# Patient Record
Sex: Female | Born: 1990 | Race: White | Hispanic: No | Marital: Married | Smoking: Never smoker
Health system: Southern US, Community
[De-identification: ages and names within clinical notes are randomized; demographics above are authoritative.]

## PROBLEM LIST (undated history)

## (undated) DIAGNOSIS — I10 Essential (primary) hypertension: Secondary | ICD-10-CM

## (undated) DIAGNOSIS — F429 Obsessive-compulsive disorder, unspecified: Secondary | ICD-10-CM

## (undated) DIAGNOSIS — I701 Atherosclerosis of renal artery: Secondary | ICD-10-CM

## (undated) DIAGNOSIS — J45909 Unspecified asthma, uncomplicated: Secondary | ICD-10-CM

## (undated) DIAGNOSIS — G43909 Migraine, unspecified, not intractable, without status migrainosus: Secondary | ICD-10-CM

## (undated) DIAGNOSIS — E079 Disorder of thyroid, unspecified: Secondary | ICD-10-CM

## (undated) DIAGNOSIS — R451 Restlessness and agitation: Secondary | ICD-10-CM

## (undated) DIAGNOSIS — E119 Type 2 diabetes mellitus without complications: Secondary | ICD-10-CM

## (undated) DIAGNOSIS — F4010 Social phobia, unspecified: Secondary | ICD-10-CM

## (undated) DIAGNOSIS — F4310 Post-traumatic stress disorder, unspecified: Secondary | ICD-10-CM

## (undated) DIAGNOSIS — N261 Atrophy of kidney (terminal): Secondary | ICD-10-CM

## (undated) DIAGNOSIS — E7800 Pure hypercholesterolemia, unspecified: Secondary | ICD-10-CM

## (undated) DIAGNOSIS — F329 Major depressive disorder, single episode, unspecified: Secondary | ICD-10-CM

## (undated) DIAGNOSIS — F32A Depression, unspecified: Secondary | ICD-10-CM

## (undated) DIAGNOSIS — T7840XA Allergy, unspecified, initial encounter: Secondary | ICD-10-CM

## (undated) DIAGNOSIS — N189 Chronic kidney disease, unspecified: Secondary | ICD-10-CM

## (undated) DIAGNOSIS — E039 Hypothyroidism, unspecified: Secondary | ICD-10-CM

## (undated) DIAGNOSIS — F419 Anxiety disorder, unspecified: Secondary | ICD-10-CM

## (undated) HISTORY — DX: Pure hypercholesterolemia, unspecified: E78.00

## (undated) HISTORY — DX: Type 2 diabetes mellitus without complications: E11.9

## (undated) HISTORY — PX: LAPAROSCOPIC REPAIR AND REMOVAL OF GASTRIC BAND: SHX5919

## (undated) HISTORY — DX: Restlessness and agitation: R45.1

## (undated) HISTORY — DX: Migraine, unspecified, not intractable, without status migrainosus: G43.909

## (undated) HISTORY — DX: Social phobia, unspecified: F40.10

## (undated) HISTORY — DX: Obsessive-compulsive disorder, unspecified: F42.9

## (undated) HISTORY — PX: TONSILLECTOMY: SUR1361

## (undated) HISTORY — PX: LAPAROSCOPIC GASTRIC BANDING: SHX1100

## (undated) HISTORY — DX: Post-traumatic stress disorder, unspecified: F43.10

## (undated) HISTORY — PX: CHOLECYSTECTOMY: SHX55

## (undated) HISTORY — DX: Hypothyroidism, unspecified: E03.9

## (undated) HISTORY — DX: Allergy, unspecified, initial encounter: T78.40XA

## (undated) HISTORY — PX: WISDOM TOOTH EXTRACTION: SHX21

## (undated) HISTORY — DX: Chronic kidney disease, unspecified: N18.9

---
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
## 2009-09-01 HISTORY — PX: LAPAROSCOPIC REPAIR AND REMOVAL OF GASTRIC BAND: SHX5919

## 2009-09-01 HISTORY — PX: OTHER SURGICAL HISTORY: SHX169

## 2013-01-22 DIAGNOSIS — R21 Rash and other nonspecific skin eruption: Secondary | ICD-10-CM | POA: Insufficient documentation

## 2013-01-22 DIAGNOSIS — R197 Diarrhea, unspecified: Secondary | ICD-10-CM | POA: Insufficient documentation

## 2013-01-22 DIAGNOSIS — R109 Unspecified abdominal pain: Secondary | ICD-10-CM | POA: Insufficient documentation

## 2015-09-27 DIAGNOSIS — E876 Hypokalemia: Secondary | ICD-10-CM | POA: Insufficient documentation

## 2015-09-27 DIAGNOSIS — E1159 Type 2 diabetes mellitus with other circulatory complications: Secondary | ICD-10-CM | POA: Insufficient documentation

## 2015-09-27 DIAGNOSIS — J189 Pneumonia, unspecified organism: Secondary | ICD-10-CM | POA: Insufficient documentation

## 2015-09-27 DIAGNOSIS — R0902 Hypoxemia: Secondary | ICD-10-CM | POA: Insufficient documentation

## 2015-09-27 DIAGNOSIS — A419 Sepsis, unspecified organism: Secondary | ICD-10-CM | POA: Insufficient documentation

## 2015-09-27 DIAGNOSIS — I152 Hypertension secondary to endocrine disorders: Secondary | ICD-10-CM | POA: Insufficient documentation

## 2015-09-28 DIAGNOSIS — J45901 Unspecified asthma with (acute) exacerbation: Secondary | ICD-10-CM | POA: Insufficient documentation

## 2016-04-11 ENCOUNTER — Emergency Department
Admission: EM | Admit: 2016-04-11 | Discharge: 2016-04-11 | Disposition: A | Discharge status: Home / Self Care | Payer: Self-pay | Attending: Student | Admitting: Student

## 2016-04-11 ENCOUNTER — Encounter: Payer: Self-pay | Admitting: Emergency Medicine

## 2016-04-11 ENCOUNTER — Emergency Department: Payer: Self-pay

## 2016-04-11 DIAGNOSIS — R0789 Other chest pain: Secondary | ICD-10-CM

## 2016-04-11 DIAGNOSIS — R059 Cough, unspecified: Secondary | ICD-10-CM

## 2016-04-11 DIAGNOSIS — M940 Chondrocostal junction syndrome [Tietze]: Secondary | ICD-10-CM | POA: Insufficient documentation

## 2016-04-11 DIAGNOSIS — J45909 Unspecified asthma, uncomplicated: Secondary | ICD-10-CM | POA: Insufficient documentation

## 2016-04-11 DIAGNOSIS — I10 Essential (primary) hypertension: Secondary | ICD-10-CM | POA: Insufficient documentation

## 2016-04-11 DIAGNOSIS — R05 Cough: Secondary | ICD-10-CM

## 2016-04-11 HISTORY — DX: Unspecified asthma, uncomplicated: J45.909

## 2016-04-11 HISTORY — DX: Essential (primary) hypertension: I10

## 2016-04-11 HISTORY — DX: Disorder of thyroid, unspecified: E07.9

## 2016-04-11 LAB — BASIC METABOLIC PANEL
Anion gap: 9 (ref 5–15)
BUN: 16 mg/dL (ref 6–20)
CO2: 26 mmol/L (ref 22–32)
CREATININE: 0.79 mg/dL (ref 0.44–1.00)
Calcium: 9.8 mg/dL (ref 8.9–10.3)
Chloride: 103 mmol/L (ref 101–111)
Glucose, Bld: 120 mg/dL — ABNORMAL HIGH (ref 65–99)
POTASSIUM: 3.7 mmol/L (ref 3.5–5.1)
SODIUM: 138 mmol/L (ref 135–145)

## 2016-04-11 LAB — CBC
HCT: 40.2 % (ref 35.0–47.0)
Hemoglobin: 13.8 g/dL (ref 12.0–16.0)
MCH: 27.7 pg (ref 26.0–34.0)
MCHC: 34.4 g/dL (ref 32.0–36.0)
MCV: 80.6 fL (ref 80.0–100.0)
PLATELETS: 268 10*3/uL (ref 150–440)
RBC: 4.99 MIL/uL (ref 3.80–5.20)
RDW: 13.7 % (ref 11.5–14.5)
WBC: 8.4 10*3/uL (ref 3.6–11.0)

## 2016-04-11 LAB — POCT PREGNANCY, URINE: PREG TEST UR: NEGATIVE

## 2016-04-11 LAB — TROPONIN I: Troponin I: <0.03 ng/mL (ref <0.03)

## 2016-04-11 MED ORDER — IBUPROFEN 400 MG PO TABS
600.0000 mg | ORAL_TABLET | Freq: Once | ORAL | Status: AC
  Administered 2016-04-11: 600 mg via ORAL
  Filled 2016-04-11: qty 2

## 2016-04-11 MED ORDER — IBUPROFEN 600 MG PO TABS: 600.0000 mg | ORAL_TABLET | Freq: Three times a day (TID) | ORAL | 0 refills | Status: DC | PRN

## 2016-04-11 MED ORDER — ASPIRIN 81 MG PO CHEW
CHEWABLE_TABLET | ORAL | Status: AC
  Filled 2016-04-11: qty 4

## 2016-04-11 MED ORDER — ASPIRIN 81 MG PO CHEW
324.0000 mg | CHEWABLE_TABLET | Freq: Once | ORAL | Status: AC
  Administered 2016-04-11: 324 mg via ORAL

## 2016-04-11 NOTE — ED Triage Notes (Signed)
Pt reports centralized chest pain that started around 1300. Pt had hx of asthma, reports she used her inhaler but feels like her chest is tight.

## 2016-04-11 NOTE — ED Notes (Signed)
Patient in radiology.  Radiology informed to bring patient to room 34 when completed x-ray

## 2016-04-11 NOTE — ED Provider Notes (Signed)
[facility] Emergency Department Provider Note   ____________________________________________   First MD Initiated Contact with Patient 04/11/16 1722     (approximate)  I have reviewed the triage vital signs and the nursing notes.   HISTORY  Chief Complaint Chest Pain and Shortness of Breath    HPI Leah Bright is a 25 y.o. female with history of asthma, hypertension, thyroid disease presents for evaluation of constant aching sternal chest pain since 1 PM today, gradual, worse with deep inspiration, moderate, not worse with exertion, nonradiating. Patient reports that the pain began while she was "stressing over finances". She has had cough but it has not been productive, she denies shortness of breath. No fevers or chills. She denies any history of PE or DVT, no exogenous estrogen use, no recent surgeries, no recent prolonged period of immobilization, no hemoptysis. She denies any history of heart disease, she denies family history of early coronary artery disease.   Past Medical History:  Diagnosis Date  . Asthma   . Hypertension   . Thyroid disease     There are no active problems to display for this patient.   No past surgical history on file.  Prior to Admission medications   Not on File    Allergies Codeine; Pepto-bismol [bismuth]; and Toradol [ketorolac tromethamine]  No family history on file.  Social History Social History  Substance Use Topics  . Smoking status: Not on file  . Smokeless tobacco: Not on file  . Alcohol use Not on file    Review of Systems Constitutional: No fever/chills Eyes: No visual changes. ENT: No sore throat. Cardiovascular: + chest pain. Respiratory: Denies shortness of breath. Gastrointestinal: No abdominal pain.  No nausea, no vomiting.  No diarrhea.  No constipation. Genitourinary: Negative for dysuria. Musculoskeletal: Negative for back pain. Skin: Negative for rash. Neurological: Negative for  headaches, focal weakness or numbness.  10-point ROS otherwise negative.  ____________________________________________   PHYSICAL EXAM:  Vitals:   04/11/16 1652 04/11/16 1654 04/11/16 1733  BP:  (!) 159/87 (!) 155/90  Pulse:  84 84  Resp:  20 20  Temp:  98.6 F (37 C)   TempSrc:  Oral   SpO2:  99% 99%  Weight: 250 lb (113.4 kg)    Height:  (1.626 m)      VITAL SIGNS: ED Triage Vitals  Enc Vitals Group     BP 04/11/16 1654 (!) 159/87     Pulse Rate 04/11/16 1654 84     Resp 04/11/16 1654 20     Temp 04/11/16 1654 98.6 F (37 C)     Temp Source 04/11/16 1654 Oral     SpO2 04/11/16 1654 99 %     Weight 04/11/16 1652 250 lb (113.4 kg)     Height 04/11/16 1652  (1.626 m)     Head Circumference --      Peak Flow --      Pain Score 04/11/16 1652 7     Pain Loc --      Pain Edu? --      Excl. in GC? --     Constitutional: Alert and oriented. Well appearing and in no acute distress. Eyes: Conjunctivae are normal. PERRL. EOMI. Head: Atraumatic. Nose: No congestion/rhinnorhea. Mouth/Throat: Mucous membranes are moist.  Oropharynx non-erythematous. Neck: No stridor.  Cardiovascular: Normal rate, regular rhythm. Grossly normal heart sounds.  Good peripheral circulation. Respiratory: Normal respiratory effort.  No retractions. Lungs CTAB. Gastrointestinal: Soft and nontender. No  distention.  No CVA tenderness. Genitourinary: deferred Musculoskeletal: No lower extremity tenderness nor edema.  No joint effusions. Tenderness to palpation throughout the sternum as well as adjacent to the sternum bilaterally, palpation reproduces her pain as does her engagement of the right pectoralis muscles. Neurologic:  Normal speech and language. No gross focal neurologic deficits are appreciated. No gait instability. Skin:  Skin is warm, dry and intact. No rash noted. Psychiatric: Mood and affect are normal. Speech and behavior are  normal.  ____________________________________________   LABS (all labs ordered are listed, but only abnormal results are displayed)  Labs Reviewed  BASIC METABOLIC PANEL - Abnormal; Notable for the following:       Result Value   Glucose, Bld 120 (*)    All other components within normal limits  CBC  TROPONIN I  POCT PREGNANCY, URINE  POC URINE PREG, ED   ____________________________________________  EKG  ED ECG REPORT I, Gayla DossGayle, Lexie Koehl A, the attending physician, personally viewed and interpreted this ECG.   Date: 04/11/2016  EKG Time: 16:53  Rate: 80  Rhythm: normal EKG, normal sinus rhythm  Axis: normal  Intervals:none  ST&T Change: No acute ST elevation or acute ST depression.  ____________________________________________  RADIOLOGY  CXR IMPRESSION: No active cardiopulmonary disease. ____________________________________________   PROCEDURES  Procedure(s) performed: None  Procedures  Critical Care performed: No  ____________________________________________   INITIAL IMPRESSION / ASSESSMENT AND PLAN / ED COURSE  Pertinent labs & imaging results that were available during my care of the patient were reviewed by me and considered in my medical decision making (see chart for details).  Leah CopasKelli Vess is a 25 y.o. female with history of asthma, hypertension, thyroid disease presents for evaluation of constant aching sternal chest pain since 1 PM today, gradual, worse with deep inspiration. On exam, she is very well-appearing and in no acute distress. Vital signs stable, she is afebrile. She has reproducible sternal chest pain worse with palpation as well as engagement of the right pectoralis muscles and I suspect musculoskeletal chest pain, possibly costochondritis suspect there is also an anxiety component. Her EKG is normal which is reassuring, not consistent with ischemia. Troponin negative and pain has been constant for 5 hours. Heart score is 1 and I doubt that  this represent ACS. She is PERC negative and I doubt PE. Pain Not severe, not maximal at onset, not ripping or tearing in nature, not consistent with acute aortic dissection. CBC, BMP unremarkable, negative pregnancy test. Chest x-ray clear. We discussed return precautions, treatment with NSAIDs, need for close PCP follow-up and she is comfortable with the discharge plan. DC home.  Clinical Course     ____________________________________________   FINAL CLINICAL IMPRESSION(S) / ED DIAGNOSES  Final diagnoses:  Chest wall pain  Cough  Costochondritis      NEW MEDICATIONS STARTED DURING THIS VISIT:  New Prescriptions   No medications on file     Note:  This document was prepared using Dragon voice recognition software and may include unintentional dictation errors.    Gayla DossEryka A Vittoria Noreen, MD 04/11/16 [phone]

## 2016-04-20 ENCOUNTER — Encounter: Payer: Self-pay | Admitting: Emergency Medicine

## 2016-04-20 ENCOUNTER — Emergency Department
Admission: EM | Admit: 2016-04-20 | Discharge: 2016-04-20 | Disposition: A | Discharge status: Home / Self Care | Payer: Self-pay | Attending: Emergency Medicine | Admitting: Emergency Medicine

## 2016-04-20 DIAGNOSIS — G43909 Migraine, unspecified, not intractable, without status migrainosus: Secondary | ICD-10-CM | POA: Insufficient documentation

## 2016-04-20 DIAGNOSIS — I10 Essential (primary) hypertension: Secondary | ICD-10-CM | POA: Insufficient documentation

## 2016-04-20 DIAGNOSIS — J45909 Unspecified asthma, uncomplicated: Secondary | ICD-10-CM | POA: Insufficient documentation

## 2016-04-20 MED ORDER — BUTALBITAL-APAP-CAFFEINE 50-325-40 MG PO TABS: 1.0000 | ORAL_TABLET | Freq: Four times a day (QID) | ORAL | 0 refills | Status: AC | PRN

## 2016-04-20 MED ORDER — SODIUM CHLORIDE 0.9 % IV SOLN
1000.0000 mL | Freq: Once | INTRAVENOUS | Status: AC
  Administered 2016-04-20: 1000 mL via INTRAVENOUS

## 2016-04-20 MED ORDER — DIPHENHYDRAMINE HCL 50 MG/ML IJ SOLN
INTRAMUSCULAR | Status: AC
  Administered 2016-04-20: 25 mg via INTRAVENOUS
  Filled 2016-04-20: qty 1

## 2016-04-20 MED ORDER — METOCLOPRAMIDE HCL 5 MG/ML IJ SOLN
20.0000 mg | Freq: Once | INTRAVENOUS | Status: AC
  Administered 2016-04-20: 20 mg via INTRAVENOUS
  Filled 2016-04-20: qty 4

## 2016-04-20 MED ORDER — DIPHENHYDRAMINE HCL 50 MG/ML IJ SOLN
25.0000 mg | Freq: Once | INTRAMUSCULAR | Status: AC
  Administered 2016-04-20: 25 mg via INTRAVENOUS

## 2016-04-20 NOTE — ED Triage Notes (Signed)
Patient reports that she developed a migraine on Thursday. Patient states that she has been taking Excedrin migraine with no relief.

## 2016-04-20 NOTE — ED Provider Notes (Signed)
[facility] Emergency Department Provider Note   ____________________________________________    I have reviewed the triage vital signs and the nursing notes.   HISTORY  Chief Complaint Migraine     HPI Leah Bright is a 25 y.o. female who presents with complaints of headache. Patient reports a history of migraine headaches and today's episode is similar. She has been taking Excedrin without relief. This started 3 days ago. This is typical of her headaches. She denies neuro deficits. She has been having migraines over the last year, neurology follow-up has been arranged. She has had normal CT scan in the past. No fevers or chills or neck pain.   Past Medical History:  Diagnosis Date  . Asthma   . Hypertension   . Thyroid disease     There are no active problems to display for this patient.   Past Surgical History:  Procedure Laterality Date  . CHOLECYSTECTOMY    . LAPAROSCOPIC GASTRIC BANDING    . LAPAROSCOPIC REPAIR AND REMOVAL OF GASTRIC BAND    . TONSILLECTOMY      Prior to Admission medications   Medication Sig Start Date End Date Taking? Authorizing Provider  ibuprofen (ADVIL,MOTRIN) 600 MG tablet Take 1 tablet (600 mg total) by mouth every 8 (eight) hours as needed for moderate pain. 04/11/16   Gayla DossEryka A Gayle, MD     Allergies Codeine; Pepto-bismol [bismuth]; and Toradol [ketorolac tromethamine]  No family history on file.  Social History Social History  Substance Use Topics  . Smoking status: Never Smoker  . Smokeless tobacco: Never Used  . Alcohol use Yes     Comment: occasionally    Review of Systems  Constitutional: No fever/chills Eyes: No visual changes.  ENT: No Neck pain Cardiovascular: Denies chest pain. Respiratory: Denies shortness of breath. Gastrointestinal: Mild nausea Genitourinary: Negative for dysuria. Musculoskeletal: Negative for back pain. Skin: Negative for rash. Neurological: Negative for focal  weakness weakness  10-point ROS otherwise negative.  ____________________________________________   PHYSICAL EXAM:  VITAL SIGNS: ED Triage Vitals  Enc Vitals Group     BP 04/20/16 2113 (!) 150/85     Pulse Rate 04/20/16 2113 77     Resp 04/20/16 2113 18     Temp 04/20/16 2113 98.3 F (36.8 C)     Temp Source 04/20/16 2113 Oral     SpO2 04/20/16 2113 98 %     Weight 04/20/16 2114 270 lb (122.5 kg)     Height 04/20/16 2114 5\' 5"  (1.651 m)     Head Circumference --      Peak Flow --      Pain Score 04/20/16 2117 9     Pain Loc --      Pain Edu? --      Excl. in GC? --     Constitutional: Alert and oriented. No acute distress. Uncomfortable appearing Eyes: Conjunctivae are normal. EOMI, PERRLA Head: Atraumatic. Nose: No congestion/rhinnorhea. Mouth/Throat: Mucous membranes are moist.   Neck:  Painless ROM, no vertebral tenderness palpation Cardiovascular: Normal rate, regular rhythm. Grossly normal heart sounds.  Good peripheral circulation. Respiratory: Normal respiratory effort.  No retractions. Lungs CTAB. Gastrointestinal: Soft and nontender. No distention.  No CVA tenderness. Genitourinary: deferred Musculoskeletal: No lower extremity tenderness nor edema.  Warm and well perfused Neurologic:  Normal speech and language. No gross focal neurologic deficits are appreciated.  Skin:  Skin is warm, dry and intact. No rash noted. Psychiatric: Mood and affect are normal. Speech  and behavior are normal.  ____________________________________________   LABS (all labs ordered are listed, but only abnormal results are displayed)  Labs Reviewed - No data to display ____________________________________________  EKG  None ____________________________________________  RADIOLOGY  None ____________________________________________   PROCEDURES  Procedure(s) performed: No    Critical Care performed: No ____________________________________________   INITIAL  IMPRESSION / ASSESSMENT AND PLAN / ED COURSE  Pertinent labs & imaging results that were available during my care of the patient were reviewed by me and considered in my medical decision making (see chart for details).  Patient presents with her typical migraine headache. We will treat with IV Reglan and IV Benadryl. She is apparently allergic to Toradol. IV fluids started as well. Do not feel imaging is necessary at this time as this is consistent with her usual symptoms.  Clinical Course  ----------------------------------------- 10:24 PM on 04/20/2016 -----------------------------------------  Patient reports headache is "much better" she is anxious to go home and get in bed. We discussed return precautions. ____________________________________________   FINAL CLINICAL IMPRESSION(S) / ED DIAGNOSES  Final diagnoses:  Migraine without status migrainosus, not intractable, unspecified migraine type      NEW MEDICATIONS STARTED DURING THIS VISIT:  New Prescriptions   No medications on file     Note:  This document was prepared using Dragon voice recognition software and may include unintentional dictation errors.    Jene Everyobert Fortune Brannigan, MD 04/20/16 2225

## 2016-05-15 ENCOUNTER — Emergency Department: Payer: Self-pay

## 2016-05-15 ENCOUNTER — Emergency Department
Admission: EM | Admit: 2016-05-15 | Discharge: 2016-05-15 | Disposition: A | Discharge status: Home / Self Care | Payer: Self-pay | Attending: Emergency Medicine | Admitting: Emergency Medicine

## 2016-05-15 ENCOUNTER — Encounter: Payer: Self-pay | Admitting: Emergency Medicine

## 2016-05-15 DIAGNOSIS — J45909 Unspecified asthma, uncomplicated: Secondary | ICD-10-CM | POA: Insufficient documentation

## 2016-05-15 DIAGNOSIS — I10 Essential (primary) hypertension: Secondary | ICD-10-CM | POA: Insufficient documentation

## 2016-05-15 DIAGNOSIS — J069 Acute upper respiratory infection, unspecified: Secondary | ICD-10-CM | POA: Insufficient documentation

## 2016-05-15 DIAGNOSIS — Z79899 Other long term (current) drug therapy: Secondary | ICD-10-CM | POA: Insufficient documentation

## 2016-05-15 HISTORY — DX: Atherosclerosis of renal artery: I70.1

## 2016-05-15 LAB — BASIC METABOLIC PANEL
ANION GAP: 6 (ref 5–15)
BUN: 9 mg/dL (ref 6–20)
CALCIUM: 9.2 mg/dL (ref 8.9–10.3)
CO2: 27 mmol/L (ref 22–32)
Chloride: 109 mmol/L (ref 101–111)
Creatinine, Ser: 0.7 mg/dL (ref 0.44–1.00)
GLUCOSE: 149 mg/dL — AB (ref 65–99)
POTASSIUM: 3.8 mmol/L (ref 3.5–5.1)
SODIUM: 142 mmol/L (ref 135–145)

## 2016-05-15 LAB — CBC
HCT: 37.2 % (ref 35.0–47.0)
HEMOGLOBIN: 13 g/dL (ref 12.0–16.0)
MCH: 28.2 pg (ref 26.0–34.0)
MCHC: 34.9 g/dL (ref 32.0–36.0)
MCV: 80.7 fL (ref 80.0–100.0)
Platelets: 208 10*3/uL (ref 150–440)
RBC: 4.61 MIL/uL (ref 3.80–5.20)
RDW: 14.3 % (ref 11.5–14.5)
WBC: 4.1 10*3/uL (ref 3.6–11.0)

## 2016-05-15 LAB — TROPONIN I

## 2016-05-15 LAB — POCT PREGNANCY, URINE: PREG TEST UR: NEGATIVE

## 2016-05-15 LAB — POCT RAPID STREP A: Streptococcus, Group A Screen (Direct): NEGATIVE

## 2016-05-15 MED ORDER — DEXAMETHASONE SODIUM PHOSPHATE 10 MG/ML IJ SOLN
10.0000 mg | Freq: Once | INTRAMUSCULAR | Status: AC
  Administered 2016-05-15: 10 mg via INTRAMUSCULAR
  Filled 2016-05-15: qty 1

## 2016-05-15 MED ORDER — IPRATROPIUM-ALBUTEROL 0.5-2.5 (3) MG/3ML IN SOLN
3.0000 mL | Freq: Once | RESPIRATORY_TRACT | Status: AC
  Administered 2016-05-15: 3 mL via RESPIRATORY_TRACT
  Filled 2016-05-15: qty 3

## 2016-05-15 MED ORDER — AZITHROMYCIN 250 MG PO TABS: ORAL_TABLET | ORAL | 0 refills | Status: AC

## 2016-05-15 MED ORDER — ALBUTEROL SULFATE (2.5 MG/3ML) 0.083% IN NEBU: 2.5000 mg | INHALATION_SOLUTION | Freq: Four times a day (QID) | RESPIRATORY_TRACT | 12 refills | Status: DC | PRN

## 2016-05-15 NOTE — ED Triage Notes (Signed)
Pt presents to ED with c/o cough, chest pain, sore throat. Pt states has been going on for 2-3 days at this time. Pt is alert and oriented, noted to have a dry, nonproductive cough at this time. Breath sounds clear at this time.

## 2016-05-15 NOTE — Discharge Instructions (Signed)
Please seek medical attention for any high fevers, chest pain, shortness of breath, change in behavior, persistent vomiting, bloody stool or any other new or concerning symptoms.  

## 2016-05-15 NOTE — ED Notes (Signed)
Discharge instructions reviewed with patient. Questions fielded by this RN. Patient verbalizes understanding of instructions. Patient discharged home in stable condition per Goodman MD . No acute distress noted at time of discharge.   

## 2016-05-15 NOTE — ED Notes (Signed)
Pt states fever cough sob since Tuesday and cp started today. Pt is breathing even and unlabored. Pt lungs clear. Will continue to monitor.

## 2016-05-15 NOTE — ED Provider Notes (Signed)
[facility] Emergency Department Provider Note   ____________________________________________   I have reviewed the triage vital signs and the nursing notes.   HISTORY  Chief Complaint Cough; Chest Pain; and Sore Throat   History limited by: Not Limited   HPI Leah CopasKelli Vess is a 25 y.o. female who presents to the emergency department today with complaints of sore throat, shortness breath and cough.The symptoms have been present for the past few days. She states her throat feels like she swallowed a bunch of knives. Pain is sharp. She additionally has had a dry nonproductive cough and shortness of breath. The patient feels like she might have had some fevers. She has tried over-the-counter cough and cold medication without great relief.   Past Medical History:  Diagnosis Date  . Asthma   . Hypertension   . Renal artery stenosis (HCC)   . Thyroid disease     There are no active problems to display for this patient.   Past Surgical History:  Procedure Laterality Date  . CHOLECYSTECTOMY    . LAPAROSCOPIC GASTRIC BANDING    . LAPAROSCOPIC REPAIR AND REMOVAL OF GASTRIC BAND    . TONSILLECTOMY      Prior to Admission medications   Medication Sig Start Date End Date Taking? Authorizing Provider  butalbital-acetaminophen-caffeine (FIORICET) [phone] MG tablet Take 1-2 tablets by mouth every 6 (six) hours as needed for headache. 04/20/16 04/20/17  Jene Everyobert Kinner, MD  ibuprofen (ADVIL,MOTRIN) 600 MG tablet Take 1 tablet (600 mg total) by mouth every 8 (eight) hours as needed for moderate pain. 04/11/16   Gayla DossEryka A Gayle, MD    Allergies Codeine; Pepto-bismol [bismuth]; and Toradol [ketorolac tromethamine]  No family history on file.  Social History Social History  Substance Use Topics  . Smoking status: Never Smoker  . Smokeless tobacco: Never Used  . Alcohol use Yes     Comment: occasionally    Review of Systems  Constitutional: Negative for  fever. Cardiovascular: Negative for chest pain. Respiratory: Positive for shortness of breath. Gastrointestinal: Negative for abdominal pain, vomiting and diarrhea. Genitourinary: Negative for dysuria. Musculoskeletal: Negative for back pain. Skin: Negative for rash. Neurological: Negative for headaches, focal weakness or numbness.  10-point ROS otherwise negative.  ____________________________________________   PHYSICAL EXAM:  VITAL SIGNS: ED Triage Vitals  Enc Vitals Group     BP 05/15/16 1732 (!) 153/93     Pulse Rate 05/15/16 1732 78     Resp 05/15/16 1732 19     Temp 05/15/16 1732 99 F (37.2 C)     Temp Source 05/15/16 1732 Oral     SpO2 05/15/16 1732 98 %     Weight 05/15/16 1738 268 lb (121.6 kg)     Height 05/15/16 1738 5\' 3"  (1.6 m)     Head Circumference --      Peak Flow --      Pain Score 05/15/16 1746 7   Constitutional: Alert and oriented. Well appearing and in no distress. Eyes: Conjunctivae are normal. Normal extraocular movements. ENT   Head: Normocephalic and atraumatic.   Nose: No congestion/rhinnorhea.   Mouth/Throat: Mucous membranes are moist. Mild pharyngeal erythema without any exudates or swelling appreciated.    Neck: No stridor. Hematological/Lymphatic/Immunilogical: No cervical lymphadenopathy. Cardiovascular: Normal rate, regular rhythm.  No murmurs, rubs, or gallops. Respiratory: Normal respiratory effort without tachypnea nor retractions. Breath sounds are clear and equal bilaterally. No wheezes/rales/rhonchi. Gastrointestinal: Soft and nontender. No distention.  Genitourinary: Deferred Musculoskeletal: Normal range of motion  in all extremities. No lower extremity edema. Neurologic:  Normal speech and language. No gross focal neurologic deficits are appreciated.  Skin:  Skin is warm, dry and intact. No rash noted. Psychiatric: Mood and affect are normal. Speech and behavior are normal. Patient exhibits appropriate insight and  judgment.  ____________________________________________    LABS (pertinent positives/negatives)  Labs Reviewed  BASIC METABOLIC PANEL - Abnormal; Notable for the following:       Result Value   Glucose, Bld 149 (*)    All other components within normal limits  CBC  TROPONIN I  POCT RAPID STREP A  POCT PREGNANCY, URINE    ____________________________________________   EKG  I, Phineas Semen, attending physician, personally viewed and interpreted this EKG  EKG Time: 1737 Rate: 79 Rhythm: normal sinus rhythm Axis: normal Intervals: qtc 428 QRS: narrow ST changes: no st elevation Impression: normal ekg   ____________________________________________    RADIOLOGY  CXR IMPRESSION:  No active cardiopulmonary disease.     I, Abigayle Wilinski, personally viewed and evaluated these images (plain radiographs) as part of my medical decision making. ____________________________________________   PROCEDURES  Procedures  ____________________________________________   INITIAL IMPRESSION / ASSESSMENT AND PLAN / ED COURSE  Pertinent labs & imaging results that were available during my care of the patient were reviewed by me and considered in my medical decision making (see chart for details).  Patient presented to the emergency department today with concerns for symptoms are likely related to upper respiratory illness. Workup here without any concerning positives. Patient did feel better after DuoNeb treatment. Will give patient her sugars and for albuterol nebulizer solution. Additionally will give patient. Course of azithromycin. ____________________________________________   FINAL CLINICAL IMPRESSION(S) / ED DIAGNOSES  Final diagnoses:  URI (upper respiratory infection)     Note: This dictation was prepared with Dragon dictation. Any transcriptional errors that result from this process are unintentional    Phineas Semen, MD 05/15/16 2221

## 2016-06-12 ENCOUNTER — Encounter: Payer: Self-pay | Admitting: Emergency Medicine

## 2016-06-12 ENCOUNTER — Emergency Department
Admission: EM | Admit: 2016-06-12 | Discharge: 2016-06-12 | Disposition: A | Discharge status: Home / Self Care | Payer: Self-pay | Attending: Emergency Medicine | Admitting: Emergency Medicine

## 2016-06-12 DIAGNOSIS — J45909 Unspecified asthma, uncomplicated: Secondary | ICD-10-CM | POA: Insufficient documentation

## 2016-06-12 DIAGNOSIS — R519 Headache, unspecified: Secondary | ICD-10-CM

## 2016-06-12 DIAGNOSIS — R51 Headache: Secondary | ICD-10-CM

## 2016-06-12 DIAGNOSIS — I10 Essential (primary) hypertension: Secondary | ICD-10-CM | POA: Insufficient documentation

## 2016-06-12 DIAGNOSIS — M5481 Occipital neuralgia: Secondary | ICD-10-CM | POA: Insufficient documentation

## 2016-06-12 MED ORDER — MAGNESIUM SULFATE 2 GM/50ML IV SOLN
2.0000 g | Freq: Once | INTRAVENOUS | Status: AC
  Administered 2016-06-12: 2 g via INTRAVENOUS
  Filled 2016-06-12 (×2): qty 50

## 2016-06-12 MED ORDER — SODIUM CHLORIDE 0.9 % IV BOLUS (SEPSIS)
1000.0000 mL | Freq: Once | INTRAVENOUS | Status: AC
  Administered 2016-06-12: 1000 mL via INTRAVENOUS

## 2016-06-12 MED ORDER — PROCHLORPERAZINE EDISYLATE 5 MG/ML IJ SOLN
10.0000 mg | Freq: Once | INTRAMUSCULAR | Status: AC
  Administered 2016-06-12: 10 mg via INTRAVENOUS
  Filled 2016-06-12 (×2): qty 2

## 2016-06-12 NOTE — ED Triage Notes (Signed)
Patient presents to the ED with head pain to the top right side of her head since 2am.  Patient states yesterday evening she reports slight chest pain and 1 episode of vomiting.  Patient denies vomiting or chest pain today.  Patient reports slight blurry vision with head pain and reports tenderness to the area when she touches it.  Patient denies nausea at this time.  Patient reports history of migraines but states that this feels different.

## 2016-06-12 NOTE — ED Provider Notes (Signed)
[facility] Emergency Department Provider Note   ____________________________________________   First MD Initiated Contact with Patient 06/12/16 1404     (approximate)  I have reviewed the triage vital signs and the nursing notes.   HISTORY  Chief Complaint Headache   HPI Leah Bright is a 25 y.o. female with a history of thyroid disease as well as migraine headaches was presenting with a right posterior headache today. She says that this started last night at 2 AM and is an 8 out of 10 pain. She says that it is tender to touch and is associated with flashing lights in her eyes bilaterally. She says it is an aching pain but interrupted with occasional shooting pains that seemed to radiate out from behind her right ear. She denies any sudden onset/thunderclap quality to this headache. Has tried Fioricet at home without relief. Also said that she had an episode of vomiting yesterday 1 that was followed by a short duration of burning chest pain which was relieved with Tums.   Past Medical History:  Diagnosis Date  . Asthma   . Hypertension   . Renal artery stenosis (HCC)   . Thyroid disease     There are no active problems to display for this patient.   Past Surgical History:  Procedure Laterality Date  . CHOLECYSTECTOMY    . LAPAROSCOPIC GASTRIC BANDING    . LAPAROSCOPIC REPAIR AND REMOVAL OF GASTRIC BAND    . TONSILLECTOMY      Prior to Admission medications   Medication Sig Start Date End Date Taking? Authorizing Provider  albuterol (PROVENTIL) (2.5 MG/3ML) 0.083% nebulizer solution Take 3 mLs (2.5 mg total) by nebulization every 6 (six) hours as needed for wheezing or shortness of breath. 05/15/16   Phineas SemenGraydon Goodman, MD  butalbital-acetaminophen-caffeine (FIORICET) [phone] MG tablet Take 1-2 tablets by mouth every 6 (six) hours as needed for headache. 04/20/16 04/20/17  Jene Everyobert Kinner, MD  ibuprofen (ADVIL,MOTRIN) 600 MG tablet Take 1 tablet (600 mg  total) by mouth every 8 (eight) hours as needed for moderate pain. 04/11/16   Gayla DossEryka A Gayle, MD    Allergies Codeine; Pepto-bismol [bismuth]; and Toradol [ketorolac tromethamine]  No family history on file.  Social History Social History  Substance Use Topics  . Smoking status: Never Smoker  . Smokeless tobacco: Never Used  . Alcohol use Yes     Comment: occasionally    Review of Systems Constitutional: No fever/chills Eyes: as above ENT: No sore throat. Cardiovascular: as above Respiratory: Denies shortness of breath. Gastrointestinal: No abdominal pain.   No diarrhea.  No constipation. Genitourinary: Negative for dysuria. Musculoskeletal: Negative for back pain. Skin: Negative for rash. Neurological: Negative for focal weakness or numbness.  10-point ROS otherwise negative.  ____________________________________________   PHYSICAL EXAM:  VITAL SIGNS: ED Triage Vitals  Enc Vitals Group     BP 06/12/16 1333 (!) 145/85     Pulse Rate 06/12/16 1332 78     Resp 06/12/16 1332 20     Temp 06/12/16 1332 98.7 F (37.1 C)     Temp Source 06/12/16 1332 Oral     SpO2 06/12/16 1332 98 %     Weight 06/12/16 1333 268 lb (121.6 kg)     Height 06/12/16 1333 5\' 5"  (1.651 m)     Head Circumference --      Peak Flow --      Pain Score 06/12/16 1332 7     Pain Loc --  Pain Edu? --      Excl. in GC? --     Constitutional: Alert and oriented. Well appearing and in no acute distress. Eyes: Conjunctivae are normal. PERRL. EOMI. Head: Atraumatic.Mild tenderness to palpation along the distribution of the right occipital nerve. Nose: No congestion/rhinnorhea. Mouth/Throat: Mucous membranes are moist.   Neck: No stridor.   Cardiovascular: Normal rate, regular rhythm. Grossly normal heart sounds.   Respiratory: Normal respiratory effort.  No retractions. Lungs CTAB. Gastrointestinal: Soft and nontender. No distention.  Musculoskeletal: No lower extremity tenderness nor edema.   No joint effusions. Neurologic:  Normal speech and language. No gross focal neurologic deficits are appreciated. No gait instability. Skin:  Skin is warm, dry and intact. No rash noted. Psychiatric: Mood and affect are normal. Speech and behavior are normal.  ____________________________________________   LABS (all labs ordered are listed, but only abnormal results are displayed)  Labs Reviewed - No data to display ____________________________________________  EKG   ____________________________________________  RADIOLOGY   ____________________________________________   PROCEDURES  Procedure(s) performed:   Procedures  Critical Care performed:   ____________________________________________   INITIAL IMPRESSION / ASSESSMENT AND PLAN / ED COURSE  Pertinent labs & imaging results that were available during my care of the patient were reviewed by me and considered in my medical decision making (see chart for details).  Headache with migrainous qualities but also occipital nerve pain. Chest pain likely related to reflux status post vomiting. Denies any chest pain at this time.  Clinical Course   ----------------------------------------- 4:41 PM on 06/12/2016 -----------------------------------------  Patient says the pain is now 2 or 3 and feels much improved this time but still says it is sensitive to touch. Suspecting occipital neuralgia. The patient says that she is trying to get in to see neurology at this time with [facility] charity care but has not been able to obtain a neurologist yet. I will refer her to neurology here at [facility] She'll be discharged home. Headache is mild and with history of headaches. Unlikely to be primary cephalgia.  ____________________________________________   FINAL CLINICAL IMPRESSION(S) / ED DIAGNOSES  Headache. Occipital neuralgia.    NEW MEDICATIONS STARTED DURING THIS VISIT:  New Prescriptions   No medications on file      Note:  This document was prepared using Dragon voice recognition software and may include unintentional dictation errors.    Myrna Blazer, MD 06/12/16 [phone]

## 2016-07-20 ENCOUNTER — Encounter: Payer: Self-pay | Admitting: Emergency Medicine

## 2016-07-20 ENCOUNTER — Emergency Department
Admission: EM | Admit: 2016-07-20 | Discharge: 2016-07-20 | Disposition: A | Discharge status: Home / Self Care | Payer: Self-pay | Attending: Emergency Medicine | Admitting: Emergency Medicine

## 2016-07-20 DIAGNOSIS — I10 Essential (primary) hypertension: Secondary | ICD-10-CM | POA: Insufficient documentation

## 2016-07-20 DIAGNOSIS — Z79899 Other long term (current) drug therapy: Secondary | ICD-10-CM | POA: Insufficient documentation

## 2016-07-20 DIAGNOSIS — B9789 Other viral agents as the cause of diseases classified elsewhere: Secondary | ICD-10-CM

## 2016-07-20 DIAGNOSIS — R030 Elevated blood-pressure reading, without diagnosis of hypertension: Secondary | ICD-10-CM

## 2016-07-20 DIAGNOSIS — J069 Acute upper respiratory infection, unspecified: Secondary | ICD-10-CM | POA: Insufficient documentation

## 2016-07-20 DIAGNOSIS — J45909 Unspecified asthma, uncomplicated: Secondary | ICD-10-CM | POA: Insufficient documentation

## 2016-07-20 LAB — POCT RAPID STREP A: Streptococcus, Group A Screen (Direct): NEGATIVE

## 2016-07-20 MED ORDER — BENZONATATE 100 MG PO CAPS: ORAL_CAPSULE | ORAL | 0 refills | Status: DC

## 2016-07-20 MED ORDER — IPRATROPIUM-ALBUTEROL 0.5-2.5 (3) MG/3ML IN SOLN
3.0000 mL | Freq: Once | RESPIRATORY_TRACT | Status: AC
  Administered 2016-07-20: 3 mL via RESPIRATORY_TRACT
  Filled 2016-07-20: qty 3

## 2016-07-20 MED ORDER — ALBUTEROL SULFATE HFA 108 (90 BASE) MCG/ACT IN AERS: 2.0000 | INHALATION_SPRAY | Freq: Four times a day (QID) | RESPIRATORY_TRACT | 2 refills | Status: DC | PRN

## 2016-07-20 NOTE — ED Provider Notes (Signed)
[facility] Emergency Department Provider Note  ____________________________________________   First MD Initiated Contact with Patient 07/20/16 1047     (approximate)  I have reviewed the triage vital signs and the nursing notes.   HISTORY  Chief Complaint Sore Throat    HPI Leah Bright is a 25 y.o. female is here with complaint of sore throat and congestion for approximately one week. Patient states she has "a low-grade fever". Patient has not actually taken his her temperature at home but is felt that way. She also has a history of asthma and has been unable to use her inhaler because her dog chewed it up one week ago. Patient continues to have normal activity and eating. Cough has been nonproductive. She denies any nausea, vomiting or diarrhea. Patient is taken over-the-counter medication without relief of her cough. Patient denies being a smoker.   Past Medical History:  Diagnosis Date  . Asthma   . Hypertension   . Renal artery stenosis (HCC)   . Thyroid disease     There are no active problems to display for this patient.   Past Surgical History:  Procedure Laterality Date  . CHOLECYSTECTOMY    . LAPAROSCOPIC GASTRIC BANDING    . LAPAROSCOPIC REPAIR AND REMOVAL OF GASTRIC BAND    . TONSILLECTOMY      Prior to Admission medications   Medication Sig Start Date End Date Taking? Authorizing Provider  busPIRone (BUSPAR) 10 MG tablet Take 10 mg by mouth 3 (three) times daily.   Yes Historical Provider, MD  levothyroxine (SYNTHROID, LEVOTHROID) 100 MCG tablet Take 100 mcg by mouth daily before breakfast.   Yes Historical Provider, MD  lisinopril (PRINIVIL,ZESTRIL) 40 MG tablet Take 40 mg by mouth daily.   Yes Historical Provider, MD  LORazepam (ATIVAN) 0.5 MG tablet Take 0.5 mg by mouth every 8 (eight) hours as needed for anxiety.   Yes Historical Provider, MD  albuterol (PROVENTIL HFA;VENTOLIN HFA) 108 (90 Base) MCG/ACT inhaler Inhale 2 puffs  into the lungs every 6 (six) hours as needed for wheezing or shortness of breath. 07/20/16   Tommi Rumpshonda L Summers, PA-C  benzonatate (TESSALON PERLES) 100 MG capsule Take one or 2 tablets every 8 hours for coughing. 07/20/16   Tommi Rumpshonda L Summers, PA-C  butalbital-acetaminophen-caffeine (FIORICET) [phone] MG tablet Take 1-2 tablets by mouth every 6 (six) hours as needed for headache. 04/20/16 04/20/17  Jene Everyobert Kinner, MD    Allergies Codeine; Pepto-bismol [bismuth]; and Toradol [ketorolac tromethamine]  No family history on file.  Social History Social History  Substance Use Topics  . Smoking status: Never Smoker  . Smokeless tobacco: Never Used  . Alcohol use Yes     Comment: occasionally    Review of Systems Constitutional:Subjective fever/chills Eyes: No visual changes. ENT: Positive sore throat. Cardiovascular: Denies chest pain. Positive for cough. Respiratory: Denies shortness of breath. Gastrointestinal: No abdominal pain.  No nausea, no vomiting.  No diarrhea.  No constipation. Musculoskeletal: Negative for back pain. Skin: Negative for rash. Neurological: Negative for headaches, focal weakness or numbness.  10-point ROS otherwise negative.  ____________________________________________   PHYSICAL EXAM:  VITAL SIGNS: ED Triage Vitals  Enc Vitals Group     BP      Pulse      Resp      Temp      Temp src      SpO2      Weight      Height      Head  Circumference      Peak Flow      Pain Score      Pain Loc      Pain Edu?      Excl. in GC?     Constitutional: Alert and oriented. Well appearing and in no acute distress. Eyes: Conjunctivae are normal. PERRL. EOMI. Head: Atraumatic. Nose: No congestion/rhinnorhea.  EACs and TMs are clear bilaterally. Mouth/Throat: Mucous membranes are moist.  Oropharynx non-erythematous. Positive posterior drainage. Neck: No stridor.   Hematological/Lymphatic/Immunilogical: No cervical lymphadenopathy. Cardiovascular: Normal  rate, regular rhythm. Grossly normal heart sounds.  Good peripheral circulation. Respiratory: Normal respiratory effort.  No retractions. Lungs CTAB. Gastrointestinal: Soft and nontender. No distention. Musculoskeletal: Moves upper and lower extremities without any difficulty. Normal gait was noted. Neurologic:  Normal speech and language. No gross focal neurologic deficits are appreciated. No gait instability. Skin:  Skin is warm, dry and intact. No rash noted. Psychiatric: Mood and affect are normal. Speech and behavior are normal.  ____________________________________________   LABS (all labs ordered are listed, but only abnormal results are displayed)  Labs Reviewed  POCT RAPID STREP A     PROCEDURES  Procedure(s) performed: None  Procedures  Critical Care performed: No  ____________________________________________   INITIAL IMPRESSION / ASSESSMENT AND PLAN / ED COURSE  Pertinent labs & imaging results that were available during my care of the patient were reviewed by me and considered in my medical decision making (see chart for details).    Clinical Course    Patient had an albuterol nebulizer treatment and improved somewhat while in the emergency room. No wheezing was heard and cough remained nonproductive. Patient was cautioned about her blood pressure and that she needs to have it rechecked to assess whether or not she needs to be on blood pressure medication. [facility] as she does not have a PCP. Patient was given a prescription for Tessalon Perles one or 2 every 8 hours as needed for cough and a albuterol inhaler.  ____________________________________________   FINAL CLINICAL IMPRESSION(S) / ED DIAGNOSES  Final diagnoses:  Viral URI with cough  Elevated blood pressure reading      NEW MEDICATIONS STARTED DURING THIS VISIT:  New Prescriptions   ALBUTEROL (PROVENTIL HFA;VENTOLIN HFA) 108 (90 BASE) MCG/ACT INHALER    Inhale 2  puffs into the lungs every 6 (six) hours as needed for wheezing or shortness of breath.   BENZONATATE (TESSALON PERLES) 100 MG CAPSULE    Take one or 2 tablets every 8 hours for coughing.     Note:  This document was prepared using Dragon voice recognition software and may include unintentional dictation errors.    Tommi RumpsRhonda L Summers, PA-C 07/20/16 1235    Myrna Blazeravid Matthew Schaevitz, MD 07/20/16 [phone]

## 2016-07-20 NOTE — ED Triage Notes (Signed)
Sore throat and chest congestion for about 1 week   Low grade fever

## 2016-07-20 NOTE — ED Notes (Signed)
NAD noted at time of D/C. Pt denies questions or concerns. Pt ambulatory to the lobby at this time.  

## 2016-07-20 NOTE — Discharge Instructions (Addendum)
Follow-up with [facility] acute-[facility] if any continued problems. Also need to go there to have your blood pressure rechecked. Your blood pressure on today's visit in triage was elevated at 165/106. Use Tessalon as needed for cough. Albuterol as needed for wheezing.

## 2016-09-11 ENCOUNTER — Emergency Department
Admission: EM | Admit: 2016-09-11 | Discharge: 2016-09-11 | Disposition: A | Discharge status: Home / Self Care | Payer: Self-pay | Attending: Emergency Medicine | Admitting: Emergency Medicine

## 2016-09-11 ENCOUNTER — Encounter: Payer: Self-pay | Admitting: Emergency Medicine

## 2016-09-11 DIAGNOSIS — R197 Diarrhea, unspecified: Secondary | ICD-10-CM | POA: Insufficient documentation

## 2016-09-11 DIAGNOSIS — J45909 Unspecified asthma, uncomplicated: Secondary | ICD-10-CM | POA: Insufficient documentation

## 2016-09-11 DIAGNOSIS — R110 Nausea: Secondary | ICD-10-CM | POA: Insufficient documentation

## 2016-09-11 DIAGNOSIS — R1030 Lower abdominal pain, unspecified: Secondary | ICD-10-CM | POA: Insufficient documentation

## 2016-09-11 DIAGNOSIS — I10 Essential (primary) hypertension: Secondary | ICD-10-CM | POA: Insufficient documentation

## 2016-09-11 LAB — COMPREHENSIVE METABOLIC PANEL
ALBUMIN: 4 g/dL (ref 3.5–5.0)
ALT: 72 U/L — ABNORMAL HIGH (ref 14–54)
ANION GAP: 9 (ref 5–15)
AST: 68 U/L — AB (ref 15–41)
Alkaline Phosphatase: 87 U/L (ref 38–126)
BILIRUBIN TOTAL: 0.6 mg/dL (ref 0.3–1.2)
BUN: 10 mg/dL (ref 6–20)
CHLORIDE: 108 mmol/L (ref 101–111)
CO2: 23 mmol/L (ref 22–32)
Calcium: 9.6 mg/dL (ref 8.9–10.3)
Creatinine, Ser: 0.7 mg/dL (ref 0.44–1.00)
GFR calc Af Amer: >60 mL/min (ref >60)
GFR calc non Af Amer: >60 mL/min (ref >60)
GLUCOSE: 114 mg/dL — AB (ref 65–99)
POTASSIUM: 4 mmol/L (ref 3.5–5.1)
SODIUM: 140 mmol/L (ref 135–145)
Total Protein: 7 g/dL (ref 6.5–8.1)

## 2016-09-11 LAB — URINALYSIS, COMPLETE (UACMP) WITH MICROSCOPIC
Bilirubin Urine: NEGATIVE
Glucose, UA: NEGATIVE mg/dL
HGB URINE DIPSTICK: NEGATIVE
Ketones, ur: NEGATIVE mg/dL
Leukocytes, UA: NEGATIVE
Nitrite: NEGATIVE
Protein, ur: NEGATIVE mg/dL
SPECIFIC GRAVITY, URINE: 1.018 (ref 1.005–1.030)
pH: 5 (ref 5.0–8.0)

## 2016-09-11 LAB — CBC
HEMATOCRIT: 40.3 % (ref 35.0–47.0)
HEMOGLOBIN: 13.7 g/dL (ref 12.0–16.0)
MCH: 27.3 pg (ref 26.0–34.0)
MCHC: 34.1 g/dL (ref 32.0–36.0)
MCV: 80.2 fL (ref 80.0–100.0)
Platelets: 262 10*3/uL (ref 150–440)
RBC: 5.02 MIL/uL (ref 3.80–5.20)
RDW: 13.1 % (ref 11.5–14.5)
WBC: 6 10*3/uL (ref 3.6–11.0)

## 2016-09-11 LAB — LIPASE, BLOOD: LIPASE: 18 U/L (ref 11–51)

## 2016-09-11 LAB — POCT PREGNANCY, URINE: PREG TEST UR: NEGATIVE

## 2016-09-11 MED ORDER — LOPERAMIDE HCL 2 MG PO TABS: 2.0000 mg | ORAL_TABLET | Freq: Four times a day (QID) | ORAL | 0 refills | Status: DC | PRN

## 2016-09-11 MED ORDER — SODIUM CHLORIDE 0.9 % IV BOLUS (SEPSIS)
1000.0000 mL | Freq: Once | INTRAVENOUS | Status: AC
  Administered 2016-09-11: 1000 mL via INTRAVENOUS

## 2016-09-11 MED ORDER — LOPERAMIDE HCL 2 MG PO CAPS
4.0000 mg | ORAL_CAPSULE | Freq: Once | ORAL | Status: AC
  Administered 2016-09-11: 4 mg via ORAL
  Filled 2016-09-11: qty 2

## 2016-09-11 MED ORDER — TRAMADOL HCL 50 MG PO TABS: 50.0000 mg | ORAL_TABLET | Freq: Four times a day (QID) | ORAL | 0 refills | Status: DC | PRN

## 2016-09-11 MED ORDER — PROMETHAZINE HCL 25 MG/ML IJ SOLN
25.0000 mg | Freq: Once | INTRAMUSCULAR | Status: AC
  Administered 2016-09-11: 25 mg via INTRAVENOUS
  Filled 2016-09-11: qty 1

## 2016-09-11 NOTE — ED Triage Notes (Signed)
Pt with abd pain and diarrhea

## 2016-09-11 NOTE — Discharge Instructions (Signed)
As we discussed please return to the emergency department for any worsening abdominal pain, fever, or any other symptom personally concerning to yourself.

## 2016-09-11 NOTE — ED Provider Notes (Signed)
[facility] Emergency Department Provider Note  Time seen: 3:09 PM  I have reviewed the triage vital signs and the nursing notes.   HISTORY  Chief Complaint Abdominal Pain    HPI Leah Bright is a 26 y.o. female with a past medical history of hypertension, asthma who presents to the emergency department for abdominal discomfort and diarrhea. According to the patient since yesterday she has been experiencing several episodes of diarrhea per day. Patient states abdominal cramping. States some nausea but denies vomiting. Denies fever. Patient took a dose of loperamide yesterday without relief so she came to the emergency department today for evaluation.  Past Medical History:  Diagnosis Date  . Asthma   . Hypertension   . Renal artery stenosis (HCC)   . Thyroid disease     There are no active problems to display for this patient.   Past Surgical History:  Procedure Laterality Date  . CHOLECYSTECTOMY    . LAPAROSCOPIC GASTRIC BANDING    . LAPAROSCOPIC REPAIR AND REMOVAL OF GASTRIC BAND    . TONSILLECTOMY      Prior to Admission medications   Medication Sig Start Date End Date Taking? Authorizing Provider  albuterol (PROVENTIL HFA;VENTOLIN HFA) 108 (90 Base) MCG/ACT inhaler Inhale 2 puffs into the lungs every 6 (six) hours as needed for wheezing or shortness of breath. 07/20/16   Tommi Rumps, PA-C  benzonatate (TESSALON PERLES) 100 MG capsule Take one or 2 tablets every 8 hours for coughing. 07/20/16   Tommi Rumps, PA-C  busPIRone (BUSPAR) 10 MG tablet Take 10 mg by mouth 3 (three) times daily.    Historical Provider, MD  butalbital-acetaminophen-caffeine (FIORICET) [phone] MG tablet Take 1-2 tablets by mouth every 6 (six) hours as needed for headache. 04/20/16 04/20/17  Jene Every, MD  levothyroxine (SYNTHROID, LEVOTHROID) 100 MCG tablet Take 100 mcg by mouth daily before breakfast.    Historical Provider, MD  lisinopril (PRINIVIL,ZESTRIL)  40 MG tablet Take 40 mg by mouth daily.    Historical Provider, MD  LORazepam (ATIVAN) 0.5 MG tablet Take 0.5 mg by mouth every 8 (eight) hours as needed for anxiety.    Historical Provider, MD    Allergies  Allergen Reactions  . Codeine   . Pepto-Bismol [Bismuth]   . Toradol [Ketorolac Tromethamine]     No family history on file.  Social History Social History  Substance Use Topics  . Smoking status: Never Smoker  . Smokeless tobacco: Never Used  . Alcohol use Yes     Comment: occasionally    Review of Systems Constitutional: Negative for fever. Cardiovascular: Negative for chest pain. Respiratory: Negative for shortness of breath. Gastrointestinal: Positive for abdominal cramping, denies any "pain." Positive nausea. Denies vomiting. Positive for diarrhea. Denies black or bloody stool. Genitourinary: Negative for dysuria. Denies hematuria. Neurological: Negative for headache 10-point ROS otherwise negative.  ____________________________________________   PHYSICAL EXAM:  VITAL SIGNS: ED Triage Vitals  Enc Vitals Group     BP 09/11/16 1137 (!) 145/99     Pulse Rate 09/11/16 1137 87     Resp 09/11/16 1137 20     Temp 09/11/16 1137 98.7 F (37.1 C)     Temp Source 09/11/16 1137 Oral     SpO2 09/11/16 1137 96 %     Weight --      Height 09/11/16 1138 5\' 5"  (1.651 m)     Head Circumference --      Peak Flow --  Pain Score 09/11/16 1138 7     Pain Loc --      Pain Edu? --      Excl. in GC? --     Constitutional: Alert and oriented. Well appearing and in no distress. Eyes: Normal exam ENT   Head: Normocephalic and atraumatic.   Mouth/Throat: Mucous membranes are moist. Cardiovascular: Normal rate, regular rhythm. No murmur Respiratory: Normal respiratory effort without tachypnea nor retractions. Breath sounds are clear Gastrointestinal: Soft, mild lower abdominal tenderness palpation without rebound or guarding. No distention. Musculoskeletal:  Nontender with normal range of motion in all extremities Neurologic:  Normal speech and language. No gross focal neurologic deficits Skin:  Skin is warm, dry and intact.  Psychiatric: Mood and affect are normal.  ____________________________________________   INITIAL IMPRESSION / ASSESSMENT AND PLAN / ED COURSE  Pertinent labs & imaging results that were available during my care of the patient were reviewed by me and considered in my medical decision making (see chart for details).  The patient presents the emergency department with abdominal cramping and diarrhea since yesterday. Denies any "pain." States nausea but denies vomiting. On exam the patient has minimal tenderness to exam. Labs are largely within normal limits including a normal white blood cell count. Patient does have slight elevation in her liver function tests but no right upper quadrant tenderness to exam. Urinalysis is normal. Membranes are test is negative. We will treat symptomatically with fluids, Zofran and closely monitor in the emergency department.    Patient states she is feeling better after medications. I discussed proceeding with imaging such as CT scan given her abdominal discomfort which now she states is in the lower abdomen, she states she would rather hold off. We will treat with loperamide and a short course of pain medication. I discussed return precautions for worsening abdominal pain or fever. Patient agreeable.  ____________________________________________   FINAL CLINICAL IMPRESSION(S) / ED DIAGNOSES  Abdominal pain Diarrhea    Minna AntisKevin Que Meneely, MD 09/11/16 [phone]

## 2016-09-18 DIAGNOSIS — I10 Essential (primary) hypertension: Secondary | ICD-10-CM | POA: Insufficient documentation

## 2016-09-18 DIAGNOSIS — J40 Bronchitis, not specified as acute or chronic: Secondary | ICD-10-CM | POA: Insufficient documentation

## 2016-09-18 NOTE — ED Triage Notes (Signed)
Pt presents to ED with c/o shortness of breath today accompanied by nonproductive cough, reports hx of asthma. States has used inhaler and breathing treatment today without relief. Pt speaking in complete sentences, respirations even and unlabored.

## 2016-09-19 ENCOUNTER — Emergency Department: Payer: Self-pay

## 2016-09-19 ENCOUNTER — Encounter: Payer: Self-pay | Admitting: Emergency Medicine

## 2016-09-19 ENCOUNTER — Emergency Department
Admission: EM | Admit: 2016-09-19 | Discharge: 2016-09-19 | Disposition: A | Discharge status: Home / Self Care | Payer: Self-pay | Attending: Emergency Medicine | Admitting: Emergency Medicine

## 2016-09-19 DIAGNOSIS — R059 Cough, unspecified: Secondary | ICD-10-CM

## 2016-09-19 DIAGNOSIS — J40 Bronchitis, not specified as acute or chronic: Secondary | ICD-10-CM

## 2016-09-19 DIAGNOSIS — R0602 Shortness of breath: Secondary | ICD-10-CM

## 2016-09-19 DIAGNOSIS — R05 Cough: Secondary | ICD-10-CM

## 2016-09-19 MED ORDER — PREDNISONE 20 MG PO TABS
60.0000 mg | ORAL_TABLET | Freq: Once | ORAL | Status: AC
  Administered 2016-09-19: 60 mg via ORAL
  Filled 2016-09-19: qty 3

## 2016-09-19 MED ORDER — HYDROCOD POLST-CPM POLST ER 10-8 MG/5ML PO SUER: 5.0000 mL | Freq: Two times a day (BID) | ORAL | 0 refills | Status: DC

## 2016-09-19 MED ORDER — IPRATROPIUM-ALBUTEROL 0.5-2.5 (3) MG/3ML IN SOLN
3.0000 mL | Freq: Once | RESPIRATORY_TRACT | Status: AC
  Administered 2016-09-19: 3 mL via RESPIRATORY_TRACT
  Filled 2016-09-19: qty 3

## 2016-09-19 MED ORDER — PREDNISONE 20 MG PO TABS: 60.0000 mg | ORAL_TABLET | Freq: Every day | ORAL | 0 refills | Status: DC

## 2016-09-19 MED ORDER — HYDROCOD POLST-CPM POLST ER 10-8 MG/5ML PO SUER
5.0000 mL | Freq: Once | ORAL | Status: AC
  Administered 2016-09-19: 5 mL via ORAL
  Filled 2016-09-19: qty 5

## 2016-09-19 MED ORDER — ALBUTEROL SULFATE (2.5 MG/3ML) 0.083% IN NEBU
5.0000 mg | INHALATION_SOLUTION | Freq: Once | RESPIRATORY_TRACT | Status: AC
  Administered 2016-09-19: 5 mg via RESPIRATORY_TRACT
  Filled 2016-09-19: qty 6

## 2016-09-19 NOTE — ED Notes (Signed)
Dr. Zenda AlpersWebster at the bedside to re-evaluation and discuss plan of care with pt and her family.

## 2016-09-19 NOTE — Discharge Instructions (Signed)
Please follow-up with the acute care clinic but if her symptoms should worsen in your unable to follow-up please return to the emergency department for further evaluation.

## 2016-09-19 NOTE — ED Notes (Addendum)
Pt resting on stretcher with eyes closed and even respirations. Pt easily aroused. Breathing has appeared to improve. Family at the bedside. Will continue to assess.

## 2016-09-19 NOTE — ED Notes (Signed)
Dr. Webster at the bedside for pt evaluation 

## 2016-09-19 NOTE — ED Provider Notes (Signed)
[facility] Emergency Department Provider Note   ____________________________________________   First MD Initiated Contact with Patient 09/19/16 [phone]     (approximate)  I have reviewed the triage vital signs and the nursing notes.   HISTORY  Chief Complaint Asthma    HPI Leah Bright is a 26 y.o. female who comes into the hospital today with some chest discomfort. The patient has a history of asthma and reports that she has some chest tightness. She's had this for the past 2 days. She has been coughing a lot for the last few days as well. She tried using her breathing treatment at home but has not been helping. The patient reports that she's having pain in her chest when she is coughing and breathing. The patient reports that she's had no fevers. She had similar episodes with asthma exacerbations in the past. She denies any recent trips or any recent long car rides. She's had some nausea and vomiting she reports occasionally in the mornings. The patient is here today for evaluation.   Past Medical History:  Diagnosis Date  . Asthma   . Hypertension   . Renal artery stenosis (HCC)   . Thyroid disease     There are no active problems to display for this patient.   Past Surgical History:  Procedure Laterality Date  . CHOLECYSTECTOMY    . LAPAROSCOPIC GASTRIC BANDING    . LAPAROSCOPIC REPAIR AND REMOVAL OF GASTRIC BAND    . TONSILLECTOMY      Prior to Admission medications   Medication Sig Start Date End Date Taking? Authorizing Provider  albuterol (PROVENTIL HFA;VENTOLIN HFA) 108 (90 Base) MCG/ACT inhaler Inhale 2 puffs into the lungs every 6 (six) hours as needed for wheezing or shortness of breath. 07/20/16   Tommi Rumps, PA-C  benzonatate (TESSALON PERLES) 100 MG capsule Take one or 2 tablets every 8 hours for coughing. 07/20/16   Tommi Rumps, PA-C  busPIRone (BUSPAR) 10 MG tablet Take 10 mg by mouth 3 (three) times daily.     Historical Provider, MD  butalbital-acetaminophen-caffeine [facility]) [phone] MG tablet Take 1-2 tablets by mouth every 6 (six) hours as needed for headache. 04/20/16 04/20/17  Jene Every, MD  chlorpheniramine-HYDROcodone [facility] PENNKINETIC ER) 10-8 MG/5ML SUER Take 5 mLs by mouth 2 (two) times daily. 09/19/16   Rebecka Apley, MD  levothyroxine (SYNTHROID, LEVOTHROID) 100 MCG tablet Take 100 mcg by mouth daily before breakfast.    Historical Provider, MD  lisinopril (PRINIVIL,ZESTRIL) 40 MG tablet Take 40 mg by mouth daily.    Historical Provider, MD  loperamide (IMODIUM A-D) 2 MG tablet Take 1 tablet (2 mg total) by mouth 4 (four) times daily as needed for diarrhea or loose stools. 09/11/16   Minna Antis, MD  LORazepam (ATIVAN) 0.5 MG tablet Take 0.5 mg by mouth every 8 (eight) hours as needed for anxiety.    Historical Provider, MD  predniSONE (DELTASONE) 20 MG tablet Take 3 tablets (60 mg total) by mouth daily. 09/19/16   Rebecka Apley, MD  traMADol (ULTRAM) 50 MG tablet Take 1 tablet (50 mg total) by mouth every 6 (six) hours as needed. 09/11/16 09/11/17  Minna Antis, MD    Allergies Codeine; Pepto-bismol [bismuth]; and Toradol [ketorolac tromethamine]  No family history on file.  Social History Social History  Substance Use Topics  . Smoking status: Never Smoker  . Smokeless tobacco: Never Used  . Alcohol use No    Review of Systems  Constitutional: No fever/chills Eyes: No visual changes. ENT: No sore throat. Cardiovascular: chest tightness Respiratory: shortness of breath. Gastrointestinal: No abdominal pain.  No nausea, no vomiting.  No diarrhea.  No constipation. Genitourinary: Negative for dysuria. Musculoskeletal: Negative for back pain. Skin: Negative for rash. Neurological: Negative for headaches, focal weakness or numbness.  10-point ROS otherwise negative.  ____________________________________________   PHYSICAL EXAM:  VITAL SIGNS: ED  Triage Vitals  Enc Vitals Group     BP 09/18/16 2336 (!) 142/105     Pulse Rate 09/18/16 2336 95     Resp 09/18/16 2336 (!) 22     Temp 09/18/16 2336 98 F (36.7 C)     Temp Source 09/18/16 2336 Oral     SpO2 09/18/16 2336 96 %     Weight 09/18/16 2336 278 lb (126.1 kg)     Height 09/18/16 2336 5\' 5"  (1.651 m)     Head Circumference --      Peak Flow --      Pain Score 09/19/16 0000 (S) 8     Pain Loc --      Pain Edu? --      Excl. in GC? --     Constitutional: Alert and oriented. Well appearing and in mild distress. Eyes: Conjunctivae are normal. PERRL. EOMI. Head: Atraumatic. Nose: No congestion/rhinnorhea. Mouth/Throat: Mucous membranes are moist.  Oropharynx non-erythematous. Cardiovascular: Normal rate, regular rhythm. Grossly normal heart sounds.  Good peripheral circulation. Respiratory: Normal respiratory effort.  No retractions. Lungs CTAB Gastrointestinal: Soft and nontender. No distention. Positive bowel sounds Musculoskeletal: No lower extremity tenderness nor edema.   Neurologic:  Normal speech and language.  Skin:  Skin is warm, dry and intact.  Psychiatric: Mood and affect are normal.   ____________________________________________   LABS (all labs ordered are listed, but only abnormal results are displayed)  Labs Reviewed - No data to display ____________________________________________  EKG  none ____________________________________________  RADIOLOGY  CXR ____________________________________________   PROCEDURES  Procedure(s) performed: None  Procedures  Critical Care performed: No  ____________________________________________   INITIAL IMPRESSION / ASSESSMENT AND PLAN / ED COURSE  Pertinent labs & imaging results that were available during my care of the patient were reviewed by me and considered in my medical decision making (see chart for details).  This is a 26 year old female who comes into the hospital today with chest  tightness. The patient has asthma and reports that this is similar to when she has her asthma flares. The patient has a lot of coughing although she doesn't have a lot of wheezes. I will send the patient for a DuoNeb treatment. I will also give the patient a dose of prednisone. I will then reassess the patient. After the medication the patient reports that she feels improved. A chest x-ray does not show no acute cardiopulmonary disease. As the patient is feeling improved I will discharge her home to have her follow-up with her primary care physician.  Clinical Course as of Sep 19 737  Fri Sep 19, 2016  0239 No active cardiopulmonary disease. DG Chest 2 View [AW]    Clinical Course User Index [AW] Rebecka ApleyAllison P Scotlynn Noyes, MD     ____________________________________________   FINAL CLINICAL IMPRESSION(S) / ED DIAGNOSES  Final diagnoses:  Shortness of breath  Cough  Bronchitis      NEW MEDICATIONS STARTED DURING THIS VISIT:  Discharge Medication List as of 09/19/2016  4:11 AM    START taking these medications   Details  chlorpheniramine-HYDROcodone (TUSSIONEX PENNKINETIC  ER) 10-8 MG/5ML SUER Take 5 mLs by mouth 2 (two) times daily., Starting Fri 09/19/2016, Print    predniSONE (DELTASONE) 20 MG tablet Take 3 tablets (60 mg total) by mouth daily., Starting Fri 09/19/2016, Print         Note:  This document was prepared using Dragon voice recognition software and may include unintentional dictation errors.    Rebecka Apley, MD 09/19/16 [phone]

## 2016-09-20 ENCOUNTER — Emergency Department: Payer: Self-pay

## 2016-09-20 ENCOUNTER — Encounter: Payer: Self-pay | Admitting: Emergency Medicine

## 2016-09-20 ENCOUNTER — Emergency Department
Admission: EM | Admit: 2016-09-20 | Discharge: 2016-09-21 | Disposition: A | Discharge status: Home / Self Care | Payer: Self-pay | Attending: Emergency Medicine | Admitting: Emergency Medicine

## 2016-09-20 DIAGNOSIS — Z79899 Other long term (current) drug therapy: Secondary | ICD-10-CM | POA: Insufficient documentation

## 2016-09-20 DIAGNOSIS — J208 Acute bronchitis due to other specified organisms: Secondary | ICD-10-CM | POA: Insufficient documentation

## 2016-09-20 DIAGNOSIS — I10 Essential (primary) hypertension: Secondary | ICD-10-CM | POA: Insufficient documentation

## 2016-09-20 LAB — INFLUENZA PANEL BY PCR (TYPE A & B)
Influenza A By PCR: NEGATIVE
Influenza B By PCR: NEGATIVE

## 2016-09-20 MED ORDER — SODIUM CHLORIDE 0.9 % IV BOLUS (SEPSIS)
1000.0000 mL | Freq: Once | INTRAVENOUS | Status: AC
  Administered 2016-09-21: 1000 mL via INTRAVENOUS

## 2016-09-20 MED ORDER — IOPAMIDOL (ISOVUE-300) INJECTION 61%
75.0000 mL | Freq: Once | INTRAVENOUS | Status: AC | PRN
  Administered 2016-09-20: 75 mL via INTRAVENOUS
  Filled 2016-09-20: qty 75

## 2016-09-20 NOTE — ED Notes (Signed)
Patient returns to ED 51 from radiology at this time. PA made aware that patient is back in the department.

## 2016-09-20 NOTE — ED Provider Notes (Signed)
[facility] Emergency Department Provider Note   ____________________________________________   None    (approximate)  I have reviewed the triage vital signs and the nursing notes.   HISTORY  Chief Complaint Fever (pt. states fever with bronchitits.)    HPI Leah RackKelli K Brazel is a 26 y.o. female resents for sudden onset of fever chills body aches and headache onset approximately a day and half ago. Patient states past medical history significant for pneumonia. Patient states she has been coughing with nonproductive sputum. No relief with over-the-counter medications. Husband patient were both concerned that she doesn't typically present in the normal manner for pneumonia and wished to have a CT done. Patient feels weak and dehydrated. Denies any vomiting has had diarrhea in the last several days. None today.   Past Medical History:  Diagnosis Date  . Asthma   . Hypertension   . Renal artery stenosis (HCC)   . Thyroid disease     There are no active problems to display for this patient.   Past Surgical History:  Procedure Laterality Date  . CHOLECYSTECTOMY    . LAPAROSCOPIC GASTRIC BANDING    . LAPAROSCOPIC REPAIR AND REMOVAL OF GASTRIC BAND    . TONSILLECTOMY      Prior to Admission medications   Medication Sig Start Date End Date Taking? Authorizing Provider  albuterol (PROVENTIL HFA;VENTOLIN HFA) 108 (90 Base) MCG/ACT inhaler Inhale 2 puffs into the lungs every 6 (six) hours as needed for wheezing or shortness of breath. 07/20/16   Tommi Rumpshonda L Summers, PA-C  benzonatate (TESSALON PERLES) 100 MG capsule Take one or 2 tablets every 8 hours for coughing. 07/20/16   Tommi Rumpshonda L Summers, PA-C  busPIRone (BUSPAR) 10 MG tablet Take 10 mg by mouth 3 (three) times daily.    Historical Provider, MD  butalbital-acetaminophen-caffeine [facility](FIORICET) [phone] MG tablet Take 1-2 tablets by mouth every 6 (six) hours as needed for headache. 04/20/16 04/20/17  Jene Everyobert  Kinner, MD  chlorpheniramine-HYDROcodone [facility]TUSSIONEX PENNKINETIC ER) 10-8 MG/5ML SUER Take 5 mLs by mouth 2 (two) times daily. 09/19/16   Rebecka ApleyAllison P Webster, MD  levothyroxine (SYNTHROID, LEVOTHROID) 100 MCG tablet Take 100 mcg by mouth daily before breakfast.    Historical Provider, MD  lisinopril (PRINIVIL,ZESTRIL) 40 MG tablet Take 40 mg by mouth daily.    Historical Provider, MD  loperamide (IMODIUM A-D) 2 MG tablet Take 1 tablet (2 mg total) by mouth 4 (four) times daily as needed for diarrhea or loose stools. 09/11/16   Minna AntisKevin Paduchowski, MD  LORazepam (ATIVAN) 0.5 MG tablet Take 0.5 mg by mouth every 8 (eight) hours as needed for anxiety.    Historical Provider, MD  predniSONE (DELTASONE) 20 MG tablet Take 3 tablets (60 mg total) by mouth daily. 09/19/16   Rebecka ApleyAllison P Webster, MD  traMADol (ULTRAM) 50 MG tablet Take 1 tablet (50 mg total) by mouth every 6 (six) hours as needed. 09/11/16 09/11/17  Minna AntisKevin Paduchowski, MD    Allergies Codeine; Pepto-bismol [bismuth]; and Toradol [ketorolac tromethamine]  No family history on file.  Social History Social History  Substance Use Topics  . Smoking status: Never Smoker  . Smokeless tobacco: Never Used  . Alcohol use No    Review of Systems Constitutional: Positive fever/chills Eyes: No visual changes. ENT: Minimal sore throat. Positive nasal congestion Cardiovascular: Denies chest pain. Respiratory: Denies shortness of breath. As a for cough. Musculoskeletal: Negative for back pain. Skin: Negative for rash. Neurological: Negative for headaches, focal weakness or numbness.  10-point ROS otherwise negative.  ____________________________________________   PHYSICAL EXAM:  VITAL SIGNS: ED Triage Vitals  Enc Vitals Group     BP 09/20/16 1723 (!) 139/102     Pulse Rate 09/20/16 1723 83     Resp 09/20/16 1723 18     Temp 09/20/16 1723 98.9 F (37.2 C)     Temp Source 09/20/16 1723 Oral     SpO2 09/20/16 1723 97 %     Weight 09/20/16  1726 278 lb (126.1 kg)     Height 09/20/16 1726 5\' 5"  (1.651 m)     Head Circumference --      Peak Flow --      Pain Score 09/20/16 1726 6     Pain Loc --      Pain Edu? --      Excl. in GC? --     Constitutional: Alert and oriented. Well appearing and in no acute distress. Nose: No congestion/rhinnorhea.Minimal turbinate edema. Mouth/Throat: Mucous membranes are moist.  Oropharynx non-erythematous. Neck: No stridor. Full full range of motion nontender.  Cardiovascular: Normal rate, regular rhythm. Grossly normal heart sounds.  Good peripheral circulation. Respiratory: Normal respiratory effort.  No retractions. Lungs CTAB. Decreased breath sounds bilaterally no coarse rhonchi noted. Musculoskeletal: No lower extremity tenderness nor edema.  No joint effusions. Neurologic:  Normal speech and language. No gross focal neurologic deficits are appreciated. No gait instability. Skin:  Skin is warm, dry and intact. No rash noted. Psychiatric: Mood and affect are normal. Speech and behavior are normal.  ____________________________________________   LABS (all labs ordered are listed, but only abnormal results are displayed)  Labs Reviewed  INFLUENZA PANEL BY PCR (TYPE A & B)   ____________________________________________  EKG   ____________________________________________  RADIOLOGY  No acute cardiopulmonary findings on x-ray. Chest CT negative for any pneumonia. ____________________________________________   PROCEDURES  Procedure(s) performed: None  Procedures  Critical Care performed: No  ____________________________________________   INITIAL IMPRESSION / ASSESSMENT AND PLAN / ED COURSE  Pertinent labs & imaging results that were available during my care of the patient were reviewed by me and considered in my medical decision making (see chart for details).  Patient with influenza-like symptoms. Transferred over to the main ED for IV fluid administration and  discharge by ED provider.    ____________________________________________   FINAL CLINICAL IMPRESSION(S) / ED DIAGNOSES  Final diagnoses:  None      NEW MEDICATIONS STARTED DURING THIS VISIT:  New Prescriptions   No medications on file     Note:  This document was prepared using Dragon voice recognition software and may include unintentional dictation errors.   Evangeline Dakin, PA-C 09/20/16 2358    Jene Every, MD 09/22/16 [phone]

## 2016-09-20 NOTE — ED Notes (Signed)
Pt. States cough for the past couple days.  Pt. States she was treated with bronchitis, but has not been able to pick up medications.  Pt. States she was at store, but felt weak and was unable to pick up prescriptions.

## 2016-09-20 NOTE — ED Triage Notes (Signed)
Pt. States she was dx here yesterday with bronchitis, pt. States she was in the process of picking prescriptions today when she felt worse.  Pt. States hx of pneumonia.

## 2016-09-20 NOTE — ED Notes (Signed)
Patient to radiology at this time for PA ordered CT scan on the chest.

## 2016-09-20 NOTE — ED Notes (Signed)
Pt up to stat registration to have temp rechecked; pt has not left the lobby and not sure why she was moved to Off the floor

## 2016-09-21 LAB — BASIC METABOLIC PANEL
Anion gap: 10 (ref 5–15)
BUN: 11 mg/dL (ref 6–20)
CO2: 25 mmol/L (ref 22–32)
CREATININE: 0.84 mg/dL (ref 0.44–1.00)
Calcium: 9.1 mg/dL (ref 8.9–10.3)
Chloride: 108 mmol/L (ref 101–111)
GFR calc Af Amer: >60 mL/min (ref >60)
Glucose, Bld: 104 mg/dL — ABNORMAL HIGH (ref 65–99)
Potassium: 3.7 mmol/L (ref 3.5–5.1)
SODIUM: 143 mmol/L (ref 135–145)

## 2016-09-21 LAB — CBC WITH DIFFERENTIAL/PLATELET
Basophils Absolute: 0 K/uL (ref 0–0.1)
Basophils Relative: 1 %
Eosinophils Absolute: 0.1 K/uL (ref 0–0.7)
Eosinophils Relative: 2 %
HCT: 39.5 % (ref 35.0–47.0)
Hemoglobin: 13.3 g/dL (ref 12.0–16.0)
Lymphocytes Relative: 30 %
Lymphs Abs: 1.9 K/uL (ref 1.0–3.6)
MCH: 27.4 pg (ref 26.0–34.0)
MCHC: 33.5 g/dL (ref 32.0–36.0)
MCV: 81.8 fL (ref 80.0–100.0)
Monocytes Absolute: 0.4 K/uL (ref 0.2–0.9)
Monocytes Relative: 7 %
Neutro Abs: 3.9 K/uL (ref 1.4–6.5)
Neutrophils Relative %: 60 %
Platelets: 224 K/uL (ref 150–440)
RBC: 4.83 MIL/uL (ref 3.80–5.20)
RDW: 13.6 % (ref 11.5–14.5)
WBC: 6.5 K/uL (ref 3.6–11.0)

## 2016-09-21 MED ORDER — PREDNISONE 20 MG PO TABS
40.0000 mg | ORAL_TABLET | Freq: Once | ORAL | Status: AC
  Administered 2016-09-21: 40 mg via ORAL
  Filled 2016-09-21: qty 2

## 2016-09-21 MED ORDER — HYDROCOD POLST-CPM POLST ER 10-8 MG/5ML PO SUER
5.0000 mL | Freq: Once | ORAL | Status: AC
  Administered 2016-09-21: 5 mL via ORAL
  Filled 2016-09-21: qty 5

## 2016-09-21 NOTE — ED Notes (Signed)
Pt states that she can feel a knot in her arm where her last IV was at. This RN checked the arm and the arm was not red, swollen, or warm to touch. Pt told that her arm would feel better soon.

## 2016-09-21 NOTE — Discharge Instructions (Signed)
Please take the prescribed medications and any medications that you have at home.  Follow up with your doctor as recommended.  If you develop any new or worsening symptoms, including but not limited to high fever (102 F or greater), persistent vomiting, worsening shortness of breath, or other symptoms that concern you, please return to the Emergency Department immediately.

## 2016-09-21 NOTE — ED Notes (Signed)
Pt still receiving fluid through IV. Pt stating that she wants something for cough but states that Tessalon Pearls and Robitussin are not doing anything for her. MD Quale made aware.

## 2016-09-21 NOTE — ED Provider Notes (Signed)
[facility] Emergency Department Provider Note   ____________________________________________   First MD Initiated Contact with Patient 09/21/16 0112     (approximate)  I have reviewed the triage vital signs and the nursing notes.   HISTORY  Chief Complaint Fever (pt. states fever with bronchitits.)    HPI Leah Bright is a 26 y.o. female reports she's had about 3 days of cough, runny nose congestion and feeling like she has a fever. [facility] she began feeling hot and sweaty, and fatigued. This problem at her and her husband to come the ER for reevaluation.  She's not having any pain. She has nebulizers at home, she has not yet filled her prednisone prescription for cough medicine prescription. She did have a dry nonproductive cough  Denies pregnancy. Denies feeling short of breath at this time. No chest pain. No abdominal pain. No shaking chills. Does feel achy though.    Past Medical History:  Diagnosis Date  . Asthma   . Hypertension   . Renal artery stenosis (HCC)   . Thyroid disease     There are no active problems to display for this patient.   Past Surgical History:  Procedure Laterality Date  . CHOLECYSTECTOMY    . LAPAROSCOPIC GASTRIC BANDING    . LAPAROSCOPIC REPAIR AND REMOVAL OF GASTRIC BAND    . TONSILLECTOMY      Prior to Admission medications   Medication Sig Start Date End Date Taking? Authorizing Provider  albuterol (PROVENTIL HFA;VENTOLIN HFA) 108 (90 Base) MCG/ACT inhaler Inhale 2 puffs into the lungs every 6 (six) hours as needed for wheezing or shortness of breath. 07/20/16   Tommi Rumpshonda L Summers, PA-C  benzonatate (TESSALON PERLES) 100 MG capsule Take one or 2 tablets every 8 hours for coughing. 07/20/16   Tommi Rumpshonda L Summers, PA-C  busPIRone (BUSPAR) 10 MG tablet Take 10 mg by mouth 3 (three) times daily.    Historical Provider, MD  butalbital-acetaminophen-caffeine [facility](FIORICET)  [id number] MG tablet Take 1-2 tablets by mouth every 6 (six) hours as needed for headache. 04/20/16 04/20/17  Jene Everyobert Kinner, MD  chlorpheniramine-HYDROcodone [facility](TUSSIONEX PENNKINETIC ER) 10-8 MG/5ML SUER Take 5 mLs by mouth 2 (two) times daily. 09/19/16   Rebecka ApleyAllison P Webster, MD  levothyroxine (SYNTHROID, LEVOTHROID) 100 MCG tablet Take 100 mcg by mouth daily before breakfast.    Historical Provider, MD  lisinopril (PRINIVIL,ZESTRIL) 40 MG tablet Take 40 mg by mouth daily.    Historical Provider, MD  loperamide (IMODIUM A-D) 2 MG tablet Take 1 tablet (2 mg total) by mouth 4 (four) times daily as needed for diarrhea or loose stools. 09/11/16   Minna AntisKevin Paduchowski, MD  LORazepam (ATIVAN) 0.5 MG tablet Take 0.5 mg by mouth every 8 (eight) hours as needed for anxiety.    Historical Provider, MD  predniSONE (DELTASONE) 20 MG tablet Take 3 tablets (60 mg total) by mouth daily. 09/19/16   Rebecka ApleyAllison P Webster, MD  traMADol (ULTRAM) 50 MG tablet Take 1 tablet (50 mg total) by mouth every 6 (six) hours as needed. 09/11/16 09/11/17  Minna AntisKevin Paduchowski, MD   Patient denies any history of blood clots. No hemoptysis. Nonsmoker. Not taking any birth controls. No recent long trips, travels or hospitalizations. No leg swelling.  Allergies Codeine; Pepto-bismol [bismuth]; and Toradol [ketorolac tromethamine]  No family history on file.  Social History Social History  Substance Use Topics  . Smoking status: Never Smoker  . Smokeless tobacco: Never Used  . Alcohol  use No    Review of Systems Constitutional: Fevers, some chills Eyes: No visual changes. ENT: No sore throat. Cardiovascular: Denies chest pain. Respiratory: See history of present illness Gastrointestinal: No abdominal pain.  No nausea, no vomiting.  No diarrhea.  No constipation. Genitourinary: Negative for dysuria. Musculoskeletal: Negative for back pain. Skin: Negative for rash. Neurological: Negative for headaches, focal weakness or  numbness.    10-point ROS otherwise negative.  ____________________________________________   PHYSICAL EXAM:  VITAL SIGNS: ED Triage Vitals  Enc Vitals Group     BP 09/20/16 1723 (!) 139/102     Pulse Rate 09/20/16 1723 83     Resp 09/20/16 1723 18     Temp 09/20/16 1723 98.9 F (37.2 C)     Temp Source 09/20/16 1723 Oral     SpO2 09/20/16 1723 97 %     Weight 09/20/16 1726 278 lb (126.1 kg)     Height 09/20/16 1726 5\' 5"  (1.651 m)     Head Circumference --      Peak Flow --      Pain Score 09/20/16 1726 6     Pain Loc --      Pain Edu? --      Excl. in GC? --     Constitutional: Alert and oriented. Well appearing and in no acute distressAs she appears just mildly under the weather, mildly ill but in absolute no distress conversing with her husband. Eyes: Conjunctivae are normal. PERRL. EOMI. Head: Atraumatic. Nose: No congestion/rhinnorhea. Mouth/Throat: Mucous membranes are moist.  Oropharynx non-erythematous. Neck: No stridor.   Cardiovascular: Normal rate, regular rhythm. Grossly normal heart sounds.  Good peripheral circulation. Respiratory: Normal respiratory effort.  No retractions. Lungs CTAB.Occasional dry nonproductive cough. Gastrointestinal: Soft and nontender. No distention. Musculoskeletal: No lower extremity tenderness nor edema.  No joint effusions. Neurologic:  Normal speech and language. No gross focal neurologic deficits are appreciated. No gait instability. Skin:  Skin is warm, dry and intact. No rash noted. Psychiatric: Mood and affect are normal. Speech and behavior are normal.  ____________________________________________   LABS (all labs ordered are listed, but only abnormal results are displayed)  Labs Reviewed  BASIC METABOLIC PANEL - Abnormal; Notable for the following:       Result Value   Glucose, Bld 104 (*)    All other components within normal limits  INFLUENZA PANEL BY PCR (TYPE A & B)  CBC WITH DIFFERENTIAL/PLATELET    ____________________________________________  EKG   ____________________________________________  RADIOLOGY  Dg Chest 2 View  Result Date: 09/20/2016 CLINICAL DATA:  Cough, congestion, body aches EXAM: CHEST  2 VIEW COMPARISON:  09/19/2016 FINDINGS: Cardiomediastinal silhouette is stable. No infiltrate or pleural effusion. No pulmonary edema. Bony thorax is unremarkable. IMPRESSION: No active cardiopulmonary disease. Electronically Signed   By: Natasha Mead M.D.   On: 09/20/2016 20:28   Ct Chest W Contrast  Result Date: 09/20/2016 CLINICAL DATA:  Bronchitis, continuous cough EXAM: CT CHEST WITH CONTRAST TECHNIQUE: Multidetector CT imaging of the chest was performed during intravenous contrast administration. CONTRAST:  75mL ISOVUE-300 IOPAMIDOL (ISOVUE-300) INJECTION 61% COMPARISON:  Chest x-ray 09/20/2016 FINDINGS: Cardiovascular: Non aneurysmal aorta. No dissection. Normal heart size. No pericardial effusion. Mediastinum/Nodes: Soft tissue density in the anterior mediastinum fell consistent with residual thymic tissue. No masslike features. No significant mediastinal or hilar adenopathy. Trachea is midline. Esophagus within normal limits. Lungs/Pleura: Lungs are clear. No pleural effusion or pneumothorax. Upper Abdomen: Fatty infiltration of the liver. Surgical clips in the gallbladder  fossa. No acute abnormalities. Musculoskeletal: No chest wall abnormality. No acute or significant osseous findings. IMPRESSION: 1. Lung fields are clear.  No acute infiltrate or effusion. 2. Hepatic steatosis Electronically Signed   By: Jasmine Pang M.D.   On: 09/20/2016 23:45    Also reviewed his yesterday's CT scan which did not demonstrate any acute process. ____________________________________________   PROCEDURES  Procedure(s) performed: None  Procedures  Critical Care performed: No  ____________________________________________   INITIAL IMPRESSION / ASSESSMENT AND PLAN / ED  COURSE  Pertinent labs & imaging results that were available during my care of the patient were reviewed by me and considered in my medical decision making (see chart for details).  Patient transfer fevers chills and cough. Appears stable. Recent thorough evaluation yesterday. Patient reveals today, has not yet filled her prescriptions and reports an ongoing cough. I will give her second dose of prednisone, she is not received this yet, in addition cough medicine. She appears stable, no acute distress, clear lung sounds with no evidence of wheezing. No cardiac symptoms.      Pulmonary Embolism Rule-out Criteria (PERC rule)                        If YES to ANY of the following, the PERC rule is not satisfied and cannot be used to rule out PE in this patient (consider d-dimer or imaging depending on pre-test probability).                      If NO to ALL of the following, AND the clinician's pre-test probability is <15%, the [facility] rule is satisfied and there is no need for further workup (including no need to obtain a d-dimer) as the post-test probability of pulmonary embolism is <2%.                      Mnemonic is HAD CLOTS   H - hormone use (exogenous estrogen)      No. A - age > 50                                                 No. D - DVT/PE history                                      No.   C - coughing blood (hemoptysis)                 No. L - leg swelling, unilateral                             No. O - O2 Sat on Room Air < 95%                  No. T - tachycardia (HR ? 100)                         No. S - surgery or trauma, recent                      No.   Based on my evaluation of the patient, including  application of this decision instrument, further testing to evaluate for pulmonary embolism is not indicated at this time. I have discussed this recommendation with the patient who states understanding and agreement with this plan.  Patient's clinical exam and history appear  most consistent with acute bronchitis. Continue the patient on prednisone, antitussive therapy, and close return precautions follow-up advised. Patient reports feeling improved after receiving a liter of fluid, awake alert no distress. Appears appropriate for ongoing outpatient management  Return precautions and treatment recommendations and follow-up discussed with the patient who is agreeable with the plan.   Clinical Course as of Sep 22 147  Sat Sep 20, 2016  2045 DG Chest 2 View [CB]  2045 DG Chest 2 View [CB]    Clinical Course User Index [CB] Evangeline Dakin, PA-C     ____________________________________________   FINAL CLINICAL IMPRESSION(S) / ED DIAGNOSES  Final diagnoses:  Viral bronchitis      NEW MEDICATIONS STARTED DURING THIS VISIT:  New Prescriptions   No medications on file     Note:  This document was prepared using Dragon voice recognition software and may include unintentional dictation errors.     Sharyn Creamer, MD 09/21/16 [phone]

## 2016-11-26 ENCOUNTER — Encounter: Payer: Self-pay | Admitting: Emergency Medicine

## 2016-11-26 ENCOUNTER — Emergency Department: Payer: Self-pay

## 2016-11-26 DIAGNOSIS — J45909 Unspecified asthma, uncomplicated: Secondary | ICD-10-CM | POA: Insufficient documentation

## 2016-11-26 DIAGNOSIS — I10 Essential (primary) hypertension: Secondary | ICD-10-CM | POA: Insufficient documentation

## 2016-11-26 DIAGNOSIS — M10072 Idiopathic gout, left ankle and foot: Secondary | ICD-10-CM | POA: Insufficient documentation

## 2016-11-26 NOTE — ED Triage Notes (Signed)
Pt to triage via w/c with no distress noted; pt reports left foot pain since last night with no known injury

## 2016-11-27 ENCOUNTER — Emergency Department
Admission: EM | Admit: 2016-11-27 | Discharge: 2016-11-27 | Disposition: A | Discharge status: Home / Self Care | Payer: Self-pay | Attending: Emergency Medicine | Admitting: Emergency Medicine

## 2016-11-27 DIAGNOSIS — M109 Gout, unspecified: Secondary | ICD-10-CM

## 2016-11-27 MED ORDER — COLCHICINE 0.6 MG PO TABS
0.6000 mg | ORAL_TABLET | Freq: Once | ORAL | Status: AC
  Administered 2016-11-27: 0.6 mg via ORAL
  Filled 2016-11-27: qty 1

## 2016-11-27 MED ORDER — INDOMETHACIN 50 MG PO CAPS
50.0000 mg | ORAL_CAPSULE | Freq: Once | ORAL | Status: AC
  Administered 2016-11-27: 50 mg via ORAL
  Filled 2016-11-27: qty 1

## 2016-11-27 MED ORDER — INDOMETHACIN 50 MG PO CAPS: 50.0000 mg | ORAL_CAPSULE | Freq: Three times a day (TID) | ORAL | 0 refills | Status: DC | PRN

## 2016-11-27 NOTE — ED Provider Notes (Signed)
[facility] Emergency Department Provider Note   First MD Initiated Contact with Patient 11/27/16 [id number]     (approximate)  I have reviewed the triage vital signs and the nursing notes.   HISTORY  Chief Complaint Foot Pain    HPI Leah Bright is a 26 y.o. female with below of chronic medical conditions presents to the emergency department with nontraumatic left great toe pain. Patient denies any fever. Patient denies any previous history of gout. Patient states current pain score is 8 out of 10. Patient states pain is worse with movement and palpation.   Past Medical History:  Diagnosis Date  . Asthma   . Hypertension   . Renal artery stenosis (HCC)   . Thyroid disease     There are no active problems to display for this patient.   Past Surgical History:  Procedure Laterality Date  . CHOLECYSTECTOMY    . LAPAROSCOPIC GASTRIC BANDING    . LAPAROSCOPIC REPAIR AND REMOVAL OF GASTRIC BAND    . TONSILLECTOMY      Prior to Admission medications   Medication Sig Start Date End Date Taking? Authorizing Provider  albuterol (PROVENTIL HFA;VENTOLIN HFA) 108 (90 Base) MCG/ACT inhaler Inhale 2 puffs into the lungs every 6 (six) hours as needed for wheezing or shortness of breath. 07/20/16   Tommi Rumpshonda L Summers, PA-C  benzonatate (TESSALON PERLES) 100 MG capsule Take one or 2 tablets every 8 hours for coughing. 07/20/16   Tommi Rumpshonda L Summers, PA-C  busPIRone (BUSPAR) 10 MG tablet Take 10 mg by mouth 3 (three) times daily.    Historical Provider, MD  butalbital-acetaminophen-caffeine [facility](FIORICET) [phone] MG tablet Take 1-2 tablets by mouth every 6 (six) hours as needed for headache. 04/20/16 04/20/17  Jene Everyobert Kinner, MD  chlorpheniramine-HYDROcodone [facility](TUSSIONEX PENNKINETIC ER) 10-8 MG/5ML SUER Take 5 mLs by mouth 2 (two) times daily. 09/19/16   Rebecka ApleyAllison P Webster, MD  levothyroxine (SYNTHROID, LEVOTHROID) 100 MCG tablet Take 100 mcg by mouth daily before breakfast.     Historical Provider, MD  lisinopril (PRINIVIL,ZESTRIL) 40 MG tablet Take 40 mg by mouth daily.    Historical Provider, MD  loperamide (IMODIUM A-D) 2 MG tablet Take 1 tablet (2 mg total) by mouth 4 (four) times daily as needed for diarrhea or loose stools. 09/11/16   Minna AntisKevin Paduchowski, MD  LORazepam (ATIVAN) 0.5 MG tablet Take 0.5 mg by mouth every 8 (eight) hours as needed for anxiety.    Historical Provider, MD  predniSONE (DELTASONE) 20 MG tablet Take 3 tablets (60 mg total) by mouth daily. 09/19/16   Rebecka ApleyAllison P Webster, MD  traMADol (ULTRAM) 50 MG tablet Take 1 tablet (50 mg total) by mouth every 6 (six) hours as needed. 09/11/16 09/11/17  Minna AntisKevin Paduchowski, MD    Allergies Codeine; Pepto-bismol [bismuth]; and Toradol [ketorolac tromethamine]  No family history on file.  Social History Social History  Substance Use Topics  . Smoking status: Never Smoker  . Smokeless tobacco: Never Used  . Alcohol use No    Review of Systems Constitutional: No fever/chills Eyes: No visual changes. ENT: No sore throat. Cardiovascular: Denies chest pain. Respiratory: Denies shortness of breath. Gastrointestinal: No abdominal pain.  No nausea, no vomiting.  No diarrhea.  No constipation. Genitourinary: Negative for dysuria. Musculoskeletal: Negative for back pain.Positive for left great toe pain Skin: Negative for rash. Neurological: Negative for headaches, focal weakness or numbness.  10-point ROS otherwise negative.  ____________________________________________   PHYSICAL EXAM:  VITAL SIGNS: ED Triage Vitals  Enc Vitals Group     BP 11/26/16 2328 (!) 144/100     Pulse Rate 11/26/16 2328 78     Resp 11/26/16 2328 20     Temp --      Temp src --      SpO2 11/26/16 2328 100 %     Weight 11/26/16 2327 276 lb (125.2 kg)     Height 11/26/16 2327 5\' 5"  (1.651 m)     Head Circumference --      Peak Flow --      Pain Score 11/26/16 2326 7     Pain Loc --      Pain Edu? --      Excl. in GC?  --     Constitutional: Alert and oriented. Well appearing and in no acute distress. Eyes: Conjunctivae are normal. PERRL. EOMI. Head: Atraumatic. Mouth/Throat: Mucous membranes are moist.  Oropharynx non-erythematous. Neck: No stridor. Cardiovascular: Normal rate, regular rhythm. Good peripheral circulation. Grossly normal heart sounds. Respiratory: Normal respiratory effort.  No retractions. Lungs CTAB. Gastrointestinal: Soft and nontender. No distention.  Musculoskeletal: No lower extremity tenderness nor edema. No gross deformities of extremities. Left great toe pain with palpation and active and passive movement of the great toe. Neurologic:  Normal speech and language. No gross focal neurologic deficits are appreciated.  Skin:  Skin is warm, dry and intact. No rash noted. Psychiatric: Mood and affect are normal. Speech and behavior are normal.    RADIOLOGY I, [place] Rhiley Tarver, personally viewed and evaluated these images (plain radiographs) as part of my medical decision making, as well as reviewing the written report by the radiologist.  Dg Foot Complete Left  Result Date: 11/27/2016 CLINICAL DATA:  Acute onset of left foot pain.  Initial encounter. EXAM: LEFT FOOT - COMPLETE 3+ VIEW COMPARISON:  None. FINDINGS: There is no evidence of fracture or dislocation. The joint spaces are preserved. There is no evidence of talar subluxation; the subtalar joint is unremarkable in appearance. There is a bipartite medial sesamoid of the first toe. An os peroneum is noted. No significant soft tissue abnormalities are seen. IMPRESSION: 1. No evidence of fracture dislocation. 2. Bipartite medial sesamoid of the first toe. 3. Os peroneum noted. Electronically Signed   By: Roanna Raider M.D.   On: 11/27/2016 00:12    ______  Procedures   ____________________________________________   INITIAL IMPRESSION / ASSESSMENT AND PLAN / ED COURSE  Pertinent labs & imaging results that were available  during my care of the patient were reviewed by me and considered in my medical decision making (see chart for details).  Patient given Indocin and colchicine the emergency department. History of physical exam consistent with gout      ____________________________________________  FINAL CLINICAL IMPRESSION(S) / ED DIAGNOSES  Final diagnoses:  Acute gout involving toe of left foot, unspecified cause     MEDICATIONS GIVEN DURING THIS VISIT:  Medications  colchicine tablet 0.6 mg (0.6 mg Oral Given 11/27/16 0410)  indomethacin (INDOCIN) capsule 50 mg (50 mg Oral Given 11/27/16 0410)     NEW OUTPATIENT MEDICATIONS STARTED DURING THIS VISIT:  New Prescriptions   No medications on file    Modified Medications   No medications on file    Discontinued Medications   No medications on file     Note:  This document was prepared using Dragon voice recognition software and may include unintentional dictation errors.    Darci Current, MD 11/27/16 [phone]

## 2017-01-01 ENCOUNTER — Emergency Department
Admission: EM | Admit: 2017-01-01 | Discharge: 2017-01-01 | Disposition: A | Discharge status: Home / Self Care | Payer: Self-pay | Attending: Emergency Medicine | Admitting: Emergency Medicine

## 2017-01-01 ENCOUNTER — Encounter: Payer: Self-pay | Admitting: Emergency Medicine

## 2017-01-01 ENCOUNTER — Emergency Department: Payer: Self-pay

## 2017-01-01 DIAGNOSIS — Z79899 Other long term (current) drug therapy: Secondary | ICD-10-CM | POA: Insufficient documentation

## 2017-01-01 DIAGNOSIS — J45909 Unspecified asthma, uncomplicated: Secondary | ICD-10-CM | POA: Insufficient documentation

## 2017-01-01 DIAGNOSIS — J069 Acute upper respiratory infection, unspecified: Secondary | ICD-10-CM | POA: Insufficient documentation

## 2017-01-01 DIAGNOSIS — M7702 Medial epicondylitis, left elbow: Secondary | ICD-10-CM | POA: Insufficient documentation

## 2017-01-01 DIAGNOSIS — R739 Hyperglycemia, unspecified: Secondary | ICD-10-CM | POA: Insufficient documentation

## 2017-01-01 DIAGNOSIS — I10 Essential (primary) hypertension: Secondary | ICD-10-CM | POA: Insufficient documentation

## 2017-01-01 LAB — URINALYSIS, COMPLETE (UACMP) WITH MICROSCOPIC
BILIRUBIN URINE: NEGATIVE
Bacteria, UA: NONE SEEN
Glucose, UA: NEGATIVE mg/dL
HGB URINE DIPSTICK: NEGATIVE
Ketones, ur: NEGATIVE mg/dL
Leukocytes, UA: NEGATIVE
Nitrite: NEGATIVE
PROTEIN: 30 mg/dL — AB
RBC / HPF: NONE SEEN RBC/hpf (ref 0–5)
Specific Gravity, Urine: 1.029 (ref 1.005–1.030)
pH: 5 (ref 5.0–8.0)

## 2017-01-01 LAB — COMPREHENSIVE METABOLIC PANEL
ALBUMIN: 3.7 g/dL (ref 3.5–5.0)
ALT: 65 U/L — ABNORMAL HIGH (ref 14–54)
AST: 57 U/L — AB (ref 15–41)
Alkaline Phosphatase: 78 U/L (ref 38–126)
Anion gap: 6 (ref 5–15)
BUN: 11 mg/dL (ref 6–20)
CHLORIDE: 107 mmol/L (ref 101–111)
CO2: 25 mmol/L (ref 22–32)
Calcium: 9 mg/dL (ref 8.9–10.3)
Creatinine, Ser: 0.68 mg/dL (ref 0.44–1.00)
GFR calc Af Amer: >60 mL/min (ref >60)
GFR calc non Af Amer: >60 mL/min (ref >60)
GLUCOSE: 152 mg/dL — AB (ref 65–99)
POTASSIUM: 3.7 mmol/L (ref 3.5–5.1)
SODIUM: 138 mmol/L (ref 135–145)
Total Bilirubin: 0.8 mg/dL (ref 0.3–1.2)
Total Protein: 6.6 g/dL (ref 6.5–8.1)

## 2017-01-01 LAB — POCT RAPID STREP A: Streptococcus, Group A Screen (Direct): NEGATIVE

## 2017-01-01 LAB — CBC
HEMATOCRIT: 38 % (ref 35.0–47.0)
Hemoglobin: 12.7 g/dL (ref 12.0–16.0)
MCH: 26.6 pg (ref 26.0–34.0)
MCHC: 33.5 g/dL (ref 32.0–36.0)
MCV: 79.2 fL — ABNORMAL LOW (ref 80.0–100.0)
PLATELETS: 217 10*3/uL (ref 150–440)
RBC: 4.8 MIL/uL (ref 3.80–5.20)
RDW: 13.2 % (ref 11.5–14.5)
WBC: 6.9 10*3/uL (ref 3.6–11.0)

## 2017-01-01 LAB — LIPASE, BLOOD: LIPASE: 21 U/L (ref 11–51)

## 2017-01-01 LAB — POCT PREGNANCY, URINE: PREG TEST UR: NEGATIVE

## 2017-01-01 MED ORDER — ONDANSETRON 4 MG PO TBDP
4.0000 mg | ORAL_TABLET | Freq: Once | ORAL | Status: AC
  Administered 2017-01-01: 4 mg via ORAL
  Filled 2017-01-01: qty 1

## 2017-01-01 MED ORDER — LIDOCAINE VISCOUS 2 % MT SOLN
15.0000 mL | Freq: Once | OROMUCOSAL | Status: AC
  Administered 2017-01-01: 15 mL via OROMUCOSAL
  Filled 2017-01-01: qty 15

## 2017-01-01 MED ORDER — ONDANSETRON HCL 4 MG PO TABS: 4.0000 mg | ORAL_TABLET | Freq: Every day | ORAL | 0 refills | Status: DC | PRN

## 2017-01-01 MED ORDER — LIDOCAINE VISCOUS 2 % MT SOLN: 10.0000 mL | OROMUCOSAL | 0 refills | Status: DC | PRN

## 2017-01-01 NOTE — ED Triage Notes (Addendum)
Pt to ED via POV with c/o sore throat since last night. Pt has other multiple complaints but states this is the main concern. Pt denies any fevers. Pt also states she is nauseated. Pt LMP was aporx 2months ago, states she could be pregnant when asked. Pt ambulatory , NAD noted

## 2017-01-01 NOTE — ED Provider Notes (Signed)
[facility] Emergency Department Provider Note  ____________________________________________  Time seen: Approximately 8:45 PM  I have reviewed the triage vital signs and the nursing notes.   HISTORY  Chief Complaint Sore Throat    HPI Leah Bright is a 26 y.o. female that presents to the emergency department with sore throat, non productive cough, nausea, dysuria, urgency, frequency for 1 day and left elbow pain for one week. Patient states that she is having trouble eating and drinking due to sore throat. Today she felt nauseous and hot. She did not throw up. She has been having left elbow pain for one week and has been wearing a splint. No known injury. She has had tennis elbow in the past. She started having dysuria, urgency, frequency last night and took Azo for symptoms. Patient is a [employer] teacher. She denies fever, shortness of breath, chest pain, vomiting, diarrhea, constipation.   Past Medical History:  Diagnosis Date  . Asthma   . Hypertension   . Renal artery stenosis (HCC)   . Thyroid disease     There are no active problems to display for this patient.   Past Surgical History:  Procedure Laterality Date  . CHOLECYSTECTOMY    . LAPAROSCOPIC GASTRIC BANDING    . LAPAROSCOPIC REPAIR AND REMOVAL OF GASTRIC BAND    . TONSILLECTOMY      Prior to Admission medications   Medication Sig Start Date End Date Taking? Authorizing Provider  albuterol (PROVENTIL HFA;VENTOLIN HFA) 108 (90 Base) MCG/ACT inhaler Inhale 2 puffs into the lungs every 6 (six) hours as needed for wheezing or shortness of breath. 07/20/16   Tommi Rumpshonda L Summers, PA-C  benzonatate (TESSALON PERLES) 100 MG capsule Take one or 2 tablets every 8 hours for coughing. 07/20/16   Tommi Rumpshonda L Summers, PA-C  busPIRone (BUSPAR) 10 MG tablet Take 10 mg by mouth 3 (three) times daily.    Historical Provider, MD  butalbital-acetaminophen-caffeine [facility](FIORICET) [phone] MG tablet Take 1-2  tablets by mouth every 6 (six) hours as needed for headache. 04/20/16 04/20/17  Jene Everyobert Kinner, MD  chlorpheniramine-HYDROcodone [facility](TUSSIONEX PENNKINETIC ER) 10-8 MG/5ML SUER Take 5 mLs by mouth 2 (two) times daily. 09/19/16   Rebecka ApleyAllison P Webster, MD  indomethacin (INDOCIN) 50 MG capsule Take 1 capsule (50 mg total) by mouth 3 (three) times daily as needed. 11/27/16   Darci Currentandolph N Brown, MD  levothyroxine (SYNTHROID, LEVOTHROID) 100 MCG tablet Take 100 mcg by mouth daily before breakfast.    Historical Provider, MD  lidocaine (XYLOCAINE) 2 % solution Use as directed 10 mLs in the mouth or throat as needed for mouth pain. 01/01/17   Enid DerryAshley Gladys Deckard, PA-C  lisinopril (PRINIVIL,ZESTRIL) 40 MG tablet Take 40 mg by mouth daily.    Historical Provider, MD  loperamide (IMODIUM A-D) 2 MG tablet Take 1 tablet (2 mg total) by mouth 4 (four) times daily as needed for diarrhea or loose stools. 09/11/16   Minna AntisKevin Paduchowski, MD  LORazepam (ATIVAN) 0.5 MG tablet Take 0.5 mg by mouth every 8 (eight) hours as needed for anxiety.    Historical Provider, MD  ondansetron (ZOFRAN) 4 MG tablet Take 1 tablet (4 mg total) by mouth daily as needed for nausea or vomiting. 01/01/17 01/01/18  Enid DerryAshley Crystel Demarco, PA-C  predniSONE (DELTASONE) 20 MG tablet Take 3 tablets (60 mg total) by mouth daily. 09/19/16   Rebecka ApleyAllison P Webster, MD  traMADol (ULTRAM) 50 MG tablet Take 1 tablet (50 mg total) by mouth every 6 (six) hours as needed.  09/11/16 09/11/17  Minna Antis, MD    Allergies Codeine; Pepto-bismol [bismuth]; and Toradol [ketorolac tromethamine]  No family history on file.  Social History Social History  Substance Use Topics  . Smoking status: Never Smoker  . Smokeless tobacco: Never Used  . Alcohol use No     Review of Systems  Constitutional: No fever/chills ENT: No upper respiratory complaints. Cardiovascular: No chest pain. Respiratory: Positive for cough. No SOB. Gastrointestinal: No abdominal pain.  No vomiting.   Musculoskeletal: Positive for elbow pain Skin: Negative for rash, abrasions, lacerations, ecchymosis. Neurological: Negative for headaches, numbness or tingling   ____________________________________________   PHYSICAL EXAM:  VITAL SIGNS: ED Triage Vitals  Enc Vitals Group     BP 01/01/17 2009 (!) 147/90     Pulse Rate 01/01/17 2006 88     Resp 01/01/17 2006 16     Temp 01/01/17 2006 98.7 F (37.1 C)     Temp Source 01/01/17 2006 Oral     SpO2 01/01/17 2006 98 %     Weight 01/01/17 2007 278 lb (126.1 kg)     Height 01/01/17 2007 5\' 5"  (1.651 m)     Head Circumference --      Peak Flow --      Pain Score 01/01/17 2006 8     Pain Loc --      Pain Edu? --      Excl. in GC? --      Constitutional: Alert and oriented. Well appearing and in no acute distress. Eyes: Conjunctivae are normal. PERRL. EOMI. Head: Atraumatic. ENT:      Ears: Tympanic membrane pearly gray with good landmarks.      Nose: No congestion/rhinnorhea.      Mouth/Throat: Mucous membranes are moist. Oropharynx nonerythematous. Tonsils nonenlarged. Neck: No stridor.   Cardiovascular: Normal rate, regular rhythm.  Good peripheral circulation. Respiratory: Normal respiratory effort without tachypnea or retractions. Lungs CTAB. Good air entry to the bases with no decreased or absent breath sounds. Gastrointestinal: Bowel sounds 4 quadrants. Soft and nontender to palpation. No guarding or rigidity. No palpable masses. No distention. No CVA tenderness. Musculoskeletal: No gross deformities appreciated. Tenderness to palpation over medial epicondyle. Limited ROM and strength due to pain. No erythema, swelling, bruising. Patient is wearing a sling. Neurologic:  Normal speech and language. No gross focal neurologic deficits are appreciated.  Skin:  Skin is warm, dry and intact. No rash noted.   ____________________________________________   LABS (all labs ordered are listed, but only abnormal results are  displayed)  Labs Reviewed  CBC - Abnormal; Notable for the following:       Result Value   MCV 79.2 (*)    All other components within normal limits  COMPREHENSIVE METABOLIC PANEL - Abnormal; Notable for the following:    Glucose, Bld 152 (*)    AST 57 (*)    ALT 65 (*)    All other components within normal limits  URINALYSIS, COMPLETE (UACMP) WITH MICROSCOPIC - Abnormal; Notable for the following:    Color, Urine YELLOW (*)    APPearance TURBID (*)    Protein, ur 30 (*)    Squamous Epithelial / LPF 0-5 (*)    All other components within normal limits  LIPASE, BLOOD  POC URINE PREG, ED  POCT PREGNANCY, URINE  POCT RAPID STREP A   ____________________________________________  EKG   ____________________________________________  RADIOLOGY Lexine Baton, personally viewed and evaluated these images (plain radiographs) as part of my medical decision  making, as well as reviewing the written report by the radiologist.  Dg Elbow Complete Left  Result Date: 01/01/2017 CLINICAL DATA:  Persistent pain after possible hyperextension injury 1 week ago. EXAM: LEFT ELBOW - COMPLETE 3+ VIEW COMPARISON:  None. FINDINGS: There is no evidence of fracture, dislocation, or joint effusion. There is no evidence of arthropathy or other focal bone abnormality. Soft tissues are unremarkable. IMPRESSION: Negative. Electronically Signed   By: Ellery Plunk M.D.   On: 01/01/2017 21:15    ____________________________________________    PROCEDURES  Procedure(s) performed:    Procedures    Medications  lidocaine (XYLOCAINE) 2 % viscous mouth solution 15 mL (15 mLs Mouth/Throat Given 01/01/17 2114)  ondansetron (ZOFRAN-ODT) disintegrating tablet 4 mg (4 mg Oral Given 01/01/17 2114)     ____________________________________________   INITIAL IMPRESSION / ASSESSMENT AND PLAN / ED COURSE  Pertinent labs & imaging results that were available during my care of the patient were reviewed by me  and considered in my medical decision making (see chart for details).  Review of the [place] CSRS was performed in accordance of the [facility] prior to dispensing any controlled drugs.     Patient's diagnosis is consistent with viral upper respiratory infection and medial epicondylitis. Vital signs and exam are reassuring. Elbow x-ray negative for acute bony abnormalities. Strep negative. CBC unremarkable. Glucose increased on CMP. Education about hyperglycemia and diabetes testing was provided. No infection on urinalysis. Patient is going to establish care with primary care. Patient will be discharged home with prescriptions for zofran, viscous lidocaine. Patient is to follow up with PCP as directed. Patient is given ED precautions to return to the ED for any worsening or new symptoms.     ____________________________________________  FINAL CLINICAL IMPRESSION(S) / ED DIAGNOSES  Final diagnoses:  Viral upper respiratory tract infection  Medial epicondylitis of left elbow  Hyperglycemia      NEW MEDICATIONS STARTED DURING THIS VISIT:  Discharge Medication List as of 01/01/2017 11:09 PM    START taking these medications   Details  lidocaine (XYLOCAINE) 2 % solution Use as directed 10 mLs in the mouth or throat as needed for mouth pain., Starting Thu 01/01/2017, Print    ondansetron (ZOFRAN) 4 MG tablet Take 1 tablet (4 mg total) by mouth daily as needed for nausea or vomiting., Starting Thu 01/01/2017, Until Fri 01/01/2018, Print            This chart was dictated using voice recognition software/Dragon. Despite best efforts to proofread, errors can occur which can change the meaning. Any change was purely unintentional.    Enid Derry, PA-C 01/02/17 0017    Sharman Cheek, MD 01/05/17 2220

## 2017-01-01 NOTE — ED Notes (Signed)
See triage note. Pt c/o sore throat and nausea. Denies fever

## 2017-01-31 ENCOUNTER — Emergency Department
Admission: EM | Admit: 2017-01-31 | Discharge: 2017-01-31 | Disposition: A | Discharge status: Home / Self Care | Payer: Self-pay | Attending: Emergency Medicine | Admitting: Emergency Medicine

## 2017-01-31 ENCOUNTER — Emergency Department: Payer: Self-pay

## 2017-01-31 ENCOUNTER — Encounter: Payer: Self-pay | Admitting: Medical Oncology

## 2017-01-31 DIAGNOSIS — Z79899 Other long term (current) drug therapy: Secondary | ICD-10-CM | POA: Insufficient documentation

## 2017-01-31 DIAGNOSIS — M25512 Pain in left shoulder: Secondary | ICD-10-CM | POA: Insufficient documentation

## 2017-01-31 DIAGNOSIS — I10 Essential (primary) hypertension: Secondary | ICD-10-CM | POA: Insufficient documentation

## 2017-01-31 DIAGNOSIS — J45909 Unspecified asthma, uncomplicated: Secondary | ICD-10-CM | POA: Insufficient documentation

## 2017-01-31 MED ORDER — CYCLOBENZAPRINE HCL 5 MG PO TABS: 5.0000 mg | ORAL_TABLET | Freq: Three times a day (TID) | ORAL | 0 refills | Status: AC | PRN

## 2017-01-31 MED ORDER — LIDOCAINE 5 % EX PTCH: 1.0000 | MEDICATED_PATCH | CUTANEOUS | 0 refills | Status: DC

## 2017-01-31 MED ORDER — PREDNISONE 10 MG PO TABS: 40.0000 mg | ORAL_TABLET | Freq: Every day | ORAL | 0 refills | Status: AC

## 2017-01-31 MED ORDER — LIDOCAINE 5 % EX PTCH
1.0000 | MEDICATED_PATCH | CUTANEOUS | Status: DC
  Administered 2017-01-31: 1 via TRANSDERMAL
  Filled 2017-01-31: qty 1

## 2017-01-31 NOTE — ED Provider Notes (Signed)
[facility] Emergency Department Provider Note  ____________________________________________  Time seen: Approximately 6:37 PM  I have reviewed the triage vital signs and the nursing notes.   HISTORY  Chief Complaint Shoulder Pain    HPI Leah Bright is a 26 y.o. female that presents to the emergency department with left shoulder pain for 3 weeks. Patient states that she originally thought it was her elbow but elbow improved and shoulder pain worsened. She states it is painful to lift her arm over her head.It is primarily tender in the back of her shoulder. She has been taking Advil for pain. She has tried icing shoulder without relief. Her husband gives her shoulder massages which has started to become painful. No trauma. She is a Lawyer and pushes around desks. She denies neck pain, shortness of breath, chest pain, nausea, vomiting, abdominal pain, numbness, tingling.   Past Medical History:  Diagnosis Date  . Asthma   . Hypertension   . Renal artery stenosis (HCC)   . Thyroid disease     There are no active problems to display for this patient.   Past Surgical History:  Procedure Laterality Date  . CHOLECYSTECTOMY    . LAPAROSCOPIC GASTRIC BANDING    . LAPAROSCOPIC REPAIR AND REMOVAL OF GASTRIC BAND    . TONSILLECTOMY      Prior to Admission medications   Medication Sig Start Date End Date Taking? Authorizing Provider  albuterol (PROVENTIL HFA;VENTOLIN HFA) 108 (90 Base) MCG/ACT inhaler Inhale 2 puffs into the lungs every 6 (six) hours as needed for wheezing or shortness of breath. 07/20/16   Tommi Rumps, PA-C  benzonatate (TESSALON PERLES) 100 MG capsule Take one or 2 tablets every 8 hours for coughing. 07/20/16   Tommi Rumps, PA-C  busPIRone (BUSPAR) 10 MG tablet Take 10 mg by mouth 3 (three) times daily.    [provider]  butalbital-acetaminophen-caffeine (FIORICET) [phone] MG tablet Take 1-2 tablets by  mouth every 6 (six) hours as needed for headache. 04/20/16 04/20/17  Jene Every, MD  chlorpheniramine-HYDROcodone (TUSSIONEX PENNKINETIC ER) 10-8 MG/5ML SUER Take 5 mLs by mouth 2 (two) times daily. 09/19/16   Rebecka Apley, MD  cyclobenzaprine (FLEXERIL) 5 MG tablet Take 1 tablet (5 mg total) by mouth 3 (three) times daily as needed for muscle spasms. 01/31/17 02/07/17  Enid Derry, PA-C  indomethacin (INDOCIN) 50 MG capsule Take 1 capsule (50 mg total) by mouth 3 (three) times daily as needed. 11/27/16   Darci Current, MD  levothyroxine (SYNTHROID, LEVOTHROID) 100 MCG tablet Take 100 mcg by mouth daily before breakfast.    [provider]  lidocaine (LIDODERM) 5 % Place 1 patch onto the skin daily. Remove & Discard patch within 12 hours or as directed by MD 01/31/17 01/31/18  Enid Derry, PA-C  lidocaine (XYLOCAINE) 2 % solution Use as directed 10 mLs in the mouth or throat as needed for mouth pain. 01/01/17   Enid Derry, PA-C  lisinopril (PRINIVIL,ZESTRIL) 40 MG tablet Take 40 mg by mouth daily.    [provider]  loperamide (IMODIUM A-D) 2 MG tablet Take 1 tablet (2 mg total) by mouth 4 (four) times daily as needed for diarrhea or loose stools. 09/11/16   Minna Antis, MD  LORazepam (ATIVAN) 0.5 MG tablet Take 0.5 mg by mouth every 8 (eight) hours as needed for anxiety.    [provider]  ondansetron (ZOFRAN) 4 MG tablet Take 1 tablet (4 mg total) by mouth  daily as needed for nausea or vomiting. 01/01/17 01/01/18  Enid Derry, PA-C  predniSONE (DELTASONE) 10 MG tablet Take 4 tablets (40 mg total) by mouth daily. 01/31/17 02/05/17  Enid Derry, PA-C  traMADol (ULTRAM) 50 MG tablet Take 1 tablet (50 mg total) by mouth every 6 (six) hours as needed. 09/11/16 09/11/17  Minna Antis, MD    Allergies Codeine; Pepto-bismol [bismuth]; and Toradol [ketorolac tromethamine]  No family history on file.  Social History Social History  Substance Use Topics  .  Smoking status: Never Smoker  . Smokeless tobacco: Never Used  . Alcohol use No     Review of Systems  Constitutional: No fever/chills Cardiovascular: No chest pain. Respiratory: No SOB. Gastrointestinal: No abdominal pain.  No nausea, no vomiting.  Musculoskeletal: Positive for shoulder pain. Skin: Negative for rash, abrasions, lacerations, ecchymosis. Neurological: Negative for headaches, numbness or tingling   ____________________________________________   PHYSICAL EXAM:  VITAL SIGNS: ED Triage Vitals  Enc Vitals Group     BP 01/31/17 1607 (!) 151/94     Pulse Rate 01/31/17 1607 79     Resp 01/31/17 1607 18     Temp 01/31/17 1607 98.4 F (36.9 C)     Temp Source 01/31/17 1607 Oral     SpO2 01/31/17 1607 98 %     Weight 01/31/17 1602 278 lb (126.1 kg)     Height --      Head Circumference --      Peak Flow --      Pain Score 01/31/17 1758 5     Pain Loc --      Pain Edu? --      Excl. in GC? --      Constitutional: Alert and oriented. Well appearing and in no acute distress. Eyes: Conjunctivae are normal. PERRL. EOMI. Head: Atraumatic. ENT:      Ears:      Nose: No congestion/rhinnorhea.      Mouth/Throat: Mucous membranes are moist.  Neck: No stridor.  No cervical spine tenderness to palpation. Cardiovascular: Normal rate, regular rhythm.  Good peripheral circulation. 2+ radial pulses. Respiratory: Normal respiratory effort without tachypnea or retractions. Lungs CTAB. Good air entry to the bases with no decreased or absent breath sounds. Musculoskeletal: Full range of motion to all extremities. No gross deformities appreciated. Limited strength in left shoulder due to pain. 90 degrees active range of motion. 180 degrees passive range of motion. Tenderness to palpation above scapula midway between shoulder and neck. Neurologic:  Normal speech and language. No gross focal neurologic deficits are appreciated.  Skin:  Skin is warm, dry and intact. No rash  noted.    ____________________________________________   LABS (all labs ordered are listed, but only abnormal results are displayed)  Labs Reviewed - No data to display ____________________________________________  EKG   ____________________________________________  RADIOLOGY Lexine Baton, personally viewed and evaluated these images (plain radiographs) as part of my medical decision making, as well as reviewing the written report by the radiologist.  Dg Shoulder Left  Result Date: 01/31/2017 CLINICAL DATA:  Left shoulder pain.  No known injury. EXAM: LEFT SHOULDER - 2+ VIEW COMPARISON:  None. FINDINGS: There is no evidence of fracture or dislocation. There is no evidence of arthropathy or other focal bone abnormality. Soft tissues are unremarkable. IMPRESSION: Negative. Electronically Signed   By: Charlett Nose M.D.   On: 01/31/2017 16:49    ____________________________________________    PROCEDURES  Procedure(s) performed:    Procedures  Medications - No data to display   ____________________________________________   INITIAL IMPRESSION / ASSESSMENT AND PLAN / ED COURSE  Pertinent labs & imaging results that were available during my care of the patient were reviewed by me and considered in my medical decision making (see chart for details).  Review of the [place] CSRS was performed in accordance of the [facility] prior to dispensing any controlled drugs.  Patient presents to the emergency department with shoulder pain for 3 weeks. Vital signs and exam are reassuring. Xray negative for acute bony abnormalities. She has limited active range of motion but full passive range of motion. She would likely benefit from a trigger point injection but she does not have insurance and is not able to see an orthopedist. We discussed trying oral prednisone first. Patient will be discharged home with prescriptions for prednisone, Flexeril, Lidoderm. Patient is to follow up with PCP as  directed. Patient is given ED precautions to return to the ED for any worsening or new symptoms.     ____________________________________________  FINAL CLINICAL IMPRESSION(S) / ED DIAGNOSES  Final diagnoses:  Acute pain of left shoulder      NEW MEDICATIONS STARTED DURING THIS VISIT:  Discharge Medication List as of 01/31/2017  5:40 PM    START taking these medications   Details  cyclobenzaprine (FLEXERIL) 5 MG tablet Take 1 tablet (5 mg total) by mouth 3 (three) times daily as needed for muscle spasms., Starting Sat 01/31/2017, Until Sat 02/07/2017, Print    lidocaine (LIDODERM) 5 % Place 1 patch onto the skin daily. Remove & Discard patch within 12 hours or as directed by MD, Starting Sat 01/31/2017, Until Sun 01/31/2018, Print            This chart was dictated using voice recognition software/Dragon. Despite best efforts to proofread, errors can occur which can change the meaning. Any change was purely unintentional.    Enid DerryWagner, Tymon Nemetz, PA-C 02/01/17 1542    Governor RooksLord, Rebecca, MD 02/07/17 [id number]

## 2017-01-31 NOTE — ED Notes (Signed)
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 States pain moves occasionally into left arm  No deformity noted

## 2017-01-31 NOTE — ED Triage Notes (Signed)
Pt reports left shoulder pain x 2 weeks, pain worsens with touch and movement. No known injury.

## 2017-01-31 NOTE — ED Notes (Signed)
Pt verbalized understanding of discharge instructions. NAD at this time. 

## 2017-04-15 ENCOUNTER — Encounter: Payer: Self-pay | Admitting: Emergency Medicine

## 2017-04-15 ENCOUNTER — Emergency Department
Admission: EM | Admit: 2017-04-15 | Discharge: 2017-04-15 | Disposition: A | Discharge status: Home / Self Care | Payer: Self-pay | Attending: Student in an Organized Health Care Education/Training Program | Admitting: Student in an Organized Health Care Education/Training Program

## 2017-04-15 DIAGNOSIS — F32A Depression, unspecified: Secondary | ICD-10-CM

## 2017-04-15 DIAGNOSIS — F419 Anxiety disorder, unspecified: Secondary | ICD-10-CM | POA: Insufficient documentation

## 2017-04-15 DIAGNOSIS — F329 Major depressive disorder, single episode, unspecified: Secondary | ICD-10-CM

## 2017-04-15 HISTORY — DX: Anxiety disorder, unspecified: F41.9

## 2017-04-15 HISTORY — DX: Major depressive disorder, single episode, unspecified: F32.9

## 2017-04-15 HISTORY — DX: Depression, unspecified: F32.A

## 2017-04-15 LAB — COMPREHENSIVE METABOLIC PANEL
ALBUMIN: 3.9 g/dL (ref 3.5–5.0)
ALT: 65 U/L — ABNORMAL HIGH (ref 14–54)
ANION GAP: 12 (ref 5–15)
AST: 61 U/L — AB (ref 15–41)
Alkaline Phosphatase: 73 U/L (ref 38–126)
BILIRUBIN TOTAL: 0.7 mg/dL (ref 0.3–1.2)
BUN: 12 mg/dL (ref 6–20)
CHLORIDE: 107 mmol/L (ref 101–111)
CO2: 21 mmol/L — AB (ref 22–32)
Calcium: 9.3 mg/dL (ref 8.9–10.3)
Creatinine, Ser: 0.92 mg/dL (ref 0.44–1.00)
GFR calc Af Amer: >60 mL/min (ref >60)
GFR calc non Af Amer: >60 mL/min (ref >60)
GLUCOSE: 182 mg/dL — AB (ref 65–99)
POTASSIUM: 3.9 mmol/L (ref 3.5–5.1)
SODIUM: 140 mmol/L (ref 135–145)
TOTAL PROTEIN: 6.7 g/dL (ref 6.5–8.1)

## 2017-04-15 LAB — CBC
HEMATOCRIT: 40.1 % (ref 35.0–47.0)
HEMOGLOBIN: 13.8 g/dL (ref 12.0–16.0)
MCH: 27.8 pg (ref 26.0–34.0)
MCHC: 34.5 g/dL (ref 32.0–36.0)
MCV: 80.6 fL (ref 80.0–100.0)
Platelets: 271 10*3/uL (ref 150–440)
RBC: 4.97 MIL/uL (ref 3.80–5.20)
RDW: 13.8 % (ref 11.5–14.5)
WBC: 7.8 10*3/uL (ref 3.6–11.0)

## 2017-04-15 LAB — ETHANOL: Alcohol, Ethyl (B): <5 mg/dL (ref <5)

## 2017-04-15 LAB — ACETAMINOPHEN LEVEL

## 2017-04-15 LAB — SALICYLATE LEVEL: Salicylate Lvl: <7 mg/dL (ref 2.8–30.0)

## 2017-04-15 MED ORDER — TRAZODONE HCL 100 MG PO TABS: 100.0000 mg | ORAL_TABLET | Freq: Every day | ORAL | 0 refills | Status: DC

## 2017-04-15 MED ORDER — BUSPIRONE HCL 10 MG PO TABS: 10.0000 mg | ORAL_TABLET | Freq: Three times a day (TID) | ORAL | 0 refills | Status: AC

## 2017-04-15 NOTE — Discharge Instructions (Signed)
Follow up with psychiatry as soon as possible.  Return for worsening symptoms or thoughts of hurting yourself or others.

## 2017-04-15 NOTE — ED Provider Notes (Signed)
[facility] Emergency Department Provider Note    None    (approximate)  I have reviewed the triage vital signs and the nursing notes.   HISTORY  Chief Complaint Depression and Anxiety    HPI LOXLEY CIBRIAN is a 26 y.o. female history of anxiety and depression presents with worsening symptoms or the past several weeks. Patient recently moved from [place] and has not been on her BuSpar trazodone for over a year now. Patient states that she is feeling more stressed out due to financial constraints as well as some confrontations she's having with the stepmother of her children. Patient denies any SI or HI. No hallucinations. States that her sleep and appetite have decreased. States that she would like to be restarted on her medications but has not been able to establish care with psychiatry. She is awaiting for an appointment with Dr. Maryruth Bun.   Past Medical History:  Diagnosis Date  . Anxiety   . Asthma   . Depression   . Hypertension   . Renal artery stenosis (HCC)   . Thyroid disease    History reviewed. No pertinent family history. Past Surgical History:  Procedure Laterality Date  . CHOLECYSTECTOMY    . LAPAROSCOPIC GASTRIC BANDING    . LAPAROSCOPIC REPAIR AND REMOVAL OF GASTRIC BAND    . TONSILLECTOMY     There are no active problems to display for this patient.     Prior to Admission medications   Medication Sig Start Date End Date Taking? Authorizing Provider  albuterol (PROVENTIL HFA;VENTOLIN HFA) 108 (90 Base) MCG/ACT inhaler Inhale 2 puffs into the lungs every 6 (six) hours as needed for wheezing or shortness of breath. 07/20/16   Tommi Rumps, PA-C  benzonatate (TESSALON PERLES) 100 MG capsule Take one or 2 tablets every 8 hours for coughing. 07/20/16   Tommi Rumps, PA-C  busPIRone (BUSPAR) 10 MG tablet Take 1 tablet (10 mg total) by mouth 3 (three) times daily. 04/15/17 05/15/17  Willy Eddy, MD    butalbital-acetaminophen-caffeine [facility]) [phone] MG tablet Take 1-2 tablets by mouth every 6 (six) hours as needed for headache. 04/20/16 04/20/17  Jene Every, MD  chlorpheniramine-HYDROcodone (TUSSIONEX PENNKINETIC ER) 10-8 MG/5ML SUER Take 5 mLs by mouth 2 (two) times daily. 09/19/16   Rebecka Apley, MD  indomethacin (INDOCIN) 50 MG capsule Take 1 capsule (50 mg total) by mouth 3 (three) times daily as needed. 11/27/16   Darci Current, MD  levothyroxine (SYNTHROID, LEVOTHROID) 100 MCG tablet Take 100 mcg by mouth daily before breakfast.    [provider]  lidocaine (LIDODERM) 5 % Place 1 patch onto the skin daily. Remove & Discard patch within 12 hours or as directed by MD 01/31/17 01/31/18  Enid Derry, PA-C  lidocaine (XYLOCAINE) 2 % solution Use as directed 10 mLs in the mouth or throat as needed for mouth pain. 01/01/17   Enid Derry, PA-C  lisinopril (PRINIVIL,ZESTRIL) 40 MG tablet Take 40 mg by mouth daily.    [provider]  loperamide (IMODIUM A-D) 2 MG tablet Take 1 tablet (2 mg total) by mouth 4 (four) times daily as needed for diarrhea or loose stools. 09/11/16   Minna Antis, MD  LORazepam (ATIVAN) 0.5 MG tablet Take 0.5 mg by mouth every 8 (eight) hours as needed for anxiety.    [provider]  ondansetron (ZOFRAN) 4 MG tablet Take 1 tablet (4 mg total) by mouth daily as needed for nausea or vomiting. 01/01/17  01/01/18  Enid DerryWagner, Ashley, PA-C  traMADol (ULTRAM) 50 MG tablet Take 1 tablet (50 mg total) by mouth every 6 (six) hours as needed. 09/11/16 09/11/17  Minna AntisPaduchowski, Kevin, MD  traZODone (DESYREL) 100 MG tablet Take 1 tablet (100 mg total) by mouth at bedtime. 04/15/17 04/29/17  Willy Eddyobinson, Quintyn Dombek, MD    Allergies Codeine; Pepto-bismol [bismuth]; and Toradol [ketorolac tromethamine]    Social History Social History  Substance Use Topics  . Smoking status: Never Smoker  . Smokeless tobacco: Never Used  . Alcohol use No    Review  of Systems Patient denies headaches, rhinorrhea, blurry vision, numbness, shortness of breath, chest pain, edema, cough, abdominal pain, nausea, vomiting, diarrhea, dysuria, fevers, rashes or hallucinations unless otherwise stated above in HPI. ____________________________________________   PHYSICAL EXAM:  VITAL SIGNS: Vitals:   04/15/17 1048  BP: (!) 131/100  Pulse: 95  Resp: 18  Temp: 98.6 F (37 C)  SpO2: 100%    Constitutional: Alert and oriented. Well appearing and in no acute distress. Eyes: Conjunctivae are normal.  Head: Atraumatic. Nose: No congestion/rhinnorhea. Mouth/Throat: Mucous membranes are moist.   Neck: No stridor. Painless ROM.  Cardiovascular: Normal rate, regular rhythm. Grossly normal heart sounds.  Good peripheral circulation. Respiratory: Normal respiratory effort.  No retractions. Lungs CTAB. Gastrointestinal: Soft and nontender. No distention. No abdominal bruits. No CVA tenderness. Genitourinary:  Musculoskeletal: No lower extremity tenderness nor edema.  No joint effusions. Neurologic:  Normal speech and language. No gross focal neurologic deficits are appreciated. No facial droop Skin:  Skin is warm, dry and intact. No rash noted. Psychiatric: Mood and affect are normal. Speech and behavior are normal.  ____________________________________________   LABS (all labs ordered are listed, but only abnormal results are displayed)  Results for orders placed or performed during the hospital encounter of 04/15/17 (from the past 24 hour(s))  cbc     Status: None   Collection Time: 04/15/17 10:47 AM  Result Value Ref Range   WBC 7.8 3.6 - 11.0 K/uL   RBC 4.97 3.80 - 5.20 MIL/uL   Hemoglobin 13.8 12.0 - 16.0 g/dL   HCT 19.140.1 47.835.0 - 29.547.0 %   MCV 80.6 80.0 - 100.0 fL   MCH 27.8 26.0 - 34.0 pg   MCHC 34.5 32.0 - 36.0 g/dL   RDW 62.113.8 30.811.5 - 65.714.5 %   Platelets 271 150 - 440 K/uL    ____________________________________________ ____________________________________________   PROCEDURES  Procedure(s) performed:  Procedures    Critical Care performed: no ____________________________________________   INITIAL IMPRESSION / ASSESSMENT AND PLAN / ED COURSE  Pertinent labs & imaging results that were available during my care of the patient were reviewed by me and considered in my medical decision making (see chart for details).  DDX: Psychosis, delirium, medication effect, noncompliance, polysubstance abuse, Si, Hi, depression   Wynona CanesKelli K Su HiltRoberts is a 26 y.o. who presents to the ED with symptoms as described above. Patient without any SI or HI. She has no evidence of mania or hypomania. Seems to have good insight into her illness and I do think that it be appropriate to restart her on her previously scheduled medications as she is arranging follow-up with psychiatry as an outpatient.  Have discussed with the patient and available family all diagnostics and treatments performed thus far and all questions were answered to the best of my ability. The patient demonstrates understanding and agreement with plan.       ____________________________________________   FINAL CLINICAL IMPRESSION(S) / ED DIAGNOSES  Final diagnoses:  Anxiety  Depression, unspecified depression type      NEW MEDICATIONS STARTED DURING THIS VISIT:  New Prescriptions   TRAZODONE (DESYREL) 100 MG TABLET    Take 1 tablet (100 mg total) by mouth at bedtime.     Note:  This document was prepared using Dragon voice recognition software and may include unintentional dictation errors.    Willy Eddy, MD 04/15/17 1126

## 2017-04-15 NOTE — ED Triage Notes (Signed)
Pt to ed with c/o depression and anxiety.  Pt states out of meds for over a year, is making an appt with dr Maryruth [place].

## 2017-04-15 NOTE — ED Notes (Signed)
Patient reports recent move to the area. States she has recently been put in charge of her husbands kids and has had negative interactions with his ex-wife. Patient states she has been treated for depression and anxiety in the past but has been unable to refill medications for a year. Reports she wants to get set up with a psychiatrist or PCP so that she can be restarted on medications. Reports prescription of Buspar, Trazodone and Ativan in the past.

## 2017-05-16 ENCOUNTER — Encounter: Payer: Self-pay | Admitting: Emergency Medicine

## 2017-05-16 DIAGNOSIS — Z79899 Other long term (current) drug therapy: Secondary | ICD-10-CM | POA: Insufficient documentation

## 2017-05-16 DIAGNOSIS — I701 Atherosclerosis of renal artery: Secondary | ICD-10-CM | POA: Insufficient documentation

## 2017-05-16 DIAGNOSIS — Y999 Unspecified external cause status: Secondary | ICD-10-CM | POA: Insufficient documentation

## 2017-05-16 DIAGNOSIS — S0500XA Injury of conjunctiva and corneal abrasion without foreign body, unspecified eye, initial encounter: Secondary | ICD-10-CM | POA: Insufficient documentation

## 2017-05-16 DIAGNOSIS — Y939 Activity, unspecified: Secondary | ICD-10-CM | POA: Insufficient documentation

## 2017-05-16 DIAGNOSIS — Y929 Unspecified place or not applicable: Secondary | ICD-10-CM | POA: Insufficient documentation

## 2017-05-16 DIAGNOSIS — I10 Essential (primary) hypertension: Secondary | ICD-10-CM | POA: Insufficient documentation

## 2017-05-16 DIAGNOSIS — J45909 Unspecified asthma, uncomplicated: Secondary | ICD-10-CM | POA: Insufficient documentation

## 2017-05-16 DIAGNOSIS — X58XXXA Exposure to other specified factors, initial encounter: Secondary | ICD-10-CM | POA: Insufficient documentation

## 2017-05-16 NOTE — ED Triage Notes (Signed)
Patient states that she thinks that she scratched her left eye with her contact. Patient states that he eye has been bothering her for 2 days but when she removed her contact today that pain became worse.

## 2017-05-17 ENCOUNTER — Emergency Department
Admission: EM | Admit: 2017-05-17 | Discharge: 2017-05-17 | Disposition: A | Discharge status: Home / Self Care | Payer: Self-pay | Attending: Emergency Medicine | Admitting: Emergency Medicine

## 2017-05-17 DIAGNOSIS — S0502XA Injury of conjunctiva and corneal abrasion without foreign body, left eye, initial encounter: Secondary | ICD-10-CM

## 2017-05-17 MED ORDER — TETRACAINE HCL 0.5 % OP SOLN
OPHTHALMIC | Status: AC
  Administered 2017-05-17: 03:00:00
  Filled 2017-05-17: qty 4

## 2017-05-17 MED ORDER — OFLOXACIN 0.3 % OP SOLN: 1.0000 [drp] | Freq: Four times a day (QID) | OPHTHALMIC | 0 refills | Status: AC

## 2017-05-17 MED ORDER — OFLOXACIN 0.3 % OP SOLN
1.0000 [drp] | Freq: Four times a day (QID) | OPHTHALMIC | Status: DC
  Administered 2017-05-17: 1 [drp] via OPHTHALMIC
  Filled 2017-05-17: qty 5

## 2017-05-17 MED ORDER — FLUORESCEIN SODIUM 0.6 MG OP STRP
ORAL_STRIP | OPHTHALMIC | Status: AC
  Administered 2017-05-17: 1
  Filled 2017-05-17: qty 1

## 2017-05-17 NOTE — ED Triage Notes (Signed)
Patient to stat desk asking about wait time. Patient given an update. Patient in no acute distress at this time.

## 2017-05-17 NOTE — Discharge Instructions (Signed)
do not wear contact lenses for the next 10 days. Please follow-up your doctor for recheck in 2-3 days. Return to the emergency department for any changes in her vision.

## 2017-05-17 NOTE — ED Provider Notes (Signed)
[facility] Emergency Department Provider Note  Time seen: 2:55 AM  I have reviewed the triage vital signs and the nursing notes.   HISTORY  Chief Complaint Eye Pain    HPI Leah Bright is a 26 y.o. female With a past medical history of depression, hypertension, presents to the emergency department for left eye redness and discomfort over the past 2 days. According to the patient for the past 2 days she has been having redness and irritation of the left eye. Patient does wear contact lenses and feels like she might have scraped her eye. Denies any fever. Review of systems otherwise negative.  Past Medical History:  Diagnosis Date  . Anxiety   . Asthma   . Depression   . Hypertension   . Renal artery stenosis (HCC)   . Thyroid disease     There are no active problems to display for this patient.   Past Surgical History:  Procedure Laterality Date  . CHOLECYSTECTOMY    . LAPAROSCOPIC GASTRIC BANDING    . LAPAROSCOPIC REPAIR AND REMOVAL OF GASTRIC BAND    . TONSILLECTOMY      Prior to Admission medications   Medication Sig Start Date End Date Taking? Authorizing Provider  albuterol (PROVENTIL HFA;VENTOLIN HFA) 108 (90 Base) MCG/ACT inhaler Inhale 2 puffs into the lungs every 6 (six) hours as needed for wheezing or shortness of breath. 07/20/16   Tommi Rumps, PA-C  benzonatate (TESSALON PERLES) 100 MG capsule Take one or 2 tablets every 8 hours for coughing. 07/20/16   Tommi Rumps, PA-C  chlorpheniramine-HYDROcodone (TUSSIONEX PENNKINETIC ER) 10-8 MG/5ML SUER Take 5 mLs by mouth 2 (two) times daily. 09/19/16   Rebecka Apley, MD  indomethacin (INDOCIN) 50 MG capsule Take 1 capsule (50 mg total) by mouth 3 (three) times daily as needed. 11/27/16   Darci Current, MD  levothyroxine (SYNTHROID, LEVOTHROID) 100 MCG tablet Take 100 mcg by mouth daily before breakfast.    [provider]  lidocaine (LIDODERM) 5 % Place 1 patch  onto the skin daily. Remove & Discard patch within 12 hours or as directed by MD 01/31/17 01/31/18  Enid Derry, PA-C  lidocaine (XYLOCAINE) 2 % solution Use as directed 10 mLs in the mouth or throat as needed for mouth pain. 01/01/17   Enid Derry, PA-C  lisinopril (PRINIVIL,ZESTRIL) 40 MG tablet Take 40 mg by mouth daily.    [provider]  loperamide (IMODIUM A-D) 2 MG tablet Take 1 tablet (2 mg total) by mouth 4 (four) times daily as needed for diarrhea or loose stools. 09/11/16   Minna Antis, MD  LORazepam (ATIVAN) 0.5 MG tablet Take 0.5 mg by mouth every 8 (eight) hours as needed for anxiety.    [provider]  ondansetron (ZOFRAN) 4 MG tablet Take 1 tablet (4 mg total) by mouth daily as needed for nausea or vomiting. 01/01/17 01/01/18  Enid Derry, PA-C  traMADol (ULTRAM) 50 MG tablet Take 1 tablet (50 mg total) by mouth every 6 (six) hours as needed. 09/11/16 09/11/17  Minna Antis, MD  traZODone (DESYREL) 100 MG tablet Take 1 tablet (100 mg total) by mouth at bedtime. 04/15/17 04/29/17  Willy Eddy, MD    Allergies  Allergen Reactions  . Codeine   . Pepto-Bismol [Bismuth]   . Toradol [Ketorolac Tromethamine]     No family history on file.  Social History Social History  Substance Use Topics  . Smoking status: Never Smoker  .  Smokeless tobacco: Never Used  . Alcohol use Yes     Comment: rarely    Review of Systems Constitutional: Negative for fever. Eyes: normal vision, red eye with tearing and discomfort Cardiovascular: Negative for chest pain. Respiratory: Negative for shortness of breath. Gastrointestinal: Negative for abdominal pain Neurological: Negative for headache All other ROS negative  ____________________________________________   PHYSICAL EXAM:  VITAL SIGNS: ED Triage Vitals  Enc Vitals Group     BP 05/17/17 0001 (!) 190/104     Pulse Rate 05/17/17 0001 90     Resp 05/17/17 0001 16     Temp 05/17/17 0001 98 F (36.7  C)     Temp Source 05/17/17 0001 Oral     SpO2 05/17/17 0001 98 %     Weight 05/16/17 2353 289 lb (131.1 kg)     Height 05/16/17 2353  (1.651 m)     Head Circumference --      Peak Flow --      Pain Score --      Pain Loc --      Pain Edu? --      Excl. in GC? --     Constitutional: Alert and oriented. Well appearing and in no distress. Eyes: Normal right eye. Left eye is tearing with conjunctival injection. ENT   Head: Normocephalic and atraumatic.   Mouth/Throat: Mucous membranes are moist. Cardiovascular: Normal rate, regular rhythm. No murmur Respiratory: Normal respiratory effort without tachypnea nor retractions. Breath sounds are clear  Gastrointestinal: Soft and nontender. No distention.  Musculoskeletal: Nontender with normal range of motion in all extremities.  Neurologic:  Normal speech and language. No gross focal neurologic deficits  Skin:  Skin is warm, dry and intact.  Psychiatric: Mood and affect are normal.   ____________________________________________   INITIAL IMPRESSION / ASSESSMENT AND PLAN / ED COURSE  Pertinent labs & imaging results that were available during my care of the patient were reviewed by me and considered in my medical decision making (see chart for details).  patient presents to the emergency department for left eye discomfort 2 days. On exam the patient has tearing with conjunctival injection but otherwise EOMI, Perl. Tetracaine used, eyelids everted no foreign bodies identified. Fluroscein exam performed, patient does have a scleral abrasion. We will place the patient on ofloxacin drops. I discussed with the patient no contact lenses for the next 10 days. She is to follow-up with her doctor for recheck. I also discussed return precautions.  ____________________________________________   FINAL CLINICAL IMPRESSION(S) / ED DIAGNOSES  scleral abrasion    Minna Antis, MD 05/17/17 [phone]

## 2017-06-30 ENCOUNTER — Emergency Department
Admission: EM | Admit: 2017-06-30 | Discharge: 2017-06-30 | Disposition: A | Discharge status: Home / Self Care | Payer: Self-pay | Attending: Emergency Medicine | Admitting: Emergency Medicine

## 2017-06-30 ENCOUNTER — Encounter: Payer: Self-pay | Admitting: Emergency Medicine

## 2017-06-30 DIAGNOSIS — I10 Essential (primary) hypertension: Secondary | ICD-10-CM | POA: Insufficient documentation

## 2017-06-30 DIAGNOSIS — B9789 Other viral agents as the cause of diseases classified elsewhere: Secondary | ICD-10-CM

## 2017-06-30 DIAGNOSIS — Z79899 Other long term (current) drug therapy: Secondary | ICD-10-CM | POA: Insufficient documentation

## 2017-06-30 DIAGNOSIS — J45909 Unspecified asthma, uncomplicated: Secondary | ICD-10-CM | POA: Insufficient documentation

## 2017-06-30 DIAGNOSIS — J069 Acute upper respiratory infection, unspecified: Secondary | ICD-10-CM | POA: Insufficient documentation

## 2017-06-30 DIAGNOSIS — B349 Viral infection, unspecified: Secondary | ICD-10-CM | POA: Insufficient documentation

## 2017-06-30 MED ORDER — TRAMADOL HCL 50 MG PO TABS: 50.0000 mg | ORAL_TABLET | Freq: Four times a day (QID) | ORAL | 0 refills | Status: DC | PRN

## 2017-06-30 MED ORDER — PSEUDOEPH-BROMPHEN-DM 30-2-10 MG/5ML PO SYRP: 5.0000 mL | ORAL_SOLUTION | Freq: Four times a day (QID) | ORAL | 0 refills | Status: DC | PRN

## 2017-06-30 MED ORDER — BENZONATATE 100 MG PO CAPS
200.0000 mg | ORAL_CAPSULE | Freq: Once | ORAL | Status: AC
  Administered 2017-06-30: 200 mg via ORAL
  Filled 2017-06-30: qty 2

## 2017-06-30 MED ORDER — METHYLPREDNISOLONE 4 MG PO TBPK: ORAL_TABLET | ORAL | 0 refills | Status: DC

## 2017-06-30 NOTE — ED Triage Notes (Signed)
Patient complaining of cough, sore throat, and body aches.  Non-productive cough.  Temp. 101.7 Sunday night.  Taking Tylenol Cold and Flu Extra Strength has helped per patient.  Afebrile currently.  Used Albuterol nebulizer last night with relief.  Hx of asthma.

## 2017-06-30 NOTE — ED Provider Notes (Signed)
[facility] Emergency Department Provider Note   ____________________________________________   First MD Initiated Contact with Patient 06/30/17 1331     (approximate)  I have reviewed the triage vital signs and the nursing notes.   HISTORY  Chief Complaint Cough    HPI Leah Bright is a 26 y.o. female patient complain of nasal congestion, sore throat, cough, and body aches for 2 days. Patient states cough is nonproductive. Patient cough increase with deep inspirations. Patient states she had a fever 2 nights ago for temperature 101.7. Patient states mild relief taking Tylenol Cold and flu. Patient also states use albuterol inhaler last night with moderate relief. Patient does have a history of asthma.Patient rates her pain discomfort as a 5/10. Patient described the pain as "achy".   Past Medical History:  Diagnosis Date  . Anxiety   . Asthma   . Depression   . Hypertension   . Renal artery stenosis (HCC)   . Thyroid disease     There are no active problems to display for this patient.   Past Surgical History:  Procedure Laterality Date  . CHOLECYSTECTOMY    . LAPAROSCOPIC GASTRIC BANDING    . LAPAROSCOPIC REPAIR AND REMOVAL OF GASTRIC BAND    . TONSILLECTOMY      Prior to Admission medications   Medication Sig Start Date End Date Taking? Authorizing Provider  albuterol (PROVENTIL HFA;VENTOLIN HFA) 108 (90 Base) MCG/ACT inhaler Inhale 2 puffs into the lungs every 6 (six) hours as needed for wheezing or shortness of breath. 07/20/16   Tommi Rumps, PA-C  benzonatate (TESSALON PERLES) 100 MG capsule Take one or 2 tablets every 8 hours for coughing. 07/20/16   Tommi Rumps, PA-C  brompheniramine-pseudoephedrine-DM 30-2-10 MG/5ML syrup Take 5 mLs by mouth 4 (four) times daily as needed. 06/30/17   Joni Reining, PA-C  chlorpheniramine-HYDROcodone (TUSSIONEX PENNKINETIC ER) 10-8 MG/5ML SUER Take 5 mLs by mouth 2 (two) times daily.  09/19/16   Rebecka Apley, MD  indomethacin (INDOCIN) 50 MG capsule Take 1 capsule (50 mg total) by mouth 3 (three) times daily as needed. 11/27/16   Darci Current, MD  levothyroxine (SYNTHROID, LEVOTHROID) 100 MCG tablet Take 100 mcg by mouth daily before breakfast.    [provider]  lidocaine (LIDODERM) 5 % Place 1 patch onto the skin daily. Remove & Discard patch within 12 hours or as directed by MD 01/31/17 01/31/18  Enid Derry, PA-C  lidocaine (XYLOCAINE) 2 % solution Use as directed 10 mLs in the mouth or throat as needed for mouth pain. 01/01/17   Enid Derry, PA-C  lisinopril (PRINIVIL,ZESTRIL) 40 MG tablet Take 40 mg by mouth daily.    [provider]  loperamide (IMODIUM A-D) 2 MG tablet Take 1 tablet (2 mg total) by mouth 4 (four) times daily as needed for diarrhea or loose stools. 09/11/16   Minna Antis, MD  LORazepam (ATIVAN) 0.5 MG tablet Take 0.5 mg by mouth every 8 (eight) hours as needed for anxiety.    [provider]  methylPREDNISolone (MEDROL DOSEPAK) 4 MG TBPK tablet Take Tapered dose as directed 06/30/17   Joni Reining, PA-C  ondansetron (ZOFRAN) 4 MG tablet Take 1 tablet (4 mg total) by mouth daily as needed for nausea or vomiting. 01/01/17 01/01/18  Enid Derry, PA-C  traMADol (ULTRAM) 50 MG tablet Take 1 tablet (50 mg total) by mouth every 6 (six) hours as needed. 09/11/16 09/11/17  Minna Antis, MD  traMADol (ULTRAM) 50 MG tablet Take 1 tablet (50 mg total) by mouth every 6 (six) hours as needed for moderate pain. 06/30/17   Joni Reining, PA-C  traZODone (DESYREL) 100 MG tablet Take 1 tablet (100 mg total) by mouth at bedtime. 04/15/17 04/29/17  Willy Eddy, MD    Allergies Codeine; Pepto-bismol [bismuth]; and Toradol [ketorolac tromethamine]  No family history on file.  Social History Social History  Substance Use Topics  . Smoking status: Never Smoker  . Smokeless tobacco: Never Used  . Alcohol use Yes      Comment: rarely    Review of Systems  Constitutional: No fever/chills Eyes: No visual changes. ENT: No sore throat. Cardiovascular: Denies chest pain. Respiratory: Denies shortness of breath. Gastrointestinal: No abdominal pain.  No nausea, no vomiting.  No diarrhea.  No constipation. Genitourinary: Negative for dysuria. Musculoskeletal: Negative for back pain. Skin: Negative for rash. Neurological: Negative for headaches, focal weakness or numbness. Psychiatric:Anxiety and depression Endocrine:Hypertension and hypothyroidism. Allergic/Immunilogical: See medication list ____________________________________________   PHYSICAL EXAM:  VITAL SIGNS: ED Triage Vitals  Enc Vitals Group     BP 06/30/17 1245 (!) 152/91     Pulse Rate 06/30/17 1245 66     Resp 06/30/17 1245 20     Temp 06/30/17 1245 98.6 F (37 C)     Temp Source 06/30/17 1245 Oral     SpO2 06/30/17 1245 97 %     Weight 06/30/17 1247 278 lb (126.1 kg)     Height 06/30/17 1247 5\' 4"  (1.626 m)     Head Circumference --      Peak Flow --      Pain Score 06/30/17 1244 5     Pain Loc --      Pain Edu? --      Excl. in GC? --    Constitutional: Alert and oriented. Well appearing and in no acute distress. Nose: Edematous nasal turbinates with thick rhinorrhea Mouth/Throat: Mucous membranes are moist.  Oropharynx non-erythematous. Postnasal drainage Neck: No stridor.  No cervical spine tenderness to palpation. Hematological/Lymphatic/Immunilogical: No cervical lymphadenopathy. Cardiovascular: Normal rate, regular rhythm. Grossly normal heart sounds.  Good peripheral circulation. Respiratory: Normal respiratory effort.  No retractions. Lungs CTAB. Neurologic:  Normal speech and language. No gross focal neurologic deficits are appreciated. No gait instability. Skin:  Skin is warm, dry and intact. No rash noted. Psychiatric: Mood and affect are normal. Speech and behavior are  normal.  ____________________________________________   LABS (all labs ordered are listed, but only abnormal results are displayed)  Labs Reviewed - No data to display ____________________________________________  EKG   ____________________________________________  RADIOLOGY  No results found.  ____________________________________________   PROCEDURES  Procedure(s) performed: None  Procedures  Critical Care performed: No  ____________________________________________   INITIAL IMPRESSION / ASSESSMENT AND PLAN / ED COURSE  As part of my medical decision making, I reviewed the following data within the electronic MEDICAL RECORD NUMBER    Viral upper respiratory infection with cough. Patient given discharge Instructions. Patient given a work note and advised to take medication as directed. Patient [facility].      ____________________________________________   FINAL CLINICAL IMPRESSION(S) / ED DIAGNOSES  Final diagnoses:  Viral URI with cough      NEW MEDICATIONS STARTED DURING THIS VISIT:  New Prescriptions   BROMPHENIRAMINE-PSEUDOEPHEDRINE-DM 30-2-10 MG/5ML SYRUP    Take 5 mLs by mouth 4 (four) times daily as needed.   METHYLPREDNISOLONE (MEDROL  DOSEPAK) 4 MG TBPK TABLET    Take Tapered dose as directed   TRAMADOL (ULTRAM) 50 MG TABLET    Take 1 tablet (50 mg total) by mouth every 6 (six) hours as needed for moderate pain.     Note:  This document was prepared using Dragon voice recognition software and may include unintentional dictation errors.    Joni ReiningSmith, Ronald K, PA-C 06/30/17 1343    Emily FilbertWilliams, Jonathan E, MD 06/30/17 [phone]

## 2017-07-09 ENCOUNTER — Encounter: Payer: Self-pay | Admitting: Emergency Medicine

## 2017-07-09 ENCOUNTER — Other Ambulatory Visit: Payer: Self-pay

## 2017-07-09 ENCOUNTER — Emergency Department
Admission: EM | Admit: 2017-07-09 | Discharge: 2017-07-09 | Disposition: A | Discharge status: Home / Self Care | Payer: Self-pay | Attending: Emergency Medicine | Admitting: Emergency Medicine

## 2017-07-09 DIAGNOSIS — I10 Essential (primary) hypertension: Secondary | ICD-10-CM | POA: Insufficient documentation

## 2017-07-09 DIAGNOSIS — G43C0 Periodic headache syndromes in child or adult, not intractable: Secondary | ICD-10-CM | POA: Insufficient documentation

## 2017-07-09 DIAGNOSIS — J45909 Unspecified asthma, uncomplicated: Secondary | ICD-10-CM | POA: Insufficient documentation

## 2017-07-09 LAB — URINALYSIS, COMPLETE (UACMP) WITH MICROSCOPIC
BACTERIA UA: NONE SEEN
BILIRUBIN URINE: NEGATIVE
Glucose, UA: NEGATIVE mg/dL
Hgb urine dipstick: NEGATIVE
KETONES UR: NEGATIVE mg/dL
Leukocytes, UA: NEGATIVE
NITRITE: NEGATIVE
Protein, ur: NEGATIVE mg/dL
SPECIFIC GRAVITY, URINE: 1.016 (ref 1.005–1.030)
pH: 5 (ref 5.0–8.0)

## 2017-07-09 LAB — POCT PREGNANCY, URINE: PREG TEST UR: NEGATIVE

## 2017-07-09 MED ORDER — DEXAMETHASONE SODIUM PHOSPHATE 10 MG/ML IJ SOLN
INTRAMUSCULAR | Status: AC
  Administered 2017-07-09: 10 mg via INTRAVENOUS
  Filled 2017-07-09: qty 1

## 2017-07-09 MED ORDER — METOCLOPRAMIDE HCL 5 MG/ML IJ SOLN
10.0000 mg | Freq: Once | INTRAMUSCULAR | Status: AC
  Administered 2017-07-09: 10 mg via INTRAVENOUS
  Filled 2017-07-09: qty 2

## 2017-07-09 MED ORDER — SODIUM CHLORIDE 0.9 % IV BOLUS (SEPSIS)
1000.0000 mL | Freq: Once | INTRAVENOUS | Status: AC
  Administered 2017-07-09: 1000 mL via INTRAVENOUS

## 2017-07-09 MED ORDER — DEXAMETHASONE SODIUM PHOSPHATE 10 MG/ML IJ SOLN
10.0000 mg | Freq: Once | INTRAMUSCULAR | Status: AC
  Administered 2017-07-09: 10 mg via INTRAVENOUS
  Filled 2017-07-09: qty 1

## 2017-07-09 MED ORDER — BUTALBITAL-APAP-CAFFEINE 50-325-40 MG PO TABS: 1.0000 | ORAL_TABLET | Freq: Four times a day (QID) | ORAL | 0 refills | Status: DC | PRN

## 2017-07-09 MED ORDER — DIPHENHYDRAMINE HCL 50 MG/ML IJ SOLN
25.0000 mg | Freq: Once | INTRAMUSCULAR | Status: AC
  Administered 2017-07-09: 25 mg via INTRAVENOUS
  Filled 2017-07-09: qty 1

## 2017-07-09 NOTE — ED Notes (Signed)
Pt discharged home after verbalizing understanding of discharge instructions; nad noted. 

## 2017-07-09 NOTE — ED Provider Notes (Signed)
[facility] Emergency Department Provider Note  ____________________________________________   None    (approximate)  I have reviewed the triage vital signs and the nursing notes.   HISTORY  Chief Complaint Migraine   HPI Leah Bright is a 26 y.o. female is here with complaint of headache.  Patient has a history of "migraines". [place] F[place]icet. She currently does not have a PCP and continues to have migraines. Her current headache has been going on for 5 days. She complains of nausea,photophobia, and right-sided posterior headache. Patient states that her headache currently as a 10 over 10. Patient was able to drive herself to the emergency department.   Past Medical History:  Diagnosis Date  . Anxiety   . Asthma   . Depression   . Hypertension   . Renal artery stenosis (HCC)   . Thyroid disease     There are no active problems to display for this patient.   Past Surgical History:  Procedure Laterality Date  . CHOLECYSTECTOMY    . LAPAROSCOPIC GASTRIC BANDING    . LAPAROSCOPIC REPAIR AND REMOVAL OF GASTRIC BAND    . TONSILLECTOMY      Prior to Admission medications   Medication Sig Start Date End Date Taking? Authorizing Provider  albuterol (PROVENTIL HFA;VENTOLIN HFA) 108 (90 Base) MCG/ACT inhaler Inhale 2 puffs into the lungs every 6 (six) hours as needed for wheezing or shortness of breath. 07/20/16   Tommi RumpsSummers, Yarelin Reichardt L, PA-C  butalbital-acetaminophen-caffeine (FIORICET, ESGIC) [phone] MG tablet Take 1 tablet every 6 (six) hours as needed by mouth for headache. 07/09/17 07/09/18  Tommi RumpsSummers, Denna Fryberger L, PA-C  levothyroxine (SYNTHROID, LEVOTHROID) 100 MCG tablet Take 100 mcg by mouth daily before breakfast.    [provider]  lisinopril (PRINIVIL,ZESTRIL) 40 MG tablet Take 40 mg by mouth daily.    [provider]  LORazepam (ATIVAN)  0.5 MG tablet Take 0.5 mg by mouth every 8 (eight) hours as needed for anxiety.    [provider]  traZODone (DESYREL) 100 MG tablet Take 1 tablet (100 mg total) by mouth at bedtime. 04/15/17 04/29/17  Willy Eddyobinson, Patrick, MD    Allergies Codeine; Pepto-bismol [bismuth]; and Toradol [ketorolac tromethamine]  No family history on file.  Social History Social History   Tobacco Use  . Smoking status: Never Smoker  . Smokeless tobacco: Never Used  Substance Use Topics  . Alcohol use: Yes    Comment: rarely  . Drug use: No    Review of Systems Constitutional: No fever/chills Eyes: No visual changes. ENT: No sore throat. Denies sinus pain. Cardiovascular: Denies chest pain. Respiratory: Denies shortness of breath. Gastrointestinal: No abdominal pain.  positivenausea, no vomiting.   Genitourinary: Negative for dysuria. Musculoskeletal: Negative for back pain. Skin: Negative for rash. Neurological: positive for headaches,negative focal weakness or numbness. ____________________________________________   PHYSICAL EXAM:  VITAL SIGNS: ED Triage Vitals  Enc Vitals Group     BP 07/09/17 0807 (!) 177/100     Pulse Rate 07/09/17 0807 87     Resp 07/09/17 0807 20     Temp 07/09/17 0807 98.3 F (36.8 C)     Temp Source 07/09/17 0807 Oral     SpO2 07/09/17 0807 97 %     Weight 07/09/17 0806 278 lb (126.1 kg)     Height --      Head Circumference --  Peak Flow --      Pain Score 07/09/17 0806 10     Pain Loc --      Pain Edu? --      Excl. in GC? --    Constitutional: Alert and oriented. Well appearing and in no acute distress.  Patient photophobic. Eyes: Conjunctivae are normal. PERRL. EOMI. Head: Atraumatic. Nose: No congestion/rhinnorhea. nontender sinuses to percussion. Mouth/Throat: Mucous membranes are moist.  Oropharynx non-erythematous. No posterior drainage. Neck: No stridor.  No tenderness on palpation cervical spine posteriorly. Range of motion is  without restriction. Hematological/Lymphatic/Immunilogical: No cervical lymphadenopathy. Cardiovascular: Normal rate, regular rhythm. Grossly normal heart sounds.  Good peripheral circulation. Respiratory: Normal respiratory effort.  No retractions. Lungs CTAB. Musculoskeletal: there is upper and lower extremities without any difficulty. Normal gait was noted. Neurologic:  Normal speech and language. No gross focal neurologic deficits are appreciated. Cranial nerves II through XII grossly intact. Normal gait was noted. Skin:  Skin is warm, dry and intact. No rash noted. Psychiatric: Mood and affect are normal. Speech and behavior are normal.  ____________________________________________   LABS (all labs ordered are listed, but only abnormal results are displayed)  Labs Reviewed  URINALYSIS, COMPLETE (UACMP) WITH MICROSCOPIC - Abnormal; Notable for the following components:      Result Value   Color, Urine YELLOW (*)    APPearance CLEAR (*)    Squamous Epithelial / LPF 0-5 (*)    All other components within normal limits  POC URINE PREG, ED  POCT PREGNANCY, URINE     PROCEDURES  Procedure(s) performed: None  Procedures  Critical Care performed: No  ____________________________________________   INITIAL IMPRESSION / ASSESSMENT AND PLAN / ED COURSE  As part of my medical decision making, I reviewed the following data within the electronic MEDICAL RECORD NUMBER Notes from prior ED visits and [place] Controlled Substance Database  Records were reviewed and patient had a normal CT in 2017. Patient has not followed up with a neurologist at this time. CT findings for 2017 was discussed with patient. Patient did get relief of her headache with theIV medication given while in the ED. Patient was also given a limited number of Fioricet tablets. Husband was given a list of clinics in the area that charge Per sliding scale so that she can continue follow-up for her headaches. Patient was improved  prior to discharge.  ____________________________________________   FINAL CLINICAL IMPRESSION(S) / ED DIAGNOSES  Final diagnoses:  Periodic headache syndrome, not intractable     ED Discharge Orders        Ordered    butalbital-acetaminophen-caffeine (FIORICET, ESGIC) 50-325-40 MG tablet  Every 6 hours PRN     07/09/17 1148       Note:  This document was prepared using Dragon voice recognition software and may include unintentional dictation errors.    Tommi RumpsSummers, Mackenzie Lia L, PA-C 07/09/17 1603    Minna AntisPaduchowski, Kevin, MD 07/10/17 [phone]

## 2017-07-09 NOTE — ED Notes (Signed)
Pt c/o headache, hx of migraines. Pt states sensitivity to sound, denies blurred vision

## 2017-07-09 NOTE — ED Triage Notes (Signed)
Pt with headache, pt is squinting, states she is out of her fioricet.

## 2017-07-09 NOTE — Discharge Instructions (Signed)
Begin taking Fioricet 1 tablet every 6 hours as needed for headache. You will need to establish a primary care provider. A list of clinics are listed on your discharge papers that discharge on a sliding scale. Call each of these to see who is taking new patients.   The clinic is also listed on your discharge papers.

## 2017-07-26 ENCOUNTER — Emergency Department
Admission: EM | Admit: 2017-07-26 | Discharge: 2017-07-26 | Disposition: A | Discharge status: Home / Self Care | Payer: Self-pay | Attending: Emergency Medicine | Admitting: Emergency Medicine

## 2017-07-26 ENCOUNTER — Emergency Department: Payer: Self-pay

## 2017-07-26 ENCOUNTER — Encounter: Payer: Self-pay | Admitting: Emergency Medicine

## 2017-07-26 DIAGNOSIS — R102 Pelvic and perineal pain: Secondary | ICD-10-CM | POA: Insufficient documentation

## 2017-07-26 DIAGNOSIS — I10 Essential (primary) hypertension: Secondary | ICD-10-CM | POA: Insufficient documentation

## 2017-07-26 DIAGNOSIS — J45909 Unspecified asthma, uncomplicated: Secondary | ICD-10-CM | POA: Insufficient documentation

## 2017-07-26 DIAGNOSIS — R109 Unspecified abdominal pain: Secondary | ICD-10-CM

## 2017-07-26 DIAGNOSIS — Z79899 Other long term (current) drug therapy: Secondary | ICD-10-CM | POA: Insufficient documentation

## 2017-07-26 DIAGNOSIS — R252 Cramp and spasm: Secondary | ICD-10-CM | POA: Insufficient documentation

## 2017-07-26 LAB — BASIC METABOLIC PANEL
ANION GAP: 8 (ref 5–15)
BUN: 12 mg/dL (ref 6–20)
CALCIUM: 9 mg/dL (ref 8.9–10.3)
CO2: 22 mmol/L (ref 22–32)
CREATININE: 0.82 mg/dL (ref 0.44–1.00)
Chloride: 110 mmol/L (ref 101–111)
Glucose, Bld: 185 mg/dL — ABNORMAL HIGH (ref 65–99)
Potassium: 3.9 mmol/L (ref 3.5–5.1)
Sodium: 140 mmol/L (ref 135–145)

## 2017-07-26 LAB — URINALYSIS, ROUTINE W REFLEX MICROSCOPIC
BILIRUBIN URINE: NEGATIVE
Bacteria, UA: NONE SEEN
GLUCOSE, UA: NEGATIVE mg/dL
Ketones, ur: NEGATIVE mg/dL
Leukocytes, UA: NEGATIVE
NITRITE: NEGATIVE
PROTEIN: 100 mg/dL — AB
Specific Gravity, Urine: 1.039 — ABNORMAL HIGH (ref 1.005–1.030)
pH: 5 (ref 5.0–8.0)

## 2017-07-26 LAB — CBC
HEMATOCRIT: 35.8 % (ref 35.0–47.0)
Hemoglobin: 12.5 g/dL (ref 12.0–16.0)
MCH: 28.1 pg (ref 26.0–34.0)
MCHC: 34.8 g/dL (ref 32.0–36.0)
MCV: 80.8 fL (ref 80.0–100.0)
PLATELETS: 241 10*3/uL (ref 150–440)
RBC: 4.43 MIL/uL (ref 3.80–5.20)
RDW: 14.1 % (ref 11.5–14.5)
WBC: 6.2 10*3/uL (ref 3.6–11.0)

## 2017-07-26 LAB — POCT PREGNANCY, URINE: Preg Test, Ur: NEGATIVE

## 2017-07-26 MED ORDER — HYDROCODONE-ACETAMINOPHEN 5-325 MG PO TABS: 1.0000 | ORAL_TABLET | ORAL | 0 refills | Status: DC | PRN

## 2017-07-26 NOTE — Discharge Instructions (Signed)
Please take your pain medication as needed, as written.  Return to the emergency department for any fever or worsening abdominal pain.  Otherwise please follow-up with a primary care doctor in the next 1-2 days for recheck/reevaluation.

## 2017-07-26 NOTE — ED Provider Notes (Signed)
[facility] Emergency Department Provider Note  Time seen: 3:47 PM  I have reviewed the triage vital signs and the nursing notes.   HISTORY  Chief Complaint Abdominal Cramping    HPI Roland RackKelli K Cichowski is a 26 y.o. female with a past medical history of anxiety, asthma, depression, hypertension, presents to the emergency department for lower abdominal cramping and vaginal bleeding.  According to the patient she has irregular periods, did not have a period last month but her period began approximately 5 days ago.  For the past 5 days she has had worsening lower abdominal cramps.  Patient states a history of painful periods but states this is the worst she has had with her period.  Describes the pain as a cramping and occasional sharp pain.  Denies any fever.  States nausea at times but denies vomiting or diarrhea.  Denies dysuria.  Describes her pain as moderate currently.  Past Medical History:  Diagnosis Date  . Anxiety   . Asthma   . Depression   . Hypertension   . Renal artery stenosis (HCC)   . Thyroid disease     There are no active problems to display for this patient.   Past Surgical History:  Procedure Laterality Date  . CHOLECYSTECTOMY    . LAPAROSCOPIC GASTRIC BANDING    . LAPAROSCOPIC REPAIR AND REMOVAL OF GASTRIC BAND    . TONSILLECTOMY      Prior to Admission medications   Medication Sig Start Date End Date Taking? Authorizing Provider  albuterol (PROVENTIL HFA;VENTOLIN HFA) 108 (90 Base) MCG/ACT inhaler Inhale 2 puffs into the lungs every 6 (six) hours as needed for wheezing or shortness of breath. 07/20/16   Tommi RumpsSummers, Rhonda L, PA-C  butalbital-acetaminophen-caffeine (FIORICET, ESGIC) [id number] MG tablet Take 1 tablet every 6 (six) hours as needed by mouth for headache. 07/09/17 07/09/18  Tommi RumpsSummers, Rhonda L, PA-C  levothyroxine (SYNTHROID, LEVOTHROID) 100 MCG tablet Take 100 mcg by mouth daily before breakfast.    [provider]   lisinopril (PRINIVIL,ZESTRIL) 40 MG tablet Take 40 mg by mouth daily.    [provider]  LORazepam (ATIVAN) 0.5 MG tablet Take 0.5 mg by mouth every 8 (eight) hours as needed for anxiety.    [provider]  traZODone (DESYREL) 100 MG tablet Take 1 tablet (100 mg total) by mouth at bedtime. 04/15/17 04/29/17  Willy Eddyobinson, Patrick, MD    Allergies  Allergen Reactions  . Codeine   . Pepto-Bismol [Bismuth]   . Toradol [Ketorolac Tromethamine]     History reviewed. No pertinent family history.  Social History Social History   Tobacco Use  . Smoking status: Never Smoker  . Smokeless tobacco: Never Used  Substance Use Topics  . Alcohol use: Yes    Comment: rarely  . Drug use: No    Review of Systems Constitutional: Negative for fever. Cardiovascular: Negative for chest pain. Respiratory: Negative for shortness of breath. Gastrointestinal: No abdominal cramping.  Positive nausea.  Negative for vomiting or diarrhea Genitourinary: Negative for dysuria.  Negative for discharge.  Positive for bleeding. All other ROS negative  ____________________________________________   PHYSICAL EXAM:  VITAL SIGNS: ED Triage Vitals  Enc Vitals Group     BP 07/26/17 1444 (!) 162/105     Pulse Rate 07/26/17 1444 85     Resp 07/26/17 1444 18     Temp 07/26/17 1444 98.3 F (36.8 C)     Temp Source 07/26/17 1444 Oral     SpO2 07/26/17  1444 95 %     Weight 07/26/17 1434 278 lb (126.1 kg)     Height 07/26/17 1434 5\' 4"  (1.626 m)     Head Circumference --      Peak Flow --      Pain Score 07/26/17 1434 10     Pain Loc --      Pain Edu? --      Excl. in GC? --     Constitutional: Alert and oriented.  Mild distress due to abdominal cramping. Eyes: Normal exam ENT   Head: Normocephalic and atraumatic   Mouth/Throat: Mucous membranes are moist. Cardiovascular: Normal rate, regular rhythm. No murmur Respiratory: Normal respiratory effort without tachypnea nor  retractions. Breath sounds are clear Gastrointestinal: Soft and nontender. No distention.   Musculoskeletal: Nontender with normal range of motion in all extremities. Neurologic:  Normal speech and language. No gross focal neurologic deficits  Skin:  Skin is warm, dry and intact.  Psychiatric: Mood and affect are normal.   ____________________________________________   RADIOLOGY  Ultrasound severely limited due to body habitus.  ____________________________________________   INITIAL IMPRESSION / ASSESSMENT AND PLAN / ED COURSE  Pertinent labs & imaging results that were available during my care of the patient were reviewed by me and considered in my medical decision making (see chart for details).  Department for lower abdominal cramping and vaginal bleeding for the past 5 days.  Denies any fever.  States the cramping is across her entire lower abdomen but somewhat worse on the right side.  States a history of pain full menstrual periods in the past.  Differential would include dysmenorrhea, ovarian cyst, hemorrhagic cyst, less likely appendicitis given no fever, normal white blood cell count and correlation with her menstrual cycle.  Patient's lab work is reassuring with a normal white blood cell count and normal chemistry.  Urinalysis and pregnancy test are pending.  Given the patient's increased cramping over baseline we will obtain an ultrasound to further evaluate.  The patient does have tenderness across the entire lower abdomen no significant tenderness over McBurney's point.  I reviewed the patient's records including 11 ER visits from 2018, however they are for different reasons and are largely noncontributory to today's ER visit.  Ultrasound very limited due to body habitus but no definitive abnormalities identified.  We will place the patient on a short course of pain medication in addition to NSAIDs.  We will have the patient follow-up with her doctor tomorrow for  recheck/reevaluation.  I discussed return precautions for any fever or worsening pain.  Patient agreeable to this plan. ____________________________________________   FINAL CLINICAL IMPRESSION(S) / ED DIAGNOSES  Lower abdominal pain/cramping    Minna AntisPaduchowski, Rikia Sukhu, MD 07/26/17 1810

## 2017-07-26 NOTE — ED Triage Notes (Signed)
Patient states she is having a heavier than normal period. Pt states "severe period cramping with nausea"

## 2017-10-16 ENCOUNTER — Emergency Department
Admission: EM | Admit: 2017-10-16 | Discharge: 2017-10-16 | Disposition: A | Discharge status: Home / Self Care | Payer: Self-pay | Attending: Emergency Medicine | Admitting: Emergency Medicine

## 2017-10-16 ENCOUNTER — Other Ambulatory Visit: Payer: Self-pay

## 2017-10-16 ENCOUNTER — Emergency Department: Payer: Self-pay

## 2017-10-16 ENCOUNTER — Encounter: Payer: Self-pay | Admitting: Emergency Medicine

## 2017-10-16 DIAGNOSIS — I10 Essential (primary) hypertension: Secondary | ICD-10-CM | POA: Insufficient documentation

## 2017-10-16 DIAGNOSIS — F419 Anxiety disorder, unspecified: Secondary | ICD-10-CM | POA: Insufficient documentation

## 2017-10-16 DIAGNOSIS — J45909 Unspecified asthma, uncomplicated: Secondary | ICD-10-CM | POA: Insufficient documentation

## 2017-10-16 DIAGNOSIS — Z9884 Bariatric surgery status: Secondary | ICD-10-CM | POA: Insufficient documentation

## 2017-10-16 DIAGNOSIS — J069 Acute upper respiratory infection, unspecified: Secondary | ICD-10-CM | POA: Insufficient documentation

## 2017-10-16 DIAGNOSIS — Z9049 Acquired absence of other specified parts of digestive tract: Secondary | ICD-10-CM | POA: Insufficient documentation

## 2017-10-16 DIAGNOSIS — F329 Major depressive disorder, single episode, unspecified: Secondary | ICD-10-CM | POA: Insufficient documentation

## 2017-10-16 DIAGNOSIS — Z79899 Other long term (current) drug therapy: Secondary | ICD-10-CM | POA: Insufficient documentation

## 2017-10-16 LAB — INFLUENZA PANEL BY PCR (TYPE A & B)
Influenza A By PCR: NEGATIVE
Influenza B By PCR: NEGATIVE

## 2017-10-16 LAB — GROUP A STREP BY PCR: Group A Strep by PCR: NOT DETECTED

## 2017-10-16 MED ORDER — BENZONATATE 100 MG PO CAPS: ORAL_CAPSULE | ORAL | 0 refills | Status: DC

## 2017-10-16 MED ORDER — LISINOPRIL 40 MG PO TABS: 40.0000 mg | ORAL_TABLET | Freq: Every day | ORAL | 1 refills | Status: DC

## 2017-10-16 MED ORDER — ACETAMINOPHEN 500 MG PO TABS
1000.0000 mg | ORAL_TABLET | Freq: Once | ORAL | Status: AC
Start: 2017-10-16 — End: 2017-10-16
  Administered 2017-10-16: 1000 mg via ORAL
  Filled 2017-10-16: qty 2

## 2017-10-16 MED ORDER — LISINOPRIL 10 MG PO TABS
40.0000 mg | ORAL_TABLET | Freq: Once | ORAL | Status: AC
  Administered 2017-10-16: 40 mg via ORAL
  Filled 2017-10-16: qty 4

## 2017-10-16 MED ORDER — AZITHROMYCIN 250 MG PO TABS: ORAL_TABLET | ORAL | 0 refills | Status: DC

## 2017-10-16 MED ORDER — FLUTICASONE PROPIONATE 50 MCG/ACT NA SUSP: 2.0000 | Freq: Every day | NASAL | 0 refills | Status: DC

## 2017-10-16 MED ORDER — IPRATROPIUM-ALBUTEROL 0.5-2.5 (3) MG/3ML IN SOLN
3.0000 mL | Freq: Once | RESPIRATORY_TRACT | Status: AC
  Administered 2017-10-16: 3 mL via RESPIRATORY_TRACT
  Filled 2017-10-16: qty 3

## 2017-10-16 NOTE — ED Provider Notes (Signed)
[facility] Emergency Department Provider Note ____________________________________________  Time seen: 1801  I have reviewed the triage vital signs and the nursing notes.  HISTORY  Chief Complaint  Fever and Generalized Body Aches  HPI Leah Bright is a 27 y.o. female resents to the ED accompanied by her husband for evaluation of an onset of fevers, headache, and body aches for the last day.  Patient did not receive the seasonal flu vaccine.  She presents now with complaints of chest tightness and shortness of breath.  She denies any nausea, vomiting, dizziness.  Denies any sick contacts, or recent travel.  Past Medical History:  Diagnosis Date  . Anxiety   . Asthma   . Depression   . Hypertension   . Renal artery stenosis (HCC)   . Thyroid disease     There are no active problems to display for this patient.   Past Surgical History:  Procedure Laterality Date  . CHOLECYSTECTOMY    . LAPAROSCOPIC GASTRIC BANDING    . LAPAROSCOPIC REPAIR AND REMOVAL OF GASTRIC BAND    . TONSILLECTOMY      Prior to Admission medications   Medication Sig Start Date End Date Taking? Authorizing Provider  albuterol (PROVENTIL HFA;VENTOLIN HFA) 108 (90 Base) MCG/ACT inhaler Inhale 2 puffs into the lungs every 6 (six) hours as needed for wheezing or shortness of breath. 07/20/16   Tommi Rumps, PA-C  azithromycin (ZITHROMAX Z-PAK) 250 MG tablet Take 2 tablets (500 mg) on  Day 1,  followed by 1 tablet (250 mg) once daily on Days 2 through 5. 10/16/17   [place] Foerster, Charlesetta Ivory, PA-C  benzonatate (TESSALON PERLES) 100 MG capsule Take 1-2 tabs TID prn cough 10/16/17   Zari Cly, Charlesetta Ivory, PA-C  butalbital-acetaminophen-caffeine (FIORICET, ESGIC) 50-325-40 MG tablet Take 1 tablet every 6 (six) hours as needed by mouth for headache. 07/09/17 07/09/18  Tommi Rumps, PA-C  fluticasone (FLONASE) 50 MCG/ACT nasal spray Place 2 sprays into both nostrils daily. 10/16/17    Irvin Lizama, Charlesetta Ivory, PA-C  HYDROcodone-acetaminophen (NORCO/VICODIN) 5-325 MG tablet Take 1 tablet by mouth every 4 (four) hours as needed. 07/26/17   Minna Antis, MD  levothyroxine (SYNTHROID, LEVOTHROID) 100 MCG tablet Take 100 mcg by mouth daily before breakfast.    [provider]  lisinopril (PRINIVIL,ZESTRIL) 40 MG tablet Take 40 mg by mouth daily.    [provider]  lisinopril (PRINIVIL,ZESTRIL) 40 MG tablet Take 1 tablet (40 mg total) by mouth daily. 10/16/17 12/15/17  Nicollette Wilhelmi, Charlesetta Ivory, PA-C  LORazepam (ATIVAN) 0.5 MG tablet Take 0.5 mg by mouth every 8 (eight) hours as needed for anxiety.    [provider]  traZODone (DESYREL) 100 MG tablet Take 1 tablet (100 mg total) by mouth at bedtime. 04/15/17 04/29/17  Willy Eddy, MD    Allergies Codeine; Pepto-bismol [bismuth]; and Toradol [ketorolac tromethamine]  No family history on file.  Social History Social History   Tobacco Use  . Smoking status: Never Smoker  . Smokeless tobacco: Never Used  Substance Use Topics  . Alcohol use: Yes    Comment: rarely  . Drug use: No    Review of Systems  Constitutional: Positive for fever. Eyes: Negative for visual changes. ENT: Positive for sore throat. Cardiovascular: Negative for chest pain. Respiratory: Positive for shortness of breath. Gastrointestinal: Negative for abdominal pain, vomiting and diarrhea. Genitourinary: Negative for dysuria. Musculoskeletal: Negative for back pain. Skin: Negative for rash. Neurological: Negative for focal weakness  or numbness. Reports headache.  ____________________________________________  PHYSICAL EXAM:  VITAL SIGNS: ED Triage Vitals  Enc Vitals Group     BP 10/16/17 1737 (!) 168/102     Pulse Rate 10/16/17 1737 100     Resp 10/16/17 1737 20     Temp 10/16/17 1737 (!) 102 F (38.9 C)     Temp Source 10/16/17 1737 Oral     SpO2 10/16/17 1737 99 %     Weight 10/16/17 1732 286 lb (129.7  kg)     Height 10/16/17 1732 5\' 4"  (1.626 m)     Head Circumference --      Peak Flow --      Pain Score 10/16/17 1732 5     Pain Loc --      Pain Edu? --      Excl. in GC? --     Constitutional: Alert and oriented. Well appearing and in no distress. Head: Normocephalic and atraumatic. Eyes: Conjunctivae are normal. PERRL. Normal extraocular movements Ears: Canals clear. TMs intact bilaterally. Nose: No congestion/rhinorrhea/epistaxis. Mouth/Throat: Mucous membranes are moist. Neck: Supple. No thyromegaly. Hematological/Lymphatic/Immunological: No cervical lymphadenopathy. Cardiovascular: Normal rate, regular rhythm. Normal distal pulses. Respiratory: Normal respiratory effort. No wheezes/rales/rhonchi. Gastrointestinal: Soft and nontender. No distention. Musculoskeletal: Nontender with normal range of motion in all extremities.  Neurologic:  Normal gait without ataxia. Normal speech and language. No gross focal neurologic deficits are appreciated. Skin:  Skin is warm, dry and intact. No rash noted. Psychiatric: Mood and affect are normal. Patient exhibits appropriate insight and judgment. ____________________________________________   LABS (pertinent positives/negatives)  Labs Reviewed  GROUP A STREP BY PCR  INFLUENZA PANEL BY PCR (TYPE A & B)  ____________________________________________   RADIOLOGY  CXR  Negative ____________________________________________  PROCEDURES  Procedures DuoNeb x 1 Lisinopril 40 mg PO ____________________________________________  INITIAL IMPRESSION / ASSESSMENT AND PLAN / ED COURSE  She with ED evaluation of sudden onset of fevers, body aches, fatigue, and headache.  Patient's exam is consistent with a likely viral etiology.  Despite her negative screens for influenza and group A strep, and her negative chest x-ray.  She likely has a viral etiology consistent with influenza.  She is discharged at this time [facility]  Perles, Flonase, and azithromycin.  Is also given a courtesy refill of her blood pressure medicine she is advised to follow-up with a local community clinics or [facility], for assistance with prescriptions.  Return precautions have been reviewed. ____________________________________________  FINAL CLINICAL IMPRESSION(S) / ED DIAGNOSES  Final diagnoses:  Viral upper respiratory tract infection      Maruice Pieroni, Charlesetta IvoryJenise V Bacon, PA-C 10/16/17 2055    Jeanmarie PlantMcShane, James A, MD 10/16/17 [phone]

## 2017-10-16 NOTE — Discharge Instructions (Addendum)
You labs and chest x-ray do not show infection with influenza, strep, or a pneumonia. You have an upper respiratory infection, and are being treated with antibiotics, empirically. This means that your symptoms are suspicious for a bacterial infection. Take the antibiotic as directed. Rest, hydrate, and follow-up with your provider. Return to the ED for worsening symptoms.

## 2017-10-16 NOTE — ED Triage Notes (Signed)
Presents with a 1 day hx of fever and body aches and sore throat

## 2017-12-25 IMAGING — CT CT CHEST W/ CM
2 of 3 series · 15 of 36 positions shown, 18 images · IV contrast (iopamidol)
Comparison: Chest x-ray 09/20/2016

CLINICAL DATA: Bronchitis, continuous cough

EXAM:
CT CHEST WITH CONTRAST
TECHNIQUE: Multidetector CT imaging of the chest was performed during
intravenous contrast administration.
CONTRAST:  75mL [id number] IOPAMIDOL ([id number]) INJECTION 61%

[Series 2: axial st · axial · 0.75mm/px · z∈[-641,-401]mm · 12 of 142 slices shown, 15 images]
[im 11/142  mediastinal]
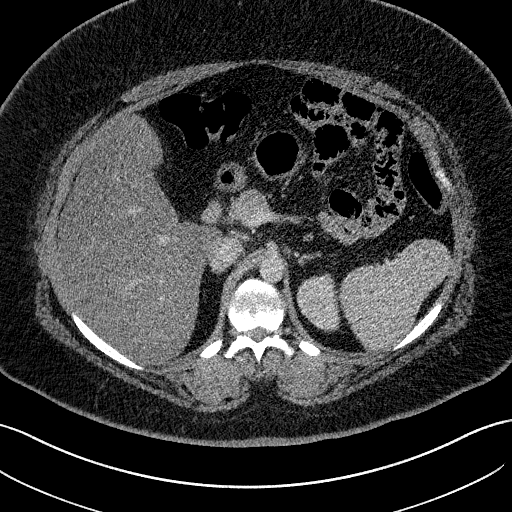
[im 11/142  lung]
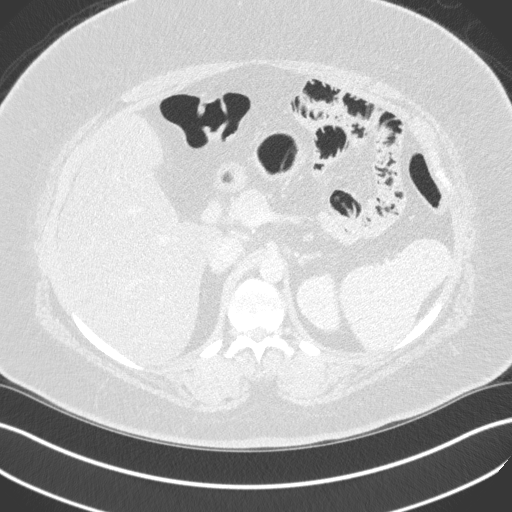
[im 21/142  lung]
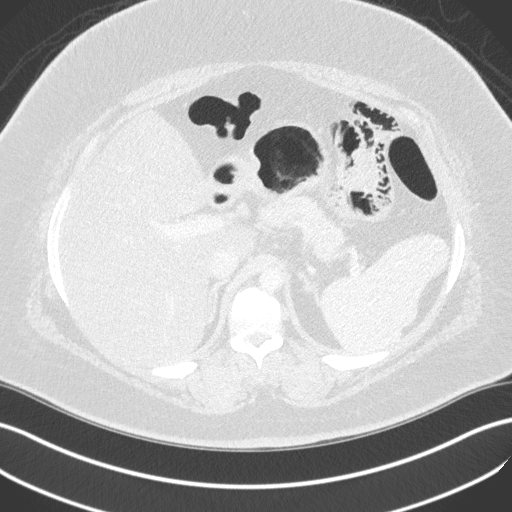
[im 32/142  lung]
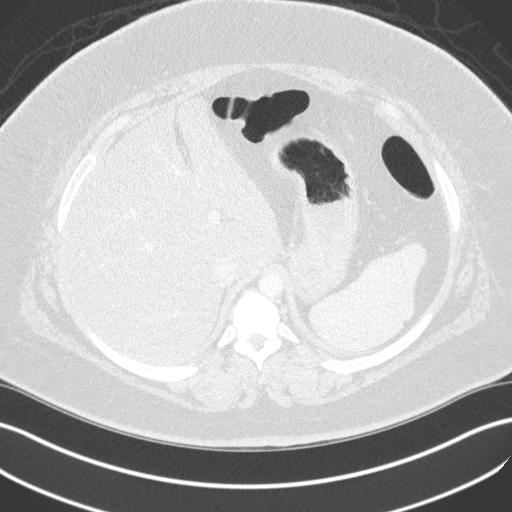
[im 42/142  lung]
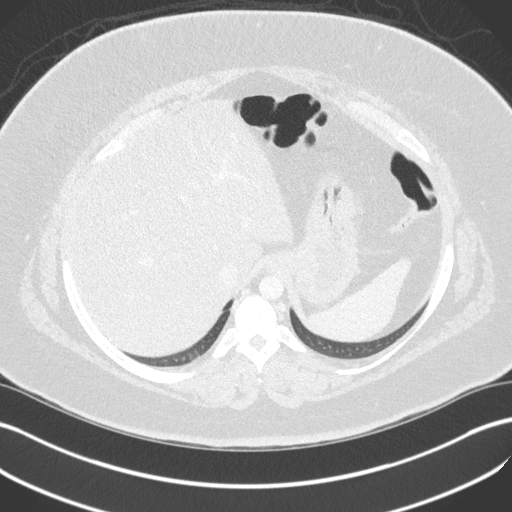
[im 53/142  mediastinal]
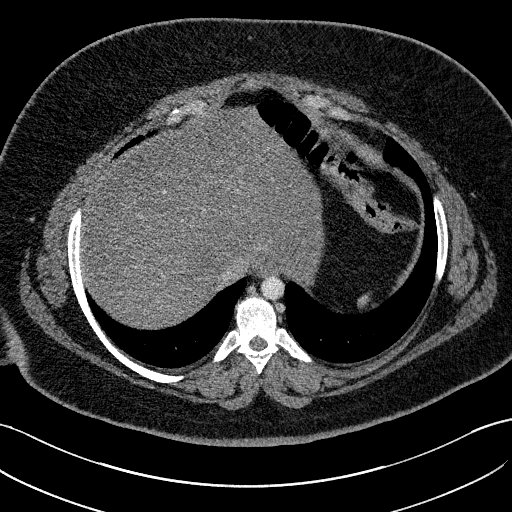
[im 53/142  lung]
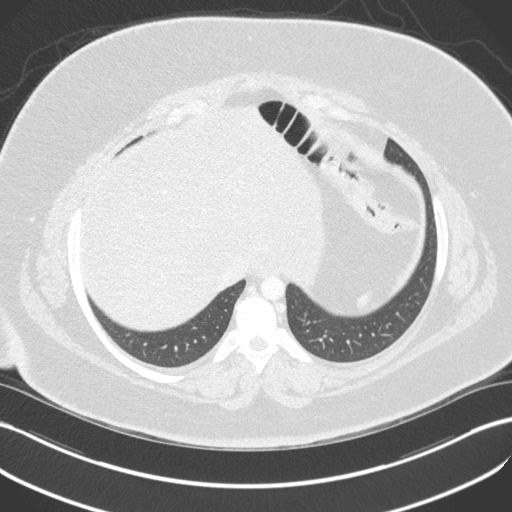
[im 63/142  lung]
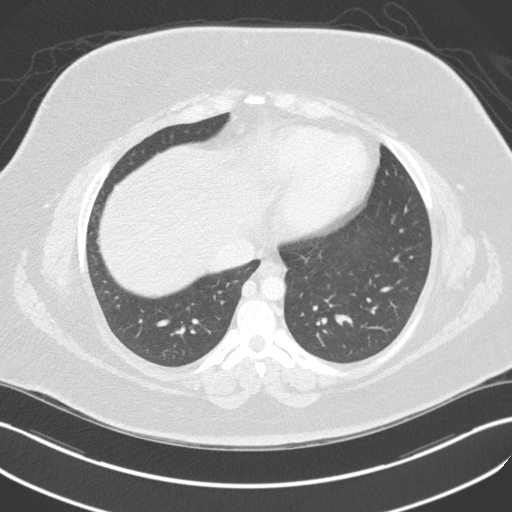
[im 79/142  lung]
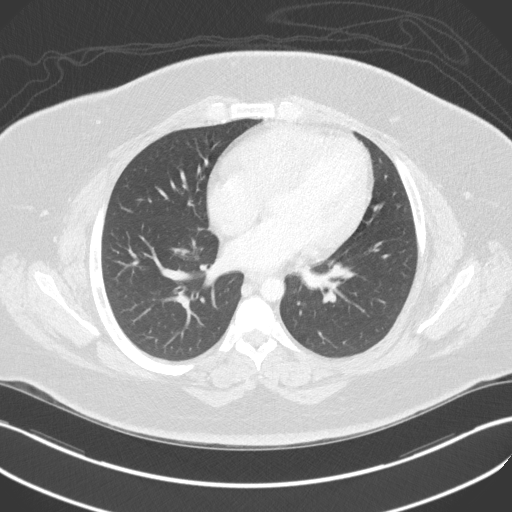
[im 89/142  lung]
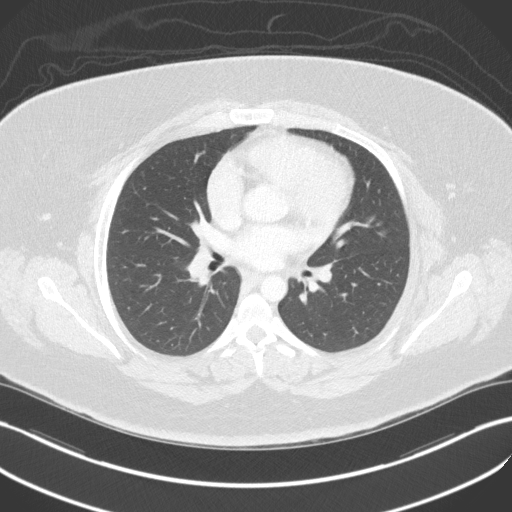
[im 100/142  mediastinal]
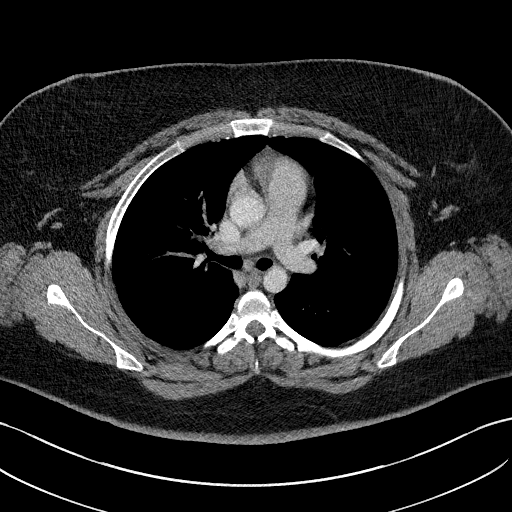
[im 100/142  lung]
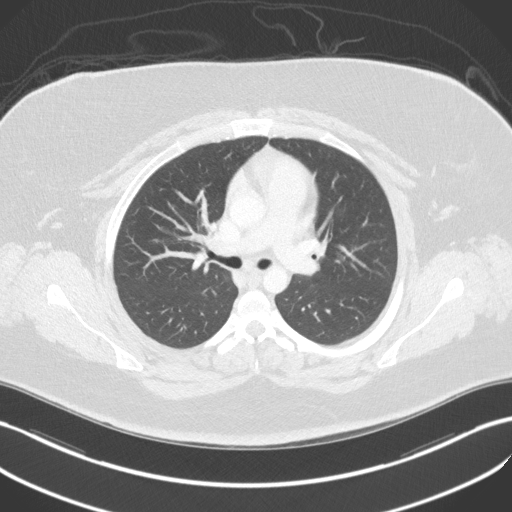
[im 110/142  lung]
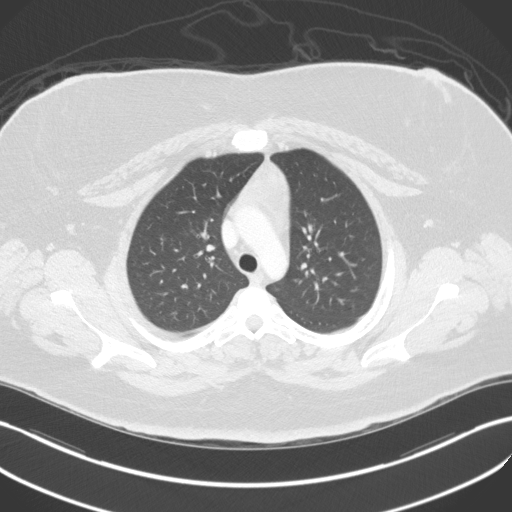
[im 121/142  lung]
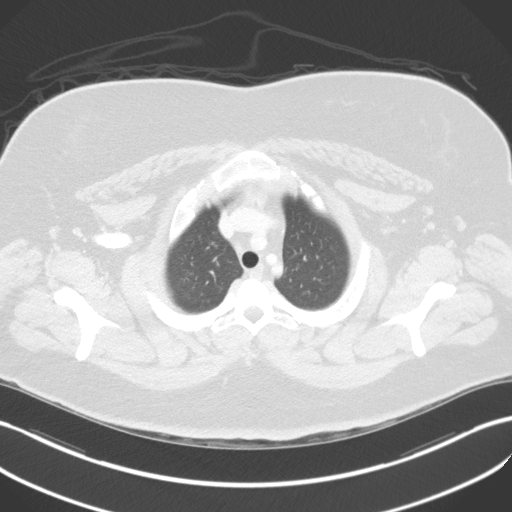
[im 131/142  lung]
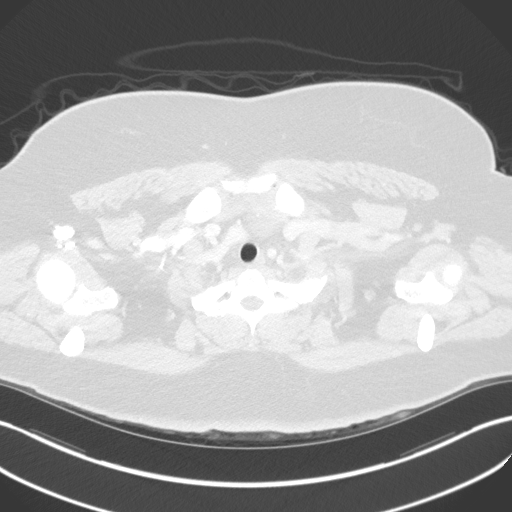

[Series 5: coronal · coronal · 0.57mm/px · 3 of 171 slices shown]
[im 35/171  lung]
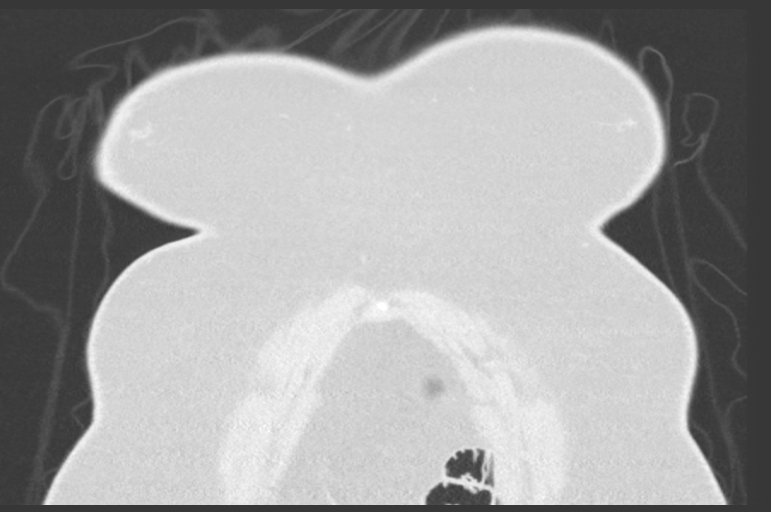
[im 69/171  lung]
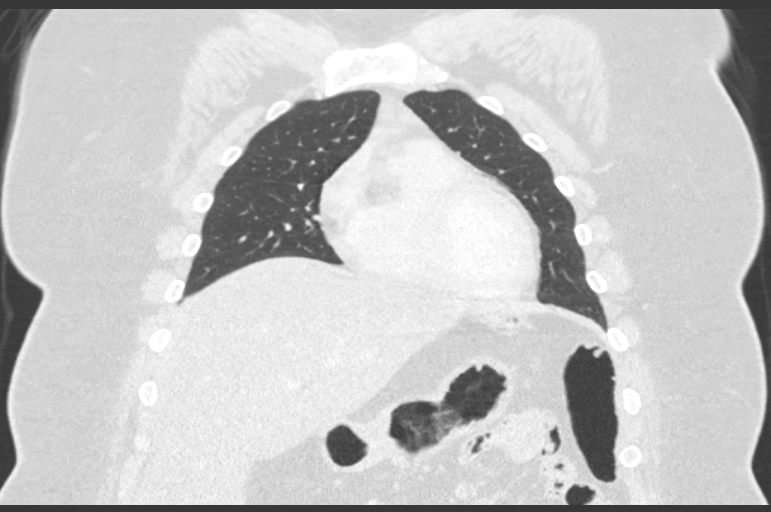
[im 103/171  lung]
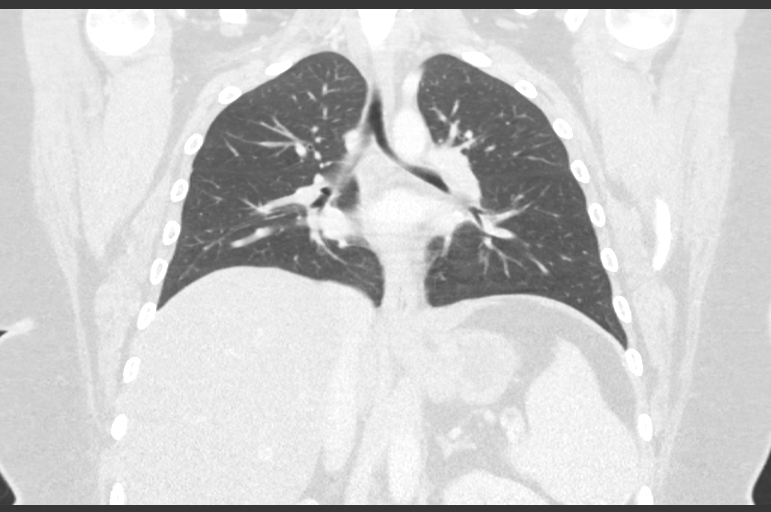

[15 of 36 positions shown; findings below may reference images not displayed]

FINDINGS: Cardiovascular: Non aneurysmal aorta. No dissection. Normal heart
size. No pericardial effusion.

Mediastinum/Nodes: Soft tissue density in the anterior mediastinum
fell consistent with residual thymic tissue. No masslike features.
No significant mediastinal or hilar adenopathy. Trachea is midline.
Esophagus within normal limits.

Lungs/Pleura: Lungs are clear. No pleural effusion or pneumothorax.

Upper Abdomen: Fatty infiltration of the liver. Surgical clips in
the gallbladder fossa. No acute abnormalities.

Musculoskeletal: No chest wall abnormality. No acute or significant
osseous findings.
IMPRESSION: 1. Lung fields are clear.  No acute infiltrate or effusion.
2. Hepatic steatosis

## 2018-03-02 ENCOUNTER — Encounter: Payer: Self-pay | Admitting: Emergency Medicine

## 2018-03-02 ENCOUNTER — Other Ambulatory Visit: Payer: Self-pay

## 2018-03-02 ENCOUNTER — Emergency Department
Admission: EM | Admit: 2018-03-02 | Discharge: 2018-03-02 | Disposition: A | Discharge status: Home / Self Care | Payer: Self-pay | Attending: Emergency Medicine | Admitting: Emergency Medicine

## 2018-03-02 DIAGNOSIS — F331 Major depressive disorder, recurrent, moderate: Secondary | ICD-10-CM

## 2018-03-02 DIAGNOSIS — F329 Major depressive disorder, single episode, unspecified: Secondary | ICD-10-CM | POA: Insufficient documentation

## 2018-03-02 DIAGNOSIS — Z9884 Bariatric surgery status: Secondary | ICD-10-CM | POA: Insufficient documentation

## 2018-03-02 DIAGNOSIS — I10 Essential (primary) hypertension: Secondary | ICD-10-CM | POA: Insufficient documentation

## 2018-03-02 DIAGNOSIS — F4010 Social phobia, unspecified: Secondary | ICD-10-CM

## 2018-03-02 DIAGNOSIS — Z79899 Other long term (current) drug therapy: Secondary | ICD-10-CM | POA: Insufficient documentation

## 2018-03-02 DIAGNOSIS — I701 Atherosclerosis of renal artery: Secondary | ICD-10-CM

## 2018-03-02 DIAGNOSIS — J45909 Unspecified asthma, uncomplicated: Secondary | ICD-10-CM | POA: Insufficient documentation

## 2018-03-02 HISTORY — DX: Social phobia, unspecified: F40.10

## 2018-03-02 LAB — URINE DRUG SCREEN, QUALITATIVE (ARMC ONLY)
AMPHETAMINES, UR SCREEN: NOT DETECTED
BENZODIAZEPINE, UR SCRN: NOT DETECTED
Cannabinoid 50 Ng, Ur ~~LOC~~: NOT DETECTED
Cocaine Metabolite,Ur ~~LOC~~: NOT DETECTED
MDMA (Ecstasy)Ur Screen: NOT DETECTED
METHADONE SCREEN, URINE: NOT DETECTED
OPIATE, UR SCREEN: NOT DETECTED
Phencyclidine (PCP) Ur S: NOT DETECTED
TRICYCLIC, UR SCREEN: NOT DETECTED

## 2018-03-02 LAB — CBC
HEMATOCRIT: 40 % (ref 35.0–47.0)
Hemoglobin: 14 g/dL (ref 12.0–16.0)
MCH: 27.6 pg (ref 26.0–34.0)
MCHC: 35 g/dL (ref 32.0–36.0)
MCV: 78.9 fL — ABNORMAL LOW (ref 80.0–100.0)
PLATELETS: 284 10*3/uL (ref 150–440)
RBC: 5.07 MIL/uL (ref 3.80–5.20)
RDW: 13.8 % (ref 11.5–14.5)
WBC: 8.6 10*3/uL (ref 3.6–11.0)

## 2018-03-02 LAB — COMPREHENSIVE METABOLIC PANEL
ALBUMIN: 4.1 g/dL (ref 3.5–5.0)
ALT: 55 U/L — ABNORMAL HIGH (ref 0–44)
ANION GAP: 10 (ref 5–15)
AST: 44 U/L — ABNORMAL HIGH (ref 15–41)
Alkaline Phosphatase: 93 U/L (ref 38–126)
BILIRUBIN TOTAL: 0.8 mg/dL (ref 0.3–1.2)
BUN: 15 mg/dL (ref 6–20)
CO2: 25 mmol/L (ref 22–32)
Calcium: 9.7 mg/dL (ref 8.9–10.3)
Chloride: 108 mmol/L (ref 98–111)
Creatinine, Ser: 0.72 mg/dL (ref 0.44–1.00)
GFR calc Af Amer: >60 mL/min (ref >60)
GFR calc non Af Amer: >60 mL/min (ref >60)
GLUCOSE: 151 mg/dL — AB (ref 70–99)
POTASSIUM: 4 mmol/L (ref 3.5–5.1)
SODIUM: 143 mmol/L (ref 135–145)
TOTAL PROTEIN: 7.4 g/dL (ref 6.5–8.1)

## 2018-03-02 LAB — ETHANOL: Alcohol, Ethyl (B): <10 mg/dL (ref <10)

## 2018-03-02 LAB — POC URINE PREG, ED: PREG TEST UR: NEGATIVE

## 2018-03-02 LAB — SALICYLATE LEVEL: Salicylate Lvl: <7 mg/dL (ref 2.8–30.0)

## 2018-03-02 LAB — ACETAMINOPHEN LEVEL

## 2018-03-02 LAB — LIPASE, BLOOD: LIPASE: 31 U/L (ref 11–51)

## 2018-03-02 MED ORDER — BUSPIRONE HCL 15 MG PO TABS: 7.5000 mg | ORAL_TABLET | Freq: Two times a day (BID) | ORAL | 1 refills | Status: DC

## 2018-03-02 MED ORDER — VENLAFAXINE HCL ER 150 MG PO CP24: 150.0000 mg | ORAL_CAPSULE | Freq: Every day | ORAL | 1 refills | Status: DC

## 2018-03-02 NOTE — Discharge Instructions (Signed)
You can switch to the Effexor and BuSpar that Dr. Toni Amendlapacs has prescribed for you.  If you do this then stop the Prozac

## 2018-03-02 NOTE — ED Notes (Signed)
1733 discharge note was erroneously documented under name of Amy B RN by this Clinical research associatewriter, who wrote it.

## 2018-03-02 NOTE — ED Triage Notes (Signed)
Pt presents to ED with c/o hx of social anxiety. Pt states was taking Buspar and Effexor and Ativan, pt states recently went to [facility] to get her medicines but "all they do is prescribe prozac and group therapy". Pt states that her husband told her if she didn't get seen he would have her sent away. Pt states she has been off of her Buspar and Effexor for 2 years. Pt denies active SI/HI at this time. Pt noted to be tearful in triage at this time.

## 2018-03-02 NOTE — ED Notes (Signed)
1 silver book bag, 1 cell phone,

## 2018-03-02 NOTE — BH Assessment (Signed)
Assessment Note  Leah Bright is an 27 y.o. female. Patient presents to [facility] voluntarily due to depression and anxiety. Patient states she needs to get her medicine re-prescribed because the medication prescribed by her psychiatrist are not effective. Patient states she has been diagnosed with PTSD, Major Depressive Disorder, OCD, and Generalized Anxiety Disorder. Patient states her mental health symptoms have gotten increasingly worse over the past 6 months. Patient reports her husband is unsupportive and told her she needs to come to [facility] or she will have to leave the home and go back to [place]. Patient endorses lying in bed all day and feelings of hopelessness. Furthermore, patient reports she experiences panic attacks which are triggered by large crowds. Patient denies SI, HI, and AVH.  Patient denies illicit drug and alcohol use.  Patient doesn't currently have any involvement in the legal system.  Patient reports she attends group therapy and receives medication management at [facility] in [place], [place]. Patient endorses 2 previous inpatient psychiatric hospitalizations at two hospitals in [place] for depression and anxiety.   Patient presented oriented x 4, with a tearful, depressed affect.  Diagnosis: Major Depressive Disorder  Past Medical History:  Past Medical History:  Diagnosis Date  . Anxiety   . Asthma   . Depression   . Hypertension   . Renal artery stenosis (HCC)   . Social anxiety disorder   . Thyroid disease     Past Surgical History:  Procedure Laterality Date  . CHOLECYSTECTOMY    . LAPAROSCOPIC GASTRIC BANDING    . LAPAROSCOPIC REPAIR AND REMOVAL OF GASTRIC BAND    . TONSILLECTOMY      Family History: History reviewed. No pertinent family history.  Social History:  reports that she has never smoked. She has never used smokeless tobacco. She reports that she drinks alcohol. She reports that she does not use drugs.  Additional Social History:  Alcohol /  Drug Use Pain Medications: SEE PTA  Prescriptions: SEE PTA  Over the Counter: SEE PTA  History of alcohol / drug use?: No history of alcohol / drug abuse Longest period of sobriety (when/how long): None reported   CIWA: CIWA-Ar Pulse Rate: 81 COWS:    Allergies:  Allergies  Allergen Reactions  . Codeine   . Pepto-Bismol [Bismuth]   . Toradol [Ketorolac Tromethamine]     Home Medications:  (Not in a hospital admission)  OB/GYN Status:  Patient's last menstrual period was 12/31/2017 (within days).  General Assessment Data Assessment unable to be completed: (Assessment completed) Location of Assessment[facility] TTS Assessment: In system Is this a Tele or Face-to-Face Assessment?: Face-to-Face Is this an Initial Assessment or a Re-assessment for this encounter?: Initial Assessment Marital status: Married ArnoldMaiden name: Vess Is patient pregnant?: No Pregnancy Status: No Living Arrangements: Spouse/significant other, Children Can pt return to current living arrangement?: Yes Admission Status: Voluntary Is patient capable of signing voluntary admission?: Yes Referral Source: Self/Family/Friend Insurance type: No Engineer, civil (consulting)insurance  Medical Screening Exam [facility]-in ONLY) Medical Exam completed: Yes  Crisis Care Plan Living Arrangements: Spouse/significant other, Children Legal Guardian: Other:(None reported) Name of Psychiatrist: [facility]  Name of Therapist: [facility]   Education Status Is patient currently in school?: No Is the patient employed, unemployed or receiving disability?: Unemployed  Risk to self with the past 6 months Suicidal Ideation: No Has patient been a risk to self within the past 6 months prior to admission? : No Suicidal Intent: No Has patient had any suicidal intent within the past  6 months prior to admission? : No Is patient at risk for suicide?: No Suicidal Plan?: No Has patient had any suicidal plan within the past 6 months prior to admission? : No Access to  Means: No What has been your use of drugs/alcohol within the last 12 months?: None reported Previous Attempts/Gestures: No How many times?: 0 Other Self Harm Risks: None reported Triggers for Past Attempts: Other (Comment)(None reported) Intentional Self Injurious Behavior: None Family Suicide History: No Recent stressful life event(s): Conflict (Comment) Persecutory voices/beliefs?: No Depression: Yes Depression Symptoms: Isolating, Tearfulness, Feeling worthless/self pity, Loss of interest in usual pleasures Substance abuse history and/or treatment for substance abuse?: No Suicide prevention information given to non-admitted patients: Not applicable  Risk to Others within the past 6 months Homicidal Ideation: No Does patient have any lifetime risk of violence toward others beyond the six months prior to admission? : No Thoughts of Harm to Others: No Current Homicidal Intent: No Current Homicidal Plan: No Access to Homicidal Means: No Identified Victim: None reported History of harm to others?: No Assessment of Violence: None Noted Violent Behavior Description: None reported Does patient have access to weapons?: No Criminal Charges Pending?: No Does patient have a court date: No Is patient on probation?: No  Psychosis Hallucinations: None noted Delusions: None noted  Mental Status Report Appearance/Hygiene: In scrubs Eye Contact: Fair Motor Activity: Unremarkable Speech: Unremarkable Level of Consciousness: Alert Mood: Depressed Affect: Depressed Anxiety Level: None Thought Processes: Coherent Judgement: Impaired Orientation: Person, Place, Time, Situation, Appropriate for developmental age Obsessive Compulsive Thoughts/Behaviors: None  Cognitive Functioning Concentration: Normal Memory: Recent Intact, Remote Intact Is patient IDD: No Is patient DD?: No Insight: Fair Impulse Control: Fair Appetite: Fair Have you had any weight changes? : No Change Sleep: No  Change Total Hours of Sleep: 5 Vegetative Symptoms: Staying in bed  ADLScreening [facility] Assessment Services) Patient's cognitive ability adequate to safely complete daily activities?: Yes Patient able to express need for assistance with ADLs?: Yes Independently performs ADLs?: Yes (appropriate for developmental age)  Prior Inpatient Therapy Prior Inpatient Therapy: Yes Prior Therapy Dates: unknown Prior Therapy Facilty/Provider(s): [facility] in [place] (name unknown) Reason for Treatment: Depression, Anxiety  Prior Outpatient Therapy Prior Outpatient Therapy: Yes Prior Therapy Dates: current Prior Therapy Facilty/Provider(s): [facility]  Reason for Treatment: MDD, GAD, OCD, PTSD  Does patient have an ACCT team?: No Does patient have Intensive In-House Services?  : No Does patient have [facility] services? : No Does patient have P4CC services?: No  ADL Screening (condition at time of admission) Patient's cognitive ability adequate to safely complete daily activities?: Yes Is the patient deaf or have difficulty hearing?: No Does the patient have difficulty seeing, even when wearing glasses/contacts?: No Does the patient have difficulty concentrating, remembering, or making decisions?: No Patient able to express need for assistance with ADLs?: Yes Does the patient have difficulty dressing or bathing?: No Independently performs ADLs?: Yes (appropriate for developmental age) Does the patient have difficulty walking or climbing stairs?: No Weakness of Legs: None Weakness of Arms/Hands: None  Home Assistive Devices/Equipment Home Assistive Devices/Equipment: None  Therapy Consults (therapy consults require a physician order) PT Evaluation Needed: No OT Evalulation Needed: No SLP Evaluation Needed: No Abuse/Neglect Assessment (Assessment to be complete while patient is alone) Abuse/Neglect Assessment Can Be Completed: Yes Physical Abuse: Denies Verbal Abuse: Denies Sexual Abuse:  Denies Exploitation of patient/patient's resources: Denies Self-Neglect: Denies Values / Beliefs Cultural Requests During Hospitalization: None Spiritual Requests During Hospitalization: None Consults Spiritual Care  Consult Needed: No Social Work Consult Needed: No Merchant navy officer (For Healthcare) Does Patient Have a Programmer, multimedia?: No Would patient like information on creating a medical advance directive?: No - Patient declined          Disposition:  Disposition Initial Assessment Completed for this Encounter: Yes Patient referred to: Other (Comment)(pending psych consult )  On Site Evaluation by:   Reviewed with Physician:    Galen Manila, LPC, LCAS-A 03/02/2018 4:34 PM

## 2018-03-02 NOTE — ED Notes (Signed)
Per MD order, pt was discharged to lobby after receiving belongings, scripts for Buspar and Effexor, and discharge instructions. She verbalized readiness for discharge, said she had received all belongings (including home meds) and was in no acute distress.

## 2018-03-02 NOTE — ED Notes (Signed)
Pt denies SI, HI, and AVH. She endorses severe untreated anxiety that she says was previously successfully treated by Buspar and Effexor. She says she's uninsured and [facility] only wants to give her Prozac and group therapy, which social anxiety prohibits her from doing. She says her husband is growing tired of her issues. She is tearful and cooperative. Ginger Ale and crackers given to soothe her stomach. She denies having any thoughts of SI since she was at least 19. MD spoke with her, and TTS is speaking with her now. Pt was advised of need for urine specimen. Fifteen-minute checks in place.

## 2018-03-02 NOTE — ED Notes (Signed)
Pt's home medications given to pharmacy tech.

## 2018-03-02 NOTE — ED Notes (Addendum)
Pt dressed out into appropriate behavioral health clothing with this tech and Amy B.,RN in the rm. Pt belongings consist of a silver back pack, a cell phone, gray tennis shoes, gray socks, a pair of blue jeans, a black shirt, light pink panties, a white bra a black hair bow, two sets of keys, a purple flashlight, a yellow high lighter, a pair of sunglasses, a cube, a green cell phone charger, a fidget spinner, a coin purse with a drivers license, a twenty dollar bill, a five dollar bill three quarters, two nickels, two dimes and six pennies, and a silver pen. Pt calm and cooperative while dressing out.

## 2018-03-02 NOTE — ED Notes (Addendum)
Pt has flonase, a bottle of trazodone and a bottle of prozac that was given to Amy B.RN. Pt tearful in triage. Pt ambulated back to rm 20 by Amy B.,RN.

## 2018-03-02 NOTE — Consult Note (Signed)
[facility] Consult   Reason for Consult: Consult for this 27 year old woman with a reported history of depression and anxiety who comes to the emergency room for her psychiatric symptoms Referring Physician: Rip Harbour Patient Identification: Leah Bright MRN:  [id number] Principal Diagnosis: Renal artery stenosis [facility]) Diagnosis:   Patient Active Problem List   Diagnosis Date Noted  . 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 Renal artery stenosis ([place]) [I70.1] 03/02/2018    Total Time spent with patient: 1 hour  Subjective:   Leah Bright is a 27 y.o. female patient admitted with "I need my medicine".  HPI: Patient interviewed chart reviewed.  27 year old woman came to the emergency room voluntarily stating that her symptoms of anxiety and depression are intolerable.  Leah Bright has been suffering with depression for years but the current bout has been getting worse for several months now.  Mood feels down and anxious all the time.  Sleep is erratic.  Energy level is poor.  Patient denies however any suicidal thoughts at all.  Denies any psychotic symptoms.  Leah Bright is more troubled by her symptoms of social anxiety.  Gets overwhelmed and panicky at any thought of leaving the house.  Symptoms are causing disruption in her marriage.  Patient is currently going to [facility] and has been prescribed Prozac 40 mg a day but feels it is not effective.  Leah Bright is asking to be switched back to Effexor and BuSpar.  Social history: Patient and her husband and HER-2 stepchildren are living here in [place].  Just recently moved here a few months ago.  Patient does not work outside the home.  Leah Bright indicates that her husband has been unsupportive around her psychiatric symptoms.  Medical history: High blood pressure, overweight, hypothyroid, migraine headaches  Substance abuse history: Denies that Leah Bright uses any illegal drugs.  Says her use of alcohol is very infrequent.  Denies  misuse of prescription drugs.    Past Psychiatric History: Patient has had 2 previous psychiatric hospitalizations in [place].  Records not available.  Leah Bright says those were because Leah Bright had been cutting at the time.  Has not been doing any cutting or self injury recently.  Has never tried to kill herself in the past.  Patient indicates that a combination of Effexor, buspirone and Ativan was very helpful for her when Leah Bright was living in new [facility].  No history of mania or psychosis  Risk to Self: Suicidal Ideation: No Suicidal Intent: No Is patient at risk for suicide?: No Suicidal Plan?: No Access to Means: No What has been your use of drugs/alcohol within the last 12 months?: None reported How many times?: 0 Other Self Harm Risks: None reported Triggers for Past Attempts: Other (Comment)(None reported) Intentional Self Injurious Behavior: None Risk to Others: Homicidal Ideation: No Thoughts of Harm to Others: No Current Homicidal Intent: No Current Homicidal Plan: No Access to Homicidal Means: No Identified Victim: None reported History of harm to others?: No Assessment of Violence: None Noted Violent Behavior Description: None reported Does patient have access to weapons?: No Criminal Charges Pending?: No Does patient have a court date: No Prior Inpatient Therapy: Prior Inpatient Therapy: Yes Prior Therapy Dates: unknown Prior Therapy Facilty/Provider(s): [facility] in [place] (name unknown) Reason for Treatment: Depression, Anxiety Prior Outpatient Therapy: Prior Outpatient Therapy: Yes Prior Therapy Dates: current Prior Therapy Facilty/Provider(s): [facility]  Reason for Treatment: MDD, GAD, OCD, PTSD  Does patient have an ACCT team?: No Does patient have Intensive In-House  Services?  : No Does patient have [facility] services? : No Does patient have P4CC services?: No  Past Medical History:  Past Medical History:  Diagnosis Date  . Anxiety   . Asthma   . Depression   .  Hypertension   . Renal artery stenosis ([place])   . Social anxiety disorder   . Thyroid disease     Past Surgical History:  Procedure Laterality Date  . CHOLECYSTECTOMY    . LAPAROSCOPIC GASTRIC BANDING    . LAPAROSCOPIC REPAIR AND REMOVAL OF GASTRIC BAND    . TONSILLECTOMY     Family History: History reviewed. No pertinent family history. Family Psychiatric  History: Positive for depression and anxiety in several first-degree relatives Social History:  Social History   Substance and Sexual Activity  Alcohol Use Yes   Comment: rarely     Social History   Substance and Sexual Activity  Drug Use No    Social History   Socioeconomic History  . Marital status: Married    Spouse name: Not on file  . Number of children: Not on file  . Years of education: Not on file  . Highest education level: Not on file  Occupational History  . Not on file  Social Needs  . Financial resource strain: Not on file  . Food insecurity:    Worry: Not on file    Inability: Not on file  . Transportation needs:    Medical: Not on file    Non-medical: Not on file  Tobacco Use  . Smoking status: Never Smoker  . Smokeless tobacco: Never Used  Substance and Sexual Activity  . Alcohol use: Yes    Comment: rarely  . Drug use: No  . Sexual activity: Not on file  Lifestyle  . Physical activity:    Days per week: Not on file    Minutes per session: Not on file  . Stress: Not on file  Relationships  . Social connections:    Talks on phone: Not on file    Gets together: Not on file    Attends religious service: Not on file    Active member of club or organization: Not on file    Attends meetings of clubs or organizations: Not on file    Relationship status: Not on file  Other Topics Concern  . Not on file  Social History Narrative  . Not on file   Additional Social History:    Allergies:   Allergies  Allergen Reactions  . Codeine   . Pepto-Bismol [Bismuth]   . Toradol [Ketorolac  Tromethamine]     Labs:  Results for orders placed or performed during the hospital encounter of 03/02/18 (from the past 48 hour(s))  Comprehensive metabolic panel     Status: Abnormal   Collection Time: 03/02/18  3:32 PM  Result Value Ref Range   Sodium 143 135 - 145 mmol/L   Potassium 4.0 3.5 - 5.1 mmol/L   Chloride 108 98 - 111 mmol/L    Comment: Please note change in reference range.   CO2 25 22 - 32 mmol/L   Glucose, Bld 151 (H) 70 - 99 mg/dL    Comment: Please note change in reference range.   BUN 15 6 - 20 mg/dL    Comment: Please note change in reference range.   Creatinine, Ser 0.72 0.44 - 1.00 mg/dL   Calcium 9.7 8.9 - 10.3 mg/dL   Total Protein 7.4 6.5 - 8.1 g/dL   Albumin 4.1  3.5 - 5.0 g/dL   AST 44 (H) 15 - 41 U/L   ALT 55 (H) 0 - 44 U/L    Comment: Please note change in reference range.   Alkaline Phosphatase 93 38 - 126 U/L   Total Bilirubin 0.8 0.3 - 1.2 mg/dL   GFR calc non Af Amer >60 >60 mL/min   GFR calc Af Amer >60 >60 mL/min    Comment: (NOTE) The eGFR has been calculated using the CKD EPI equation. This calculation has not been validated in all clinical situations. eGFR's persistently <60 mL/min signify possible Chronic Kidney Disease.    Anion gap 10 5 - 15    Comment: Performed at [facility], [address] [place], [place] [address]  Ethanol     Status: None   Collection Time: 03/02/18  3:32 PM  Result Value Ref Range   Alcohol, Ethyl (B) <10 <10 mg/dL    Comment: (NOTE) Lowest detectable limit for serum alcohol is 10 mg/dL. For medical purposes only. Performed at [facility], [address] [place], [place] [address]   Salicylate level     Status: None   Collection Time: 03/02/18  3:32 PM  Result Value Ref Range   Salicylate Lvl <6.5 2.8 - 30.0 mg/dL    Comment: Performed at [facility], [address] [place], [place] [address]  Acetaminophen level     Status: Abnormal   Collection Time: 03/02/18   3:32 PM  Result Value Ref Range   Acetaminophen (Tylenol), Serum <10 (L) 10 - 30 ug/mL    Comment: (NOTE) Therapeutic concentrations vary significantly. A range of 10-30 ug/mL  may be an effective concentration for many patients. However, some  are best treated at concentrations outside of this range. Acetaminophen concentrations >150 ug/mL at 4 hours after ingestion  and >50 ug/mL at 12 hours after ingestion are often associated with  toxic reactions. Performed at [facility], [address] [place], [place] [address]   cbc     Status: Abnormal   Collection Time: 03/02/18  3:32 PM  Result Value Ref Range   WBC 8.6 3.6 - 11.0 K/uL   RBC 5.07 3.80 - 5.20 MIL/uL   Hemoglobin 14.0 12.0 - 16.0 g/dL   HCT 40.0 35.0 - 47.0 %   MCV 78.9 (L) 80.0 - 100.0 fL   MCH 27.6 26.0 - 34.0 pg   MCHC 35.0 32.0 - 36.0 g/dL   RDW 13.8 11.5 - 14.5 %   Platelets 284 150 - 440 K/uL    Comment: Performed at [facility], [address] [place], [place] [address]  Urine Drug Screen, Qualitative     Status: Abnormal   Collection Time: 03/02/18  3:32 PM  Result Value Ref Range   Tricyclic, Ur Screen NONE DETECTED NONE DETECTED   Amphetamines, Ur Screen NONE DETECTED NONE DETECTED   MDMA (Ecstasy)Ur Screen NONE DETECTED NONE DETECTED   Cocaine Metabolite,Ur [place] NONE DETECTED NONE DETECTED   Opiate, Ur Screen NONE DETECTED NONE DETECTED   Phencyclidine (PCP) Ur S NONE DETECTED NONE DETECTED   Cannabinoid 50 Ng, Ur [place] NONE DETECTED NONE DETECTED   Barbiturates, Ur Screen (A) NONE DETECTED    Result not available. Reagent lot number recalled by manufacturer.   Benzodiazepine, Ur Scrn NONE DETECTED NONE DETECTED   Methadone Scn, Ur NONE DETECTED NONE DETECTED    Comment: (NOTE) Tricyclics + metabolites, urine    Cutoff 1000 ng/mL Amphetamines + metabolites, urine  Cutoff 1000 ng/mL MDMA (Ecstasy), urine              Cutoff 500 ng/mL Cocaine Metabolite, urine          Cutoff 300  ng/mL Opiate + metabolites, urine        Cutoff 300 ng/mL Phencyclidine (PCP), urine         Cutoff 25 ng/mL Cannabinoid, urine                 Cutoff 50 ng/mL Barbiturates + metabolites, urine  Cutoff 200 ng/mL Benzodiazepine, urine              Cutoff 200 ng/mL Methadone, urine                   Cutoff 300 ng/mL The urine drug screen provides only a preliminary, unconfirmed analytical test result and should not be used for non-medical purposes. Clinical consideration and professional judgment should be applied to any positive drug screen result due to possible interfering substances. A more specific alternate chemical method must be used in order to obtain a confirmed analytical result. Gas chromatography / mass spectrometry (GC/MS) is the preferred confirmat ory method. Performed at [facility], [address] [place], [place] [address]   Lipase, blood     Status: None   Collection Time: 03/02/18  3:32 PM  Result Value Ref Range   Lipase 31 11 - 51 U/L    Comment: Performed at [facility], [address] [place], [place] [address]  POC urine preg, ED     Status: None   Collection Time: 03/02/18  4:40 PM  Result Value Ref Range   Preg Test, Ur Negative Negative    No current facility-administered medications for this encounter.    Current Outpatient Medications  Medication Sig Dispense Refill  . fluticasone (FLONASE SENSIMIST) 27.5 MCG/SPRAY nasal spray Place 2 sprays into the nose daily.    . traZODone (DESYREL) 100 MG tablet Take 50-100 mg by mouth at bedtime.    . busPIRone (BUSPAR) 15 MG tablet Take 0.5 tablets (7.5 mg total) by mouth 2 (two) times daily. 30 tablet 1  . fluticasone (FLONASE) 50 MCG/ACT nasal spray Place 2 sprays into both nostrils daily. (Patient not taking: Reported on 03/02/2018) 16 g 0  . lisinopril (PRINIVIL,ZESTRIL) 40 MG tablet Take 1 tablet (40 mg total) by mouth daily. (Patient not taking: Reported on 03/02/2018) 30 tablet 1  .  venlafaxine XR (EFFEXOR-XR) 150 MG 24 hr capsule Take 1 capsule (150 mg total) by mouth daily. 30 capsule 1    Musculoskeletal: Strength & Muscle Tone: within normal limits Gait & Station: normal Patient leans: N/A  Psychiatric Specialty Exam: Physical Exam  Nursing note and vitals reviewed. Constitutional: Leah Bright appears well-developed and well-nourished.  HENT:  Head: Normocephalic and atraumatic.  Eyes: Pupils are equal, round, and reactive to light. Conjunctivae are normal.  Neck: Normal range of motion.  Cardiovascular: Regular rhythm and normal heart sounds.  Respiratory: Effort normal. No respiratory distress.  GI: Soft.  Musculoskeletal: Normal range of motion.  Neurological: Leah Bright is alert.  Skin: Skin is warm and dry.  Psychiatric: Judgment normal. Her mood appears anxious. Her affect is blunt. Her speech is delayed. Leah Bright is slowed. Thought content is not paranoid. Cognition and memory are normal. Leah Bright exhibits a depressed mood. Leah Bright expresses no homicidal and no suicidal ideation.    Review of Systems  Constitutional: Negative.   HENT: Negative.   Eyes: Negative.  Respiratory: Negative.   Cardiovascular: Negative.   Gastrointestinal: Negative.   Musculoskeletal: Negative.   Skin: Negative.   Neurological: Negative.   Psychiatric/Behavioral: Positive for depression. Negative for hallucinations, memory loss, substance abuse and suicidal ideas. The patient is nervous/anxious and has insomnia.     Pulse 81, temperature 98.7 F (37.1 C), temperature source Oral, resp. rate (!) 22, height _0  (1.651 m), weight 129.7 kg (286 lb), last menstrual period 12/31/2017, SpO2 98 %.Body mass index is 47.59 kg/m.  General Appearance: Fairly Groomed  Eye Contact:  Fair  Speech:  Clear and Coherent  Volume:  Normal  Mood:  Anxious and Depressed  Affect:  Congruent  Thought Process:  Goal Directed  Orientation:  Full (Time, Place, and Person)  Thought Content:  Logical  Suicidal  Thoughts:  No  Homicidal Thoughts:  No  Memory:  Immediate;   Fair Recent;   Fair Remote;   Fair  Judgement:  Fair  Insight:  Fair  Psychomotor Activity:  Normal  Concentration:  Concentration: Fair  Recall:  [employer] of Knowledge:  Fair  Language:  Fair  Akathisia:  No  Handed:  Right  AIMS (if indicated):     Assets:  Desire for Improvement Housing Resilience Social Support  ADL's:  Intact  Cognition:  WNL  Sleep:        Treatment Plan Summary: Daily contact with patient to assess and evaluate symptoms and progress in treatment, Medication management and Plan Patient with anxiety and depression.  Patient is not suicidal not psychotic does not require inpatient treatment.  Leah Bright is specifically asking to change her medicine back to a combination of Effexor and buspirone.  Reviewed side effects of medication.  Patient is agreeable to a plan to discontinue her fluoxetine and begin Effexor extended release 150 mg a day.  Prescription written.  Also buspirone 7.5 mg twice a day which was her previous dose.  I am not going to give her a prescription for Ativan.  Leah Bright can continue her current trazodone.  Patient instructed to follow-up with her primary provider in the community.  Leah Bright agrees to plan.  Case reviewed with emergency room physician.  Disposition: No evidence of imminent risk to self or others at present.   Patient does not meet criteria for psychiatric inpatient admission. Supportive therapy provided about ongoing stressors. Discussed crisis plan, support from social network, calling 911, coming to the Emergency Department, and calling Suicide Hotline.  Alethia Berthold, MD 03/02/2018 5:32 PM

## 2018-03-02 NOTE — ED Provider Notes (Signed)
[facility] Emergency Department Provider Note   ____________________________________________   First MD Initiated Contact with Patient 03/02/18 1555     (approximate)  I have reviewed the triage vital signs and the nursing notes.   HISTORY  Chief Complaint Psychiatric Evaluation    HPI Leah Bright is a 27 y.o. female who reports she has a history of social anxiety business and is scared of being around people.  She used to take BuSpar and Effexor that used to work but she has been on it for couple years.  [facility] to [facility] they are giving her Prozac which she said does not work and want her to take group therapy which she says she is scared to go to.  She came in here tonight because her husband is very upset with her and threatening to have her committed apparently.  Patient denies any homicidal or suicidal ideation.  Patient reports she gets abdominal pain when she gets nervous and she is having some abdominal pain now.  She is also nervous now.   Past Medical History:  Diagnosis Date  . Anxiety   . Asthma   . Depression   . Hypertension   . Renal artery stenosis ([facility])   . Social anxiety disorder   . Thyroid disease     There are no active problems to display for this patient.   Past Surgical History:  Procedure Laterality Date  . CHOLECYSTECTOMY    . LAPAROSCOPIC GASTRIC BANDING    . LAPAROSCOPIC REPAIR AND REMOVAL OF GASTRIC BAND    . TONSILLECTOMY      Prior to Admission medications   Medication Sig Start Date End Date Taking? Authorizing Provider  fluticasone (FLONASE SENSIMIST) 27.5 MCG/SPRAY nasal spray Place 2 sprays into the nose daily.   Yes [provider]  traZODone (DESYREL) 100 MG tablet Take 50-100 mg by mouth at bedtime.   Yes [provider]  busPIRone (BUSPAR) 15 MG tablet Take 0.5 tablets (7.5 mg total) by mouth 2 (two) times daily. 03/02/18   Clapacs, Jackquline DenmarkJohn T, MD  fluticasone (FLONASE)  50 MCG/ACT nasal spray Place 2 sprays into both nostrils daily. Patient not taking: Reported on 03/02/2018 10/16/17   Menshew, Charlesetta IvoryJenise V Bacon, PA-C  lisinopril (PRINIVIL,ZESTRIL) 40 MG tablet Take 1 tablet (40 mg total) by mouth daily. Patient not taking: Reported on 03/02/2018 10/16/17 12/15/17  Menshew, Charlesetta IvoryJenise V Bacon, PA-C  venlafaxine XR (EFFEXOR-XR) 150 MG 24 hr capsule Take 1 capsule (150 mg total) by mouth daily. 03/02/18 05/01/18  Clapacs, Jackquline DenmarkJohn T, MD    Allergies Codeine; Pepto-bismol [bismuth]; and Toradol [ketorolac tromethamine]  History reviewed. No pertinent family history.  Social History Social History   Tobacco Use  . Smoking status: Never Smoker  . Smokeless tobacco: Never Used  Substance Use Topics  . Alcohol use: Yes    Comment: rarely  . Drug use: No    Review of Systems  Constitutional: No fever/chills Eyes: No visual changes. ENT: No sore throat. Cardiovascular: Denies chest pain. Respiratory: Denies shortness of breath. Gastrointestinal: See HPI abdominal pain.  No nausea, no vomiting.  No diarrhea.  No constipation. Genitourinary: Negative for dysuria. Musculoskeletal: Negative for back pain. Skin: Negative for rash. Neurological: Negative for headaches, focal weakness ____________________________________________   PHYSICAL EXAM:  VITAL SIGNS: ED Triage Vitals  Enc Vitals Group     BP --      Pulse Rate 03/02/18 1527 81     Resp 03/02/18  1527 (!) 22     Temp 03/02/18 1527 98.7 F (37.1 C)     Temp Source 03/02/18 1527 Oral     SpO2 03/02/18 1527 98 %     Weight 03/02/18 1528 286 lb (129.7 kg)     Height 03/02/18 1528 5\' 5"  (1.651 m)     Head Circumference --      Peak Flow --      Pain Score 03/02/18 1528 0     Pain Loc --      Pain Edu? --      Excl. in GC? --    Constitutional: Alert and oriented. Well appearing and in no acute distress but anxious. Eyes: Conjunctivae are normal. PER Head: Atraumatic. Nose: No  congestion/rhinnorhea. Mouth/Throat: Mucous membranes are moist.  Oropharynx non-erythematous. Neck: No stridor.  Cardiovascular: Normal rate, regular rhythm. Grossly normal heart sounds.  Good peripheral circulation. Respiratory: Normal respiratory effort.  No retractions. Lungs CTAB. Gastrointestinal: Soft mildly tender diffusely.. No distention. No abdominal bruits. No CVA tenderness. Musculoskeletal: No lower extremity tenderness nor edema.  No joint effusions. Neurologic:  Normal speech and language. No gross focal neurologic deficits are appreciated. No gait instability. Skin:  Skin is warm, dry and intact. No rash noted. Psychiatric: Mood and affect are normal. Speech and behavior are normal.  ____________________________________________   LABS (all labs ordered are listed, but only abnormal results are displayed)  Labs Reviewed  COMPREHENSIVE METABOLIC PANEL - Abnormal; Notable for the following components:      Result Value   Glucose, Bld 151 (*)    AST 44 (*)    ALT 55 (*)    All other components within normal limits  ACETAMINOPHEN LEVEL - Abnormal; Notable for the following components:   Acetaminophen (Tylenol), Serum <10 (*)    All other components within normal limits  CBC - Abnormal; Notable for the following components:   MCV 78.9 (*)    All other components within normal limits  ETHANOL  SALICYLATE LEVEL  LIPASE, BLOOD  URINE DRUG SCREEN, QUALITATIVE ([facility] ONLY)  POC URINE PREG, ED   ____________________________________________  EKG   ____________________________________________  RADIOLOGY  ED MD interpretation:  Official radiology report(s): No results found.  ____________________________________________   PROCEDURES  Procedure(s) performed:   Procedures  Critical Care performed:   ____________________________________________   INITIAL IMPRESSION / ASSESSMENT AND PLAN / ED COURSE  She is seen by Dr. Toni Amend.  He will change her  medicines and she will follow-up with [facility] as she is been trying to do.         ____________________________________________   FINAL CLINICAL IMPRESSION(S) / ED DIAGNOSES  Final diagnoses:  Social anxiety disorder     ED Discharge Orders        Ordered    venlafaxine XR (EFFEXOR-XR) 150 MG 24 hr capsule  Daily     03/02/18 1708    busPIRone (BUSPAR) 15 MG tablet  2 times daily     03/02/18 1708       Note:  This document was prepared using Dragon voice recognition software and may include unintentional dictation errors.    Arnaldo Natal, MD 03/02/18 [phone]

## 2018-03-15 ENCOUNTER — Encounter: Payer: Self-pay | Admitting: Emergency Medicine

## 2018-03-15 ENCOUNTER — Emergency Department
Admission: EM | Admit: 2018-03-15 | Discharge: 2018-03-15 | Disposition: A | Discharge status: Home / Self Care | Payer: Self-pay | Attending: Emergency Medicine | Admitting: Emergency Medicine

## 2018-03-15 ENCOUNTER — Emergency Department: Payer: Self-pay

## 2018-03-15 ENCOUNTER — Other Ambulatory Visit: Payer: Self-pay

## 2018-03-15 DIAGNOSIS — Y929 Unspecified place or not applicable: Secondary | ICD-10-CM | POA: Insufficient documentation

## 2018-03-15 DIAGNOSIS — S56109A Unspecified injury of flexor muscle, fascia and tendon of unspecified finger at forearm level, initial encounter: Secondary | ICD-10-CM

## 2018-03-15 DIAGNOSIS — F329 Major depressive disorder, single episode, unspecified: Secondary | ICD-10-CM | POA: Insufficient documentation

## 2018-03-15 DIAGNOSIS — S56117A Strain of flexor muscle, fascia and tendon of right little finger at forearm level, initial encounter: Secondary | ICD-10-CM | POA: Insufficient documentation

## 2018-03-15 DIAGNOSIS — Z79899 Other long term (current) drug therapy: Secondary | ICD-10-CM | POA: Insufficient documentation

## 2018-03-15 DIAGNOSIS — Y998 Other external cause status: Secondary | ICD-10-CM | POA: Insufficient documentation

## 2018-03-15 DIAGNOSIS — Z9884 Bariatric surgery status: Secondary | ICD-10-CM | POA: Insufficient documentation

## 2018-03-15 DIAGNOSIS — W231XXA Caught, crushed, jammed, or pinched between stationary objects, initial encounter: Secondary | ICD-10-CM | POA: Insufficient documentation

## 2018-03-15 DIAGNOSIS — Z9049 Acquired absence of other specified parts of digestive tract: Secondary | ICD-10-CM | POA: Insufficient documentation

## 2018-03-15 DIAGNOSIS — J45909 Unspecified asthma, uncomplicated: Secondary | ICD-10-CM | POA: Insufficient documentation

## 2018-03-15 DIAGNOSIS — F419 Anxiety disorder, unspecified: Secondary | ICD-10-CM | POA: Insufficient documentation

## 2018-03-15 DIAGNOSIS — Y9389 Activity, other specified: Secondary | ICD-10-CM | POA: Insufficient documentation

## 2018-03-15 DIAGNOSIS — S56119A Strain of flexor muscle, fascia and tendon of finger of unspecified finger at forearm level, initial encounter: Secondary | ICD-10-CM

## 2018-03-15 MED ORDER — MELOXICAM 15 MG PO TABS: 15.0000 mg | ORAL_TABLET | Freq: Every day | ORAL | 1 refills | Status: AC

## 2018-03-15 NOTE — ED Notes (Signed)
See triage note  Presents with pain to right hand  Thinks she may have gotten it caught in her dog's collar on Saturday   Having pain to right 4th and 5th fingers and lateral hand  Positive swelling

## 2018-03-15 NOTE — ED Triage Notes (Signed)
R hand pain x 2 days since got caught in dogs collar.

## 2018-03-16 NOTE — ED Provider Notes (Signed)
[facility] Emergency Department Provider Note  ____________________________________________  Time seen: Approximately 12:05 AM  I have reviewed the triage vital signs and the nursing notes.   HISTORY  Chief Complaint Hand Pain    HPI Leah Bright is a 27 y.o. female presents to the emergency department with right hand pain.  Patient reports that she caught her hand under her dog's collar 2 days ago.  Patient reports pain along the distribution of the flexor tendon of the fifth digit.  She is having difficulty making a fist and reports that pain radiates into her proximal hand.  She has taken ibuprofen but is attempted no other alleviating measures.  She currently rates her pain at 10 out of 10 in intensity and describes it as aching.    Past Medical History:  Diagnosis Date  . Anxiety   . Asthma   . Depression   . Hypertension   . Renal artery stenosis (HCC)   . Social anxiety disorder   . Thyroid disease     Patient Active Problem List   Diagnosis Date Noted  . 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  . Renal artery stenosis (HCC) 03/02/2018    Past Surgical History:  Procedure Laterality Date  . CHOLECYSTECTOMY    . LAPAROSCOPIC GASTRIC BANDING    . LAPAROSCOPIC REPAIR AND REMOVAL OF GASTRIC BAND    . TONSILLECTOMY      Prior to Admission medications   Medication Sig Start Date End Date Taking? Authorizing Provider  lisinopril (PRINIVIL,ZESTRIL) 40 MG tablet Take 40 mg by mouth daily.   Yes [provider]  busPIRone (BUSPAR) 15 MG tablet Take 0.5 tablets (7.5 mg total) by mouth 2 (two) times daily. 03/02/18   Clapacs, Jackquline Denmark, MD  fluticasone (FLONASE SENSIMIST) 27.5 MCG/SPRAY nasal spray Place 2 sprays into the nose daily.    [provider]  fluticasone (FLONASE) 50 MCG/ACT nasal spray Place 2 sprays into both nostrils daily. Patient not taking: Reported on 03/02/2018 10/16/17   Menshew, Charlesetta Ivory, PA-C   lisinopril (PRINIVIL,ZESTRIL) 40 MG tablet Take 1 tablet (40 mg total) by mouth daily. Patient not taking: Reported on 03/02/2018 10/16/17 12/15/17  Menshew, Charlesetta Ivory, PA-C  meloxicam (MOBIC) 15 MG tablet Take 1 tablet (15 mg total) by mouth daily for 7 days. 03/15/18 03/22/18  Orvil Feil, PA-C  traZODone (DESYREL) 100 MG tablet Take 50-100 mg by mouth at bedtime.    [provider]  venlafaxine XR (EFFEXOR-XR) 150 MG 24 hr capsule Take 1 capsule (150 mg total) by mouth daily. 03/02/18 05/01/18  Clapacs, Jackquline Denmark, MD    Allergies Codeine; Pepto-bismol [bismuth]; and Toradol [ketorolac tromethamine]  No family history on file.  Social History Social History   Tobacco Use  . Smoking status: Never Smoker  . Smokeless tobacco: Never Used  Substance Use Topics  . Alcohol use: Yes    Comment: rarely  . Drug use: No     Review of Systems  Constitutional: No fever/chills Eyes: No visual changes. No discharge ENT: No upper respiratory complaints. Cardiovascular: no chest pain. Respiratory: no cough. No SOB. Gastrointestinal: No abdominal pain.  No nausea, no vomiting.  No diarrhea.  No constipation. Genitourinary: Negative for dysuria. No hematuria Musculoskeletal: Patient has right hand pain. Skin: Negative for rash, abrasions, lacerations, ecchymosis. Neurological: Negative for headaches, focal weakness or numbness.   ____________________________________________   PHYSICAL EXAM:  VITAL SIGNS: ED Triage Vitals  Enc Vitals Group  BP 03/15/18 1814 (!) 164/111     Pulse Rate 03/15/18 1814 96     Resp 03/15/18 1814 20     Temp 03/15/18 1814 99.3 F (37.4 C)     Temp Source 03/15/18 1814 Oral     SpO2 03/15/18 1814 98 %     Weight 03/15/18 1816 286 lb (129.7 kg)     Height 03/15/18 1816 5\' 5"  (1.651 m)     Head Circumference --      Peak Flow --      Pain Score 03/15/18 1816 7     Pain Loc --      Pain Edu? --      Excl. in GC? --      Constitutional:  Alert and oriented. Well appearing and in no acute distress. Eyes: Conjunctivae are normal. PERRL. EOMI. Head: Atraumatic.  Cardiovascular: Normal rate, regular rhythm. Normal S1 and S2.  Good peripheral circulation. Respiratory: Normal respiratory effort without tachypnea or retractions. Lungs CTAB. Good air entry to the bases with no decreased or absent breath sounds. Musculoskeletal: Patient is able to perform flexion at both the PIP and DIP joints at the right fifth digit but not without pain. She has tenderness to palpation over the course of the flexor tendon.  She has no pain to palpation over the right fifth metacarpal.  She is able to perform full range of motion at the right wrist.  Palpable radial pulse, right. Neurologic:  Normal speech and language. No gross focal neurologic deficits are appreciated.  Skin:  Skin is warm, dry and intact. No rash noted. Psychiatric: Mood and affect are normal. Speech and behavior are normal. Patient exhibits appropriate insight and judgement.   ____________________________________________   LABS (all labs ordered are listed, but only abnormal results are displayed)  Labs Reviewed - No data to display ____________________________________________  EKG   ____________________________________________  RADIOLOGY I personally viewed and evaluated these images as part of my medical decision making, as well as reviewing the written report by the radiologist.  Dg Hand Complete Right  Result Date: 03/15/2018 CLINICAL DATA:  R hand pain x 2 days since got caught in dogs collar. EXAM: RIGHT HAND - COMPLETE 3+ VIEW COMPARISON:  None. FINDINGS: No evidence of fracture of the carpal or metacarpal bones. Radiocarpal joint is intact. Phalanges are normal. No soft tissue injury. IMPRESSION: No fracture or dislocation. Electronically Signed   By: Genevive Bi M.D.   On: 03/15/2018 18:51     ____________________________________________    PROCEDURES  Procedure(s) performed:    Procedures    Medications - No data to display   ____________________________________________   INITIAL IMPRESSION / ASSESSMENT AND PLAN / ED COURSE  Pertinent labs & imaging results that were available during my care of the patient were reviewed by me and considered in my medical decision making (see chart for details).  Review of the [place] CSRS was performed in accordance of the [facility] prior to dispensing any controlled drugs.      Assessment and plan Right hand pain Patient presents to the emergency department with right hand pain after she caught her hand in her dog's collar.  Patient had pain with performing flexion at the PIP and DIP joints of the right fifth digit and had pain to palpation along the course of the flexor tendon.  Patient's right fifth digit was splinted in the emergency department and she was referred to Dr. Stephenie Acres.  Meloxicam was given for pain.  All patient questions  were answered.   ____________________________________________  FINAL CLINICAL IMPRESSION(S) / ED DIAGNOSES  Final diagnoses:  Strain of flexor tendon of finger at forearm level      NEW MEDICATIONS STARTED DURING THIS VISIT:  ED Discharge Orders        Ordered    meloxicam (MOBIC) 15 MG tablet  Daily     03/15/18 1942          This chart was dictated using voice recognition software/Dragon. Despite best efforts to proofread, errors can occur which can change the meaning. Any change was purely unintentional.    Orvil FeilWoods, Codylee Patil M, PA-C 03/16/18 0011    Phineas SemenGoodman, [place], MD 03/16/18 [phone]

## 2018-05-13 ENCOUNTER — Other Ambulatory Visit: Payer: Self-pay

## 2018-05-13 ENCOUNTER — Other Ambulatory Visit: Payer: Self-pay | Admitting: Emergency Medicine

## 2018-05-13 ENCOUNTER — Emergency Department
Admission: EM | Admit: 2018-05-13 | Discharge: 2018-05-13 | Disposition: A | Discharge status: Home / Self Care | Payer: Self-pay | Attending: Emergency Medicine | Admitting: Emergency Medicine

## 2018-05-13 ENCOUNTER — Ambulatory Visit
Admission: RE | Admit: 2018-05-13 | Discharge: 2018-05-13 | Disposition: A | Discharge status: Home / Self Care | Payer: Self-pay | Source: Ambulatory Visit | Attending: Emergency Medicine | Admitting: Emergency Medicine

## 2018-05-13 ENCOUNTER — Encounter: Payer: Self-pay | Admitting: Emergency Medicine

## 2018-05-13 DIAGNOSIS — I10 Essential (primary) hypertension: Secondary | ICD-10-CM | POA: Insufficient documentation

## 2018-05-13 DIAGNOSIS — E079 Disorder of thyroid, unspecified: Secondary | ICD-10-CM | POA: Insufficient documentation

## 2018-05-13 DIAGNOSIS — N201 Calculus of ureter: Secondary | ICD-10-CM | POA: Insufficient documentation

## 2018-05-13 DIAGNOSIS — Z79899 Other long term (current) drug therapy: Secondary | ICD-10-CM | POA: Insufficient documentation

## 2018-05-13 DIAGNOSIS — R110 Nausea: Secondary | ICD-10-CM | POA: Insufficient documentation

## 2018-05-13 DIAGNOSIS — N261 Atrophy of kidney (terminal): Secondary | ICD-10-CM | POA: Insufficient documentation

## 2018-05-13 DIAGNOSIS — N200 Calculus of kidney: Secondary | ICD-10-CM | POA: Insufficient documentation

## 2018-05-13 DIAGNOSIS — R109 Unspecified abdominal pain: Secondary | ICD-10-CM | POA: Insufficient documentation

## 2018-05-13 DIAGNOSIS — K760 Fatty (change of) liver, not elsewhere classified: Secondary | ICD-10-CM | POA: Insufficient documentation

## 2018-05-13 DIAGNOSIS — R160 Hepatomegaly, not elsewhere classified: Secondary | ICD-10-CM | POA: Insufficient documentation

## 2018-05-13 DIAGNOSIS — N83202 Unspecified ovarian cyst, left side: Secondary | ICD-10-CM | POA: Insufficient documentation

## 2018-05-13 DIAGNOSIS — J45909 Unspecified asthma, uncomplicated: Secondary | ICD-10-CM | POA: Insufficient documentation

## 2018-05-13 DIAGNOSIS — N1339 Other hydronephrosis: Secondary | ICD-10-CM | POA: Insufficient documentation

## 2018-05-13 LAB — URINALYSIS, COMPLETE (UACMP) WITH MICROSCOPIC
Bilirubin Urine: NEGATIVE
GLUCOSE, UA: 50 mg/dL — AB
KETONES UR: NEGATIVE mg/dL
Nitrite: NEGATIVE
PROTEIN: 100 mg/dL — AB
Specific Gravity, Urine: 1.015 (ref 1.005–1.030)
pH: 5 (ref 5.0–8.0)

## 2018-05-13 LAB — CBC WITH DIFFERENTIAL/PLATELET
BASOS ABS: 0.1 10*3/uL (ref 0–0.1)
BASOS PCT: 1 %
Eosinophils Absolute: 0.2 10*3/uL (ref 0–0.7)
Eosinophils Relative: 2 %
HEMATOCRIT: 39.8 % (ref 35.0–47.0)
Hemoglobin: 13.6 g/dL (ref 12.0–16.0)
Lymphocytes Relative: 40 %
Lymphs Abs: 3.3 10*3/uL (ref 1.0–3.6)
MCH: 27.3 pg (ref 26.0–34.0)
MCHC: 34.3 g/dL (ref 32.0–36.0)
MCV: 79.5 fL — ABNORMAL LOW (ref 80.0–100.0)
MONO ABS: 0.4 10*3/uL (ref 0.2–0.9)
Monocytes Relative: 5 %
Neutro Abs: 4.3 10*3/uL (ref 1.4–6.5)
Neutrophils Relative %: 52 %
PLATELETS: 305 10*3/uL (ref 150–440)
RBC: 5 MIL/uL (ref 3.80–5.20)
RDW: 14 % (ref 11.5–14.5)
WBC: 8.3 10*3/uL (ref 3.6–11.0)

## 2018-05-13 LAB — COMPREHENSIVE METABOLIC PANEL
ALBUMIN: 3.9 g/dL (ref 3.5–5.0)
ALT: 84 U/L — AB (ref 0–44)
AST: 62 U/L — AB (ref 15–41)
Alkaline Phosphatase: 83 U/L (ref 38–126)
Anion gap: 7 (ref 5–15)
BILIRUBIN TOTAL: 0.4 mg/dL (ref 0.3–1.2)
BUN: 12 mg/dL (ref 6–20)
CHLORIDE: 105 mmol/L (ref 98–111)
CO2: 26 mmol/L (ref 22–32)
CREATININE: 0.87 mg/dL (ref 0.44–1.00)
Calcium: 8.9 mg/dL (ref 8.9–10.3)
GFR calc Af Amer: >60 mL/min (ref >60)
GFR calc non Af Amer: >60 mL/min (ref >60)
GLUCOSE: 266 mg/dL — AB (ref 70–99)
Potassium: 4.2 mmol/L (ref 3.5–5.1)
Sodium: 138 mmol/L (ref 135–145)
Total Protein: 7 g/dL (ref 6.5–8.1)

## 2018-05-13 LAB — HCG, QUANTITATIVE, PREGNANCY: HCG, BETA CHAIN, QUANT, S: 1 m[IU]/mL (ref <5)

## 2018-05-13 MED ORDER — ONDANSETRON HCL 4 MG/2ML IJ SOLN
INTRAMUSCULAR | Status: AC
  Filled 2018-05-13: qty 2

## 2018-05-13 MED ORDER — KETOROLAC TROMETHAMINE 30 MG/ML IJ SOLN
INTRAMUSCULAR | Status: AC
  Filled 2018-05-13: qty 1

## 2018-05-13 MED ORDER — HYDROCODONE-ACETAMINOPHEN 5-325 MG PO TABS
ORAL_TABLET | ORAL | Status: AC
  Filled 2018-05-13: qty 2

## 2018-05-13 MED ORDER — MORPHINE SULFATE (PF) 4 MG/ML IV SOLN
INTRAVENOUS | Status: DC
Start: 2018-05-13 — End: 2018-05-13
  Filled 2018-05-13: qty 2

## 2018-05-13 NOTE — ED Provider Notes (Signed)
[facility] Emergency Department Provider Note  ____________________________________________   First MD Initiated Contact with Patient 05/13/18 0105     (approximate)  I have reviewed the triage vital signs and the nursing notes.   HISTORY  Chief Complaint Flank Pain   HPI Leah Bright is a 27 y.o. female who comes to the emergency department with sudden onset severe left flank pain and left upper abdominal pain associated with nausea that began several hours prior to arrival.  No dysuria frequency or hesitancy.  No fevers or chills.  She has not vomited.  No history of kidney stones.  No chest pain or shortness of breath.  The pain began suddenly has been mostly constant and nothing seems to make it better or worse.    Past Medical History:  Diagnosis Date  . Anxiety   . Asthma   . Depression   . Hypertension   . Renal artery stenosis (HCC)   . Social anxiety disorder   . Thyroid disease     Patient Active Problem List   Diagnosis Date Noted  . 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  . Renal artery stenosis (HCC) 03/02/2018    Past Surgical History:  Procedure Laterality Date  . CHOLECYSTECTOMY    . LAPAROSCOPIC GASTRIC BANDING    . LAPAROSCOPIC REPAIR AND REMOVAL OF GASTRIC BAND    . TONSILLECTOMY      Prior to Admission medications   Medication Sig Start Date End Date Taking? Authorizing Provider  busPIRone (BUSPAR) 15 MG tablet Take 0.5 tablets (7.5 mg total) by mouth 2 (two) times daily. 03/02/18   Clapacs, Jackquline Denmark, MD  fluticasone (FLONASE SENSIMIST) 27.5 MCG/SPRAY nasal spray Place 2 sprays into the nose daily.    [provider]  fluticasone (FLONASE) 50 MCG/ACT nasal spray Place 2 sprays into both nostrils daily. Patient not taking: Reported on 03/02/2018 10/16/17   Menshew, Charlesetta Ivory, PA-C  lisinopril (PRINIVIL,ZESTRIL) 40 MG tablet Take 1 tablet (40 mg total) by mouth daily. Patient not taking: Reported  on 03/02/2018 10/16/17 12/15/17  Menshew, Charlesetta Ivory, PA-C  lisinopril (PRINIVIL,ZESTRIL) 40 MG tablet Take 40 mg by mouth daily.    [provider]  traZODone (DESYREL) 100 MG tablet Take 50-100 mg by mouth at bedtime.    [provider]  venlafaxine XR (EFFEXOR-XR) 150 MG 24 hr capsule Take 1 capsule (150 mg total) by mouth daily. 03/02/18 05/01/18  Clapacs, Jackquline Denmark, MD    Allergies Codeine; Pepto-bismol [bismuth]; and Toradol [ketorolac tromethamine]  No family history on file.  Social History Social History   Tobacco Use  . Smoking status: Never Smoker  . Smokeless tobacco: Never Used  Substance Use Topics  . Alcohol use: Yes    Comment: rarely  . Drug use: No    Review of Systems Constitutional: No fever/chills Eyes: No visual changes. ENT: No sore throat. Cardiovascular: Denies chest pain. Respiratory: Denies shortness of breath. Gastrointestinal: Positive for abdominal pain.  Positive for nausea, no vomiting.  No diarrhea.  No constipation. Genitourinary: Negative for dysuria. Musculoskeletal: Positive for back pain. Skin: Negative for rash. Neurological: Negative for headaches, focal weakness or numbness.   ____________________________________________   PHYSICAL EXAM:  VITAL SIGNS: ED Triage Vitals  Enc Vitals Group     BP 05/13/18 0055 (!) 172/99     Pulse Rate 05/13/18 0055 83     Resp 05/13/18 0055 (!) 22     Temp 05/13/18 0055 98.2 F (36.8  C)     Temp Source 05/13/18 0055 Oral     SpO2 05/13/18 0055 96 %     Weight 05/13/18 0054 300 lb (136.1 kg)     Height 05/13/18 0054 5\' 4"  (1.626 m)     Head Circumference --      Peak Flow --      Pain Score 05/13/18 0054 9     Pain Loc --      Pain Edu? --      Excl. in GC? --     Constitutional: Alert and oriented x4 tearful and uncomfortable appearing Eyes: PERRL EOMI. Head: Atraumatic. Nose: No congestion/rhinnorhea. Mouth/Throat: No trismus Neck: No stridor.   Cardiovascular:  Normal rate, regular rhythm. Grossly normal heart sounds.  Good peripheral circulation. Respiratory: Normal respiratory effort.  No retractions. Lungs CTAB and moving good air Gastrointestinal: Soft obese abdomen positive for left lower quadrant tenderness although negative Rovsing's no rebound or guarding no peritonitis no costovertebral tenderness Musculoskeletal: No lower extremity edema   Neurologic:  Normal speech and language. No gross focal neurologic deficits are appreciated. Skin:  Skin is warm, dry and intact. No rash noted. Psychiatric: Mood and affect are normal. Speech and behavior are normal.    ____________________________________________   DIFFERENTIAL includes but not limited to  Pyelonephritis, nephrolithiasis, infected stone, appendicitis ____________________________________________   LABS (all labs ordered are listed, but only abnormal results are displayed)  Labs Reviewed  URINALYSIS, COMPLETE (UACMP) WITH MICROSCOPIC - Abnormal; Notable for the following components:      Result Value   Color, Urine YELLOW (*)    APPearance CLOUDY (*)    Glucose, UA 50 (*)    Hgb urine dipstick LARGE (*)    Protein, ur 100 (*)    Leukocytes, UA TRACE (*)    RBC / HPF >50 (*)    Bacteria, UA RARE (*)    All other components within normal limits  CBC WITH DIFFERENTIAL/PLATELET - Abnormal; Notable for the following components:   MCV 79.5 (*)    All other components within normal limits  COMPREHENSIVE METABOLIC PANEL - Abnormal; Notable for the following components:   Glucose, Bld 266 (*)    AST 62 (*)    ALT 84 (*)    All other components within normal limits  HCG, QUANTITATIVE, PREGNANCY    Lab work reviewed by me shows large amount of hematuria.  Also shows elevated transaminases likely secondary to fatty liver __________________________________________  EKG   ____________________________________________  RADIOLOGY  CT stone protocol reviewed by me shows  left-sided stone ____________________________________________   PROCEDURES  Procedure(s) performed: no  Procedures  Critical Care performed: no  ____________________________________________   INITIAL IMPRESSION / ASSESSMENT AND PLAN / ED COURSE  Pertinent labs & imaging results that were available during my care of the patient were reviewed by me and considered in my medical decision making (see chart for details).   As part of my medical decision making, I reviewed the following data within the electronic MEDICAL RECORD NUMBER History obtained from family if available, nursing notes, old chart and ekg, as well as notes from prior ED visits.  The patient comes the emergency department with sudden onset severe left-sided flank and abdominal pain.  She is exquisitely uncomfortable so was given IV morphine along with IV Toradol with improvement in her symptoms.  As this would be a first lifetime stone and she has particular abdominal tenderness I am concerned about an infection as well.  CT scan is  pending.     The patient CT is back showing a left-sided stone.  She has normal renal function and no evidence of infection.  I will prescribe her a short course of Norco and Flomax as well as Zofran and refer her to urology. ____________________________________________   FINAL CLINICAL IMPRESSION(S) / ED DIAGNOSES  Final diagnoses:  Kidney stone      NEW MEDICATIONS STARTED DURING THIS VISIT:  Discharge Medication List as of 05/13/2018  5:54 AM       Note:  This document was prepared using Dragon voice recognition software and may include unintentional dictation errors.     Merrily Brittle, MD 05/14/18 [phone]

## 2018-05-13 NOTE — ED Notes (Signed)
  Downtime just ended.

## 2018-05-13 NOTE — ED Triage Notes (Signed)
Patient ambulatory to triage with steady gait, without difficulty; appears uncomfortable; c/o left flank, lower abd pain accomp by nausea

## 2018-05-28 ENCOUNTER — Encounter: Payer: Self-pay | Admitting: Emergency Medicine

## 2018-05-28 ENCOUNTER — Emergency Department: Payer: Self-pay

## 2018-05-28 ENCOUNTER — Other Ambulatory Visit: Payer: Self-pay

## 2018-05-28 ENCOUNTER — Emergency Department
Admission: EM | Admit: 2018-05-28 | Discharge: 2018-05-28 | Disposition: A | Discharge status: Home / Self Care | Payer: Self-pay | Attending: Emergency Medicine | Admitting: Emergency Medicine

## 2018-05-28 DIAGNOSIS — E039 Hypothyroidism, unspecified: Secondary | ICD-10-CM | POA: Insufficient documentation

## 2018-05-28 DIAGNOSIS — J45909 Unspecified asthma, uncomplicated: Secondary | ICD-10-CM | POA: Insufficient documentation

## 2018-05-28 DIAGNOSIS — I10 Essential (primary) hypertension: Secondary | ICD-10-CM | POA: Insufficient documentation

## 2018-05-28 DIAGNOSIS — Z79899 Other long term (current) drug therapy: Secondary | ICD-10-CM | POA: Insufficient documentation

## 2018-05-28 DIAGNOSIS — M79671 Pain in right foot: Secondary | ICD-10-CM | POA: Insufficient documentation

## 2018-05-28 MED ORDER — IBUPROFEN 600 MG PO TABS: 600.0000 mg | ORAL_TABLET | Freq: Four times a day (QID) | ORAL | 0 refills | Status: DC | PRN

## 2018-05-28 NOTE — ED Provider Notes (Signed)
[facility] Emergency Department Provider Note  ____________________________________________  Time seen: Approximately 12:34 PM  I have reviewed the triage vital signs and the nursing notes.   HISTORY  Chief Complaint Foot Pain    HPI Leah Bright is a 27 y.o. female that presents to the emergency department for evaluation of right foot pain for 1 day.  Patient states that she had a stress fracture near this spot 4 to 5 years ago.  She has been on her feet a lot the last couple of days because she is moving.  No specific trauma.  Pain is worse when she moves her great toe.   Past Medical History:  Diagnosis Date  . Anxiety   . Asthma   . Depression   . Hypertension   . Renal artery stenosis (HCC)   . Social anxiety disorder   . Thyroid disease     Patient Active Problem List   Diagnosis Date Noted  . 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  . Renal artery stenosis (HCC) 03/02/2018    Past Surgical History:  Procedure Laterality Date  . CHOLECYSTECTOMY    . LAPAROSCOPIC GASTRIC BANDING    . LAPAROSCOPIC REPAIR AND REMOVAL OF GASTRIC BAND    . TONSILLECTOMY      Prior to Admission medications   Medication Sig Start Date End Date Taking? Authorizing Provider  busPIRone (BUSPAR) 15 MG tablet Take 0.5 tablets (7.5 mg total) by mouth 2 (two) times daily. 03/02/18   Clapacs, Jackquline Denmark, MD  fluticasone (FLONASE SENSIMIST) 27.5 MCG/SPRAY nasal spray Place 2 sprays into the nose daily.    [provider]  fluticasone (FLONASE) 50 MCG/ACT nasal spray Place 2 sprays into both nostrils daily. Patient not taking: Reported on 03/02/2018 10/16/17   Menshew, Charlesetta Ivory, PA-C  ibuprofen (ADVIL,MOTRIN) 600 MG tablet Take 1 tablet (600 mg total) by mouth every 6 (six) hours as needed. 05/28/18   Enid Derry, PA-C  lisinopril (PRINIVIL,ZESTRIL) 40 MG tablet Take 1 tablet (40 mg total) by mouth daily. Patient not taking: Reported on 03/02/2018  10/16/17 12/15/17  Menshew, Charlesetta Ivory, PA-C  lisinopril (PRINIVIL,ZESTRIL) 40 MG tablet Take 40 mg by mouth daily.    [provider]  traZODone (DESYREL) 100 MG tablet Take 50-100 mg by mouth at bedtime.    [provider]  venlafaxine XR (EFFEXOR-XR) 150 MG 24 hr capsule Take 1 capsule (150 mg total) by mouth daily. 03/02/18 05/01/18  Clapacs, Jackquline Denmark, MD    Allergies Codeine; Pepto-bismol [bismuth]; and Toradol [ketorolac tromethamine]  No family history on file.  Social History Social History   Tobacco Use  . Smoking status: Never Smoker  . Smokeless tobacco: Never Used  Substance Use Topics  . Alcohol use: Yes    Comment: rarely  . Drug use: No     Review of Systems  Constitutional: No fever/chills Cardiovascular: No chest pain. Respiratory:  No SOB. Gastrointestinal: No abdominal pain.  No nausea, no vomiting.  Musculoskeletal: Positive for foot pain.  Skin: Negative for rash, abrasions, lacerations, ecchymosis. Neurological: Negative for numbness or tingling    ____________________________________________   PHYSICAL EXAM:  VITAL SIGNS: ED Triage Vitals  Enc Vitals Group     BP 05/28/18 1115 (!) 156/100     Pulse Rate 05/28/18 1115 100     Resp 05/28/18 1115 16     Temp 05/28/18 1115 99.1 F (37.3 C)     Temp Source 05/28/18 1115 Oral  SpO2 05/28/18 1115 98 %     Weight 05/28/18 1116 297 lb (134.7 kg)     Height 05/28/18 1116 5' 4.5" (1.638 m)     Head Circumference --      Peak Flow --      Pain Score 05/28/18 1116 8     Pain Loc --      Pain Edu? --      Excl. in GC? --      Constitutional: Alert and oriented. Well appearing and in no acute distress. Eyes: Conjunctivae are normal. PERRL. EOMI. Head: Atraumatic. ENT:      Ears:      Nose: No congestion/rhinnorhea.      Mouth/Throat: Mucous membranes are moist.  Neck: No stridor.  Cardiovascular: Normal rate, regular rhythm.  Good peripheral circulation. Respiratory:  Normal respiratory effort without tachypnea or retractions. Lungs CTAB. Good air entry to the bases with no decreased or absent breath sounds. Musculoskeletal: Full range of motion to all extremities. No gross deformities appreciated.  Tenderness to palpation over plantar ball of right foot.  No swelling, ecchymosis, erythema.  No wounds. Neurologic:  Normal speech and language. No gross focal neurologic deficits are appreciated.  Skin:  Skin is warm, dry and intact. No rash noted. Psychiatric: Mood and affect are normal. Speech and behavior are normal. Patient exhibits appropriate insight and judgement.   ____________________________________________   LABS (all labs ordered are listed, but only abnormal results are displayed)  Labs Reviewed - No data to display ____________________________________________  EKG   ____________________________________________  RADIOLOGY Lexine Baton, personally viewed and evaluated these images (plain radiographs) as part of my medical decision making, as well as reviewing the written report by the radiologist.  Dg Foot Complete Right  Result Date: 05/28/2018 CLINICAL DATA:  Foot pain, no known injury, initial encounter EXAM: RIGHT FOOT COMPLETE - 3+ VIEW COMPARISON:  None FINDINGS: There is no evidence of fracture or dislocation. There is no evidence of arthropathy or other focal bone abnormality. Soft tissues are unremarkable. IMPRESSION: No acute abnormality noted. Electronically Signed   By: Alcide Clever M.D.   On: 05/28/2018 12:01    ____________________________________________    PROCEDURES  Procedure(s) performed:    Procedures    Medications - No data to display   ____________________________________________   INITIAL IMPRESSION / ASSESSMENT AND PLAN / ED COURSE  Pertinent labs & imaging results that were available during my care of the patient were reviewed by me and considered in my medical decision making (see chart for  details).  Review of the [place] CSRS was performed in accordance of the [facility] prior to dispensing any controlled drugs.   Patient presented to the emergency department for evaluation of right foot pain for 1 day.  Exam is reassuring.  Foot x-ray is negative for acute bony abnormality.  Crutches and postop shoe were given.  Patient will be discharged home with prescriptions for ibuprofen. Patient is to follow up with podiatry as directed. Patient is given ED precautions to return to the ED for any worsening or new symptoms.     ____________________________________________  FINAL CLINICAL IMPRESSION(S) / ED DIAGNOSES  Final diagnoses:  Foot pain, right      NEW MEDICATIONS STARTED DURING THIS VISIT:  ED Discharge Orders         Ordered    ibuprofen (ADVIL,MOTRIN) 600 MG tablet  Every 6 hours PRN     05/28/18 1247  This chart was dictated using voice recognition software/Dragon. Despite best efforts to proofread, errors can occur which can change the meaning. Any change was purely unintentional.    Enid Derry, PA-C 05/28/18 1629    Pershing Proud Myra Rude, MD 05/31/18 [phone]

## 2018-05-28 NOTE — ED Triage Notes (Signed)
Patient states she feels like she has a "stress fracture" in her right foot.  States she has had one before 4-5 years ago.  Denies injury.  Indicates front of foot "I can't put any pressure on it".  States she slept poorly last PM.

## 2018-06-11 ENCOUNTER — Emergency Department (HOSPITAL_COMMUNITY)
Admission: EM | Admit: 2018-06-11 | Discharge: 2018-06-11 | Disposition: A | Discharge status: Home / Self Care | Payer: Self-pay | Attending: Emergency Medicine | Admitting: Emergency Medicine

## 2018-06-11 ENCOUNTER — Encounter (HOSPITAL_COMMUNITY): Payer: Self-pay | Admitting: Emergency Medicine

## 2018-06-11 ENCOUNTER — Other Ambulatory Visit: Payer: Self-pay

## 2018-06-11 DIAGNOSIS — H6123 Impacted cerumen, bilateral: Secondary | ICD-10-CM | POA: Insufficient documentation

## 2018-06-11 DIAGNOSIS — H65191 Other acute nonsuppurative otitis media, right ear: Secondary | ICD-10-CM | POA: Insufficient documentation

## 2018-06-11 DIAGNOSIS — I10 Essential (primary) hypertension: Secondary | ICD-10-CM | POA: Insufficient documentation

## 2018-06-11 DIAGNOSIS — Z79899 Other long term (current) drug therapy: Secondary | ICD-10-CM | POA: Insufficient documentation

## 2018-06-11 DIAGNOSIS — J45909 Unspecified asthma, uncomplicated: Secondary | ICD-10-CM | POA: Insufficient documentation

## 2018-06-11 MED ORDER — DOCUSATE SODIUM 50 MG/5ML PO LIQD
100.0000 mg | Freq: Once | ORAL | Status: AC
  Administered 2018-06-11: 100 mg via ORAL
  Filled 2018-06-11: qty 10

## 2018-06-11 MED ORDER — AMOXICILLIN 500 MG PO CAPS: 500.0000 mg | ORAL_CAPSULE | Freq: Three times a day (TID) | ORAL | 0 refills | Status: DC

## 2018-06-11 MED ORDER — LISINOPRIL 40 MG PO TABS: 40.0000 mg | ORAL_TABLET | Freq: Every day | ORAL | 2 refills | Status: DC

## 2018-06-11 MED ORDER — NEOMYCIN-POLYMYXIN-HC 3.5-10000-1 OT SUSP
4.0000 [drp] | Freq: Three times a day (TID) | OTIC | 0 refills | Status: DC
Start: 2018-06-11 — End: 2018-06-29

## 2018-06-11 MED ORDER — NEOMYCIN-POLYMYXIN-HC 3.5-10000-1 OT SUSP: 4.0000 [drp] | Freq: Three times a day (TID) | OTIC | 0 refills | Status: DC

## 2018-06-11 MED ORDER — HYDROGEN PEROXIDE 3 % EX SOLN
CUTANEOUS | Status: AC
  Filled 2018-06-11: qty 473

## 2018-06-11 NOTE — Discharge Instructions (Signed)
Return if any problems.

## 2018-06-11 NOTE — ED Notes (Signed)
Attempted ear wax removal to right ear with water and peroxide with little out and pt grimacing in pain. Pa aware. Same to left ear.

## 2018-06-11 NOTE — ED Provider Notes (Signed)
[facility] EMERGENCY DEPARTMENT Provider Note   CSN: [id number] Arrival date & time: 06/11/18  1232     History   Chief Complaint Chief Complaint  Patient presents with  . Otalgia    HPI Leah Bright is a 27 y.o. female.  The history is provided by the patient. No language interpreter was used.  Otalgia  This is a new problem. There is pain in the right ear. The problem occurs constantly. The problem has been gradually worsening. There has been no fever. The fever has been present for less than 1 day. The pain is moderate. Associated symptoms include ear discharge. Her past medical history is significant for hearing loss.  Pt complains of right ear pain.  Pt has used multiple drops.  Pt is out of blood pressure medications no local MD   Past Medical History:  Diagnosis Date  . Anxiety   . Asthma   . Depression   . Hypertension   . Renal artery stenosis (HCC)   . Social anxiety disorder   . Thyroid disease     Patient Active Problem List   Diagnosis Date Noted  . 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  . Renal artery stenosis (HCC) 03/02/2018    Past Surgical History:  Procedure Laterality Date  . CHOLECYSTECTOMY    . LAPAROSCOPIC GASTRIC BANDING    . LAPAROSCOPIC REPAIR AND REMOVAL OF GASTRIC BAND    . TONSILLECTOMY       OB History   None      Home Medications    Prior to Admission medications   Medication Sig Start Date End Date Taking? Authorizing Provider  amoxicillin (AMOXIL) 500 MG capsule Take 1 capsule (500 mg total) by mouth 3 (three) times daily. 06/11/18   Elson Areas, PA-C  busPIRone (BUSPAR) 15 MG tablet Take 0.5 tablets (7.5 mg total) by mouth 2 (two) times daily. 03/02/18   Clapacs, Jackquline Denmark, MD  fluticasone (FLONASE SENSIMIST) 27.5 MCG/SPRAY nasal spray Place 2 sprays into the nose daily.    [provider]  fluticasone (FLONASE) 50 MCG/ACT nasal spray Place 2 sprays into both nostrils daily. Patient not  taking: Reported on 03/02/2018 10/16/17   Menshew, Charlesetta Ivory, PA-C  ibuprofen (ADVIL,MOTRIN) 600 MG tablet Take 1 tablet (600 mg total) by mouth every 6 (six) hours as needed. 05/28/18   Enid Derry, PA-C  lisinopril (PRINIVIL,ZESTRIL) 40 MG tablet Take 1 tablet (40 mg total) by mouth daily. 06/11/18   Elson Areas, PA-C  neomycin-polymyxin-hydrocortisone (CORTISPORIN) 3.5-10000-1 OTIC suspension Place 4 drops into the right ear 3 (three) times daily. 06/11/18   Elson Areas, PA-C  neomycin-polymyxin-hydrocortisone (CORTISPORIN) 3.5-10000-1 OTIC suspension Place 4 drops into the right ear 3 (three) times daily. 06/11/18   Elson Areas, PA-C  traZODone (DESYREL) 100 MG tablet Take 50-100 mg by mouth at bedtime.    [provider]  venlafaxine XR (EFFEXOR-XR) 150 MG 24 hr capsule Take 1 capsule (150 mg total) by mouth daily. 03/02/18 05/01/18  Clapacs, Jackquline Denmark, MD    Family History History reviewed. No pertinent family history.  Social History Social History   Tobacco Use  . Smoking status: Never Smoker  . Smokeless tobacco: Never Used  Substance Use Topics  . Alcohol use: Yes    Comment: rarely  . Drug use: No     Allergies   Codeine; Pepto-bismol [bismuth]; and Toradol [ketorolac tromethamine]   Review of Systems Review of Systems  HENT: Positive for  ear discharge and ear pain.   All other systems reviewed and are negative.    Physical Exam Updated Vital Signs BP (!) 162/108   Pulse 91   Temp 98.6 F (37 C)   Resp 19   Ht 5\' 4"  (1.626 m)   Wt 133.4 kg   LMP 05/13/2018 (Exact Date) Comment: neg. preg test  SpO2 99%   BMI 50.46 kg/m   Physical Exam  Constitutional: She appears well-developed and well-nourished.  HENT:  Head: Normocephalic.  Nose: Nose normal.  Mouth/Throat: Oropharynx is clear and moist.  bilat tm's occluded with wax.   Eyes: Pupils are equal, round, and reactive to light.  Cardiovascular: Normal rate.  Pulmonary/Chest:  Effort normal.  Musculoskeletal: Normal range of motion.  Neurological: She is alert.  Skin: Skin is warm.  Psychiatric: She has a normal mood and affect.  Nursing note and vitals reviewed.    ED Treatments / Results  Labs (all labs ordered are listed, but only abnormal results are displayed) Labs Reviewed - No data to display  EKG None  Radiology No results found.  Procedures .Ear Cerumen Removal Date/Time: 06/11/2018 4:22 PM Performed by: Elson Areas, PA-C Authorized by: Elson Areas, PA-C   Consent:    Consent obtained:  Verbal   Consent given by:  Patient   Risks discussed:  Bleeding and dizziness   Alternatives discussed:  No treatment Procedure details:    Location:  L ear and R ear   Procedure type: irrigation   Post-procedure details:    Inspection:  TM intact   Patient tolerance of procedure:  Tolerated well, no immediate complications Comments:     Right tm erythematous, canal swollen left tm clear    (including critical care time)  Medications Ordered in ED Medications  hydrogen peroxide 3 % external solution (has no administration in time range)  docusate (COLACE) 50 MG/5ML liquid 100 mg (100 mg Oral Given 06/11/18 1427)     Initial Impression / Assessment and Plan / ED Course  I have reviewed the triage vital signs and the nursing notes.  Pertinent labs & imaging results that were available during my care of the patient were reviewed by me and considered in my medical decision making (see chart for details).   MDM  Pt given rx for lisinopril until she can find local MD.  Pt given referrals Rx for amoxicillin and cortisporin otic   Final Clinical Impressions(s) / ED Diagnoses   Final diagnoses:  Other non-recurrent acute nonsuppurative otitis media of right ear  Bilateral impacted cerumen    ED Discharge Orders         Ordered    amoxicillin (AMOXIL) 500 MG capsule  3 times daily,   Status:  Discontinued     06/11/18 1501     neomycin-polymyxin-hydrocortisone (CORTISPORIN) 3.5-10000-1 OTIC suspension  3 times daily     06/11/18 1501    amoxicillin (AMOXIL) 500 MG capsule  3 times daily,   Status:  Discontinued     06/11/18 1509    neomycin-polymyxin-hydrocortisone (CORTISPORIN) 3.5-10000-1 OTIC suspension  3 times daily     06/11/18 1509    amoxicillin (AMOXIL) 500 MG capsule  3 times daily     06/11/18 1509    lisinopril (PRINIVIL,ZESTRIL) 40 MG tablet  Daily     06/11/18 1511        An After Visit Summary was printed and given to the patient.    Elson Areas, [place] 06/11/18 1624  Doug Sou, MD 06/11/18 [phone]

## 2018-06-11 NOTE — ED Triage Notes (Signed)
2 week hx of ear infection per pt R ear Now sound is muffled

## 2018-06-29 ENCOUNTER — Encounter: Payer: Self-pay | Admitting: Physician Assistant

## 2018-06-29 ENCOUNTER — Ambulatory Visit: Payer: Self-pay | Admitting: Physician Assistant

## 2018-06-29 VITALS — BP 150/98 | HR 97 | Temp 97.0°F | Ht 65.0 in | Wt 309.5 lb

## 2018-06-29 DIAGNOSIS — E039 Hypothyroidism, unspecified: Secondary | ICD-10-CM

## 2018-06-29 DIAGNOSIS — Z131 Encounter for screening for diabetes mellitus: Secondary | ICD-10-CM

## 2018-06-29 DIAGNOSIS — Z13220 Encounter for screening for lipoid disorders: Secondary | ICD-10-CM

## 2018-06-29 DIAGNOSIS — R748 Abnormal levels of other serum enzymes: Secondary | ICD-10-CM

## 2018-06-29 DIAGNOSIS — F419 Anxiety disorder, unspecified: Secondary | ICD-10-CM

## 2018-06-29 DIAGNOSIS — Z7689 Persons encountering health services in other specified circumstances: Secondary | ICD-10-CM

## 2018-06-29 DIAGNOSIS — I10 Essential (primary) hypertension: Secondary | ICD-10-CM

## 2018-06-29 MED ORDER — ALBUTEROL SULFATE HFA 108 (90 BASE) MCG/ACT IN AERS: 2.0000 | INHALATION_SPRAY | Freq: Four times a day (QID) | RESPIRATORY_TRACT | 0 refills | Status: DC | PRN

## 2018-06-29 MED ORDER — LISINOPRIL 40 MG PO TABS: 40.0000 mg | ORAL_TABLET | Freq: Every day | ORAL | 0 refills | Status: DC

## 2018-06-29 NOTE — Progress Notes (Signed)
BP (!) 150/98 (BP Location: Right Arm, Patient Position: Sitting, Cuff Size: Large)   Pulse 97   Temp (!) 97 F (36.1 C)   Ht 5\' 5"  (1.651 m)   Wt (!) 309 lb 8 oz (140.4 kg)   SpO2 98%   BMI 51.50 kg/m    Subjective:    Patient ID: Leah Bright, female    DOB: 1991-02-25, 27 y.o.   MRN: [id number]  HPI: Leah Bright is a 27 y.o. female presenting on 06/29/2018 for New Patient (Initial Visit)   HPI   Pt moved recently to [facility] into her sister-in-laws basement with her husband.  They have her husband's 2 children every other week and she drives the children to [place] every day for school (every other week)  Pt is not taking her lisinopril every day.  She is "trying to make it stretch".   Pt has been on thyroid medication but she ran out a few weeks ago.   She got her rx at [facility] in [address] in [place].  She says she thinks she was on the wrong strength because she had told a previous provider the wrong strength.   Pt is s/p lap band but she had it surgically removed her senior year in college.  She worked as a Lawyer.  She isn't working now due to having problems with her step-children that she has every other week and the kids go to school in [employer] [place] so she drives them to school and back which is x 4 every day every other week.   Pt has a lot of issues with anxiety and has problems with going out in public.   Relevant past medical, surgical, family and social history reviewed and updated as indicated. Interim medical history since our last visit reviewed. Allergies and medications reviewed and updated.   Current Outpatient Medications:  .  albuterol (PROVENTIL HFA;VENTOLIN HFA) 108 (90 Base) MCG/ACT inhaler, Inhale 2 puffs into the lungs every 6 (six) hours as needed for wheezing or shortness of breath., Disp: , Rfl:  .  busPIRone (BUSPAR) 15 MG tablet, Take 0.5 tablets (7.5 mg total) by mouth 2 (two) times daily., Disp: 30  tablet, Rfl: 1 .  fluticasone (FLONASE SENSIMIST) 27.5 MCG/SPRAY nasal spray, Place 2 sprays into the nose daily as needed. , Disp: , Rfl:  .  lisinopril (PRINIVIL,ZESTRIL) 40 MG tablet, Take 1 tablet (40 mg total) by mouth daily., Disp: 30 tablet, Rfl: 2 .  traZODone (DESYREL) 100 MG tablet, Take 50-100 mg by mouth at bedtime., Disp: , Rfl:  .  venlafaxine XR (EFFEXOR-XR) 150 MG 24 hr capsule, Take 1 capsule (150 mg total) by mouth daily., Disp: 30 capsule, Rfl: 1  Review of Systems  Constitutional: Negative for appetite change, chills, diaphoresis, fatigue, fever and unexpected weight change.  HENT: Negative for congestion, dental problem, drooling, ear pain, facial swelling, hearing loss, mouth sores, sneezing, sore throat, trouble swallowing and voice change.   Eyes: Negative for pain, discharge, redness, itching and visual disturbance.  Respiratory: Negative for cough, choking, shortness of breath and wheezing.   Cardiovascular: Negative for chest pain, palpitations and leg swelling.  Gastrointestinal: Negative for abdominal pain, blood in stool, constipation, diarrhea and vomiting.  Endocrine: Negative for cold intolerance, heat intolerance and polydipsia.  Genitourinary: Negative for decreased urine volume, dysuria and hematuria.  Musculoskeletal: Negative for arthralgias, back pain and gait problem.  Skin: Negative for rash.  Allergic/Immunologic: Negative for environmental  allergies.  Neurological: Positive for headaches. Negative for seizures, syncope and light-headedness.  Hematological: Negative for adenopathy.  Psychiatric/Behavioral: Positive for agitation and dysphoric mood. Negative for suicidal ideas. The patient is nervous/anxious.     Per HPI unless specifically indicated above     Objective:    BP (!) 150/98 (BP Location: Right Arm, Patient Position: Sitting, Cuff Size: Large)   Pulse 97   Temp (!) 97 F (36.1 C)   Ht 5\' 5"  (1.651 m)   Wt (!) 309 lb 8 oz (140.4  kg)   SpO2 98%   BMI 51.50 kg/m   Wt Readings from Last 3 Encounters:  06/29/18 (!) 309 lb 8 oz (140.4 kg)  06/11/18 294 lb (133.4 kg)  05/28/18 297 lb (134.7 kg)    Physical Exam  Constitutional: She is oriented to person, place, and time. She appears well-developed and well-nourished.  HENT:  Head: Normocephalic and atraumatic.  Mouth/Throat: Oropharynx is clear and moist. No oropharyngeal exudate.  Eyes: Pupils are equal, round, and reactive to light. Conjunctivae and EOM are normal.  Neck: Neck supple. No thyromegaly present.  Cardiovascular: Normal rate and regular rhythm.  Pulmonary/Chest: Effort normal and breath sounds normal.  Abdominal: Soft. Bowel sounds are normal. She exhibits no mass. There is no hepatosplenomegaly. There is no tenderness.  Musculoskeletal: She exhibits no edema.  Lymphadenopathy:    She has no cervical adenopathy.  Neurological: She is alert and oriented to person, place, and time. Gait normal.  Skin: Skin is warm and dry.  Psychiatric: Her speech is normal and behavior is normal. Her mood appears anxious. Cognition and memory are normal.  Vitals reviewed.       Assessment & Plan:    Encounter Diagnoses  Name Primary?  . Encounter to establish care Yes  . Essential hypertension   . Hypothyroidism, unspecified type   . Elevated liver enzymes   . Anxiety   . Screening cholesterol level   . Morbid obesity (HCC)     -pt to go to [facility] for [facility] -Counseled pt to get on birth control.  Discussed risks with her current medication and her not using contraception.  She says that she will get on contraception.   Gave pt contact information for [facility] -Will discuss referral to RD at next OV.  She says she might like that but is having too much anxiety right now -pt counseled to Take lisinopril every day -will get Baseline labs -pt signed up for medassist -pt to follow up 1 month.  RTO sooner prn

## 2018-07-05 ENCOUNTER — Other Ambulatory Visit (HOSPITAL_COMMUNITY)
Admission: RE | Admit: 2018-07-05 | Discharge: 2018-07-05 | Disposition: A | Discharge status: Home / Self Care | Payer: Self-pay | Source: Ambulatory Visit | Attending: Physician Assistant | Admitting: Physician Assistant

## 2018-07-05 DIAGNOSIS — Z13220 Encounter for screening for lipoid disorders: Secondary | ICD-10-CM

## 2018-07-05 DIAGNOSIS — I10 Essential (primary) hypertension: Secondary | ICD-10-CM

## 2018-07-05 DIAGNOSIS — R748 Abnormal levels of other serum enzymes: Secondary | ICD-10-CM

## 2018-07-05 DIAGNOSIS — E039 Hypothyroidism, unspecified: Secondary | ICD-10-CM

## 2018-07-05 DIAGNOSIS — Z131 Encounter for screening for diabetes mellitus: Secondary | ICD-10-CM

## 2018-07-05 LAB — COMPREHENSIVE METABOLIC PANEL
ALT: 48 U/L — AB (ref 0–44)
ANION GAP: 8 (ref 5–15)
AST: 34 U/L (ref 15–41)
Albumin: 3.7 g/dL (ref 3.5–5.0)
Alkaline Phosphatase: 76 U/L (ref 38–126)
BILIRUBIN TOTAL: 0.5 mg/dL (ref 0.3–1.2)
BUN: 14 mg/dL (ref 6–20)
CALCIUM: 8.9 mg/dL (ref 8.9–10.3)
CO2: 22 mmol/L (ref 22–32)
Chloride: 107 mmol/L (ref 98–111)
Creatinine, Ser: 0.72 mg/dL (ref 0.44–1.00)
GFR calc Af Amer: >60 mL/min (ref >60)
Glucose, Bld: 189 mg/dL — ABNORMAL HIGH (ref 70–99)
Potassium: 4.1 mmol/L (ref 3.5–5.1)
Sodium: 137 mmol/L (ref 135–145)
TOTAL PROTEIN: 6.5 g/dL (ref 6.5–8.1)

## 2018-07-05 LAB — TSH: TSH: 12.112 u[IU]/mL — ABNORMAL HIGH (ref 0.350–4.500)

## 2018-07-05 LAB — LIPID PANEL
Cholesterol: 171 mg/dL (ref 0–200)
HDL: 44 mg/dL (ref >40)
LDL CALC: 104 mg/dL — AB (ref 0–99)
Total CHOL/HDL Ratio: 3.9 RATIO
Triglycerides: 116 mg/dL (ref <150)
VLDL: 23 mg/dL (ref 0–40)

## 2018-07-05 LAB — HEMOGLOBIN A1C
Hgb A1c MFr Bld: 7.8 % — ABNORMAL HIGH (ref 4.8–5.6)
Mean Plasma Glucose: 177.16 mg/dL

## 2018-07-07 ENCOUNTER — Other Ambulatory Visit: Payer: Self-pay | Admitting: Physician Assistant

## 2018-08-02 ENCOUNTER — Encounter: Payer: Self-pay | Admitting: Physician Assistant

## 2018-08-02 ENCOUNTER — Ambulatory Visit: Payer: Self-pay | Admitting: Physician Assistant

## 2018-08-02 VITALS — BP 136/80 | HR 100 | Temp 98.1°F | Ht 65.0 in | Wt 306.5 lb

## 2018-08-02 DIAGNOSIS — E119 Type 2 diabetes mellitus without complications: Secondary | ICD-10-CM | POA: Insufficient documentation

## 2018-08-02 DIAGNOSIS — I10 Essential (primary) hypertension: Secondary | ICD-10-CM | POA: Insufficient documentation

## 2018-08-02 DIAGNOSIS — E039 Hypothyroidism, unspecified: Secondary | ICD-10-CM

## 2018-08-02 DIAGNOSIS — F39 Unspecified mood [affective] disorder: Secondary | ICD-10-CM

## 2018-08-02 DIAGNOSIS — E785 Hyperlipidemia, unspecified: Secondary | ICD-10-CM

## 2018-08-02 DIAGNOSIS — J069 Acute upper respiratory infection, unspecified: Secondary | ICD-10-CM

## 2018-08-02 MED ORDER — LEVOTHYROXINE SODIUM 50 MCG PO TABS: 50.0000 ug | ORAL_TABLET | Freq: Every day | ORAL | 3 refills | Status: DC

## 2018-08-02 MED ORDER — METFORMIN HCL 1000 MG PO TABS: 1000.0000 mg | ORAL_TABLET | Freq: Two times a day (BID) | ORAL | 3 refills | Status: DC

## 2018-08-02 MED ORDER — AZITHROMYCIN 250 MG PO TABS: ORAL_TABLET | ORAL | 0 refills | Status: DC

## 2018-08-02 NOTE — Patient Instructions (Addendum)
Cholesterol Cholesterol is a white, waxy, fat-like substance that is needed by the human body in small amounts. The liver makes all the cholesterol we need. Cholesterol is carried from the liver by the blood through the blood vessels. Deposits of cholesterol (plaques) may build up on blood vessel (artery) walls. Plaques make the arteries narrower and stiffer. Cholesterol plaques increase the risk for heart attack and stroke. You cannot feel your cholesterol level even if it is very high. The only way to know that it is high is to have a blood test. Once you know your cholesterol levels, you should keep a record of the test results. Work with your health care provider to keep your levels in the desired range. What do the results mean?  Total cholesterol is a rough measure of all the cholesterol in your blood.  LDL (low-density lipoprotein) is the "bad" cholesterol. This is the type that causes plaque to build up on the artery walls. You want this level to be low.  HDL (high-density lipoprotein) is the "good" cholesterol because it cleans the arteries and carries the LDL away. You want this level to be high.  Triglycerides are fat that the body can either burn for energy or store. High levels are closely linked to heart disease. What are the desired levels of cholesterol?  Total cholesterol below 200.  LDL below 100 for people who are at risk, below 70 for people at very high risk.  HDL above 40 is good. A level of 60 or higher is considered to be protective against heart disease.  Triglycerides below 150. How can I lower my cholesterol? Diet Follow your diet program as told by your health care provider.  Choose fish or white meat chicken and [place], roasted or baked. Limit fatty cuts of red meat, fried foods, and processed meats, such as sausage and lunch meats.  Eat lots of fresh fruits and vegetables.  Choose whole grains, beans, pasta, potatoes, and cereals.  Choose olive oil, corn  oil, or canola oil, and use only small amounts.  Avoid butter, mayonnaise, shortening, or palm kernel oils.  Avoid foods with trans fats.  Drink skim or nonfat milk and eat low-fat or nonfat yogurt and cheeses. Avoid whole milk, cream, ice cream, egg yolks, and full-fat cheeses.  Healthier desserts include angel food cake, ginger snaps, animal crackers, hard candy, popsicles, and low-fat or nonfat frozen yogurt. Avoid pastries, cakes, pies, and cookies.  Exercise  Follow your exercise program as told by your health care provider. A regular program: ? Helps to decrease LDL and raise HDL. ? Helps with weight control.  Do things that increase your activity level, such as gardening, walking, and taking the stairs.  Ask your health care provider about ways that you can be more active in your daily life.  Medicine  Take over-the-counter and prescription medicines only as told by your health care provider. ? Medicine may be prescribed by your health care provider to help lower cholesterol and decrease the risk for heart disease. This is usually done if diet and exercise have failed to bring down cholesterol levels. ? If you have several risk factors, you may need medicine even if your levels are normal.  This information is not intended to replace advice given to you by your health care provider. Make sure you discuss any questions you have with your health care provider. Document Released: 05/13/2001 Document Revised: 03/15/2016 Document Reviewed: 02/16/2016 Elsevier Interactive Patient Education  [employer].  Type 2 Diabetes Mellitus, Diagnosis, Adult Type 2 diabetes (type 2 diabetes mellitus) is a long-term (chronic) disease. In type 2 diabetes, one or both of these problems may be present:  The pancreas does not make enough of a hormone called insulin.  Cells in the body do not respond properly to insulin that the body makes (insulin resistance).  Normally, insulin  allows blood sugar (glucose) to enter cells in the body. The cells use glucose for energy. Insulin resistance or lack of insulin causes excess glucose to build up in the blood instead of going into cells. As a result, high blood glucose (hyperglycemia) develops. What increases the risk? The following factors may make you more likely to develop type 2 diabetes:  Having a family member with type 2 diabetes.  Being overweight or obese.  Having an inactive (sedentary) lifestyle.  Having been diagnosed with insulin resistance.  Having a history of prediabetes, gestational diabetes, or polycystic ovarian syndrome (PCOS).  Being of American-Indian, African-American, Hispanic/Latino, or Asian/Pacific Islander descent.  What are the signs or symptoms? In the early stage of this condition, you may not have symptoms. Symptoms develop slowly and may include:  Increased thirst (polydipsia).  Increased hunger(polyphagia).  Increased urination (polyuria).  Increased urination during the night (nocturia).  Unexplained weight loss.  Frequent infections that keep coming back (recurring).  Fatigue.  Weakness.  Vision changes, such as blurry vision.  Cuts or bruises that are slow to heal.  Tingling or numbness in the hands or feet.  Dark patches on the skin (acanthosis nigricans).  How is this diagnosed?  This condition is diagnosed based on your symptoms, your medical history, a physical exam, and your blood glucose level. Your blood glucose may be checked with one or more of the following blood tests:  A fasting blood glucose (FBG) test. You will not be allowed to eat (you will fast) for at least 8 hours before a blood sample is taken.  A random blood glucose test. This checks blood glucose at any time of day regardless of when you ate.  An A1c (hemoglobin A1c) blood test. This provides information about blood glucose control over the previous 2-3 months.  An oral glucose  tolerance test (OGTT). This measures your blood glucose at two times: ? After fasting. This is your baseline blood glucose level. ? Two hours after drinking a beverage that contains glucose.  You may be diagnosed with type 2 diabetes if:  Your FBG level is 126 mg/dL (7.0 mmol/L) or higher.  Your random blood glucose level is 200 mg/dL (16.1 mmol/L) or higher.  Your A1c level is 6.5% or higher.  Your OGGT result is higher than 200 mg/dL (09.6 mmol/L).  These blood tests may be repeated to confirm your diagnosis. How is this treated?  Your treatment may be managed by a specialist called an endocrinologist. Type 2 diabetes may be treated by following instructions from your health care provider about:  Making diet and lifestyle changes. This may include: ? Following an individualized nutrition plan that is developed by a diet and nutrition specialist (registered dietitian). ? Exercising regularly. ? Finding ways to manage stress.  Checking your blood glucose level as often as directed.  Taking diabetes medicines or insulin daily. This helps to keep your blood glucose levels in the healthy range. ? If you use insulin, you may need to adjust the dosage depending on how physically active you are and what foods you eat. Your health care provider will tell you  how to adjust your dosage.  Taking medicines to help prevent complications from diabetes, such as: ? Aspirin. ? Medicine to lower cholesterol. ? Medicine to control blood pressure.  Your health care provider will set individualized treatment goals for you. Your goals will be based on your age, other medical conditions you have, and how you respond to diabetes treatment. Generally, the goal of treatment is to maintain the following blood glucose levels:  Before meals (preprandial): 80-130 mg/dL (4.7-8.24.4-7.2 mmol/L).  After meals (postprandial): below 180 mg/dL (10 mmol/L).  A1c level: less than 7%.  Follow these instructions at  home: Questions to Ask Your Health Care Provider Consider asking the following questions:  Do I need to meet with a diabetes educator?  Where can I find a support group for people with diabetes?  What equipment will I need to manage my diabetes at home?  What diabetes medicines do I need, and when should I take them?  How often do I need to check my blood glucose?  What number can I call if I have questions?  When is my next appointment?  General instructions  Take over-the-counter and prescription medicines only as told by your health care provider.  Keep all follow-up visits as told by your health care provider. This is important.  For more information about diabetes, visit: ? American Diabetes Association (ADA): www.diabetes.org ? American Association of Diabetes Educators (AADE): www.diabeteseducator.org/patient-resources Contact a health care provider if:  Your blood glucose is at or above 240 mg/dL (95.613.3 mmol/L) for 2 days in a row.  You have been sick or have had a fever for 2 days or longer and you are not getting better.  You have any of the following problems for more than 6 hours: ? You cannot eat or drink. ? You have nausea and vomiting. ? You have diarrhea. Get help right away if:  Your blood glucose is lower than 54 mg/dL (3.0 mmol/L).  You become confused or you have trouble thinking clearly.  You have difficulty breathing.  You have moderate or large ketone levels in your urine. This information is not intended to replace advice given to you by your health care provider. Make sure you discuss any questions you have with your health care provider. Document Released: 08/18/2005 Document Revised: 01/24/2016 Document Reviewed: 09/21/2015 Elsevier Interactive Patient Education  [employer]2018 [employer]

## 2018-08-02 NOTE — Progress Notes (Signed)
BP 136/80 (BP Location: Left Arm, Patient Position: Sitting, Cuff Size: Large)   Pulse 100   Temp 98.1 F (36.7 C)   Ht 5\' 5"  (1.651 m)   Wt (!) 306 lb 8 oz (139 kg)   SpO2 97%   BMI 51.00 kg/m    Subjective:    Patient ID: Leah Bright, female    DOB: 1990-09-08, 27 y.o.   MRN: [id number]  HPI: Leah Bright is a 27 y.o. female presenting on 08/02/2018 for Cough (runny nose, wheezing, hoarseness, HA, chest pain when breathing, sore throat, diarrhea, body aches. pt has taken mucinex dm, pseudofed, nyquil, and cough drops. pt states OTC meds mostly not helpful. sx began Tuesday 07-25-2018)   HPI  Chief Complaint  Patient presents with  . Cough    runny nose, wheezing, hoarseness, HA, chest pain when breathing, sore throat, diarrhea, body aches. pt has taken mucinex dm, pseudofed, nyquil, and cough drops. pt states OTC meds mostly not helpful. sx began Tuesday 07-25-2018    Relevant past medical, surgical, family and social history reviewed and updated as indicated. Interim medical history since our last visit reviewed. Allergies and medications reviewed and updated.   Current Outpatient Medications:  .  albuterol (PROVENTIL HFA;VENTOLIN HFA) 108 (90 Base) MCG/ACT inhaler, Inhale 2 puffs into the lungs every 6 (six) hours as needed for wheezing or shortness of breath., Disp: 3 Inhaler, Rfl: 0 .  busPIRone (BUSPAR) 15 MG tablet, Take 0.5 tablets (7.5 mg total) by mouth 2 (two) times daily., Disp: 30 tablet, Rfl: 1 .  fluticasone (FLONASE SENSIMIST) 27.5 MCG/SPRAY nasal spray, Place 2 sprays into the nose daily as needed. , Disp: , Rfl:  .  lisinopril (PRINIVIL,ZESTRIL) 40 MG tablet, Take 1 tablet (40 mg total) by mouth daily., Disp: 90 tablet, Rfl: 0 .  traZODone (DESYREL) 100 MG tablet, Take 50-100 mg by mouth at bedtime as needed. , Disp: , Rfl:  .  venlafaxine XR (EFFEXOR-XR) 150 MG 24 hr capsule, Take 1 capsule (150 mg total) by mouth daily., Disp: 30 capsule, Rfl:  1   Review of Systems  Constitutional: Positive for chills and fatigue. Negative for appetite change, diaphoresis, fever and unexpected weight change.  HENT: Positive for congestion, sneezing, sore throat and trouble swallowing. Negative for dental problem, drooling, ear pain, facial swelling, hearing loss, mouth sores and voice change.   Eyes: Negative for pain, discharge, redness, itching and visual disturbance.  Respiratory: Positive for cough and wheezing. Negative for choking and shortness of breath.   Cardiovascular: Positive for chest pain. Negative for palpitations and leg swelling.  Gastrointestinal: Positive for diarrhea. Negative for abdominal pain, blood in stool, constipation and vomiting.  Endocrine: Negative for cold intolerance, heat intolerance and polydipsia.  Genitourinary: Negative for decreased urine volume, dysuria and hematuria.  Musculoskeletal: Negative for arthralgias, back pain and gait problem.  Skin: Negative for rash.  Allergic/Immunologic: Negative for environmental allergies.  Neurological: Negative for seizures, syncope, light-headedness and headaches.  Hematological: Negative for adenopathy.  Psychiatric/Behavioral: Positive for agitation and dysphoric mood. Negative for suicidal ideas. The patient is nervous/anxious.     Per HPI unless specifically indicated above     Objective:    BP 136/80 (BP Location: Left Arm, Patient Position: Sitting, Cuff Size: Large)   Pulse 100   Temp 98.1 F (36.7 C)   Ht 5\' 5"  (1.651 m)   Wt (!) 306 lb 8 oz (139 kg)   SpO2 97%   BMI 51.00 kg/m  Wt Readings from Last 3 Encounters:  08/02/18 (!) 306 lb 8 oz (139 kg)  06/29/18 (!) 309 lb 8 oz (140.4 kg)  06/11/18 294 lb (133.4 kg)    Physical Exam  Constitutional: She is oriented to person, place, and time. She appears well-developed and well-nourished.  HENT:  Head: Normocephalic and atraumatic.  Right Ear: Hearing, tympanic membrane, external ear and ear canal  normal.  Left Ear: Hearing, tympanic membrane, external ear and ear canal normal.  Nose: Nose normal.  Mouth/Throat: Uvula is midline and oropharynx is clear and moist. No oropharyngeal exudate.  Neck: Neck supple.  Cardiovascular: Normal rate and regular rhythm.  Pulmonary/Chest: Effort normal and breath sounds normal. She has no wheezes.  Lymphadenopathy:    She has no cervical adenopathy.  Neurological: She is alert and oriented to person, place, and time.  Skin: Skin is warm and dry.  Psychiatric: She has a normal mood and affect. Her behavior is normal.  Vitals reviewed.   Results for orders placed or performed during the hospital encounter of 07/05/18  Hemoglobin A1c  Result Value Ref Range   Hgb A1c MFr Bld 7.8 (H) 4.8 - 5.6 %   Mean Plasma Glucose 177.16 mg/dL  Comprehensive metabolic panel  Result Value Ref Range   Sodium 137 135 - 145 mmol/L   Potassium 4.1 3.5 - 5.1 mmol/L   Chloride 107 98 - 111 mmol/L   CO2 22 22 - 32 mmol/L   Glucose, Bld 189 (H) 70 - 99 mg/dL   BUN 14 6 - 20 mg/dL   Creatinine, Ser 8.110.72 0.44 - 1.00 mg/dL   Calcium 8.9 8.9 - 91.410.3 mg/dL   Total Protein 6.5 6.5 - 8.1 g/dL   Albumin 3.7 3.5 - 5.0 g/dL   AST 34 15 - 41 U/L   ALT 48 (H) 0 - 44 U/L   Alkaline Phosphatase 76 38 - 126 U/L   Total Bilirubin 0.5 0.3 - 1.2 mg/dL   GFR calc non Af Amer >60 >60 mL/min   GFR calc Af Amer >60 >60 mL/min   Anion gap 8 5 - 15  Lipid panel  Result Value Ref Range   Cholesterol 171 0 - 200 mg/dL   Triglycerides 782116 <956<150 mg/dL   HDL 44 >21>40 mg/dL   Total CHOL/HDL Ratio 3.9 RATIO   VLDL 23 0 - 40 mg/dL   LDL Cholesterol 308104 (H) 0 - 99 mg/dL  TSH  Result Value Ref Range   TSH 12.112 (H) 0.350 - 4.500 uIU/mL      Assessment & Plan:   Encounter Diagnoses  Name Primary?  [name]. Upper respiratory tract infection, unspecified type Yes  . Type 2 diabetes mellitus without complication, without long-term current use of insulin (HCC)   . Morbid obesity (HCC)   .  Essential hypertension   . Hypothyroidism, unspecified type   . Hyperlipidemia, unspecified hyperlipidemia type   . Mood disorder (HCC)     -reviewed labs with pt -rx zpack  -rx Levothyroxine for hypothyroidism -counseled pt on DM diagnosis.  rx metformin and counseled on DM diet.   -continue lisinopril for blood pressure -counseled on lowfat diet and exercise for lipids.  Consider statin  -[facility] -pt to follow up 3 months.  RTO sooner prn

## 2018-08-03 ENCOUNTER — Ambulatory Visit: Payer: Self-pay | Admitting: Physician Assistant

## 2018-08-09 ENCOUNTER — Other Ambulatory Visit: Payer: Self-pay | Admitting: Physician Assistant

## 2018-08-09 ENCOUNTER — Telehealth: Payer: Self-pay | Admitting: Student

## 2018-08-09 MED ORDER — METFORMIN HCL 500 MG PO TABS: 500.0000 mg | ORAL_TABLET | Freq: Two times a day (BID) | ORAL | 3 refills | Status: DC

## 2018-08-09 NOTE — Telephone Encounter (Signed)
LPN called pt back regarding voicemail. Pt states she has been cutting her metformin 1000mg  in half and taking half (500mg ) in the morning and half (500mg ) in the afternoon. Pt states that seems to be helping with her diarrhea. Pt states FBS have been running between 150-170.  PA advises that it is ok for pt to keep taking metformin 500 mg bid. PA sent a new rx for 500mg  to pt's preferred pharmacy - [facility] in [place]. Pt verbalized understanding to continue 500 mg bid and that new rx has been sent to [facility] in [place].

## 2018-10-26 ENCOUNTER — Other Ambulatory Visit (HOSPITAL_COMMUNITY)
Admission: RE | Admit: 2018-10-26 | Discharge: 2018-10-26 | Disposition: A | Discharge status: Home / Self Care | Payer: Self-pay | Source: Ambulatory Visit | Attending: Physician Assistant | Admitting: Physician Assistant

## 2018-10-26 DIAGNOSIS — E119 Type 2 diabetes mellitus without complications: Secondary | ICD-10-CM | POA: Insufficient documentation

## 2018-10-26 DIAGNOSIS — I10 Essential (primary) hypertension: Secondary | ICD-10-CM | POA: Insufficient documentation

## 2018-10-26 DIAGNOSIS — E039 Hypothyroidism, unspecified: Secondary | ICD-10-CM | POA: Insufficient documentation

## 2018-10-26 DIAGNOSIS — E785 Hyperlipidemia, unspecified: Secondary | ICD-10-CM | POA: Insufficient documentation

## 2018-10-26 LAB — COMPREHENSIVE METABOLIC PANEL
ALBUMIN: 4.3 g/dL (ref 3.5–5.0)
ALT: 49 U/L — ABNORMAL HIGH (ref 0–44)
AST: 38 U/L (ref 15–41)
Alkaline Phosphatase: 86 U/L (ref 38–126)
Anion gap: 10 (ref 5–15)
BILIRUBIN TOTAL: 0.7 mg/dL (ref 0.3–1.2)
BUN: 13 mg/dL (ref 6–20)
CO2: 25 mmol/L (ref 22–32)
Calcium: 9.3 mg/dL (ref 8.9–10.3)
Chloride: 105 mmol/L (ref 98–111)
Creatinine, Ser: 0.73 mg/dL (ref 0.44–1.00)
GFR calc non Af Amer: >60 mL/min (ref >60)
GLUCOSE: 135 mg/dL — AB (ref 70–99)
POTASSIUM: 3.9 mmol/L (ref 3.5–5.1)
SODIUM: 140 mmol/L (ref 135–145)
TOTAL PROTEIN: 7.5 g/dL (ref 6.5–8.1)

## 2018-10-26 LAB — LIPID PANEL
CHOL/HDL RATIO: 3.9 ratio
Cholesterol: 190 mg/dL (ref 0–200)
HDL: 49 mg/dL (ref >40)
LDL Cholesterol: 119 mg/dL — ABNORMAL HIGH (ref 0–99)
TRIGLYCERIDES: 110 mg/dL (ref <150)
VLDL: 22 mg/dL (ref 0–40)

## 2018-10-26 LAB — TSH: TSH: 5.717 u[IU]/mL — ABNORMAL HIGH (ref 0.350–4.500)

## 2018-10-26 LAB — HEMOGLOBIN A1C
HEMOGLOBIN A1C: 8.5 % — AB (ref 4.8–5.6)
Mean Plasma Glucose: 197.25 mg/dL

## 2018-10-27 LAB — MICROALBUMIN, URINE: MICROALB UR: 183.4 ug/mL — AB

## 2018-11-01 ENCOUNTER — Ambulatory Visit: Payer: Self-pay | Admitting: Physician Assistant

## 2018-11-02 ENCOUNTER — Encounter: Payer: Self-pay | Admitting: Physician Assistant

## 2018-11-02 ENCOUNTER — Ambulatory Visit: Payer: Self-pay | Admitting: Physician Assistant

## 2018-11-02 VITALS — BP 128/86 | HR 93 | Ht 65.0 in | Wt 306.5 lb

## 2018-11-02 DIAGNOSIS — E039 Hypothyroidism, unspecified: Secondary | ICD-10-CM

## 2018-11-02 DIAGNOSIS — H6122 Impacted cerumen, left ear: Secondary | ICD-10-CM

## 2018-11-02 DIAGNOSIS — I10 Essential (primary) hypertension: Secondary | ICD-10-CM

## 2018-11-02 DIAGNOSIS — E785 Hyperlipidemia, unspecified: Secondary | ICD-10-CM

## 2018-11-02 DIAGNOSIS — E119 Type 2 diabetes mellitus without complications: Secondary | ICD-10-CM

## 2018-11-02 DIAGNOSIS — R748 Abnormal levels of other serum enzymes: Secondary | ICD-10-CM

## 2018-11-02 DIAGNOSIS — F39 Unspecified mood [affective] disorder: Secondary | ICD-10-CM

## 2018-11-02 MED ORDER — LISINOPRIL 40 MG PO TABS: 40.0000 mg | ORAL_TABLET | Freq: Every day | ORAL | 0 refills | Status: DC

## 2018-11-02 MED ORDER — ALBUTEROL SULFATE HFA 108 (90 BASE) MCG/ACT IN AERS: 2.0000 | INHALATION_SPRAY | Freq: Four times a day (QID) | RESPIRATORY_TRACT | 0 refills | Status: DC | PRN

## 2018-11-02 MED ORDER — LEVOTHYROXINE SODIUM 50 MCG PO TABS: 50.0000 ug | ORAL_TABLET | Freq: Every day | ORAL | 0 refills | Status: DC

## 2018-11-02 MED ORDER — SITAGLIPTIN PHOSPHATE 100 MG PO TABS: 100.0000 mg | ORAL_TABLET | Freq: Every day | ORAL | 1 refills | Status: DC

## 2018-11-02 MED ORDER — METFORMIN HCL 500 MG PO TABS: 500.0000 mg | ORAL_TABLET | Freq: Two times a day (BID) | ORAL | 0 refills | Status: DC

## 2018-11-02 NOTE — Progress Notes (Signed)
BP 128/86 (BP Location: Left Arm, Patient Position: Sitting, Cuff Size: Large)   Pulse 93   Ht  (1.651 m)   Wt (!) 306 lb 8 oz (139 kg)   SpO2 97%   BMI 51.00 kg/m    Subjective:    Patient ID: Leah Bright, female    DOB: 03-31-1991, 28 y.o.   MRN: [id number]  HPI: Leah Bright is a 28 y.o. female presenting on 11/02/2018 for Diabetes; Hypertension; and Hypothyroidism   HPI   Pt is going to [place] for MH issues  She denies any new issues today.  Relevant past medical, surgical, family and social history reviewed and updated as indicated. Interim medical history since our last visit reviewed. Allergies and medications reviewed and updated.    Current Outpatient Medications:  .  albuterol (PROVENTIL HFA;VENTOLIN HFA) 108 (90 Base) MCG/ACT inhaler, Inhale 2 puffs into the lungs every 6 (six) hours as needed for wheezing or shortness of breath., Disp: 3 Inhaler, Rfl: 0 .  busPIRone (BUSPAR) 15 MG tablet, Take 0.5 tablets (7.5 mg total) by mouth 2 (two) times daily., Disp: 30 tablet, Rfl: 1 .  fluticasone (FLONASE SENSIMIST) 27.5 MCG/SPRAY nasal spray, Place 2 sprays into the nose daily as needed. , Disp: , Rfl:  .  levothyroxine (SYNTHROID, LEVOTHROID) 50 MCG tablet, Take 1 tablet (50 mcg total) by mouth daily., Disp: 30 tablet, Rfl: 3 .  lisinopril (PRINIVIL,ZESTRIL) 40 MG tablet, Take 1 tablet (40 mg total) by mouth daily., Disp: 90 tablet, Rfl: 0 .  metFORMIN (GLUCOPHAGE) 500 MG tablet, Take 1 tablet (500 mg total) by mouth 2 (two) times daily with a meal., Disp: 60 tablet, Rfl: 3 .  traZODone (DESYREL) 100 MG tablet, Take 50-100 mg by mouth at bedtime as needed. , Disp: , Rfl:  .  venlafaxine XR (EFFEXOR-XR) 150 MG 24 hr capsule, Take 1 capsule (150 mg total) by mouth daily., Disp: 30 capsule, Rfl: 1     Review of Systems  Constitutional: Positive for appetite change. Negative for chills, diaphoresis, fatigue, fever and unexpected weight change.  HENT: Positive  for ear pain. Negative for congestion, drooling, facial swelling, hearing loss, mouth sores, sneezing, sore throat, trouble swallowing and voice change.   Eyes: Negative for pain, discharge, redness, itching and visual disturbance.  Respiratory: Negative for cough, choking, shortness of breath and wheezing.   Cardiovascular: Negative for chest pain, palpitations and leg swelling.  Gastrointestinal: Negative for abdominal pain, blood in stool, constipation, diarrhea and vomiting.  Endocrine: Negative for cold intolerance, heat intolerance and polydipsia.  Genitourinary: Negative for decreased urine volume, dysuria and hematuria.  Musculoskeletal: Negative for arthralgias, back pain and gait problem.  Skin: Negative for rash.  Allergic/Immunologic: Negative for environmental allergies.  Neurological: Negative for seizures, syncope, light-headedness and headaches.  Hematological: Negative for adenopathy.  Psychiatric/Behavioral: Positive for agitation and dysphoric mood. Negative for suicidal ideas. The patient is nervous/anxious.     Per HPI unless specifically indicated above     Objective:    BP 128/86 (BP Location: Left Arm, Patient Position: Sitting, Cuff Size: Large)   Pulse 93   Ht  (1.651 m)   Wt (!) 306 lb 8 oz (139 kg)   SpO2 97%   BMI 51.00 kg/m   Wt Readings from Last 3 Encounters:  11/02/18 (!) 306 lb 8 oz (139 kg)  08/02/18 (!) 306 lb 8 oz (139 kg)  06/29/18 (!) 309 lb 8 oz (140.4 kg)  Physical Exam Vitals signs reviewed.  Constitutional:      Appearance: She is well-developed.  HENT:     Head: Normocephalic and atraumatic.     Right Ear: Tympanic membrane normal. There is impacted cerumen.     Left Ear: Tympanic membrane normal. There is impacted cerumen.     Ears:     Comments: TMs clear after lavage Neck:     Musculoskeletal: Neck supple.  Cardiovascular:     Rate and Rhythm: Normal rate and regular rhythm.  Pulmonary:     Effort: Pulmonary effort  is normal.     Breath sounds: Normal breath sounds.  Abdominal:     General: Bowel sounds are normal.     Palpations: Abdomen is soft. There is no mass.     Tenderness: There is no abdominal tenderness.  Lymphadenopathy:     Cervical: No cervical adenopathy.  Skin:    General: Skin is warm and dry.  Neurological:     Mental Status: She is alert and oriented to person, place, and time.  Psychiatric:        Behavior: Behavior normal.     Results for orders placed or performed during the hospital encounter of 10/26/18  Microalbumin, urine  Result Value Ref Range   Microalb, Ur 183.4 (H) Not Estab. ug/mL  Comprehensive metabolic panel  Result Value Ref Range   Sodium 140 135 - 145 mmol/L   Potassium 3.9 3.5 - 5.1 mmol/L   Chloride 105 98 - 111 mmol/L   CO2 25 22 - 32 mmol/L   Glucose, Bld 135 (H) 70 - 99 mg/dL   BUN 13 6 - 20 mg/dL   Creatinine, Ser 0.05 0.44 - 1.00 mg/dL   Calcium 9.3 8.9 - 11.0 mg/dL   Total Protein 7.5 6.5 - 8.1 g/dL   Albumin 4.3 3.5 - 5.0 g/dL   AST 38 15 - 41 U/L   ALT 49 (H) 0 - 44 U/L   Alkaline Phosphatase 86 38 - 126 U/L   Total Bilirubin 0.7 0.3 - 1.2 mg/dL   GFR calc non Af Amer >60 >60 mL/min   GFR calc Af Amer >60 >60 mL/min   Anion gap 10 5 - 15  Lipid panel  Result Value Ref Range   Cholesterol 190 0 - 200 mg/dL   Triglycerides 211 <173 mg/dL   HDL 49 >56 mg/dL   Total CHOL/HDL Ratio 3.9 RATIO   VLDL 22 0 - 40 mg/dL   LDL Cholesterol 701 (H) 0 - 99 mg/dL  TSH  Result Value Ref Range   TSH 5.717 (H) 0.350 - 4.500 uIU/mL  Hemoglobin A1c  Result Value Ref Range   Hgb A1c MFr Bld 8.5 (H) 4.8 - 5.6 %   Mean Plasma Glucose 197.25 mg/dL      Assessment & Plan:    Encounter Diagnoses  Name Primary?  . Type 2 diabetes mellitus without complication, without long-term current use of insulin (HCC) Yes  . Essential hypertension   . Hypothyroidism, unspecified type   . Hyperlipidemia, unspecified hyperlipidemia type   . Mood disorder  (HCC)   . Morbid obesity (HCC)   . Impacted cerumen, left ear   . Elevated liver enzymes     -reviewed labs with pt -Pt wants to watch diet and exercis for lipids and not have medication increased at this time -will Add januvia to get better diabetic control -pt to continue with [facility] for [facility] issues -pt to follow up here  3  months.  RTO sooner prn

## 2018-11-02 NOTE — Patient Instructions (Signed)

## 2019-01-28 ENCOUNTER — Other Ambulatory Visit: Payer: Self-pay

## 2019-01-28 ENCOUNTER — Emergency Department (HOSPITAL_COMMUNITY)
Admission: EM | Admit: 2019-01-28 | Discharge: 2019-01-28 | Disposition: A | Discharge status: Home / Self Care | Payer: Self-pay | Attending: Emergency Medicine | Admitting: Emergency Medicine

## 2019-01-28 ENCOUNTER — Emergency Department (HOSPITAL_COMMUNITY): Payer: Self-pay

## 2019-01-28 ENCOUNTER — Encounter (HOSPITAL_COMMUNITY): Payer: Self-pay | Admitting: Emergency Medicine

## 2019-01-28 DIAGNOSIS — Z79899 Other long term (current) drug therapy: Secondary | ICD-10-CM | POA: Insufficient documentation

## 2019-01-28 DIAGNOSIS — E119 Type 2 diabetes mellitus without complications: Secondary | ICD-10-CM | POA: Insufficient documentation

## 2019-01-28 DIAGNOSIS — J45909 Unspecified asthma, uncomplicated: Secondary | ICD-10-CM | POA: Insufficient documentation

## 2019-01-28 DIAGNOSIS — Y929 Unspecified place or not applicable: Secondary | ICD-10-CM | POA: Insufficient documentation

## 2019-01-28 DIAGNOSIS — W548XXA Other contact with dog, initial encounter: Secondary | ICD-10-CM | POA: Insufficient documentation

## 2019-01-28 DIAGNOSIS — E039 Hypothyroidism, unspecified: Secondary | ICD-10-CM | POA: Insufficient documentation

## 2019-01-28 DIAGNOSIS — I10 Essential (primary) hypertension: Secondary | ICD-10-CM | POA: Insufficient documentation

## 2019-01-28 DIAGNOSIS — S66911A Strain of unspecified muscle, fascia and tendon at wrist and hand level, right hand, initial encounter: Secondary | ICD-10-CM | POA: Insufficient documentation

## 2019-01-28 DIAGNOSIS — Y93K9 Activity, other involving animal care: Secondary | ICD-10-CM | POA: Insufficient documentation

## 2019-01-28 DIAGNOSIS — Y999 Unspecified external cause status: Secondary | ICD-10-CM | POA: Insufficient documentation

## 2019-01-28 DIAGNOSIS — Z7984 Long term (current) use of oral hypoglycemic drugs: Secondary | ICD-10-CM | POA: Insufficient documentation

## 2019-01-28 DIAGNOSIS — IMO0001 Reserved for inherently not codable concepts without codable children: Secondary | ICD-10-CM

## 2019-01-28 NOTE — ED Triage Notes (Signed)
Patient states she was playing with her dog last week and injured her right hand. Patient states hand and fingers are becoming hard to move. Patient does have swelling to the right hand.

## 2019-01-28 NOTE — ED Triage Notes (Signed)
Patient is hypertensive but has a history of hypertension. Patient is non-symptomatic for hypertension. Patient states she is on blood pressure medication but had not had her evening medication.

## 2019-01-28 NOTE — ED Provider Notes (Signed)
[facility] EMERGENCY DEPARTMENT Provider Note   CSN: [id number] Arrival date & time: 01/28/19  2046    History   Chief Complaint Chief Complaint  Patient presents with  . Hand Injury    right hand    HPI Leah Bright is a 28 y.o. female.     HPI   Leah Bright is a 28 y.o. female who presents to the Emergency Department complaining of persistent pain to the right hand including the middle and ring fingers.  She states the symptoms have been present for 1 week and began after a pulling injury that occurred while playing with her dog.  She states that swelling has been persistent since the injury.  She describes increased pain with attempted gripping and movement of her fingers.  Her right wrist is nontender.  She denies abrasions or open wounds of her hand or fingers.  No numbness or weakness.  She is right-hand dominant.  She is applied ice intermittently, but no other therapies.   Past Medical History:  Diagnosis Date  . Agitation   . Anxiety   . Asthma   . Depression   . Hypertension   . Hypothyroidism   . Renal artery stenosis (HCC)   . Social anxiety disorder   . Social anxiety disorder   . Thyroid disease     Patient Active Problem List   Diagnosis Date Noted  . Hypothyroidism 08/02/2018  . Essential hypertension 08/02/2018  . Morbid obesity (HCC) 08/02/2018  . Type 2 diabetes mellitus without complication, without long-term current use of insulin (HCC) 08/02/2018  . 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  . Renal artery stenosis (HCC) 03/02/2018    Past Surgical History:  Procedure Laterality Date  . CHOLECYSTECTOMY    . LAPAROSCOPIC GASTRIC BANDING    . LAPAROSCOPIC REPAIR AND REMOVAL OF GASTRIC BAND    . TONSILLECTOMY    . WISDOM TOOTH EXTRACTION       OB History   No obstetric history on file.      Home Medications    Prior to Admission medications   Medication Sig Start Date End Date Taking? Authorizing Provider   albuterol (PROVENTIL HFA;VENTOLIN HFA) 108 (90 Base) MCG/ACT inhaler Inhale 2 puffs into the lungs every 6 (six) hours as needed for wheezing or shortness of breath. 11/02/18   Jacquelin Hawking, PA-C  busPIRone (BUSPAR) 15 MG tablet Take 0.5 tablets (7.5 mg total) by mouth 2 (two) times daily. 03/02/18   Clapacs, Jackquline Denmark, MD  fluticasone (FLONASE SENSIMIST) 27.5 MCG/SPRAY nasal spray Place 2 sprays into the nose daily as needed.     [provider]  levothyroxine (SYNTHROID, LEVOTHROID) 50 MCG tablet Take 1 tablet (50 mcg total) by mouth daily. 11/02/18   Jacquelin Hawking, PA-C  lisinopril (PRINIVIL,ZESTRIL) 40 MG tablet Take 1 tablet (40 mg total) by mouth daily. 11/02/18   Jacquelin Hawking, PA-C  metFORMIN (GLUCOPHAGE) 500 MG tablet Take 1 tablet (500 mg total) by mouth 2 (two) times daily with a meal. 11/02/18   Jacquelin Hawking, PA-C  sitaGLIPtin (JANUVIA) 100 MG tablet Take 1 tablet (100 mg total) by mouth daily. 11/02/18   Jacquelin Hawking, PA-C  traZODone (DESYREL) 100 MG tablet Take 50-100 mg by mouth at bedtime as needed.     [provider]  venlafaxine XR (EFFEXOR-XR) 150 MG 24 hr capsule Take 1 capsule (150 mg total) by mouth daily. 03/02/18 11/02/18  Clapacs, Jackquline Denmark, MD    Family History Family History  Problem Relation Age of Onset  . Cancer Mother        brain tumor  . Hypertension Mother   . Depression Mother   . Diabetes Mother   . Anxiety disorder Father   . Depression Father   . Hypertension Maternal Grandmother   . Thyroid disease Maternal Grandmother   . Hypertension Maternal Grandfather   . Cancer Maternal Grandfather        myositis    Social History Social History   Tobacco Use  . Smoking status: Never Smoker  . Smokeless tobacco: Never Used  Substance Use Topics  . Alcohol use: Yes    Comment: rarely  . Drug use: No     Allergies   Toradol [ketorolac tromethamine]; Codeine; and Pepto-bismol [bismuth]   Review of Systems Review of Systems   Constitutional: Negative for chills and fever.  Musculoskeletal: Positive for arthralgias (Right hand, middle and ring finger pain and swelling). Negative for neck pain.  Skin: Negative for color change and wound.  Neurological: Negative for weakness and numbness.     Physical Exam Updated Vital Signs BP (!) 186/119 (BP Location: Left Arm)   Pulse 85   Temp 98.8 F (37.1 C) (Oral)   Resp 18   Ht  (1.651 m)   Wt 134.3 kg   LMP 12/29/2018 (Approximate)   SpO2 99%   BMI 49.26 kg/m   Physical Exam Vitals signs and nursing note reviewed.  Constitutional:      General: She is not in acute distress.    Appearance: Normal appearance.  HENT:     Head: Atraumatic.  Cardiovascular:     Rate and Rhythm: Regular rhythm.     Pulses: Normal pulses.  Pulmonary:     Effort: Pulmonary effort is normal.     Breath sounds: Normal breath sounds.  Musculoskeletal:        General: Swelling and signs of injury present.     Right hand: She exhibits tenderness and swelling. She exhibits normal two-point discrimination and normal capillary refill. Normal sensation noted. Normal strength noted.       Hands:     Comments: Tenderness to palpation at the base of the right middle and ring fingers.  Mild swelling is noted.  Right wrist is nontender.  Distal fingers are also nontender.  Skin:    General: Skin is warm.     Capillary Refill: Capillary refill takes less than 2 seconds.     Findings: No erythema or rash.  Neurological:     General: No focal deficit present.     Mental Status: She is alert.     Sensory: No sensory deficit.     Motor: No weakness.      ED Treatments / Results  Labs (all labs ordered are listed, but only abnormal results are displayed) Labs Reviewed - No data to display  EKG None  Radiology Dg Hand Complete Right  Result Date: 01/28/2019 CLINICAL DATA:  Right hand swelling EXAM: RIGHT HAND - COMPLETE 3+ VIEW COMPARISON:  03/15/2018 FINDINGS: There is no  evidence of fracture or dislocation. There is no evidence of arthropathy or other focal bone abnormality. Soft tissues are unremarkable. IMPRESSION: Negative. Electronically Signed   By: Deatra Robinson M.D.   On: 01/28/2019 22:27    Procedures Procedures (including critical care time)  Medications Ordered in ED Medications - No data to display   Initial Impression / Assessment and Plan / ED Course  I have reviewed the triage  vital signs and the nursing notes.  Pertinent labs & imaging results that were available during my care of the patient were reviewed by me and considered in my medical decision making (see chart for details).        Patient with likely strain injury of the right fingers.  Neurovascularly intact.  X-ray negative for fracture or dislocation. Fingers splinted, patient agrees to RICE therapy, ibuprofen for pain  Final Clinical Impressions(s) / ED Diagnoses   Final diagnoses:  Strain of hand and finger, right, initial encounter    ED Discharge Orders    None       Pauline Ausriplett, Shogo Larkey, PA-C 01/28/19 2238    Sabas SousBero, Michael M, MD 02/01/19 [phone]

## 2019-01-28 NOTE — Discharge Instructions (Addendum)
Wear the splint as needed for support to your hand and fingers.  Elevate and apply ice off and on.  Call the orthopedic provider listed to arrange a follow-up appointment in 1 week if not improving.

## 2019-02-02 ENCOUNTER — Ambulatory Visit: Payer: Self-pay | Admitting: Physician Assistant

## 2019-02-04 ENCOUNTER — Encounter (HOSPITAL_COMMUNITY): Payer: Self-pay | Admitting: Emergency Medicine

## 2019-02-04 ENCOUNTER — Other Ambulatory Visit: Payer: Self-pay

## 2019-02-04 ENCOUNTER — Emergency Department (HOSPITAL_COMMUNITY)
Admission: EM | Admit: 2019-02-04 | Discharge: 2019-02-04 | Disposition: A | Discharge status: Home / Self Care | Payer: Self-pay | Attending: Emergency Medicine | Admitting: Emergency Medicine

## 2019-02-04 DIAGNOSIS — Z7984 Long term (current) use of oral hypoglycemic drugs: Secondary | ICD-10-CM | POA: Insufficient documentation

## 2019-02-04 DIAGNOSIS — I10 Essential (primary) hypertension: Secondary | ICD-10-CM | POA: Insufficient documentation

## 2019-02-04 DIAGNOSIS — E119 Type 2 diabetes mellitus without complications: Secondary | ICD-10-CM | POA: Insufficient documentation

## 2019-02-04 DIAGNOSIS — H1013 Acute atopic conjunctivitis, bilateral: Secondary | ICD-10-CM | POA: Insufficient documentation

## 2019-02-04 DIAGNOSIS — E039 Hypothyroidism, unspecified: Secondary | ICD-10-CM | POA: Insufficient documentation

## 2019-02-04 DIAGNOSIS — Z79899 Other long term (current) drug therapy: Secondary | ICD-10-CM | POA: Insufficient documentation

## 2019-02-04 MED ORDER — ERYTHROMYCIN 5 MG/GM OP OINT
TOPICAL_OINTMENT | Freq: Once | OPHTHALMIC | Status: AC
  Administered 2019-02-04: 1 via OPHTHALMIC
  Filled 2019-02-04: qty 3.5

## 2019-02-04 MED ORDER — FLUORESCEIN SODIUM 1 MG OP STRP
1.0000 | ORAL_STRIP | Freq: Once | OPHTHALMIC | Status: DC
  Filled 2019-02-04: qty 1

## 2019-02-04 MED ORDER — TETRACAINE HCL 0.5 % OP SOLN
1.0000 [drp] | Freq: Once | OPHTHALMIC | Status: DC
  Filled 2019-02-04: qty 4

## 2019-02-04 NOTE — ED Provider Notes (Signed)
[facility] EMERGENCY DEPARTMENT Provider Note   CSN: [id number] Arrival date & time: 02/04/19  2111    History   Chief Complaint Chief Complaint  Patient presents with  . Eye Pain    HPI Leah Bright is a 28 y.o. female presenting with bilateral eye pain which Leah Bright woke with this morning.  Leah Bright has extended wear contacts, 2 weeks use which had been in for 2 weeks, took them out this am when Leah Bright woke irritated but without relief of sx.  Leah Bright also endorses pain and redness of her lower eyelids with soreness of the tear duct of the right eyelid. There has been copious clear tear watering.  Leah Bright denies n/v, fevers, photophobia and denies foreign body sensation. Leah Bright reports being allergic to "tree sap" and was standing outside yesterday beneath a tree that was being limbed and speculates this could have triggered her sx, denies any other exposures, no change in soap or facial products. Leah Bright tried an allergy eye drop without relief.     HPI  Past Medical History:  Diagnosis Date  . Agitation   . Anxiety   . Asthma   . Depression   . Hypertension   . Hypothyroidism   . Renal artery stenosis (HCC)   . Social anxiety disorder   . Social anxiety disorder   . Thyroid disease     Patient Active Problem List   Diagnosis Date Noted  . Hypothyroidism 08/02/2018  . Essential hypertension 08/02/2018  . Morbid obesity (HCC) 08/02/2018  . Type 2 diabetes mellitus without complication, without long-term current use of insulin (HCC) 08/02/2018  . 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  . Renal artery stenosis (HCC) 03/02/2018    Past Surgical History:  Procedure Laterality Date  . CHOLECYSTECTOMY    . LAPAROSCOPIC GASTRIC BANDING    . LAPAROSCOPIC REPAIR AND REMOVAL OF GASTRIC BAND    . TONSILLECTOMY    . WISDOM TOOTH EXTRACTION       OB History   No obstetric history on file.      Home Medications    Prior to Admission medications   Medication Sig Start Date  End Date Taking? Authorizing Provider  albuterol (PROVENTIL HFA;VENTOLIN HFA) 108 (90 Base) MCG/ACT inhaler Inhale 2 puffs into the lungs every 6 (six) hours as needed for wheezing or shortness of breath. 11/02/18   Jacquelin HawkingMcElroy, Shannon, PA-C  busPIRone (BUSPAR) 15 MG tablet Take 0.5 tablets (7.5 mg total) by mouth 2 (two) times daily. 03/02/18   Clapacs, Jackquline DenmarkJohn T, MD  fluticasone (FLONASE SENSIMIST) 27.5 MCG/SPRAY nasal spray Place 2 sprays into the nose daily as needed.     [provider]  levothyroxine (SYNTHROID, LEVOTHROID) 50 MCG tablet Take 1 tablet (50 mcg total) by mouth daily. 11/02/18   Jacquelin HawkingMcElroy, Shannon, PA-C  lisinopril (PRINIVIL,ZESTRIL) 40 MG tablet Take 1 tablet (40 mg total) by mouth daily. 11/02/18   Jacquelin HawkingMcElroy, Shannon, PA-C  metFORMIN (GLUCOPHAGE) 500 MG tablet Take 1 tablet (500 mg total) by mouth 2 (two) times daily with a meal. 11/02/18   Jacquelin HawkingMcElroy, Shannon, PA-C  sitaGLIPtin (JANUVIA) 100 MG tablet Take 1 tablet (100 mg total) by mouth daily. 11/02/18   Jacquelin HawkingMcElroy, Shannon, PA-C  traZODone (DESYREL) 100 MG tablet Take 50-100 mg by mouth at bedtime as needed.     [provider]  venlafaxine XR (EFFEXOR-XR) 150 MG 24 hr capsule Take 1 capsule (150 mg total) by mouth daily. 03/02/18 11/02/18  Clapacs, Jackquline DenmarkJohn T, MD    Family  History Family History  Problem Relation Age of Onset  . Cancer Mother        brain tumor  . Hypertension Mother   . Depression Mother   . Diabetes Mother   . Anxiety disorder Father   . Depression Father   . Hypertension Maternal Grandmother   . Thyroid disease Maternal Grandmother   . Hypertension Maternal Grandfather   . Cancer Maternal Grandfather        myositis    Social History Social History   Tobacco Use  . Smoking status: Never Smoker  . Smokeless tobacco: Never Used  Substance Use Topics  . Alcohol use: Yes    Comment: rarely  . Drug use: No     Allergies   Toradol [ketorolac tromethamine]; Codeine; and Pepto-bismol [bismuth]    Review of Systems Review of Systems  Constitutional: Negative for fever.  HENT: Negative for congestion, rhinorrhea, sneezing and sore throat.   Eyes: Positive for pain, discharge and redness. Negative for itching and visual disturbance.  Respiratory: Negative for chest tightness and shortness of breath.   Cardiovascular: Negative for chest pain.  Gastrointestinal: Negative for abdominal pain and nausea.  Genitourinary: Negative.   Musculoskeletal: Negative for arthralgias, joint swelling and neck pain.  Skin: Negative.  Negative for rash and wound.  Neurological: Negative for dizziness, weakness, light-headedness, numbness and headaches.  Psychiatric/Behavioral: Negative.      Physical Exam Updated Vital Signs BP (!) 154/110 (BP Location: Left Arm)   Pulse 92   Temp 99.1 F (37.3 C) (Oral)   Resp 16   Ht 5\' 5"  (1.651 m)   Wt 134.3 kg   SpO2 99%   BMI 49.26 kg/m   Physical Exam Vitals signs and nursing note reviewed.  Constitutional:      Appearance: Leah Bright is well-developed.  HENT:     Head: Normocephalic and atraumatic.     Nose: Nose normal.  Eyes:     General: Lids are everted, no foreign bodies appreciated. Vision grossly intact. No allergic shiner.    Extraocular Movements:     Right eye: Normal extraocular motion and no nystagmus.     Left eye: Normal extraocular motion and no nystagmus.     Conjunctiva/sclera:     Right eye: Right conjunctiva is injected. No chemosis or exudate.    Left eye: Left conjunctiva is injected. No chemosis or exudate.    Pupils: Pupils are equal, round, and reactive to light.     Right eye: No corneal abrasion or fluorescein uptake.     Left eye: No corneal abrasion or fluorescein uptake.     Slit lamp exam:    Right eye: Anterior chamber quiet. No corneal ulcer, foreign body or photophobia.     Left eye: Anterior chamber quiet. No corneal ulcer, foreign body or photophobia.     Comments: Visual Acuity  Bilateral Distance: 20/20  R  Distance: 20/20  L Distance: 20/20   Lower lids with mild erythema, no edema, sore to touch. No consensual pain. No photophobia. Trace of dried exudate lash bases right eye. No edema or purulence of the eye duct.  Neck:     Musculoskeletal: Normal range of motion.  Cardiovascular:     Rate and Rhythm: Normal rate and regular rhythm.     Heart sounds: Normal heart sounds.  Pulmonary:     Effort: Pulmonary effort is normal.     Breath sounds: Normal breath sounds. No wheezing.  Abdominal:     General: Bowel  sounds are normal.     Palpations: Abdomen is soft.     Tenderness: There is no abdominal tenderness.  Musculoskeletal: Normal range of motion.  Skin:    General: Skin is warm and dry.  Neurological:     Mental Status: Leah Bright is alert.      ED Treatments / Results  Labs (all labs ordered are listed, but only abnormal results are displayed) Labs Reviewed - No data to display  EKG None  Radiology No results found.  Procedures Procedures (including critical care time)  Medications Ordered in ED Medications  tetracaine (PONTOCAINE) 0.5 % ophthalmic solution 1 drop (has no administration in time range)  fluorescein ophthalmic strip 1 strip (has no administration in time range)  erythromycin ophthalmic ointment (1 application Both Eyes Given 02/04/19 2318)     Initial Impression / Assessment and Plan / ED Course  I have reviewed the triage vital signs and the nursing notes.  Pertinent labs & imaging results that were available during my care of the patient were reviewed by me and considered in my medical decision making (see chart for details).        Exam suggesting allergic/irritant conjunctivitis.  Leah Bright was given erythromycin to cover for infectious source but doubt.  Advised to stop rubbing the eyes, try cool compresses, esp of lower lids which I suspect are irritated from rubbing. Stop using contacts until sx resolve.  Vision normal with glasses. Referral to  ophthalmology for recheck if not improving over next 2 days.  No fb, no corneal abrasions. No findings suggesting cellulitis or iritis.  Final Clinical Impressions(s) / ED Diagnoses   Final diagnoses:  Allergic conjunctivitis of both eyes    ED Discharge Orders    None       Victoriano Lain 02/05/19 1235    Geoffery Lyons, MD 02/08/19 2342

## 2019-02-04 NOTE — Discharge Instructions (Addendum)
Avoiding rubbing your eyes.  Apply the antibiotic ointment to each eye 4 times daily.  Wear your glasses, avoiding wearing your contact lenses until your symptoms are completely resolved.

## 2019-02-04 NOTE — ED Triage Notes (Signed)
Pt c/o bilateral eye pain that started this AM. Pt states "it feels junky and burns." Pt has some drainage from eyes.

## 2019-02-09 ENCOUNTER — Ambulatory Visit: Payer: Self-pay | Admitting: Physician Assistant

## 2019-02-09 ENCOUNTER — Encounter: Payer: Self-pay | Admitting: Physician Assistant

## 2019-02-09 DIAGNOSIS — E785 Hyperlipidemia, unspecified: Secondary | ICD-10-CM

## 2019-02-09 DIAGNOSIS — F39 Unspecified mood [affective] disorder: Secondary | ICD-10-CM

## 2019-02-09 DIAGNOSIS — E039 Hypothyroidism, unspecified: Secondary | ICD-10-CM

## 2019-02-09 DIAGNOSIS — E119 Type 2 diabetes mellitus without complications: Secondary | ICD-10-CM

## 2019-02-09 DIAGNOSIS — I10 Essential (primary) hypertension: Secondary | ICD-10-CM

## 2019-02-09 NOTE — Progress Notes (Signed)
There were no vitals taken for this visit.   Subjective:    Patient ID: Leah Bright, female    DOB: 1990-09-04, 28 y.o.   MRN: [id number]  HPI: Leah Bright is a 28 y.o. female presenting on 02/09/2019 for No chief complaint on file.   HPI    This is a telemedicine visit through [employer] due to coronavirus pandemic.    I connected with  Leah Bright on 02/09/19 by a video enabled telemedicine application and verified that I am speaking with the correct person using two identifiers.   I discussed the limitations of evaluation and management by telemedicine. The patient expressed understanding and agreed to proceed.   Pt is at home. Provider is at office/clinic  Pt did not get labs as recommended.  She says she has been too busy with getting ready to go to the beach this weekend.   She is still going to [facility] for mental health  Her hand is doing better (seen in ER for strain)    Relevant past medical, surgical, family and social history reviewed and updated as indicated. Interim medical history since our last visit reviewed. Allergies and medications reviewed and updated.   Current Outpatient Medications:  .  albuterol (PROVENTIL HFA;VENTOLIN HFA) 108 (90 Base) MCG/ACT inhaler, Inhale 2 puffs into the lungs every 6 (six) hours as needed for wheezing or shortness of breath., Disp: 1 Inhaler, Rfl: 0 .  busPIRone (BUSPAR) 15 MG tablet, Take 0.5 tablets (7.5 mg total) by mouth 2 (two) times daily., Disp: 30 tablet, Rfl: 1 .  fluticasone (FLONASE SENSIMIST) 27.5 MCG/SPRAY nasal spray, Place 2 sprays into the nose daily as needed. , Disp: , Rfl:  .  levothyroxine (SYNTHROID, LEVOTHROID) 50 MCG tablet, Take 1 tablet (50 mcg total) by mouth daily., Disp: 90 tablet, Rfl: 0 .  lisinopril (PRINIVIL,ZESTRIL) 40 MG tablet, Take 1 tablet (40 mg total) by mouth daily., Disp: 90 tablet, Rfl: 0 .  metFORMIN (GLUCOPHAGE) 500 MG tablet, Take 1 tablet (500 mg total) by mouth 2 (two) times  daily with a meal., Disp: 180 tablet, Rfl: 0 .  sitaGLIPtin (JANUVIA) 100 MG tablet, Take 1 tablet (100 mg total) by mouth daily., Disp: 90 tablet, Rfl: 1 .  traZODone (DESYREL) 100 MG tablet, Take 50-100 mg by mouth at bedtime as needed. , Disp: , Rfl:  .  venlafaxine XR (EFFEXOR-XR) 150 MG 24 hr capsule, Take 1 capsule (150 mg total) by mouth daily., Disp: 30 capsule, Rfl: 1     Review of Systems  Per HPI unless specifically indicated above     Objective:    There were no vitals taken for this visit.  Wt Readings from Last 3 Encounters:  02/04/19 296 lb (134.3 kg)  01/28/19 296 lb (134.3 kg)  11/02/18 (!) 306 lb 8 oz (139 kg)    Physical Exam Constitutional:      General: She is not in acute distress.    Appearance: She is obese. She is not ill-appearing.  HENT:     Head: Normocephalic and atraumatic.  Pulmonary:     Effort: Pulmonary effort is normal. No respiratory distress.  Neurological:     Mental Status: She is alert and oriented to person, place, and time.  Psychiatric:        Attention and Perception: Attention normal.        Speech: Speech normal.        Behavior: Behavior is cooperative.  Assessment & Plan:    Encounter Diagnoses  Name Primary?  . Type 2 diabetes mellitus without complication, without long-term current use of insulin (HCC) Yes  . Essential hypertension   . Hypothyroidism, unspecified type   . Hyperlipidemia, unspecified hyperlipidemia type   . Mood disorder (HCC)   . Morbid obesity (HCC)     -Pt will get labs done next week after she returns from a trip to the beach  -[facility] -Pt encouraged to wear mask when in public per recommendations of CDC to reduce risk of getting covid-19 virus -pt to follow up 3 months.  She is to contact office sooner prn

## 2019-02-17 ENCOUNTER — Encounter (HOSPITAL_COMMUNITY): Payer: Self-pay | Admitting: Emergency Medicine

## 2019-02-17 ENCOUNTER — Emergency Department (HOSPITAL_COMMUNITY)
Admission: EM | Admit: 2019-02-17 | Discharge: 2019-02-17 | Disposition: A | Discharge status: Home / Self Care | Payer: Self-pay | Attending: Emergency Medicine | Admitting: Emergency Medicine

## 2019-02-17 ENCOUNTER — Other Ambulatory Visit: Payer: Self-pay

## 2019-02-17 DIAGNOSIS — J45909 Unspecified asthma, uncomplicated: Secondary | ICD-10-CM | POA: Insufficient documentation

## 2019-02-17 DIAGNOSIS — L739 Follicular disorder, unspecified: Secondary | ICD-10-CM | POA: Insufficient documentation

## 2019-02-17 DIAGNOSIS — E039 Hypothyroidism, unspecified: Secondary | ICD-10-CM | POA: Insufficient documentation

## 2019-02-17 DIAGNOSIS — E119 Type 2 diabetes mellitus without complications: Secondary | ICD-10-CM | POA: Insufficient documentation

## 2019-02-17 DIAGNOSIS — L728 Other follicular cysts of the skin and subcutaneous tissue: Secondary | ICD-10-CM

## 2019-02-17 DIAGNOSIS — I10 Essential (primary) hypertension: Secondary | ICD-10-CM | POA: Insufficient documentation

## 2019-02-17 DIAGNOSIS — Z79899 Other long term (current) drug therapy: Secondary | ICD-10-CM | POA: Insufficient documentation

## 2019-02-17 DIAGNOSIS — Z7984 Long term (current) use of oral hypoglycemic drugs: Secondary | ICD-10-CM | POA: Insufficient documentation

## 2019-02-17 MED ORDER — DOXYCYCLINE HYCLATE 100 MG PO CAPS: 100.0000 mg | ORAL_CAPSULE | Freq: Two times a day (BID) | ORAL | 0 refills | Status: DC

## 2019-02-17 MED ORDER — SELENIUM SULFIDE 1 % EX LOTN: 1.0000 "application " | TOPICAL_LOTION | Freq: Every day | CUTANEOUS | 1 refills | Status: DC

## 2019-02-17 MED ORDER — TRAMADOL HCL 50 MG PO TABS: 50.0000 mg | ORAL_TABLET | Freq: Four times a day (QID) | ORAL | 0 refills | Status: DC | PRN

## 2019-02-17 MED ORDER — ONDANSETRON HCL 4 MG PO TABS
4.0000 mg | ORAL_TABLET | Freq: Once | ORAL | Status: AC
  Administered 2019-02-17: 4 mg via ORAL
  Filled 2019-02-17: qty 1

## 2019-02-17 MED ORDER — DOXYCYCLINE HYCLATE 100 MG PO TABS
100.0000 mg | ORAL_TABLET | Freq: Once | ORAL | Status: AC
  Administered 2019-02-17: 100 mg via ORAL
  Filled 2019-02-17: qty 1

## 2019-02-17 MED ORDER — ACETAMINOPHEN 500 MG PO TABS
1000.0000 mg | ORAL_TABLET | Freq: Once | ORAL | Status: AC
  Administered 2019-02-17: 1000 mg via ORAL
  Filled 2019-02-17: qty 2

## 2019-02-17 NOTE — ED Provider Notes (Signed)
[facility] EMERGENCY DEPARTMENT Provider Note   CSN: [id number] Arrival date & time: 02/17/19  1149     History   Chief Complaint Chief Complaint  Patient presents with  . Abscess    HPI Leah Bright is a 28 y.o. female.     -  The history is provided by the patient.  Abscess Associated symptoms: headaches     Past Medical History:  Diagnosis Date  . Agitation   . Anxiety   . Asthma   . Depression   . Hypertension   . Hypothyroidism   . Renal artery stenosis ([place])   . Social anxiety disorder   . Social anxiety disorder   . Thyroid disease     Patient Active Problem List   Diagnosis Date Noted  . Hypothyroidism 08/02/2018  . Essential hypertension 08/02/2018  . Morbid obesity ([place]) 08/02/2018  . Type 2 diabetes mellitus without complication, without long-term current use of insulin ([place]) 08/02/2018  . Moderate recurrent major depression ([place]) 03/02/2018  . Renal artery stenosis ([place]) 03/02/2018    Past Surgical History:  Procedure Laterality Date  . CHOLECYSTECTOMY    . LAPAROSCOPIC GASTRIC BANDING    . LAPAROSCOPIC REPAIR AND REMOVAL OF GASTRIC BAND    . TONSILLECTOMY    . WISDOM TOOTH EXTRACTION       OB History    Gravida      Para      Term      Preterm      AB      Living  0     SAB      TAB      Ectopic      Multiple      Live Births               Home Medications    Prior to Admission medications   Medication Sig Start Date End Date Taking? Authorizing Provider  albuterol (PROVENTIL HFA;VENTOLIN HFA) 108 (90 Base) MCG/ACT inhaler Inhale 2 puffs into the lungs every 6 (six) hours as needed for wheezing or shortness of breath. 11/02/18   Soyla Dryer, PA-C  busPIRone (BUSPAR) 15 MG tablet Take 0.5 tablets (7.5 mg total) by mouth 2 (two) times daily. 03/02/18   Clapacs, Madie [place], MD  doxycycline (VIBRAMYCIN) 100 MG capsule Take 1 capsule (100 mg total) by mouth 2 (two) times daily. 02/17/19   Lily Kocher, PA-C   fluticasone (FLONASE SENSIMIST) 27.5 MCG/SPRAY nasal spray Place 2 sprays into the nose daily as needed.     [provider]  levothyroxine (SYNTHROID, LEVOTHROID) 50 MCG tablet Take 1 tablet (50 mcg total) by mouth daily. 11/02/18   Soyla Dryer, PA-C  lisinopril (PRINIVIL,ZESTRIL) 40 MG tablet Take 1 tablet (40 mg total) by mouth daily. 11/02/18   Soyla Dryer, PA-C  metFORMIN (GLUCOPHAGE) 500 MG tablet Take 1 tablet (500 mg total) by mouth 2 (two) times daily with a meal. 11/02/18   Soyla Dryer, PA-C  selenium sulfide (SELSUN) 1 % LOTN Apply 1 application topically daily. Shampoo daily for 5 - 7 days. 02/17/19   Lily Kocher, PA-C  sitaGLIPtin (JANUVIA) 100 MG tablet Take 1 tablet (100 mg total) by mouth daily. 11/02/18   Soyla Dryer, PA-C  traMADol (ULTRAM) 50 MG tablet Take 1 tablet (50 mg total) by mouth every 6 (six) hours as needed. 02/17/19   Lily Kocher, PA-C  traZODone (DESYREL) 100 MG tablet Take 50-100 mg by mouth at bedtime as needed.  [provider]  venlafaxine XR (EFFEXOR-XR) 150 MG 24 hr capsule Take 1 capsule (150 mg total) by mouth daily. 03/02/18 02/09/19  Clapacs, Jackquline DenmarkJohn T, MD    Family History Family History  Problem Relation Age of Onset  . Cancer Mother        brain tumor  . Hypertension Mother   . Depression Mother   . Diabetes Mother   . Anxiety disorder Father   . Depression Father   . Hypertension Maternal Grandmother   . Thyroid disease Maternal Grandmother   . Hypertension Maternal Grandfather   . Cancer Maternal Grandfather        myositis    Social History Social History   Tobacco Use  . Smoking status: Never Smoker  . Smokeless tobacco: Never Used  Substance Use Topics  . Alcohol use: Yes    Comment: rarely  . Drug use: No     Allergies   Toradol [ketorolac tromethamine], Codeine, and Pepto-bismol [bismuth]   Review of Systems Review of Systems  Constitutional: Negative for activity change.       All ROS  Neg except as noted in HPI  HENT: Negative for nosebleeds.   Eyes: Negative for photophobia and discharge.  Respiratory: Negative for cough, shortness of breath and wheezing.   Cardiovascular: Negative for chest pain and palpitations.  Gastrointestinal: Negative for abdominal pain and blood in stool.  Genitourinary: Negative for dysuria, frequency and hematuria.  Musculoskeletal: Negative for arthralgias, back pain and neck pain.  Skin: Negative.   Neurological: Positive for headaches. Negative for dizziness, seizures and speech difficulty.  Psychiatric/Behavioral: Negative for confusion and hallucinations.     Physical Exam Updated Vital Signs BP (!) 146/86   Pulse 87   Temp 98.4 F (36.9 C) (Oral)   Resp 16   LMP 02/12/2019   SpO2 99%   Physical Exam Vitals signs and nursing note reviewed.  Constitutional:      Appearance: She is well-developed. She is not toxic-appearing.  HENT:     Head: Normocephalic.      Comments: There are 3 small red raised areas on the scalp on the right greater than midline or on the left.  There is one raised area that appears to be a cyst with increased redness present.  No red streaks of the scalp.  No palpable nodes of the scalp.    Right Ear: Tympanic membrane, ear canal and external ear normal.     Left Ear: Tympanic membrane, ear canal and external ear normal.  Eyes:     General: Lids are normal.     Pupils: Pupils are equal, round, and reactive to light.  Neck:     Musculoskeletal: Normal range of motion and neck supple.     Vascular: No carotid bruit.     Comments: No cervical lymphadenopathy. Cardiovascular:     Rate and Rhythm: Normal rate and regular rhythm.     Pulses: Normal pulses.     Heart sounds: Normal heart sounds.  Pulmonary:     Effort: No respiratory distress.     Breath sounds: Normal breath sounds.  Abdominal:     General: Bowel sounds are normal.     Palpations: Abdomen is soft.     Tenderness: There is no  abdominal tenderness. There is no guarding.  Musculoskeletal: Normal range of motion.  Lymphadenopathy:     Head:     Right side of head: No submandibular adenopathy.     Left side of head: No  submandibular adenopathy.     Cervical: No cervical adenopathy.  Skin:    General: Skin is warm and dry.  Neurological:     Mental Status: She is alert and oriented to person, place, and time.     Cranial Nerves: No cranial nerve deficit.     Sensory: No sensory deficit.  Psychiatric:        Speech: Speech normal.      ED Treatments / Results  Labs (all labs ordered are listed, but only abnormal results are displayed) Labs Reviewed - No data to display  EKG    Radiology No results found.  Procedures Procedures (including critical care time)  Medications Ordered in ED Medications  acetaminophen (TYLENOL) tablet 1,000 mg (1,000 mg Oral Given 02/17/19 1406)  doxycycline (VIBRA-TABS) tablet 100 mg (100 mg Oral Given 02/17/19 1406)  ondansetron (ZOFRAN) tablet 4 mg (4 mg Oral Given 02/17/19 1406)     Initial Impression / Assessment and Plan / ED Course  I have reviewed the triage vital signs and the nursing notes.  Pertinent labs & imaging results that were available during my care of the patient were reviewed by me and considered in my medical decision making (see chart for details).          Final Clinical Impressions(s) / ED Diagnoses MDM  Vital signs reviewed.  There are multiple red raised areas of the scalp.  There is 1 with appearance of a cyst with increased redness present.  All of these are tender to touch.  Patient will be treated with medicated Selsun shampoo, as well as doxycycline.  Patient will use Tylenol every 4 hours for mild pain.  Patient will use Ultram for more severe pain.  Patient referred to Ms. Register for dermatology evaluation.   Final diagnoses:  Folliculitis  Other follicular cysts of the skin and subcutaneous tissue    ED Discharge Orders          Ordered    selenium sulfide (SELSUN) 1 % LOTN  Daily     02/17/19 1405    doxycycline (VIBRAMYCIN) 100 MG capsule  2 times daily     02/17/19 1405    traMADol (ULTRAM) 50 MG tablet  Every 6 hours PRN     02/17/19 1405           Ivery QualeBryant, Margaruite Top, PA-C 02/17/19 1439    Long, Arlyss RepressJoshua G, MD 02/17/19 [phone]

## 2019-02-17 NOTE — ED Triage Notes (Signed)
Patient noticed a cyst to her R lower lateral occipital area on Sunday. Edematous,red, and painful to the touch. Patient states she has also had a headache.

## 2019-02-17 NOTE — Discharge Instructions (Addendum)
You have at least 3 areas of red slightly raised tender lesions in your scalp.  There is also one area that appears to be a small cyst at the back of your head and scalp area.  Please use the Selsun medicated shampoo daily for the next 5 to 7 days.  Please use doxycycline 2 times daily with food.  Use Tylenol extra strength for mild pain, use Ultram for more severe pain.  Please see Leah Bright for dermatology evaluation of these areas on your scalp.

## 2019-05-30 ENCOUNTER — Ambulatory Visit: Payer: Self-pay | Admitting: Physician Assistant

## 2019-07-18 NOTE — Progress Notes (Addendum)
Patient: Leah Bright, Female    DOB: 08/14/1991, 28 y.o.   MRN: [id number] Visit Date: 07/19/2019  Today's Provider: Jairo Ben, FNP   Chief Complaint  Patient presents with  . New Patient (Initial Visit)   Subjective:    New Patient Leah Bright is a 28 y.o. female who presents today for health maintenance and establish care. She feels fairly well, patient states that she has a history of chronic migraine headaches with lightheadedness requiring a dark room episodes and patient reports that she was going to [facility] and was prescribed Firocet in past. Patient reports that her last migraine headache was 5 days ago but denied syncope episode. She reports she is not exercising . She reports she is sleeping fairly well.  Migraines for at least four years ago. She used floricet in past and it helped. She has migraine with aura " little squiggly lines". She requests neurology consult.  She [facility] exam. She sees gynecologist next week for Pap smear/ vaginal exam and declines here today.   She drinks lots of waters, few sodas, tries to eat healthy.   Denies any chance of pregnancy. She denies any sexual activity in the past 6 months. Declines recommended pregnancy test at office. She will do one today.  LMP 3 months ago. No birth control.    She has ran out of all of her maintenance medications at the end of august. She prior went to a free clinic and stopped going a few month ago.  ----------------------------------------------------------------- She has gallbladder removed 2009.  Lap band placed and removed in [place]. 2007 out 2011.  Tonsillectomy  at age 21.   Denies any suicidal or homicidal ideations. Buspar for the past two years and Effexor since 2012  per medication per list- she has been taking for the past two years for agitation/ irritability/ anxious.   She sees Visual merchandiser for [employer]. Last seen on 1 month ago-  she refills her Buspar, Effexor and Trazodone with [facility] and will continue to do so.   She sees a Education officer, community yearly.   She reports right renal stenosis,  09/14/2013 I see at [facility] care everywhere results of CT abd / pelvis  The following : ABDOMEN CT WITH IV CONTRAST:   Lung bases clear. Previous cholecystectomy. Chronically atrophic right kidney. Solid organs otherwise unremarkable. No free fluid or adenopathy. Ct scan 5/24/ as follow results:  Right kidney is asymmetrically small compared to left. Conclusion: No evidence of nephrolithiasis, ureterolithiasis, or bladder Stone. See [facility] note on 10/04/15 Ultrasound/ doppler renal was ordered but unable to find any evidence of results. 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 No recent doppler US evaluation of kidney size or extent of renal artery stenosis. Creatinine within normal limits suggests appropriate compensation. No h/o horseshoe kidneys Will need US renal w/ doppler to assess renal artery blood flow and size of kidneys comparatively. Congenital renal aa stenosis unusual, ?posterior urethral valve injury overtime given multiple uti;s as a child and adult.  Continue lisinopril 40 mg daily for now.  Will follow up w/ renal US w/ doppler    She fell on Thursday 07/14/2019 at home on the back porch and landed on her tail bone it was slippery and wet and she was wearing crocs. No numbness or tingling in extremities or at sight. She denies any loss of consciousness at that time.  Review of Systems  Constitutional: Negative for activity change, appetite change, chills, diaphoresis, fatigue, fever and unexpected weight change.  HENT: Negative.   Respiratory: Negative.   Cardiovascular: Negative.  Negative for chest pain, palpitations and leg swelling.  Gastrointestinal: Positive for diarrhea (none currently has with migraines maybe once a month ), nausea (due to anxiety and headaches  - none now - maybe occurs one time a month ) and vomiting (with migraine. occurs one time a month with migraine ). Negative for abdominal distention, abdominal pain, anal bleeding, blood in stool, constipation and rectal pain.  Genitourinary: Negative.        History self reported of renal stenosis - right   Musculoskeletal: Positive for back pain (" tailbone pain" from fall last thursday ). Negative for arthralgias, gait problem, joint swelling, myalgias, neck pain and neck stiffness.  Skin: Negative.   Allergic/Immunologic: Positive for environmental allergies.  Neurological: Positive for dizziness (none this time-  has currently with migraine only ), light-headedness (none presently with migraines only ) and headaches. Negative for tremors, seizures, syncope, facial asymmetry, speech difficulty, weakness and numbness.  Hematological: Negative.   Psychiatric/Behavioral: Positive for agitation (controlled with medications- denies any currently). Negative for behavioral problems, confusion, decreased concentration, dysphoric mood, hallucinations, self-injury, sleep disturbance and suicidal ideas. The patient is nervous/anxious (miuld anxiousness in room ). The patient is not hyperactive.        Reports social anxiety   All other systems reviewed and are negative.   Social History She  reports that she has never smoked. She has never used smokeless tobacco. She reports current alcohol use. She reports that she does not use drugs.  Social History   Tobacco Use  . Smoking status: Never Smoker  . Smokeless tobacco: Never Used  Substance Use Topics  . Alcohol use: Yes    Comment: rarely  . Drug use: No    Patient Active Problem List   Diagnosis Date Noted  . Hypothyroidism 08/02/2018  . Essential hypertension 08/02/2018  . Morbid obesity (HCC) 08/02/2018  . Type 2 diabetes mellitus without complication, without long-term current use of insulin (HCC) 08/02/2018  . Moderate recurrent major  depression (HCC) 03/02/2018  . Renal artery stenosis (HCC) 03/02/2018    Past Surgical History:  Procedure Laterality Date  . CHOLECYSTECTOMY    . LAPAROSCOPIC GASTRIC BANDING    . LAPAROSCOPIC REPAIR AND REMOVAL OF GASTRIC BAND    . TONSILLECTOMY    . WISDOM TOOTH EXTRACTION      Family History  Family Status  Relation Name Status  . Mother  Alive  . Father  Alive  . MGM  (Not Specified)  . MGF  (Not Specified)   Her family history includes Anxiety disorder in her father; Cancer in her maternal grandfather and mother; Depression in her father and mother; Diabetes in her maternal grandfather, maternal grandmother, and mother; High blood pressure in her maternal grandfather; Hypertension in her maternal grandfather, maternal grandmother, and mother; Stroke in her maternal grandfather; Thyroid disease in her maternal grandmother.     Allergies  Allergen Reactions  . Toradol [Ketorolac Tromethamine] Hives  . Codeine Rash  . Other Rash    Cilantro and Cedar trees  . Pepto-Bismol [Bismuth] Rash  . Tramadol Rash    Previous Medications   ALBUTEROL (PROVENTIL HFA;VENTOLIN HFA) 108 (90 BASE) MCG/ACT INHALER    Inhale 2 puffs into the lungs every 6 (six) hours as needed for wheezing or shortness of breath.   BUSPIRONE (  BUSPAR) 10 MG TABLET    Take 10 mg by mouth 2 (two) times daily.   LEVOTHYROXINE (SYNTHROID, LEVOTHROID) 50 MCG TABLET    Take 1 tablet (50 mcg total) by mouth daily.   LISINOPRIL (PRINIVIL,ZESTRIL) 40 MG TABLET    Take 1 tablet (40 mg total) by mouth daily.   METFORMIN (GLUCOPHAGE) 500 MG TABLET    Take 1 tablet (500 mg total) by mouth 2 (two) times daily with a meal.   SITAGLIPTIN (JANUVIA) 100 MG TABLET    Take 1 tablet (100 mg total) by mouth daily.   TRAZODONE (DESYREL) 100 MG TABLET    Take 50-100 mg by mouth at bedtime as needed.    VENLAFAXINE XR (EFFEXOR-XR) 150 MG 24 HR CAPSULE    Take 1 capsule (150 mg total) by mouth daily.    Patient Care Team:  Doreen Beam, FNP as PCP - General (Family Medicine)      Objective:   Blood pressure 138/88, pulse 87, temperature (!) 96.6 F (35.9 C), temperature source Oral, resp. rate 16, height 5' 4.5" (1.638 m), weight (!) 309 lb 12.8 oz (140.5 kg), SpO2 99 %.   Vitals: BP (!) 138/112   Pulse 87   Temp (!) 96.6 F (35.9 C) (Oral)   Resp 16   Ht 5' 4.5" (1.638 m)   Wt (!) 309 lb 12.8 oz (140.5 kg)   LMP  (Within Months) Comment: patient reports that cycle is irregular LMP reported 3 months ago  SpO2 99%   BMI 52.36 kg/m    Physical Exam Vitals signs reviewed.  Constitutional:      General: She is not in acute distress.    Appearance: Normal appearance. She is obese. She is not ill-appearing, toxic-appearing or diaphoretic.  HENT:     Head: Normocephalic and atraumatic.     Jaw: There is normal jaw occlusion.     Salivary Glands: Right salivary gland is not diffusely enlarged or tender. Left salivary gland is not diffusely enlarged or tender.     Right Ear: Tympanic membrane, ear canal and external ear normal. There is impacted cerumen.     Left Ear: Tympanic membrane, ear canal and external ear normal. There is impacted cerumen.     Nose: Nose normal. No congestion or rhinorrhea.     Mouth/Throat:     Mouth: Mucous membranes are dry.     Pharynx: No oropharyngeal exudate or posterior oropharyngeal erythema.  Eyes:     General: Lids are normal. Vision grossly intact. No scleral icterus.       Right eye: No discharge.        Left eye: No discharge.     Extraocular Movements: Extraocular movements intact.     Conjunctiva/sclera: Conjunctivae normal.     Pupils: Pupils are equal, round, and reactive to light.  Neck:     Musculoskeletal: Normal range of motion and neck supple. No neck rigidity.     Thyroid: No thyroid mass, thyromegaly or thyroid tenderness.     Trachea: Trachea and phonation normal.  Cardiovascular:     Pulses: Normal pulses.     Heart sounds: Normal  heart sounds.  Pulmonary:     Effort: Pulmonary effort is normal. No respiratory distress.     Breath sounds: Normal breath sounds. No stridor. No wheezing, rhonchi or rales.  Chest:     Chest wall: No tenderness.     Comments: deferred breast exam by patient.  Abdominal:     General:  Bowel sounds are normal. There is no distension.     Palpations: Abdomen is soft. There is no mass.     Tenderness: There is no abdominal tenderness. There is no right CVA tenderness, left CVA tenderness, guarding or rebound.     Hernia: No hernia is present.     Comments: Round obese   Genitourinary:    Comments: deferred by patient  Musculoskeletal:     Lumbar back: She exhibits tenderness (bruised at area of coccyx due to fall last thursday, tender with palpation, hurts to sit). She exhibits normal range of motion, no bony tenderness, no swelling, no edema, no deformity, no laceration, no pain, no spasm and normal pulse.       Back:  Lymphadenopathy:     Cervical: No cervical adenopathy.  Skin:    Capillary Refill: Capillary refill takes less than 2 seconds.  Neurological:     Mental Status: She is alert and oriented to person, place, and time.     Cranial Nerves: No cranial nerve deficit.     Sensory: No sensory deficit.     Motor: No weakness.     Coordination: Coordination normal.     Gait: Gait normal.     Deep Tendon Reflexes: Reflexes normal.  Psychiatric:        Attention and Perception: Attention and perception normal.        Mood and Affect: Affect normal. Mood is anxious.        Behavior: Behavior normal. Behavior is cooperative.        Thought Content: Thought content normal. Thought content does not include homicidal or suicidal plan.        Cognition and Memory: Cognition and memory normal.        Judgment: Judgment normal.      Depression Screen PHQ 2/9 Scores 07/19/2019  PHQ - 2 Score 2  PHQ- 9 Score 7      Assessment & Plan:     Routine Health Maintenance and  Physical Exam  Exercise Activities and Dietary recommendations Goals   None      There is no immunization history on file for this patient.  Health Maintenance  Topic Date Due  . PNEUMOCOCCAL POLYSACCHARIDE VACCINE AGE 6-64 HIGH RISK  03/03/1991  . FOOT EXAM  03/03/1999  . OPHTHALMOLOGY EXAM  03/03/1999  . HIV Screening  03/02/2004  . TETANUS/TDAP  03/02/2008  . PAP SMEAR-Modifier  03/02/2010  . INFLUENZA VACCINE  04/02/2019  . HEMOGLOBIN A1C  04/26/2019  Declines HIV screening, influenza, she is self pay. She is aware she can use health department for certain immunizations and screenings.    Discussed health benefits of physical activity, and encouraged her to engage in regular exercise appropriate for her age and condition.    She is self pay and declined any immunizations at this time. She also declined recommended pregnancy test. Discussed risks versus benefits.  1. History of hypothyroidism She has been off her medications since August, will obtain labs before restarting.   2. History of hypertension  Elevated blood pressure today, she has been off medications since August will obtain labs before restarting. Discussed current medications Decrease sodium and processed foods.   3. History of diabetes mellitus, type II She is fasting today and will obtain labs before starting her medications back she has been off since August.   4. Moderate recurrent major depression (HCC) She is managed at [facility] and will continue to follow up with that office  for her mental health needs and continue to see her counselor. She will obtain mental health  medications from them.   5. History of migraine with aura She has maintenance medication on file and also will refer to neurology for evaluation of migraines as she feels they have become worse this year.  Discussed RED Flags.  Call if you have not heard from neurology within 2 weeks regarding referral.   6. Fall, initial  encounter  Aleve per package instructions for two weeks. [name] Rest and elevate the affected painful area.  Apply cold compresses intermittently as needed.  As pain recedes, begin normal activities slowly as tolerated.  Call if symptoms persist.  X ray of Coccyx was ordered at [facility] imaging for today.  Discussed RED Flags.   7. BMI 50.0-59.9, adult (HCC) Discussed weight loss and healthy diet and need to increased exercise and activity as well as change dietary habits.   8. Amenorrhea Recommend pregnancy test by urine or blood. Patient declined. She is seeing gynecology next week  for female exam. She denies any pregnancy. Discussed medication she currently has listed and possible tetragenic effects. She still declines pregnancy test at this time - denying any chance of pregnancy.   9. Encounter for annual physical exam Bring in immunization records so we can get patient up to date on CDC recommended immunizations.  Encourage healthy lifestyle and activity.    10. History of hypothyroidism Will check TSH - before restarting medications she has been off medications since August 2020.  Orders Placed This Encounter  Procedures  . DG Sacrum/Coccyx  . Executive Panel, Female [phone]  . Ambulatory referral to Neurology  . POCT urine pregnancy   She is fasting today and will go  For labs.   The entirety of the information documented in the History of Present Illness, Review of Systems and Physical Exam were personally obtained by me. Portions of this information were initially documented by the  Certified Medical Assistant whose name is documented in Epic and reviewed by me for thoroughness and accuracy.  I have personally performed the exam and reviewed the chart and it is accurate to the best of my knowledge.  Museum/gallery conservator has been used and any errors in dictation or transcription are unintentional.  Eula Fried. Jerrol Helmers FNP-C  [facility]    --------------------------------------------------------------------

## 2019-07-19 ENCOUNTER — Other Ambulatory Visit: Payer: Self-pay

## 2019-07-19 ENCOUNTER — Ambulatory Visit (INDEPENDENT_AMBULATORY_CARE_PROVIDER_SITE_OTHER): Payer: Self-pay | Admitting: Adult Health

## 2019-07-19 ENCOUNTER — Encounter: Payer: Self-pay | Admitting: Adult Health

## 2019-07-19 ENCOUNTER — Other Ambulatory Visit: Payer: Self-pay | Admitting: Adult Health

## 2019-07-19 VITALS — BP 142/97 | HR 87 | Temp 96.6°F | Resp 16 | Ht 64.5 in | Wt 309.8 lb

## 2019-07-19 DIAGNOSIS — Z8679 Personal history of other diseases of the circulatory system: Secondary | ICD-10-CM

## 2019-07-19 DIAGNOSIS — F331 Major depressive disorder, recurrent, moderate: Secondary | ICD-10-CM

## 2019-07-19 DIAGNOSIS — W19XXXA Unspecified fall, initial encounter: Secondary | ICD-10-CM

## 2019-07-19 DIAGNOSIS — E7800 Pure hypercholesterolemia, unspecified: Secondary | ICD-10-CM

## 2019-07-19 DIAGNOSIS — Z8639 Personal history of other endocrine, nutritional and metabolic disease: Secondary | ICD-10-CM

## 2019-07-19 DIAGNOSIS — Z6841 Body Mass Index (BMI) 40.0 and over, adult: Secondary | ICD-10-CM

## 2019-07-19 DIAGNOSIS — Z8669 Personal history of other diseases of the nervous system and sense organs: Secondary | ICD-10-CM

## 2019-07-19 DIAGNOSIS — N912 Amenorrhea, unspecified: Secondary | ICD-10-CM

## 2019-07-19 DIAGNOSIS — R748 Abnormal levels of other serum enzymes: Secondary | ICD-10-CM

## 2019-07-19 DIAGNOSIS — Z0000 Encounter for general adult medical examination without abnormal findings: Secondary | ICD-10-CM

## 2019-07-19 NOTE — Patient Instructions (Addendum)
Orders Placed This Encounter  Procedures  . DG Sacrum/Coccyx  . Executive Panel, Female [phone]  . HgB A1c  . Ambulatory referral to Neurology  . POCT urine pregnancy       Heart-Healthy Eating Plan Heart-healthy meal planning includes:  Eating less unhealthy fats.  Eating more healthy fats.  Making other changes in your diet. Talk with your doctor or a diet specialist (dietitian) to create an eating plan that is right for you. What is my plan? Your doctor may recommend an eating plan that includes:  Total fat: ______% or less of total calories a day.  Saturated fat: ______% or less of total calories a day.  Cholesterol: less than _________mg a day. What are tips for following this plan? Cooking Avoid frying your food. Try to bake, boil, grill, or broil it instead. You can also reduce fat by:  Removing the skin from poultry.  Removing all visible fats from meats.  Steaming vegetables in water or broth. Meal planning   At meals, divide your plate into four equal parts: ? Fill one-half of your plate with vegetables and green salads. ? Fill one-fourth of your plate with whole grains. ? Fill one-fourth of your plate with lean protein foods.  Eat 4-5 servings of vegetables per day. A serving of vegetables is: ? 1 cup of raw or cooked vegetables. ? 2 cups of raw leafy greens.  Eat 4-5 servings of fruit per day. A serving of fruit is: ? 1 medium whole fruit. ?  cup of dried fruit. ?  cup of fresh, frozen, or canned fruit. ?  cup of 100% fruit juice.  Eat more foods that have soluble fiber. These are apples, broccoli, carrots, beans, peas, and barley. Try to get 20-30 g of fiber per day.  Eat 4-5 servings of nuts, legumes, and seeds per week: ? 1 serving of dried beans or legumes equals  cup after being cooked. ? 1 serving of nuts is  cup. ? 1 serving of seeds equals 1 tablespoon. General information  Eat more home-cooked food. Eat less restaurant, buffet,  and fast food.  Limit or avoid alcohol.  Limit foods that are high in starch and sugar.  Avoid fried foods.  Lose weight if you are overweight.  Keep track of how much salt (sodium) you eat. This is important if you have high blood pressure. Ask your doctor to tell you more about this.  Try to add vegetarian meals each week. Fats  Choose healthy fats. These include olive oil and canola oil, flaxseeds, walnuts, almonds, and seeds.  Eat more omega-3 fats. These include salmon, mackerel, sardines, tuna, flaxseed oil, and ground flaxseeds. Try to eat fish at least 2 times each week.  Check food labels. Avoid foods with trans fats or high amounts of saturated fat.  Limit saturated fats. ? These are often found in animal products, such as meats, butter, and cream. ? These are also found in plant foods, such as palm oil, palm kernel oil, and coconut oil.  Avoid foods with partially hydrogenated oils in them. These have trans fats. Examples are stick margarine, some tub margarines, cookies, crackers, and other baked goods. What foods can I eat? Fruits All fresh, canned (in natural juice), or frozen fruits. Vegetables Fresh or frozen vegetables (raw, steamed, roasted, or grilled). Green salads. Grains Most grains. Choose whole wheat and whole grains most of the time. Rice and pasta, including brown rice and pastas made with whole wheat. Meats and other proteins  Lean, well-trimmed beef, veal, pork, and lamb. Chicken and [place] without skin. All fish and shellfish. Wild duck, rabbit, pheasant, and venison. Egg whites or low-cholesterol egg substitutes. Dried beans, peas, lentils, and tofu. Seeds and most nuts. Dairy Low-fat or nonfat cheeses, including ricotta and mozzarella. Skim or 1% milk that is liquid, powdered, or evaporated. Buttermilk that is made with low-fat milk. Nonfat or low-fat yogurt. Fats and oils Non-hydrogenated (trans-free) margarines. Vegetable oils, including soybean,  sesame, sunflower, olive, peanut, safflower, corn, canola, and cottonseed. Salad dressings or mayonnaise made with a vegetable oil. Beverages Mineral water. Coffee and tea. Diet carbonated beverages. Sweets and desserts Sherbet, gelatin, and fruit ice. Small amounts of dark chocolate. Limit all sweets and desserts. Seasonings and condiments All seasonings and condiments. The items listed above may not be a complete list of foods and drinks you can eat. Contact a dietitian for more options. What foods should I avoid? Fruits Canned fruit in heavy syrup. Fruit in cream or butter sauce. Fried fruit. Limit coconut. Vegetables Vegetables cooked in cheese, cream, or butter sauce. Fried vegetables. Grains Breads that are made with saturated or trans fats, oils, or whole milk. Croissants. Sweet rolls. Donuts. High-fat crackers, such as cheese crackers. Meats and other proteins Fatty meats, such as hot dogs, ribs, sausage, bacon, rib-eye roast or steak. High-fat deli meats, such as salami and bologna. Caviar. Domestic duck and goose. Organ meats, such as liver. Dairy Cream, sour cream, cream cheese, and creamed cottage cheese. Whole-milk cheeses. Whole or 2% milk that is liquid, evaporated, or condensed. Whole buttermilk. Cream sauce or high-fat cheese sauce. Yogurt that is made from whole milk. Fats and oils Meat fat, or shortening. Cocoa butter, hydrogenated oils, palm oil, coconut oil, palm kernel oil. Solid fats and shortenings, including bacon fat, salt pork, lard, and butter. Nondairy cream substitutes. Salad dressings with cheese or sour cream. Beverages Regular sodas and juice drinks with added sugar. Sweets and desserts Frosting. Pudding. Cookies. Cakes. Pies. Milk chocolate or white chocolate. Buttered syrups. Full-fat ice cream or ice cream drinks. The items listed above may not be a complete list of foods and drinks to avoid. Contact a dietitian for more information. Summary   Heart-healthy meal planning includes eating less unhealthy fats, eating more healthy fats, and making other changes in your diet.  Eat a balanced diet. This includes fruits and vegetables, low-fat or nonfat dairy, lean protein, nuts and legumes, whole grains, and heart-healthy oils and fats. This information is not intended to replace advice given to you by your health care provider. Make sure you discuss any questions you have with your health care provider. Document Released: 02/17/2012 Document Revised: 10/22/2017 Document Reviewed: 09/25/2017 Elsevier Patient Education  2020 [employer].  Hypertension, Adult Hypertension is another name for high blood pressure. High blood pressure forces your heart to work harder to pump blood. This can cause problems over time. There are two numbers in a blood pressure reading. There is a top number (systolic) over a bottom number (diastolic). It is best to have a blood pressure that is below 120/80. Healthy choices can help lower your blood pressure, or you may need medicine to help lower it. What are the causes? The cause of this condition is not known. Some conditions may be related to high blood pressure. What increases the risk?  Smoking.  Having type 2 diabetes mellitus, high cholesterol, or both.  Not getting enough exercise or physical activity.  Being overweight.  Having too much fat, sugar,  calories, or salt (sodium) in your diet.  Drinking too much alcohol.  Having long-term (chronic) kidney disease.  Having a family history of high blood pressure.  Age. Risk increases with age.  Race. You may be at higher risk if you are African American.  Gender. Men are at higher risk than women before age 58. After age 28, women are at higher risk than men.  Having obstructive sleep apnea.  Stress. What are the signs or symptoms?  High blood pressure may not cause symptoms. Very high blood pressure (hypertensive crisis) may cause: ?  Headache. ? Feelings of worry or nervousness (anxiety). ? Shortness of breath. ? Nosebleed. ? A feeling of being sick to your stomach (nausea). ? Throwing up (vomiting). ? Changes in how you see. ? Very bad chest pain. ? Seizures. How is this treated?  This condition is treated by making healthy lifestyle changes, such as: ? Eating healthy foods. ? Exercising more. ? Drinking less alcohol.  Your health care provider may prescribe medicine if lifestyle changes are not enough to get your blood pressure under control, and if: ? Your top number is above 130. ? Your bottom number is above 80.  Your personal target blood pressure may vary. Follow these instructions at home: Eating and drinking   If told, follow the DASH eating plan. To follow this plan: ? Fill one half of your plate at each meal with fruits and vegetables. ? Fill one fourth of your plate at each meal with whole grains. Whole grains include whole-wheat pasta, brown rice, and whole-grain bread. ? Eat or drink low-fat dairy products, such as skim milk or low-fat yogurt. ? Fill one fourth of your plate at each meal with low-fat (lean) proteins. Low-fat proteins include fish, chicken without skin, eggs, beans, and tofu. ? Avoid fatty meat, cured and processed meat, or chicken with skin. ? Avoid pre-made or processed food.  Eat less than 1,500 mg of salt each day.  Do not drink alcohol if: ? Your doctor tells you not to drink. ? You are pregnant, may be pregnant, or are planning to become pregnant.  If you drink alcohol: ? Limit how much you use to:  0-1 drink a day for women.  0-2 drinks a day for men. ? Be aware of how much alcohol is in your drink. In the [place] one drink equals one 12 oz bottle of beer (355 mL), one 5 oz glass of wine (148 mL), or one 1 oz glass of hard liquor (44 mL). Lifestyle   Work with your doctor to stay at a healthy weight or to lose weight. Ask your doctor what the best weight is for  you.  Get at least 30 minutes of exercise most days of the week. This may include walking, swimming, or biking.  Get at least 30 minutes of exercise that strengthens your muscles (resistance exercise) at least 3 days a week. This may include lifting weights or doing Pilates.  Do not use any products that contain nicotine or tobacco, such as cigarettes, e-cigarettes, and chewing tobacco. If you need help quitting, ask your doctor.  Check your blood pressure at home as told by your doctor.  Keep all follow-up visits as told by your doctor. This is important. Medicines  Take over-the-counter and prescription medicines only as told by your doctor. Follow directions carefully.  Do not skip doses of blood pressure medicine. The medicine does not work as well if you skip doses. Skipping doses also puts  you at risk for problems.  Ask your doctor about side effects or reactions to medicines that you should watch for. Contact a doctor if you:  Think you are having a reaction to the medicine you are taking.  Have headaches that keep coming back (recurring).  Feel dizzy.  Have swelling in your ankles.  Have trouble with your vision. Get help right away if you:  Get a very bad headache.  Start to feel mixed up (confused).  Feel weak or numb.  Feel faint.  Have very bad pain in your: ? Chest. ? Belly (abdomen).  Throw up more than once.  Have trouble breathing. Summary  Hypertension is another name for high blood pressure.  High blood pressure forces your heart to work harder to pump blood.  For most people, a normal blood pressure is less than 120/80.  Making healthy choices can help lower blood pressure. If your blood pressure does not get lower with healthy choices, you may need to take medicine. This information is not intended to replace advice given to you by your health care provider. Make sure you discuss any questions you have with your health care provider.  Document Released: 02/04/2008 Document Revised: 04/28/2018 Document Reviewed: 04/28/2018 Elsevier Patient Education  2020 [employer]   Tailbone Injury The tailbone is the small bone at the lower end of the backbone (spine). The tailbone can become injured from:  A fall.  Sitting to row or bike for a long time.  Having a baby. This type of injury can be painful. Most tailbone injuries get better on their own in 4-6 weeks. Follow these instructions at home: Activity  Avoid sitting in one place for a long time.  Wear proper pads and gear when riding a bike or rowing.  Increase your activity as the pain allows.  Do exercises as told by your doctor or physical therapist. Managing pain, stiffness, and swelling  To lessen pain: ? Sit on a large, rubber or inflated ring or cushion. ? Lean forward when you sit.  If told, apply ice to the injured area. ? Put ice in a plastic bag. ? Place a towel between your skin and the bag. ? Leave the ice on for 20 minutes, 2-3 times per day. Do this for the first 1-2 days.  If told, put heat on the injured area. Do this as often as told by your doctor. Use the heat source that your doctor recommends, such as a moist heat pack or a heating pad. ? Place a towel between your skin and the heat source. ? Leave the heat on for 20-30 minutes. ? Remove the heat if your skin turns bright red. This is very important if you are unable to feel pain, heat, or cold. You may have a greater risk of getting burned. General instructions  Take over-the-counter and prescription medicines only as told by your doctor.  To prevent or treat trouble pooping (constipation) or pain when pooping, your doctor may suggest that you: ? Drink enough fluid to keep your pee (urine) pale yellow. ? Eat foods that are high in fiber. These include fresh fruits and vegetables, whole grains, and beans. ? Limit foods that are high in fat and sugar. These include fried and sweet  foods. ? Take an over-the-counter or prescription medicine to treat trouble pooping.  Keep all follow-up visits as told by your doctor. This is important. Contact a doctor if:  Your pain gets worse.  Pooping causes you pain.  You cannot poop after 4 days.  You have pain during sex. Summary  A tailbone injury can happen from a fall, from sitting for a long time to row or bike, or after having a baby.  These injuries can be painful. Most tailbone injuries get better on their own in 4-6 weeks.  Sit on a large, rubber or inflated ring or cushion to lessen pain.  Avoid sitting in one place for a long time.  Follow your doctor's suggestions to prevent or treat trouble pooping. This information is not intended to replace advice given to you by your health care provider. Make sure you discuss any questions you have with your health care provider. Document Released: 09/20/2010 Document Revised: 09/15/2017 Document Reviewed: 09/15/2017 Elsevier Patient Education  2020 [employer].   Health Maintenance, Female Adopting a healthy lifestyle and getting preventive care are important in promoting health and wellness. Ask your health care provider about:  The right schedule for you to have regular tests and exams.  Things you can do on your own to prevent diseases and keep yourself healthy. What should I know about diet, weight, and exercise? Eat a healthy diet   Eat a diet that includes plenty of vegetables, fruits, low-fat dairy products, and lean protein.  Do not eat a lot of foods that are high in solid fats, added sugars, or sodium. Maintain a healthy weight Body mass index (BMI) is used to identify weight problems. It estimates body fat based on height and weight. Your health care provider can help determine your BMI and help you achieve or maintain a healthy weight. Get regular exercise Get regular exercise. This is one of the most important things you can do for your health.  Most adults should:  Exercise for at least 150 minutes each week. The exercise should increase your heart rate and make you sweat (moderate-intensity exercise).  Do strengthening exercises at least twice a week. This is in addition to the moderate-intensity exercise.  Spend less time sitting. Even light physical activity can be beneficial. Watch cholesterol and blood lipids Have your blood tested for lipids and cholesterol at 30 years of age, then have this test every 5 years. Have your cholesterol levels checked more often if:  Your lipid or cholesterol levels are high.  You are older than 30 years of age.  You are at high risk for heart disease. What should I know about cancer screening? Depending on your health history and family history, you may need to have cancer screening at various ages. This may include screening for:  Breast cancer.  Cervical cancer.  Colorectal cancer.  Skin cancer.  Lung cancer. What should I know about heart disease, diabetes, and high blood pressure? Blood pressure and heart disease  High blood pressure causes heart disease and increases the risk of stroke. This is more likely to develop in people who have high blood pressure readings, are of African descent, or are overweight.  Have your blood pressure checked: ? Every 3-5 years if you are 29-52 years of age. ? Every year if you are 45 years old or older. Diabetes Have regular diabetes screenings. This checks your fasting blood sugar level. Have the screening done:  Once every three years after age 45 if you are at a normal weight and have a low risk for diabetes.  More often and at a younger age if you are overweight or have a high risk for diabetes. What should I know about preventing infection? Hepatitis B  If you have a higher risk for hepatitis B, you should be screened for this virus. Talk with your health care provider to find out if you are at risk for hepatitis B infection.  Hepatitis C Testing is recommended for:  Everyone born from [birth year] through 1967.  Anyone with known risk factors for hepatitis C. Sexually transmitted infections (STIs)  Get screened for STIs, including gonorrhea and chlamydia, if: ? You are sexually active and are younger than 30 years of age. ? You are older than 30 years of age and your health care provider tells you that you are at risk for this type of infection. ? Your sexual activity has changed since you were last screened, and you are at increased risk for chlamydia or gonorrhea. Ask your health care provider if you are at risk.  Ask your health care provider about whether you are at high risk for HIV. Your health care provider may recommend a prescription medicine to help prevent HIV infection. If you choose to take medicine to prevent HIV, you should first get tested for HIV. You should then be tested every 3 months for as long as you are taking the medicine. Pregnancy  If you are about to stop having your period (premenopausal) and you may become pregnant, seek counseling before you get pregnant.  Take 400 to 800 micrograms (mcg) of folic acid every day if you become pregnant.  Ask for birth control (contraception) if you want to prevent pregnancy. Osteoporosis and menopause Osteoporosis is a disease in which the bones lose minerals and strength with aging. This can result in bone fractures. If you are 60 years old or older, or if you are at risk for osteoporosis and fractures, ask your health care provider if you should:  Be screened for bone loss.  Take a calcium or vitamin D supplement to lower your risk of fractures.  Be given hormone replacement therapy (HRT) to treat symptoms of menopause. Follow these instructions at home: Lifestyle  Do not use any products that contain nicotine or tobacco, such as cigarettes, e-cigarettes, and chewing tobacco. If you need help quitting, ask your health care provider.  Do not use  street drugs.  Do not share needles.  Ask your health care provider for help if you need support or information about quitting drugs. Alcohol use  Do not drink alcohol if: ? Your health care provider tells you not to drink. ? You are pregnant, may be pregnant, or are planning to become pregnant.  If you drink alcohol: ? Limit how much you use to 0-1 drink a day. ? Limit intake if you are breastfeeding.  Be aware of how much alcohol is in your drink. In the [place] one drink equals one 12 oz bottle of beer (355 mL), one 5 oz glass of wine (148 mL), or one 1 oz glass of hard liquor (44 mL). General instructions  Schedule regular health, dental, and eye exams.  Stay current with your vaccines.  Tell your health care provider if: ? You often feel depressed. ? You have ever been abused or do not feel safe at home. Summary  Adopting a healthy lifestyle and getting preventive care are important in promoting health and wellness.  Follow your health care provider's instructions about healthy diet, exercising, and getting tested or screened for diseases.  Follow your health care provider's instructions on monitoring your cholesterol and blood pressure. This information is not intended to replace advice given to you by your health care  provider. Make sure you discuss any questions you have with your health care provider. Document Released: 03/03/2011 Document Revised: 08/11/2018 Document Reviewed: 08/11/2018 Elsevier Patient Education  2020 [employer]. Migraine Headache A migraine headache is a very strong throbbing pain on one side or both sides of your head. This type of headache can also cause other symptoms. It can last from 4 hours to 3 days. Talk with your doctor about what things may bring on (trigger) this condition. What are the causes? The exact cause of this condition is not known. This condition may be triggered or caused by:  Drinking alcohol.  Smoking.  Taking  medicines, such as: ? Medicine used to treat chest pain (nitroglycerin). ? Birth control pills. ? Estrogen. ? Some blood pressure medicines.  Eating or drinking certain products.  Doing physical activity. Other things that may trigger a migraine headache include:  Having a menstrual period.  Pregnancy.  Hunger.  Stress.  Not getting enough sleep or getting too much sleep.  Weather changes.  Tiredness (fatigue). What increases the risk?  Being 24-33 years old.  Being female.  Having a family history of migraine headaches.  Being Caucasian.  Having depression or anxiety.  Being very overweight. What are the signs or symptoms?  A throbbing pain. This pain may: ? Happen in any area of the head, such as on one side or both sides. ? Make it hard to do daily activities. ? Get worse with physical activity. ? Get worse around bright lights or loud noises.  Other symptoms may include: ? Feeling sick to your stomach (nauseous). ? Vomiting. ? Dizziness. ? Being sensitive to bright lights, loud noises, or smells.  Before you get a migraine headache, you may get warning signs (an aura). An aura may include: ? Seeing flashing lights or having blind spots. ? Seeing bright spots, halos, or zigzag lines. ? Having tunnel vision or blurred vision. ? Having numbness or a tingling feeling. ? Having trouble talking. ? Having weak muscles.  Some people have symptoms after a migraine headache (postdromal phase), such as: ? Tiredness. ? Trouble thinking (concentrating). How is this treated?  Taking medicines that: ? Relieve pain. ? Relieve the feeling of being sick to your stomach. ? Prevent migraine headaches.  Treatment may also include: ? Having acupuncture. ? Avoiding foods that bring on migraine headaches. ? Learning ways to control your body functions (biofeedback). ? Therapy to help you know and deal with negative thoughts (cognitive behavioral therapy). Follow  these instructions at home: Medicines  Take over-the-counter and prescription medicines only as told by your doctor.  Ask your doctor if the medicine prescribed to you: ? Requires you to avoid driving or using heavy machinery. ? Can cause trouble pooping (constipation). You may need to take these steps to prevent or treat trouble pooping:  Drink enough fluid to keep your pee (urine) pale yellow.  Take over-the-counter or prescription medicines.  Eat foods that are high in fiber. These include beans, whole grains, and fresh fruits and vegetables.  Limit foods that are high in fat and sugar. These include fried or sweet foods. Lifestyle  Do not drink alcohol.  Do not use any products that contain nicotine or tobacco, such as cigarettes, e-cigarettes, and chewing tobacco. If you need help quitting, ask your doctor.  Get at least 8 hours of sleep every night.  Limit and deal with stress. General instructions      Keep a journal to find out what may bring  on your migraine headaches. For example, write down: ? What you eat and drink. ? How much sleep you get. ? Any change in what you eat or drink. ? Any change in your medicines.  If you have a migraine headache: ? Avoid things that make your symptoms worse, such as bright lights. ? It may help to lie down in a dark, quiet room. ? Do not drive or use heavy machinery. ? Ask your doctor what activities are safe for you.  Keep all follow-up visits as told by your doctor. This is important. Contact a doctor if:  You get a migraine headache that is different or worse than others you have had.  You have more than 15 headache days in one month. Get help right away if:  Your migraine headache gets very bad.  Your migraine headache lasts longer than 72 hours.  You have a fever.  You have a stiff neck.  You have trouble seeing.  Your muscles feel weak or like you cannot control them.  You start to lose your balance a lot.   You start to have trouble walking.  You pass out (faint).  You have a seizure. Summary  A migraine headache is a very strong throbbing pain on one side or both sides of your head. These headaches can also cause other symptoms.  This condition may be treated with medicines and changes to your lifestyle.  Keep a journal to find out what may bring on your migraine headaches.  Contact a doctor if you get a migraine headache that is different or worse than others you have had.  Contact your doctor if you have more than 15 headache days in a month. This information is not intended to replace advice given to you by your health care provider. Make sure you discuss any questions you have with your health care provider. Document Released: 05/27/2008 Document Revised: 12/10/2018 Document Reviewed: 09/30/2018 Elsevier Patient Education  2020 [employer].

## 2019-07-20 LAB — CMP12+LP+TP+TSH+6AC+CBC/D/PLT
ALT: 72 IU/L — ABNORMAL HIGH (ref 0–32)
AST: 69 IU/L — ABNORMAL HIGH (ref 0–40)
Albumin/Globulin Ratio: 2 (ref 1.2–2.2)
Albumin: 4.5 g/dL (ref 3.9–5.0)
Alkaline Phosphatase: 114 IU/L (ref 39–117)
BUN/Creatinine Ratio: 12 (ref 9–23)
BUN: 10 mg/dL (ref 6–20)
Basophils Absolute: 0.1 10*3/uL (ref 0.0–0.2)
Basos: 1 %
Bilirubin Total: 0.7 mg/dL (ref 0.0–1.2)
Calcium: 10.1 mg/dL (ref 8.7–10.2)
Chloride: 102 mmol/L (ref 96–106)
Chol/HDL Ratio: 4 ratio (ref 0.0–4.4)
Cholesterol, Total: 206 mg/dL — ABNORMAL HIGH (ref 100–199)
Creatinine, Ser: 0.81 mg/dL (ref 0.57–1.00)
EOS (ABSOLUTE): 0.1 10*3/uL (ref 0.0–0.4)
Eos: 2 %
Estimated CHD Risk: 0.9 times avg. (ref 0.0–1.0)
Free Thyroxine Index: 1.7 (ref 1.2–4.9)
GFR calc Af Amer: 113 mL/min/{1.73_m2} (ref >59)
GFR calc non Af Amer: 98 mL/min/{1.73_m2} (ref >59)
GGT: 69 IU/L — ABNORMAL HIGH (ref 0–60)
Globulin, Total: 2.3 g/dL (ref 1.5–4.5)
Glucose: 236 mg/dL — ABNORMAL HIGH (ref 65–99)
HDL: 51 mg/dL (ref >39)
Hematocrit: 43.7 % (ref 34.0–46.6)
Hemoglobin: 14.5 g/dL (ref 11.1–15.9)
Immature Grans (Abs): 0 10*3/uL (ref 0.0–0.1)
Immature Granulocytes: 0 %
Iron: 122 ug/dL (ref 27–159)
LDH: 275 IU/L — ABNORMAL HIGH (ref 119–226)
LDL Chol Calc (NIH): 127 mg/dL — ABNORMAL HIGH (ref 0–99)
Lymphocytes Absolute: 2.3 10*3/uL (ref 0.7–3.1)
Lymphs: 31 %
MCH: 27.1 pg (ref 26.6–33.0)
MCHC: 33.2 g/dL (ref 31.5–35.7)
MCV: 82 fL (ref 79–97)
Monocytes Absolute: 0.4 10*3/uL (ref 0.1–0.9)
Monocytes: 5 %
Neutrophils Absolute: 4.5 10*3/uL (ref 1.4–7.0)
Neutrophils: 61 %
Phosphorus: 3 mg/dL (ref 3.0–4.3)
Platelets: 268 10*3/uL (ref 150–450)
Potassium: 4.2 mmol/L (ref 3.5–5.2)
RBC: 5.35 x10E6/uL — ABNORMAL HIGH (ref 3.77–5.28)
RDW: 12.4 % (ref 11.7–15.4)
Sodium: 140 mmol/L (ref 134–144)
T3 Uptake Ratio: 23 % — ABNORMAL LOW (ref 24–39)
T4, Total: 7.6 ug/dL (ref 4.5–12.0)
TSH: 10.5 u[IU]/mL — ABNORMAL HIGH (ref 0.450–4.500)
Total Protein: 6.8 g/dL (ref 6.0–8.5)
Triglycerides: 155 mg/dL — ABNORMAL HIGH (ref 0–149)
Uric Acid: 8.1 mg/dL — ABNORMAL HIGH (ref 2.5–7.1)
VLDL Cholesterol Cal: 28 mg/dL (ref 5–40)
WBC: 7.3 10*3/uL (ref 3.4–10.8)

## 2019-07-20 LAB — HEMOGLOBIN A1C
Est. average glucose Bld gHb Est-mCnc: 280 mg/dL
Hgb A1c MFr Bld: 11.4 % — ABNORMAL HIGH (ref 4.8–5.6)

## 2019-07-21 ENCOUNTER — Other Ambulatory Visit: Payer: Self-pay | Admitting: Adult Health

## 2019-07-21 MED ORDER — LISINOPRIL 40 MG PO TABS: 40.0000 mg | ORAL_TABLET | Freq: Every day | ORAL | 0 refills | Status: DC

## 2019-07-21 MED ORDER — METFORMIN HCL 500 MG PO TABS: 500.0000 mg | ORAL_TABLET | Freq: Two times a day (BID) | ORAL | 0 refills | Status: DC

## 2019-07-21 MED ORDER — LEVOTHYROXINE SODIUM 50 MCG PO TABS: 50.0000 ug | ORAL_TABLET | Freq: Every day | ORAL | 0 refills | Status: DC

## 2019-07-21 NOTE — Progress Notes (Signed)
  Meds ordered this encounter  Medications  . levothyroxine (SYNTHROID) 50 MCG tablet    Sig: Take 1 tablet (50 mcg total) by mouth daily.    Dispense:  30 tablet    Refill:  0  . lisinopril (ZESTRIL) 40 MG tablet    Sig: Take 1 tablet (40 mg total) by mouth daily.    Dispense:  30 tablet    Refill:  0  . metFORMIN (GLUCOPHAGE) 500 MG tablet    Sig: Take 1 tablet (500 mg total) by mouth 2 (two) times daily with a meal.    Dispense:  60 tablet    Refill:  0   Fasting glucose was elevated at 236. Uric acid mild elevation.  Liver enzymes are elevated, hepatitis panel was negative.  Avoid Tylenol, and alcohol.  Encourage weight loss, exercise at least 30 minutes daily and healthy diet.  Triglycerides should improve with diabetes control and diet changes.  TSH is elevated at 10.5 hypothyroidism will restart Synthroid at her previous dosage.  RBC mild elevation not significant.   Sent the above medications to pharmacy.  She should start her medications and monitor glucose twice daily before meals am and pm.   Recheck in office in one month, bring current readings of glucose.

## 2019-07-21 NOTE — Addendum Note (Signed)
Addended by: Doreen Beam on: 07/21/2019 02:21 PM   Modules accepted: Orders

## 2019-07-26 ENCOUNTER — Telehealth: Payer: Self-pay

## 2019-07-26 LAB — HEPATITIS PANEL, ACUTE
Hep A IgM: NEGATIVE
Hep B C IgM: NEGATIVE
Hep C Virus Ab: <0.1 s/co ratio (ref 0.0–0.9)
Hepatitis B Surface Ag: NEGATIVE

## 2019-07-26 LAB — SPECIMEN STATUS REPORT

## 2019-07-26 NOTE — Addendum Note (Signed)
Addended by: Doreen Beam on: 07/26/2019 08:12 AM   Modules accepted: Orders

## 2019-07-26 NOTE — Progress Notes (Signed)
Please let patient know that her glucose is elevated at 236, her diabetes is out of control her hemoglobin A1c is 11.4.  I will restart her Metformin at her original dose that she had been taking previously.  Her TSH is 10.5 this shows that she has hypothyroid, and she does need to restart her Synthroid at her previous dose.  She has elevated liver enzymes, avoid alcohol, Tylenol.  Also needs to work on cholesterol total cholesterol is 206, triglycerides are elevated at 155 and LDL is 127, we like this to be under 99.A low fat, low cholesterol is recommended.  Her hepatitis panel is negative and not the cause of elevated liver enzymes per test.  Elevated liver enzymes is likely due to fatty liver.  We will continue to monitor.  She should return to the office in 4 to 6 weeks for follow-up visit as well as recheck of liver enzymes, fasting glucose and TSH.  Let me know should she have any questions or concerns.  Provider thoroughly discussed in collaboration above plan with supervising physician Dr. Miguel Aschoff who is in agreement with the care plan as above.  We will hold off on restarting Januvia.

## 2019-07-26 NOTE — Telephone Encounter (Signed)
-----   Message from Doreen Beam, [place] sent at 07/26/2019  8:07 AM EST ----- Please let patient know that her glucose is elevated at 236, her diabetes is out of control her hemoglobin A1c is 11.4.  I will restart her Metformin at her original dose that she had been taking previously.  Her TSH is 10.5 this shows that she has hypothyroid, and she does need to restart her Synthroid at her previous dose.  She has elevated liver enzymes, avoid alcohol, Tylenol.  Also needs to work on cholesterol total cholesterol is 206, triglycerides are elevated at 155 and LDL is 127, we like this to be under 99.A low fat, low cholesterol is recommended.  Her hepatitis panel is negative and not the cause of elevated liver enzymes per test.  Elevated liver enzymes is likely due to fatty liver.  We will continue to monitor.  She should return to the office in 4 to 6 weeks for follow-up visit as well as recheck of liver enzymes, fasting glucose and TSH.  Let me know should she have any questions or concerns.  Provider thoroughly discussed in collaboration above plan with supervising physician Dr. Miguel Aschoff who is in agreement with the care plan as above.  We will hold off on restarting Januvia.

## 2019-07-26 NOTE — Telephone Encounter (Signed)
Patient has been advised. KW 

## 2019-07-27 ENCOUNTER — Other Ambulatory Visit: Payer: Self-pay

## 2019-07-27 ENCOUNTER — Ambulatory Visit (INDEPENDENT_AMBULATORY_CARE_PROVIDER_SITE_OTHER): Payer: Self-pay | Admitting: Obstetrics and Gynecology

## 2019-07-27 ENCOUNTER — Encounter: Payer: Self-pay | Admitting: Obstetrics and Gynecology

## 2019-07-27 VITALS — BP 160/100 | Ht 64.5 in | Wt 305.0 lb

## 2019-07-27 DIAGNOSIS — N926 Irregular menstruation, unspecified: Secondary | ICD-10-CM

## 2019-07-27 DIAGNOSIS — Z3202 Encounter for pregnancy test, result negative: Secondary | ICD-10-CM

## 2019-07-27 DIAGNOSIS — Z01419 Encounter for gynecological examination (general) (routine) without abnormal findings: Secondary | ICD-10-CM

## 2019-07-27 LAB — POCT URINE PREGNANCY: Preg Test, Ur: NEGATIVE

## 2019-07-27 MED ORDER — NORETHINDRONE 0.35 MG PO TABS: 1.0000 | ORAL_TABLET | Freq: Every day | ORAL | 11 refills | Status: DC

## 2019-07-27 NOTE — Patient Instructions (Signed)
[facility], [employer] [facility] [place] Same Day Access Hours:  Monday, Wednesday and Friday, 8am - 3pm Walk-In Crisis Hours: 7 days/wk, 8am - 8pm [address] [place], [place] [address] [phone]  [facility] [phone]  Mobile Crisis Unit [phone]   Therapists/Counselors/Psychologists   Karen [place], [place]  & Jacqlyn Krauss [place]    [phone]        [address]       [place], [place] [address]        Ival Bible, CSW [phone] [address] [place], [place] [address]  Harle Battiest, [place]        [phone]        [address]      [place], [place] [address]        Chyrel Masson, [place] [phone] [address] [place], [place] [address]   Oscar La, LMFT       [phone]        [address]       [place], [place] [address]        Felecia Jan [phone] [address] [place], [place] [address]  Lester [place]        [phone]        [address]       [place], [place] [address]        Kerin Salen [phone] [address] [place], [place] [address]  Tyron Russell, PsyD       [phone]        [address]       [place], [place] [address]        Elita Quick, LPC [phone] [address]  [place], [place] [address]   Debarah Crape        [phone]        [address] [place], [place] [address]         Rosana Hoes [facility] [phone] [email or web address]   Sation Konchella       [phone]        [address]       [place], [place] [address]        Morton [facility] [phone] [email or web address]    Generalized Anxiety Disorder, Adult Generalized anxiety disorder (GAD) is a mental health disorder. People with this condition constantly worry about everyday events. Unlike normal anxiety, worry related to GAD is not triggered by a specific event. These worries also do not  fade or get better with time. GAD interferes with life functions, including relationships, work, and school. GAD can vary from mild to severe. People with severe GAD can have intense waves of anxiety with physical symptoms (panic attacks). What are the causes? The exact cause of GAD is not known. What increases the risk? This condition is more likely to develop in:  Women.  People who have a family history of anxiety disorders.  People who are very shy.  People who experience very stressful life events, such as the death of a loved one.  People who have a very stressful family environment. What are the signs or symptoms? People with GAD often worry excessively about many things in their lives, such as their health and family. They may also be overly concerned about:  Doing well at work.  Being on time.  Natural disasters.  Friendships. Physical symptoms of GAD include:  Fatigue.  Muscle tension or having muscle twitches.  Trembling or feeling shaky.  Being easily startled.  Feeling like your heart is pounding or racing.  Feeling out of breath or like you cannot take a deep breath.  Having trouble falling asleep or staying asleep.  Sweating.  Nausea, diarrhea, or irritable bowel syndrome (IBS).  Headaches.  Trouble concentrating or remembering facts.  Restlessness.  Irritability. How is this diagnosed? Your health care provider can diagnose GAD based on your symptoms and medical history. You will also have a physical exam. The health care provider will ask specific questions about your symptoms, including how severe they are, when they started, and if they come and go. Your health care provider may ask you about your use of alcohol or drugs, including prescription medicines. Your health care provider may refer you to a mental health specialist for further evaluation. Your health care provider will do a thorough examination and may perform additional tests to rule  out other possible causes of your symptoms. To be diagnosed with GAD, a person must have anxiety that:  Is out of his or her control.  Affects several different aspects of his or her life, such as work and relationships.  Causes distress that makes him or her unable to take part in normal activities.  Includes at least three physical symptoms of GAD, such as restlessness, fatigue, trouble concentrating, irritability, muscle tension, or sleep problems. Before your health care provider can confirm a diagnosis of GAD, these symptoms must be present more days than they are not, and they must last for six months or longer. How is this treated? The following therapies are usually used to treat GAD:  Medicine. Antidepressant medicine is usually prescribed for long-term daily control. Antianxiety medicines may be added in severe cases, especially when panic attacks occur.  Talk therapy (psychotherapy). Certain types of talk therapy can be helpful in treating GAD by providing support, education, and guidance. Options include: ? Cognitive behavioral therapy (CBT). People learn coping skills and techniques to ease their anxiety. They learn to identify unrealistic or negative thoughts and behaviors and to replace them with positive ones. ? Acceptance and commitment therapy (ACT). This treatment teaches people how to be mindful as a way to cope with unwanted thoughts and feelings. ? Biofeedback. This process trains you to manage your body's response (physiological response) through breathing techniques and relaxation methods. You will work with a therapist while machines are used to monitor your physical symptoms.  Stress management techniques. These include yoga, meditation, and exercise. A mental health specialist can help determine which treatment is best for you. Some people see improvement with one type of therapy. However, other people require a combination of therapies. Follow these instructions at  home:  Take over-the-counter and prescription medicines only as told by your health care provider.  Try to maintain a normal routine.  Try to anticipate stressful situations and allow extra time to manage them.  Practice any stress management or self-calming techniques as taught by your health care provider.  Do not punish yourself for setbacks or for not making progress.  Try to recognize your accomplishments, even if they are small.  Keep all follow-up visits as told by your health care provider. This is important. Contact a health care provider if:  Your symptoms do not get better.  Your symptoms get worse.  You have signs of depression, such as: ? A persistently sad, cranky, or irritable mood. ? Loss of enjoyment in activities that used to  bring you joy. ? Change in weight or eating. ? Changes in sleeping habits. ? Avoiding friends or family members. ? Loss of energy for normal tasks. ? Feelings of guilt or worthlessness. Get help right away if:  You have serious thoughts about hurting yourself or others. If you ever feel like you may hurt yourself or others, or have thoughts about taking your own life, get help right away. You can go to your nearest emergency department or call:  Your local emergency services (911 in the [place]  A suicide crisis helpline, such as the National Suicide Prevention Lifeline at 91903125241-(321)862-1161. This is open 24 hours a day. Summary  Generalized anxiety disorder (GAD) is a mental health disorder that involves worry that is not triggered by a specific event.  People with GAD often worry excessively about many things in their lives, such as their health and family.  GAD may cause physical symptoms such as restlessness, trouble concentrating, sleep problems, frequent sweating, nausea, diarrhea, headaches, and trembling or muscle twitching.  A mental health specialist can help determine which treatment is best for you. Some people see  improvement with one type of therapy. However, other people require a combination of therapies. This information is not intended to replace advice given to you by your health care provider. Make sure you discuss any questions you have with your health care provider. Document Released: 12/13/2012 Document Revised: 07/31/2017 Document Reviewed: 07/08/2016 Elsevier Patient Education  2020 [employer]  Agoraphobia Agoraphobia is a mental health disorder in which a person fears going out in public places where he or she may feel helpless, trapped, or embarrassed in the event of a panic attack. People with this condition have a fear of losing control during a panic attack, and they often start to avoid the situations that they fear or insist on having another person go with them. Agoraphobia may interfere with normal daily activities and personal relationships. People with severe agoraphobia may become completely homebound and dependent on others for daily tasks, such as grocery shopping and taking care of errands. Agoraphobia is a type of anxiety. It usually begins before age 30, but it can start in older adult years. People with agoraphobia are at risk for other anxiety disorders, depression, and substance abuse. What are the causes? The cause of this condition is not known. A variety of factors such as fear of sensations and emotions in anxiety (anxiety sensitivity), family history of anxiety (genetics), and stressful events may contribute to this condition. What increases the risk? You are more likely to develop this condition if:  You are a woman.  You have a panic disorder.  You have family members with agoraphobia. What are the signs or symptoms? You may have agoraphobia if you have any of the following symptoms for 6 months or longer:  Intense fear arising from two or more of the following: ? Using public transportation, such as cars, buses, planes, trains, or ships. ? Being in open  spaces, such as parking lots, shopping malls, or bridges. ? Being in enclosed spaces, such as shops, theaters, or elevators. ? Standing in line or being in a crowd. ? Being outside the home alone.  Fear of being unable to escape or get help if feared events occur. These events include: ? Panic attack. ? Loss of bowel control in older adults.  Reacting to feared situations by: ? Avoiding them. ? Requiring the presence of a companion. ? Enduring them with intense fear or anxiety.  Fear  or anxiety that is out of proportion to the actual danger that is posed by the event and the situation. How is this diagnosed? This condition is diagnosed based on:  Your symptoms. You will be asked questions about your fears and how they have affected you.  Your medical history and your use of medicines, alcohol, or drugs.  Physical exam and lab tests. These are usually ordered to rule out other problems that may be causing your symptoms. You may be referred to a mental health specialist (psychiatrist or psychologist). How is this treated? This condition is usually treated using a combination of counseling and medicines.  Counseling or talk therapy. Talk therapy is provided by mental health specialists. The following forms of talk therapy can be especially helpful: ? Cognitive behavioral therapy (CBT). CBT helps you to recognize and change unrealistic thoughts and beliefs that contribute to your fears. You will learn that body changes associated with anxiety (such as increased heart rate and breathing) are completely normal and expected. ? Exposure therapy. This type of therapy helps you to face and overcome your fears in a relaxed state and in a safe environment. Exposures are usually approached in a systematic way, starting with situations that provoke less fear and building up to situations that provoke more intense fear. Exposure therapy includes: ? Imagined exposure. You will imagine fearful  situations and expose yourself to them in your mind. ? In vivo exposure. You will face your fears in the real world, such as by standing in a crowded place for a few minutes. ? Interoceptive exposure. In a safe environment, you will practice experiencing body changes that are associated with panic attacks. One example is breathing through a straw to experience breathlessness.  Medicines. The following types of medicines may be helpful: ? Antidepressants. These can decrease general levels of anxiety and can help to prevent panic attacks. ? Benzodiazepines. These medicines block feelings of anxiety and panic. ? Beta-blockers. Beta blockers can reduce physical symptoms of anxiety, such as sweating, tremors, and a racing heart. They may help you to feel less tense and anxious. Follow these instructions at home: Lifestyle  Try to exercise. Get 150 or more minutes of physical activity each week. Also aim to do strengthening exercises two or more times a week.  Eat a healthy diet that includes plenty of vegetables, fruits, whole grains, low-fat dairy products, and lean protein. Do not eat a lot of foods that are high in solid fats, added sugars, or salt (sodium).  Get the right amount and quality of sleep. Most adults need 7-9 hours of sleep each night.  Do not drink alcohol.  Do not use illegal drugs. General instructions  Take over-the-counter and prescription medicines only as told by your health care provider.  Keep all follow-up visits as told by your health care provider. This is important. Where to find more information  For more information, visit the website of the Anxiety and Depression Association of [place] (ADAA): ProgramCam.de Contact a health care provider if:  Your fear or anxiety gets worse.  You have new fears or anxieties. Get help right away if:  You have trouble breathing or have chest pain that you believe may not be part of a panic attack.  You have serious  thoughts about hurting yourself or someone else. If you ever feel like you may hurt yourself or others, or have thoughts about taking your own life, get help right away. You can go to your nearest emergency department or  call:  Your local emergency services (911 in the [place]  A suicide crisis helpline, such as the National Suicide Prevention Lifeline at 774-803-5441. This is open 24 hours a day. Summary  Agoraphobia is a type of anxiety disorder that causes a person to avoid situations that he or she fears, such as being in public or being in crowded spaces.  People with agoraphobia often have panic attacks. They may avoid situations in which they feel escape is difficult or panic attacks are likely to occur.  Agoraphobia is treated with medicines or cognitive behavioral therapy (CBT). This information is not intended to replace advice given to you by your health care provider. Make sure you discuss any questions you have with your health care provider. Document Released: 01/08/2011 Document Revised: 12/07/2018 Document Reviewed: 04/09/2017 Elsevier Patient Education  2020 [employer].

## 2019-07-27 NOTE — Progress Notes (Signed)
Gynecology Annual Exam   PCP: Berniece Pap, FNP  Chief Complaint:  Chief Complaint  Patient presents with  . Gynecologic Exam    irregular periods, wants to be on bc    History of Present Illness: Patient is a 28 y.o. G0P0000 presents for annual exam. The patient has no complaints today.   LMP: No LMP recorded. (Menstrual status: Irregular Periods). Average Interval: irregular, maybe 4 periods a year. They are heavy with painful cramps. Have been irregular for about 6 years.  Duration of flow: several days Heavy Menses: yes Clots: yes Intermenstrual Bleeding: no Postcoital Bleeding: no Dysmenorrhea: yes  The patient is sexually active. She currently uses none for contraception. She denies dyspareunia. She has low sexual desire especially since starting medication for anxiety. She would like to be pregnant but is not sure if she can. The patient does perform self breast exams.  There is no notable family history of breast or ovarian cancer in her family.  The patient wears seatbelts: yes.   The patient has regular exercise: no.    The patient reports current symptoms of depression.    Review of Systems: ROS  Past Medical History:  Past Medical History:  Diagnosis Date  . Agitation   . Allergy   . Anxiety   . Asthma   . Chronic kidney disease   . Depression   . Diabetes mellitus without complication (HCC)   . Hypertension   . Hypothyroidism   . OCD (obsessive compulsive disorder)   . PTSD (post-traumatic stress disorder)   . Renal artery stenosis (HCC)   . Social anxiety disorder   . Social anxiety disorder   . Thyroid disease     Past Surgical History:  Past Surgical History:  Procedure Laterality Date  . CHOLECYSTECTOMY    . LAPAROSCOPIC GASTRIC BANDING    . LAPAROSCOPIC REPAIR AND REMOVAL OF GASTRIC BAND    . TONSILLECTOMY    . WISDOM TOOTH EXTRACTION      Gynecologic History:  No LMP recorded. (Menstrual status: Irregular Periods).  Contraception: none Last Pap: Results were: unknown   Obstetric History: G0P0000  Family History:  Family History  Problem Relation Age of Onset  . Cancer Mother        brain tumor  . Hypertension Mother   . Depression Mother   . Diabetes Mother   . Anxiety disorder Father   . Depression Father   . Hypertension Maternal Grandmother   . Thyroid disease Maternal Grandmother   . Diabetes Maternal Grandmother   . Hypertension Maternal Grandfather   . Cancer Maternal Grandfather        myositis  . High blood pressure Maternal Grandfather   . Diabetes Maternal Grandfather   . Stroke Maternal Grandfather     Social History:  Social History   Socioeconomic History  . Marital status: Married    Spouse name: Not on file  . Number of children: Not on file  . Years of education: Not on file  . Highest education level: Not on file  Occupational History  . Not on file  Social Needs  . Financial resource strain: Not on file  . Food insecurity    Worry: Not on file    Inability: Not on file  . Transportation needs    Medical: Not on file    Non-medical: Not on file  Tobacco Use  . Smoking status: Never Smoker  . Smokeless tobacco: Never Used  Substance and Sexual  Activity  . Alcohol use: Yes    Comment: rarely  . Drug use: No  . Sexual activity: Yes    Birth control/protection: None  Lifestyle  . Physical activity    Days per week: Not on file    Minutes per session: Not on file  . Stress: Not on file  Relationships  . Social Herbalist on phone: Not on file    Gets together: Not on file    Attends religious service: Not on file    Active member of club or organization: Not on file    Attends meetings of clubs or organizations: Not on file    Relationship status: Not on file  . Intimate partner violence    Fear of current or ex partner: Not on file    Emotionally abused: Not on file    Physically abused: Not on file    Forced sexual activity: Not on  file  Other Topics Concern  . Not on file  Social History Narrative  . Not on file    Allergies:  Allergies  Allergen Reactions  . Toradol [Ketorolac Tromethamine] Hives  . Codeine Rash  . Other Rash    Cilantro and Cedar trees  . Pepto-Bismol [Bismuth] Rash  . Tramadol Rash    Medications: Prior to Admission medications   Medication Sig Start Date End Date Taking? Authorizing Provider  albuterol (PROVENTIL HFA;VENTOLIN HFA) 108 (90 Base) MCG/ACT inhaler Inhale 2 puffs into the lungs every 6 (six) hours as needed for wheezing or shortness of breath. 11/02/18  Yes Soyla Dryer, PA-C  busPIRone (BUSPAR) 10 MG tablet Take 10 mg by mouth 2 (two) times daily.   Yes [provider]  levothyroxine (SYNTHROID) 50 MCG tablet Take 1 tablet (50 mcg total) by mouth daily. 07/21/19  Yes Flinchum, Kelby Aline, FNP  lisinopril (ZESTRIL) 40 MG tablet Take 1 tablet (40 mg total) by mouth daily. 07/21/19  Yes Flinchum, Kelby Aline, FNP  metFORMIN (GLUCOPHAGE) 500 MG tablet Take 1 tablet (500 mg total) by mouth 2 (two) times daily with a meal. 07/21/19  Yes Flinchum, Kelby Aline, FNP  norethindrone (MICRONOR) 0.35 MG tablet Take 1 tablet (0.35 mg total) by mouth daily. 07/27/19   Homero Fellers, MD    Physical Exam Vitals: Blood pressure (!) 160/100, height 5' 4.5" (1.638 m), weight (!) 305 lb (138.3 kg).  General: NAD HEENT: normocephalic, anicteric Thyroid: no enlargement, no palpable nodules Pulmonary: No increased work of breathing, CTAB Cardiovascular: RRR, distal pulses 2+ Breast: Deferred due to patient anxiety  Abdomen: NABS, soft, non-tender, non-distended.  Umbilicus without lesions.  No hepatomegaly, splenomegaly or masses palpable. No evidence of hernia  Genitourinary:  External: Normal external female genitalia.  Normal urethral meatus, normal Bartholin's and Skene's glands.    Vagina: Normal vaginal mucosa, no evidence of prolapse.    Cervix: Grossly normal in  appearance, no bleeding  Uterus:  Deferred due to patient anxiety   Adnexa: Deferred due to patient anxiety   Rectal: deferred  Lymphatic: no evidence of inguinal lymphadenopathy Extremities: no edema, erythema, or tenderness Neurologic: Grossly intact Psychiatric: mood appropriate, affect full  Female chaperone present for pelvic and breast  portions of the physical exam    Assessment: 28 y.o. G0P0000 routine annual exam  Plan: Problem List Items Addressed This Visit    None    Visit Diagnoses    Irregular menses    -  Primary   Relevant Medications   norethindrone (  MICRONOR) 0.35 MG tablet   Other Relevant Orders   POCT urine pregnancy (Completed)      2) STI screening  was offered and declined  2)  ASCCP guidelines and rational discussed.  Patient opts for every 5 years screening interval  3) Contraception - the patient is currently using  none.  She is interested in changing to progesterone only pill.  4) Routine healthcare maintenance including cholesterol, diabetes screening discussed managed by PCP  5) Abnormal menstrual cycle and amenorrhea- likely secondary to patient's multiple medical commorbidities such as hypothyroidism, morbid obesity and diabetes. Will need continued medical management to improve these conditions. Pregnancy test negative today. Consider prolactin, LH and androgen testing at next visit.   6) Severe anxiety which is nearly crippling for the patient and limited today's discussion and examination. Patient provided with list of community mental health resources. Has a counselor currently through [facility].   7) Hypertension- recently started on lisinopril.   8) Return in about 3 months (around 10/27/2019) for GYN visit.  Adelene Idlerhristanna  MD [facility], [facility] 07/27/2019 5:23 PM

## 2019-08-04 ENCOUNTER — Encounter: Payer: Self-pay | Admitting: Cardiology

## 2019-08-12 ENCOUNTER — Telehealth: Payer: Self-pay

## 2019-08-12 NOTE — Telephone Encounter (Signed)
Pt is aware her Pap smear came back negative for any abnormalities.

## 2019-08-18 ENCOUNTER — Encounter: Payer: Self-pay | Admitting: Adult Health

## 2019-08-18 ENCOUNTER — Ambulatory Visit (INDEPENDENT_AMBULATORY_CARE_PROVIDER_SITE_OTHER): Payer: Self-pay | Admitting: Adult Health

## 2019-08-18 ENCOUNTER — Other Ambulatory Visit: Payer: Self-pay

## 2019-08-18 VITALS — BP 135/98 | HR 96 | Temp 96.8°F | Resp 16 | Wt 305.6 lb

## 2019-08-18 DIAGNOSIS — Z6841 Body Mass Index (BMI) 40.0 and over, adult: Secondary | ICD-10-CM

## 2019-08-18 DIAGNOSIS — I10 Essential (primary) hypertension: Secondary | ICD-10-CM

## 2019-08-18 DIAGNOSIS — F411 Generalized anxiety disorder: Secondary | ICD-10-CM

## 2019-08-18 DIAGNOSIS — Z8679 Personal history of other diseases of the circulatory system: Secondary | ICD-10-CM

## 2019-08-18 DIAGNOSIS — Z8639 Personal history of other endocrine, nutritional and metabolic disease: Secondary | ICD-10-CM

## 2019-08-18 DIAGNOSIS — E039 Hypothyroidism, unspecified: Secondary | ICD-10-CM

## 2019-08-18 DIAGNOSIS — E1159 Type 2 diabetes mellitus with other circulatory complications: Secondary | ICD-10-CM

## 2019-08-18 DIAGNOSIS — I152 Hypertension secondary to endocrine disorders: Secondary | ICD-10-CM

## 2019-08-18 NOTE — Progress Notes (Signed)
Patient: Leah Bright Female    DOB: Jul 26, 1991   28 y.o.   MRN: [id number] Visit Date: 08/18/2019  Today's Provider: Jairo Ben, FNP   Chief Complaint  Patient presents with  . Diabetes  . Hyperlipidemia  . Hypertension  . Hypothyroidism   Subjective:     HPI  Diabetes Mellitus Type II, Follow-up:   Lab Results  Component Value Date   HGBA1C 11.4 (H) 07/19/2019   HGBA1C 8.5 (H) 10/26/2018   HGBA1C 7.8 (H) 07/05/2018    Last seen for diabetes 1 months ago.  Management since then includes started patient on Metformin  . She reports good compliance with treatment. She is having side effects. Patient complains of gi upset, would like to discuss changing to metformin er.  Current symptoms include nausea and have been new problem. Home blood sugar records: patient reports blood sugar 160 and up  Episodes of hypoglycemia? no   Current insulin regiment: Is not on insulin Most Recent Eye Exam: 07/12/2019 Weight trend: decreasing steadily Prior visit with dietician: No Current exercise: no regular exercise Current diet habits: working on improving  Pertinent Labs:    Component Value Date/Time   CHOL 206 (H) 07/19/2019 1224   TRIG 155 (H) 07/19/2019 1224   HDL 51 07/19/2019 1224   LDLCALC 127 (H) 07/19/2019 1224   CREATININE 0.81 07/19/2019 1224    Wt Readings from Last 3 Encounters:  08/18/19 (!) 305 lb 9.6 oz (138.6 kg)  07/27/19 (!) 305 lb (138.3 kg)  07/19/19 (!) 309 lb 12.8 oz (140.5 kg)    ------------------------------------------------------------------------  Hypothyroid, follow-up:  TSH  Date Value Ref Range Status  07/19/2019 10.500 (H) 0.450 - 4.500 uIU/mL Final  10/26/2018 5.717 (H) 0.350 - 4.500 uIU/mL Final    Comment:    Performed by a 3rd Generation assay with a functional sensitivity of <=0.01 uIU/mL. Performed at [facility], [address] [place], [place] [address]   07/05/2018 12.112 (H) 0.350 - 4.500  uIU/mL Final    Comment:    Performed by a 3rd Generation assay with a functional sensitivity of <=0.01 uIU/mL. Performed at [facility], [address] [place], [place] [address]    Wt Readings from Last 3 Encounters:  08/18/19 (!) 305 lb 9.6 oz (138.6 kg)  07/27/19 (!) 305 lb (138.3 kg)  07/19/19 (!) 309 lb 12.8 oz (140.5 kg)    She was last seen for hypothyroid 1 months ago.  Management since that visit includes starting on Levothyroxine . She reports excellent compliance with treatment. She is not having side effects.  She is not exercising. She is experiencing none She denies change in energy level, diarrhea, heat / cold intolerance, nervousness, palpitations and weight changes Weight trend: decreasing steadily  ------------------------------------------------------------------------  Lipid/Cholesterol, Follow-up:   Last seen for this1 months ago.  Management changes since that visit include advising patient to work on low fat and low cholesterol diet. . Last Lipid Panel:    Component Value Date/Time   CHOL 206 (H) 07/19/2019 1224   TRIG 155 (H) 07/19/2019 1224   HDL 51 07/19/2019 1224   CHOLHDL 4.0 07/19/2019 1224   CHOLHDL 3.9 10/26/2018 1114   VLDL 22 10/26/2018 1114   LDLCALC 127 (H) 07/19/2019 1224    Risk factors for vascular disease include diabetes mellitus, hypercholesterolemia and hypertension   Weight trend: decreasing steadily Prior visit with dietician: no Current diet: not asked Current exercise: no regular exercise  Wt Readings  from Last 3 Encounters:  08/18/19 (!) 305 lb 9.6 oz (138.6 kg)  07/27/19 (!) 305 lb (138.3 kg)  07/19/19 (!) 309 lb 12.8 oz (140.5 kg)   She is starting to exercise with her friend walking.  -------------------------------------------------------------------   Hypertension, follow-up:  BP Readings from Last 3 Encounters:  08/18/19 (!) 135/98  07/27/19 (!) 160/100  07/19/19 (!) 142/97    She was last  seen for hypertension 1 months ago.  BP at that visit was 142/97. Management changes since that visit include starting on Lisinopril 40mg  . She reports excellent compliance with treatment. She is not having side effects.  She is exercising. She is adherent to low salt diet.  . She is experiencing none.  Patient denies chest pain, chest pressure/discomfort, claudication, dyspnea, exertional chest pressure/discomfort, fatigue, irregular heart beat, lower extremity edema, near-syncope, orthopnea, palpitations, paroxysmal nocturnal dyspnea, syncope and tachypnea.   Cardiovascular risk factors include diabetes mellitus, hypertension, obesity (BMI >= 30 kg/m2) and sedentary lifestyle.  Use of agents associated with hypertension: none.     Weight trend: decreasing steadily Wt Readings from Last 3 Encounters:  08/18/19 (!) 305 lb 9.6 oz (138.6 kg)  07/27/19 (!) 305 lb (138.3 kg)  07/19/19 (!) 309 lb 12.8 oz (140.5 kg)     Current diet: not asked Trying to eat healthier, decreasing sodium, and portion sizes.    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 She recently started Propanolol and sees a huge improvement. Denies any homicidal or suicidal ideations or plans ------------------------------------------------------------------------ Patient  denies any fever, body aches,chills, rash, chest pain, shortness of breath, nausea, vomiting, or diarrhea.   Allergies  Allergen Reactions  . Toradol [Ketorolac Tromethamine] Hives  . Codeine Rash  . Other Rash    Cilantro and Cedar trees  . Pepto-Bismol [Bismuth] Rash  . Tramadol Rash     Current Outpatient Medications:  .  albuterol (PROVENTIL HFA;VENTOLIN HFA) 108 (90 Base) MCG/ACT inhaler, Inhale 2 puffs into the lungs every 6 (six) hours as needed for wheezing or shortness of breath., Disp: 1 Inhaler, Rfl: 0 .  busPIRone (BUSPAR) 10 MG tablet, Take 10 mg  by mouth 2 (two) times daily., Disp: , Rfl:  .  levothyroxine (SYNTHROID) 50 MCG tablet, Take 1 tablet (50 mcg total) by mouth daily., Disp: 30 tablet, Rfl: 0 .  lisinopril (ZESTRIL) 40 MG tablet, Take 1 tablet (40 mg total) by mouth daily., Disp: 30 tablet, Rfl: 0 .  metFORMIN (GLUCOPHAGE) 500 MG tablet, Take 1 tablet (500 mg total) by mouth 2 (two) times daily with a meal., Disp: 60 tablet, Rfl: 0 .  norethindrone (MICRONOR) 0.35 MG tablet, Take 1 tablet (0.35 mg total) by mouth daily., Disp: 1 Package, Rfl: 11 .  propranolol (INDERAL) 10 MG tablet, Take 10 mg by mouth 3 (three) times daily., Disp: , Rfl:   Review of Systems  Constitutional: Negative for activity change, appetite change, chills, diaphoresis, fatigue, fever and unexpected weight change.  HENT: Negative.   Respiratory: Negative.   Cardiovascular: Negative.  Negative for chest pain, palpitations and leg swelling.  Gastrointestinal: Positive for diarrhea (none currently has with migraines maybe once a month ). Negative for abdominal distention, abdominal pain, anal bleeding, blood in stool, constipation, nausea (due to anxiety and headaches - none now ), rectal pain and vomiting.  Genitourinary: Negative.        History self reported of renal stenosis - right  Musculoskeletal: Negative for arthralgias, back pain (resolved from previous fall  ), gait problem, joint swelling, myalgias, neck pain and neck stiffness.  Skin: Negative.   Allergic/Immunologic: Positive for environmental allergies.  Neurological: Negative for dizziness (none this time-  has currently with migraine only ), tremors, seizures, syncope, facial asymmetry, speech difficulty, weakness, light-headedness (none presently with migraines only ), numbness and headaches.  Hematological: Negative.   Psychiatric/Behavioral: Negative for agitation (controlled with medications- denies any currently), behavioral problems, confusion, decreased concentration, dysphoric mood,  hallucinations, self-injury, sleep disturbance and suicidal ideas. The patient is nervous/anxious (miuld anxiousness in room ). The patient is not hyperactive.        Reports social anxiety   All other systems reviewed and are negative.   Social History   Tobacco Use  . Smoking status: Never Smoker  . Smokeless tobacco: Never Used  Substance Use Topics  . Alcohol use: Yes    Comment: rarely      Objective:   BP (!) 135/98 (BP Location: Right Wrist, Patient Position: Sitting, Cuff Size: Large)   Pulse 96   Temp (!) 96.8 F (36 C) (Oral)   Resp 16   Wt (!) 305 lb 9.6 oz (138.6 kg)   BMI 51.65 kg/m  Vitals:   08/18/19 1558  BP: (!) 135/98  Pulse: 96  Resp: 16  Temp: (!) 96.8 F (36 C)  TempSrc: Oral  Weight: (!) 305 lb 9.6 oz (138.6 kg)  Body mass index is 51.65 kg/m.   Physical Exam Vitals reviewed.  Constitutional:      General: She is not in acute distress.    Appearance: Normal appearance. She is obese. She is not ill-appearing, toxic-appearing or diaphoretic.  HENT:     Head: Normocephalic and atraumatic.     Jaw: There is normal jaw occlusion.     Salivary Glands: Right salivary gland is not diffusely enlarged or tender. Left salivary gland is not diffusely enlarged or tender.     Right Ear: Tympanic membrane, ear canal and external ear normal. There is impacted cerumen.     Left Ear: Tympanic membrane, ear canal and external ear normal. There is impacted cerumen.     Nose: Nose normal. No congestion or rhinorrhea.     Mouth/Throat:     Mouth: Mucous membranes are dry.     Pharynx: No oropharyngeal exudate or posterior oropharyngeal erythema.  Eyes:     General: Lids are normal. Vision grossly intact. No scleral icterus.       Right eye: No discharge.        Left eye: No discharge.     Extraocular Movements: Extraocular movements intact.     Conjunctiva/sclera: Conjunctivae normal.     Pupils: Pupils are equal, round, and reactive to light.  Neck:      Thyroid: No thyroid mass, thyromegaly or thyroid tenderness.     Trachea: Trachea and phonation normal.  Cardiovascular:     Rate and Rhythm: Normal rate and regular rhythm.     Pulses: Normal pulses.     Heart sounds: Normal heart sounds.  Pulmonary:     Effort: Pulmonary effort is normal. No respiratory distress.     Breath sounds: Normal breath sounds. No stridor. No wheezing, rhonchi or rales.  Chest:     Chest wall: No tenderness.     Comments: deferred breast exam by patient.  Abdominal:     General: Bowel sounds are normal. There is no distension.     Palpations:  Abdomen is soft. There is no mass.     Tenderness: There is no abdominal tenderness. There is no right CVA tenderness, left CVA tenderness, guarding or rebound.     Hernia: No hernia is present.     Comments: Round obese   Genitourinary:    Comments: deferred by patient  Musculoskeletal:     Cervical back: Normal range of motion and neck supple. No rigidity.     Lumbar back: Tenderness (bruised at area of coccyx due to fall last thursday, tender with palpation, hurts to sit) present. No swelling, edema, deformity, lacerations, spasms or bony tenderness. Normal range of motion.       Back:  Lymphadenopathy:     Cervical: No cervical adenopathy.  Skin:    General: Skin is warm and dry.     Capillary Refill: Capillary refill takes less than 2 seconds.  Neurological:     Mental Status: She is alert and oriented to person, place, and time.     Cranial Nerves: No cranial nerve deficit.     Sensory: No sensory deficit.     Motor: No weakness.     Coordination: Coordination normal.     Gait: Gait normal.     Deep Tendon Reflexes: Reflexes normal.  Psychiatric:        Attention and Perception: Attention and perception normal.        Mood and Affect: Affect normal. Mood is anxious (less anxious than past visit. ).        Speech: Speech normal.        Behavior: Behavior normal. Behavior is cooperative.        Thought  Content: Thought content normal. Thought content does not include homicidal or suicidal plan.        Cognition and Memory: Cognition and memory normal.        Judgment: Judgment normal.      No results found for any visits on 08/18/19.     Assessment & Plan    History of stenosis of renal artery - Plan: US Renal Artery Stenosis, CANCELED: Microalbumin, urine  History of hypothyroidism  Hypertension associated with diabetes (HCC) - Plan: CANCELED: Microalbumin, urine  Hypothyroidism, unspecified type  BMI 50.0-59.9, adult (HCC)  Generalized anxiety disorder  History of diabetes mellitus, type II - Plan: POCT UA - Microalbumin Discussed healthy lifestyle, weight loss, diet, exercise.  Medication compliance.   Labs after February 2021 - recheck.  Continue current medications since she had been without them so long. Recheck labs as above. Call with any questions or concerns. Message through Mychart non emergent issues/ questions.   Keep psychiatric appointments.  Return in about 2 months (around 10/19/2019), or if symptoms worsen or fail to improve, for Go to Emergency room/ urgent care if worse, at any time for any worsening symptoms.     Advised patient call the office or your primary care doctor for an appointment if no improvement within 72 hours or if any symptoms change or worsen at any time  Advised ER or urgent Care if after hours or on weekend. Call 911 for emergency symptoms at any time.Patinet verbalized understanding of all instructions given/reviewed and treatment plan and has no further questions or concerns at this time.    The entirety of the information documented in the History of Present Illness, Review of Systems and Physical Exam were personally obtained by me. Portions of this information were initially documented by the  Certified Medical Assistant whose name is documented  in Epic and reviewed by me for thoroughness and accuracy.  I have personally performed  the exam and reviewed the chart and it is accurate to the best of my knowledge.  Haematologist has been used and any errors in dictation or transcription are unintentional.  Kelby Aline. [place], [facility]

## 2019-08-18 NOTE — Patient Instructions (Addendum)
Labs after 10/22/19  Diabetes Mellitus and Exercise Exercising regularly is important for your overall health, especially when you have diabetes (diabetes mellitus). Exercising is not only about losing weight. It has many other health benefits, such as increasing muscle strength and bone density and reducing body fat and stress. This leads to improved fitness, flexibility, and endurance, all of which result in better overall health. Exercise has additional benefits for people with diabetes, including:  Reducing appetite.  Helping to lower and control blood glucose.  Lowering blood pressure.  Helping to control amounts of fatty substances (lipids) in the blood, such as cholesterol and triglycerides.  Helping the body to respond better to insulin (improving insulin sensitivity).  Reducing how much insulin the body needs.  Decreasing the risk for heart disease by: ? Lowering cholesterol and triglyceride levels. ? Increasing the levels of good cholesterol. ? Lowering blood glucose levels. What is my activity plan? Your health care provider or certified diabetes educator can help you make a plan for the type and frequency of exercise (activity plan) that works for you. Make sure that you:  Do at least 150 minutes of moderate-intensity or vigorous-intensity exercise each week. This could be brisk walking, biking, or water aerobics. ? Do stretching and strength exercises, such as yoga or weightlifting, at least 2 times a week. ? Spread out your activity over at least 3 days of the week.  Get some form of physical activity every day. ? Do not go more than 2 days in a row without some kind of physical activity. ? Avoid being inactive for more than 30 minutes at a time. Take frequent breaks to walk or stretch.  Choose a type of exercise or activity that you enjoy, and set realistic goals.  Start slowly, and gradually increase the intensity of your exercise over time. What do I need to know  about managing my diabetes?   Check your blood glucose before and after exercising. ? If your blood glucose is 240 mg/dL (13.3 mmol/L) or higher before you exercise, check your urine for ketones. If you have ketones in your urine, do not exercise until your blood glucose returns to normal. ? If your blood glucose is 100 mg/dL (5.6 mmol/L) or lower, eat a snack containing 15-20 grams of carbohydrate. Check your blood glucose 15 minutes after the snack to make sure that your level is above 100 mg/dL (5.6 mmol/L) before you start your exercise.  Know the symptoms of low blood glucose (hypoglycemia) and how to treat it. Your risk for hypoglycemia increases during and after exercise. Common symptoms of hypoglycemia can include: ? Hunger. ? Anxiety. ? Sweating and feeling clammy. ? Confusion. ? Dizziness or feeling light-headed. ? Increased heart rate or palpitations. ? Blurry vision. ? Tingling or numbness around the mouth, lips, or tongue. ? Tremors or shakes. ? Irritability.  Keep a rapid-acting carbohydrate snack available before, during, and after exercise to help prevent or treat hypoglycemia.  Avoid injecting insulin into areas of the body that are going to be exercised. For example, avoid injecting insulin into: ? The arms, when playing tennis. ? The legs, when jogging.  Keep records of your exercise habits. Doing this can help you and your health care provider adjust your diabetes management plan as needed. Write down: ? Food that you eat before and after you exercise. ? Blood glucose levels before and after you exercise. ? The type and amount of exercise you have done. ? When your insulin is expected to  peak, if you use insulin. Avoid exercising at times when your insulin is peaking.  When you start a new exercise or activity, work with your health care provider to make sure the activity is safe for you, and to adjust your insulin, medicines, or food intake as needed.  Drink  plenty of water while you exercise to prevent dehydration or heat stroke. Drink enough fluid to keep your urine clear or pale yellow. Summary  Exercising regularly is important for your overall health, especially when you have diabetes (diabetes mellitus).  Exercising has many health benefits, such as increasing muscle strength and bone density and reducing body fat and stress.  Your health care provider or certified diabetes educator can help you make a plan for the type and frequency of exercise (activity plan) that works for you.  When you start a new exercise or activity, work with your health care provider to make sure the activity is safe for you, and to adjust your insulin, medicines, or food intake as needed. This information is not intended to replace advice given to you by your health care provider. Make sure you discuss any questions you have with your health care provider. Document Released: 11/08/2003 Document Revised: 03/12/2017 Document Reviewed: 01/28/2016 Elsevier Patient Education  2020 [employer].  Managing Your Hypertension Hypertension is commonly called high blood pressure. This is when the force of your blood pressing against the walls of your arteries is too strong. Arteries are blood vessels that carry blood from your heart throughout your body. Hypertension forces the heart to work harder to pump blood, and may cause the arteries to become narrow or stiff. Having untreated or uncontrolled hypertension can cause heart attack, stroke, kidney disease, and other problems. What are blood pressure readings? A blood pressure reading consists of a higher number over a lower number. Ideally, your blood pressure should be below 120/80. The first ("top") number is called the systolic pressure. It is a measure of the pressure in your arteries as your heart beats. The second ("bottom") number is called the diastolic pressure. It is a measure of the pressure in your arteries as the  heart relaxes. What does my blood pressure reading mean? Blood pressure is classified into four stages. Based on your blood pressure reading, your health care provider may use the following stages to determine what type of treatment you need, if any. Systolic pressure and diastolic pressure are measured in a unit called mm Hg. Normal  Systolic pressure: below 120.  Diastolic pressure: below 80. Elevated  Systolic pressure: 120-129.  Diastolic pressure: below 80. Hypertension stage 1  Systolic pressure: 130-139.  Diastolic pressure: 80-89. Hypertension stage 2  Systolic pressure: 140 or above.  Diastolic pressure: 90 or above. What health risks are associated with hypertension? Managing your hypertension is an important responsibility. Uncontrolled hypertension can lead to:  A heart attack.  A stroke.  A weakened blood vessel (aneurysm).  Heart failure.  Kidney damage.  Eye damage.  Metabolic syndrome.  Memory and concentration problems. What changes can I make to manage my hypertension? Hypertension can be managed by making lifestyle changes and possibly by taking medicines. Your health care provider will help you make a plan to bring your blood pressure within a normal range. Eating and drinking   Eat a diet that is high in fiber and potassium, and low in salt (sodium), added sugar, and fat. An example eating plan is called the DASH (Dietary Approaches to Stop Hypertension) diet. To eat this  way: ? Eat plenty of fresh fruits and vegetables. Try to fill half of your plate at each meal with fruits and vegetables. ? Eat whole grains, such as whole wheat pasta, brown rice, or whole grain bread. Fill about one quarter of your plate with whole grains. ? Eat low-fat diary products. ? Avoid fatty cuts of meat, processed or cured meats, and poultry with skin. Fill about one quarter of your plate with lean proteins such as fish, chicken without skin, beans, eggs, and  tofu. ? Avoid premade and processed foods. These tend to be higher in sodium, added sugar, and fat.  Reduce your daily sodium intake. Most people with hypertension should eat less than 1,500 mg of sodium a day.  Limit alcohol intake to no more than 1 drink a day for nonpregnant women and 2 drinks a day for men. One drink equals 12 oz of beer, 5 oz of wine, or 1 oz of hard liquor. Lifestyle  Work with your health care provider to maintain a healthy body weight, or to lose weight. Ask what an ideal weight is for you.  Get at least 30 minutes of exercise that causes your heart to beat faster (aerobic exercise) most days of the week. Activities may include walking, swimming, or biking.  Include exercise to strengthen your muscles (resistance exercise), such as weight lifting, as part of your weekly exercise routine. Try to do these types of exercises for 30 minutes at least 3 days a week.  Do not use any products that contain nicotine or tobacco, such as cigarettes and e-cigarettes. If you need help quitting, ask your health care provider.  Control any long-term (chronic) conditions you have, such as high cholesterol or diabetes. Monitoring  Monitor your blood pressure at home as told by your health care provider. Your personal target blood pressure may vary depending on your medical conditions, your age, and other factors.  Have your blood pressure checked regularly, as often as told by your health care provider. Working with your health care provider  Review all the medicines you take with your health care provider because there may be side effects or interactions.  Talk with your health care provider about your diet, exercise habits, and other lifestyle factors that may be contributing to hypertension.  Visit your health care provider regularly. Your health care provider can help you create and adjust your plan for managing hypertension. Will I need medicine to control my blood  pressure? Your health care provider may prescribe medicine if lifestyle changes are not enough to get your blood pressure under control, and if:  Your systolic blood pressure is 130 or higher.  Your diastolic blood pressure is 80 or higher. Take medicines only as told by your health care provider. Follow the directions carefully. Blood pressure medicines must be taken as prescribed. The medicine does not work as well when you skip doses. Skipping doses also puts you at risk for problems. Contact a health care provider if:  You think you are having a reaction to medicines you have taken.  You have repeated (recurrent) headaches.  You feel dizzy.  You have swelling in your ankles.  You have trouble with your vision. Get help right away if:  You develop a severe headache or confusion.  You have unusual weakness or numbness, or you feel faint.  You have severe pain in your chest or abdomen.  You vomit repeatedly.  You have trouble breathing. Summary  Hypertension is when the force of blood  pumping through your arteries is too strong. If this condition is not controlled, it may put you at risk for serious complications.  Your personal target blood pressure may vary depending on your medical conditions, your age, and other factors. For most people, a normal blood pressure is less than 120/80.  Hypertension is managed by lifestyle changes, medicines, or both. Lifestyle changes include weight loss, eating a healthy, low-sodium diet, exercising more, and limiting alcohol. This information is not intended to replace advice given to you by your health care provider. Make sure you discuss any questions you have with your health care provider. Document Released: 05/12/2012 Document Revised: 12/10/2018 Document Reviewed: 07/16/2016 Elsevier Patient Education  2020 [employer].  Hypertension, Adult Hypertension is another name for high blood pressure. High blood pressure forces your heart  to work harder to pump blood. This can cause problems over time. There are two numbers in a blood pressure reading. There is a top number (systolic) over a bottom number (diastolic). It is best to have a blood pressure that is below 120/80. Healthy choices can help lower your blood pressure, or you may need medicine to help lower it. What are the causes? The cause of this condition is not known. Some conditions may be related to high blood pressure. What increases the risk?  Smoking.  Having type 2 diabetes mellitus, high cholesterol, or both.  Not getting enough exercise or physical activity.  Being overweight.  Having too much fat, sugar, calories, or salt (sodium) in your diet.  Drinking too much alcohol.  Having long-term (chronic) kidney disease.  Having a family history of high blood pressure.  Age. Risk increases with age.  Race. You may be at higher risk if you are African American.  Gender. Men are at higher risk than women before age 5. After age 25, women are at higher risk than men.  Having obstructive sleep apnea.  Stress. What are the signs or symptoms?  High blood pressure may not cause symptoms. Very high blood pressure (hypertensive crisis) may cause: ? Headache. ? Feelings of worry or nervousness (anxiety). ? Shortness of breath. ? Nosebleed. ? A feeling of being sick to your stomach (nausea). ? Throwing up (vomiting). ? Changes in how you see. ? Very bad chest pain. ? Seizures. How is this treated?  This condition is treated by making healthy lifestyle changes, such as: ? Eating healthy foods. ? Exercising more. ? Drinking less alcohol.  Your health care provider may prescribe medicine if lifestyle changes are not enough to get your blood pressure under control, and if: ? Your top number is above 130. ? Your bottom number is above 80.  Your personal target blood pressure may vary. Follow these instructions at home: Eating and  drinking   If told, follow the DASH eating plan. To follow this plan: ? Fill one half of your plate at each meal with fruits and vegetables. ? Fill one fourth of your plate at each meal with whole grains. Whole grains include whole-wheat pasta, brown rice, and whole-grain bread. ? Eat or drink low-fat dairy products, such as skim milk or low-fat yogurt. ? Fill one fourth of your plate at each meal with low-fat (lean) proteins. Low-fat proteins include fish, chicken without skin, eggs, beans, and tofu. ? Avoid fatty meat, cured and processed meat, or chicken with skin. ? Avoid pre-made or processed food.  Eat less than 1,500 mg of salt each day.  Do not drink alcohol if: ? Your doctor  tells you not to drink. ? You are pregnant, may be pregnant, or are planning to become pregnant.  If you drink alcohol: ? Limit how much you use to:  0-1 drink a day for women.  0-2 drinks a day for men. ? Be aware of how much alcohol is in your drink. In the [place] one drink equals one 12 oz bottle of beer (355 mL), one 5 oz glass of wine (148 mL), or one 1 oz glass of hard liquor (44 mL). Lifestyle   Work with your doctor to stay at a healthy weight or to lose weight. Ask your doctor what the best weight is for you.  Get at least 30 minutes of exercise most days of the week. This may include walking, swimming, or biking.  Get at least 30 minutes of exercise that strengthens your muscles (resistance exercise) at least 3 days a week. This may include lifting weights or doing Pilates.  Do not use any products that contain nicotine or tobacco, such as cigarettes, e-cigarettes, and chewing tobacco. If you need help quitting, ask your doctor.  Check your blood pressure at home as told by your doctor.  Keep all follow-up visits as told by your doctor. This is important. Medicines  Take over-the-counter and prescription medicines only as told by your doctor. Follow directions carefully.  Do not skip  doses of blood pressure medicine. The medicine does not work as well if you skip doses. Skipping doses also puts you at risk for problems.  Ask your doctor about side effects or reactions to medicines that you should watch for. Contact a doctor if you:  Think you are having a reaction to the medicine you are taking.  Have headaches that keep coming back (recurring).  Feel dizzy.  Have swelling in your ankles.  Have trouble with your vision. Get help right away if you:  Get a very bad headache.  Start to feel mixed up (confused).  Feel weak or numb.  Feel faint.  Have very bad pain in your: ? Chest. ? Belly (abdomen).  Throw up more than once.  Have trouble breathing. Summary  Hypertension is another name for high blood pressure.  High blood pressure forces your heart to work harder to pump blood.  For most people, a normal blood pressure is less than 120/80.  Making healthy choices can help lower blood pressure. If your blood pressure does not get lower with healthy choices, you may need to take medicine. This information is not intended to replace advice given to you by your health care provider. Make sure you discuss any questions you have with your health care provider. Document Released: 02/04/2008 Document Revised: 04/28/2018 Document Reviewed: 04/28/2018 Elsevier Patient Education  2020 [employer].

## 2019-08-22 LAB — POCT UA - MICROALBUMIN: Microalbumin Ur, POC: 50 mg/L

## 2019-08-31 ENCOUNTER — Encounter: Payer: Self-pay | Admitting: Adult Health

## 2019-08-31 ENCOUNTER — Other Ambulatory Visit: Payer: Self-pay | Admitting: Adult Health

## 2019-08-31 MED ORDER — LEVOTHYROXINE SODIUM 50 MCG PO TABS: 50.0000 ug | ORAL_TABLET | Freq: Every day | ORAL | 0 refills | Status: DC

## 2019-08-31 MED ORDER — LISINOPRIL 40 MG PO TABS: 40.0000 mg | ORAL_TABLET | Freq: Every day | ORAL | 0 refills | Status: DC

## 2019-09-07 ENCOUNTER — Other Ambulatory Visit: Payer: Self-pay | Admitting: Adult Health

## 2019-09-07 ENCOUNTER — Ambulatory Visit
Admission: RE | Admit: 2019-09-07 | Discharge: 2019-09-07 | Disposition: A | Discharge status: Home / Self Care | Payer: Self-pay | Source: Ambulatory Visit | Attending: Adult Health | Admitting: Adult Health

## 2019-09-07 ENCOUNTER — Other Ambulatory Visit: Payer: Self-pay

## 2019-09-07 DIAGNOSIS — I701 Atherosclerosis of renal artery: Secondary | ICD-10-CM

## 2019-09-07 DIAGNOSIS — Z8679 Personal history of other diseases of the circulatory system: Secondary | ICD-10-CM

## 2019-09-07 DIAGNOSIS — R93429 Abnormal radiologic findings on diagnostic imaging of unspecified kidney: Secondary | ICD-10-CM

## 2019-09-07 DIAGNOSIS — N261 Atrophy of kidney (terminal): Secondary | ICD-10-CM

## 2019-09-07 HISTORY — DX: Atrophy of kidney (terminal): N26.1

## 2019-09-07 HISTORY — DX: Atherosclerosis of renal artery: I70.1

## 2019-09-07 MED ORDER — AMLODIPINE BESYLATE 10 MG PO TABS: 10.0000 mg | ORAL_TABLET | Freq: Every day | ORAL | 0 refills | Status: DC

## 2019-09-07 NOTE — Progress Notes (Unsigned)
Discontinued Lisinopril.  Started Amlodipine 10 mg once daily by mouth.  referral to CCM social work for [employer]. Refer to nephrology as well as vascular for further work up and evaluation after abnormal renal ultrasound with doppler performed today 09/07/19.     Discussed with back up supervising physician  Vella Kohler, MD, MPH who reviewed and is in agreement with plan and see phone call note.

## 2019-09-07 NOTE — Progress Notes (Signed)
  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 She needs to Discontinue her Lisinopril 40 mg now as this drug is not recommended with these results. I will start her on Amlodipine 10 mg take one tablet by mouth daily for hypertension and monitor blood pressure. Avoid NSAIS's such as ibuprofen, aleve and advil.  She will need a CT scan after /referral and evaluation by nephrology and by vascular. I will also refer her to care management social work to see if they can help with any assistance since she is self pay. Let me know if she is ok with proceeding with the referrals and social worker contacting her as well as where she would like the new blood pressure medication sent.    IMPRESSION: Limited exam as above.  Known chronic right renal atrophy. Renal ultrasound returned shows known chronic right renal atrophy without any obstruction or enlargement. Ultrasound also shows  some degree of possible mild left renal artery s Elevated left renal artery peak systolic velocities indicative of some degree of stenosis. Consider further evaluation with CTA abdomen given her normal creatinine.   Electronically Signed   By: Judie Petit.  Shick M.D.   On: 09/07/2019 12:22

## 2019-09-07 NOTE — Progress Notes (Signed)
Ok all referrals placed, Did she also verbalize understanding of Lisinopril being discontinued and Amlodipine being started ?

## 2019-09-08 ENCOUNTER — Telehealth: Payer: Self-pay | Admitting: Adult Health

## 2019-09-08 NOTE — Telephone Encounter (Signed)
Can provider please close notes so that I can send referral? Thanks

## 2019-09-08 NOTE — Telephone Encounter (Signed)
Please see message below. KW 

## 2019-09-08 NOTE — Telephone Encounter (Signed)
I show my note is closed and signed. Let me know if otherwise.  Thanks, Marvell Fuller MSN, AGNP-C, FNP-C

## 2019-09-15 ENCOUNTER — Other Ambulatory Visit: Payer: Self-pay

## 2019-09-15 ENCOUNTER — Ambulatory Visit: Payer: Self-pay | Admitting: Neurology

## 2019-09-15 ENCOUNTER — Encounter: Payer: Self-pay | Admitting: Neurology

## 2019-09-15 VITALS — BP 148/86 | HR 76 | Temp 97.7°F | Ht 65.5 in | Wt 305.0 lb

## 2019-09-15 DIAGNOSIS — G43009 Migraine without aura, not intractable, without status migrainosus: Secondary | ICD-10-CM

## 2019-09-15 MED ORDER — ONDANSETRON 4 MG PO TBDP: 4.0000 mg | ORAL_TABLET | Freq: Three times a day (TID) | ORAL | 11 refills | Status: DC | PRN

## 2019-09-15 MED ORDER — BUTALBITAL-APAP-CAFFEINE 50-325-40 MG PO TABS: 1.0000 | ORAL_TABLET | Freq: Four times a day (QID) | ORAL | 4 refills | Status: DC | PRN

## 2019-09-15 NOTE — Progress Notes (Signed)
[facility]    Provider:  Dr Lucia Gaskins Requesting Provider: Stephanie Acre* Primary Care Provider:  Berniece Pap, FNP  CC:  migraines  HPI:  Leah Bright is a 29 y.o. female here as requested by Berniece Pap, F* for migraines  Past medical history anxiety, depression, migraines.  I reviewed patient's epic chart and notes, I did find emergency room notes from 2018 at [facility] when she presented with complaints of headaches, stating she has a history of migraines.  [place] Fioricet and her current headache had been going on for 5 days.  She complained of nausea, photophobia and right sided posterior headache.  At that time her headache was a 10 out of 10, patient was able to drive herself to the emergency department.  Physical exam as well as neurologic exam were normal, no gross focal neurologic deficits appreciated.  Patient appeared in no acute distress, she was noted to be photophobic.  She was given a prescription of Fioricet and discharge.  I was also able to look in care everywhere and find another incident of the emergency room in April 2017 with headache; patient was seen at [facility] emergency center, migraine started and patient took oxycodone, complained of everything spinning, was seen in the emergency room for the same complaint the night prior and had a head CT and blood work.  I reviewed CT of the head and CT angio of the head at that time which were both negative for acute intracranial abnormalities, in fact the findings for CT of the head and CTA of the intracranial structures appear normal, no abnormalities stated on reports.  Migraines started years go, FHx in mother. She has a lot of noise sensitivity, feels like "slushy" inside her head, putting something tight around the head like a tight bandana as tight as she can make it feels a lot  better. No hearing changes, no pulsatile tinnitus, always starts on th right I the back of the head. Feels like a headache at first then slowly progresses, worsens, can be 10/10, she gets nausea and vomiting. Laying still on the couch helps. She gets them 2-3x a month and can last 24 hours. The other days she is headache free. She takes extra strength excedrin that helps a little bit. She used to be on Fioricet and that helped a lot. She knows about [employer] assistance. No vision changes, no aura. No changes in quality, frequency, severity, not positional, not exertional. No other focal neurologic deficits, associated symptoms, inciting events or modifiable factors.  Reviewed notes, labs and imaging from outside physicians, which showed: see above  Review of Systems: Patient complains of symptoms per HPI as well as the following symptoms: headache, insomnia, dizziness, depression, anxiety. Pertinent negatives and positives per HPI. All others negative.   Social History   Socioeconomic History  . Marital status: Married    Spouse name: Not on file  . Number of children: 2  . Years of education: Not on file  . Highest education level: Some college, no degree  Occupational History  . Not on file  Tobacco Use  . Smoking status: Never Smoker  . Smokeless tobacco: Never Used  Substance and Sexual Activity  . Alcohol use: Yes    Comment: rarely, maybe 1 shot every 2-3 months  . Drug use: Never  . Sexual activity: Yes  Birth control/protection: Pill  Other Topics Concern  . Not on file  Social History Narrative   Lives at home with husband & kids   Right handed   Caffeine: 2x weekly   Social Determinants of Health   Financial Resource Strain:   . Difficulty of Paying Living Expenses: Not on file  Food Insecurity:   . Worried About Programme researcher, broadcasting/film/video in the Last Year: Not on file  . Ran Out of Food in the Last Year: Not on file  Transportation Needs:   . Lack of  Transportation (Medical): Not on file  . Lack of Transportation (Non-Medical): Not on file  Physical Activity:   . Days of Exercise per Week: Not on file  . Minutes of Exercise per Session: Not on file  Stress:   . Feeling of Stress : Not on file  Social Connections:   . Frequency of Communication with Friends and Family: Not on file  . Frequency of Social Gatherings with Friends and Family: Not on file  . Attends Religious Services: Not on file  . Active Member of Clubs or Organizations: Not on file  . Attends Banker Meetings: Not on file  . Marital Status: Not on file  Intimate Partner Violence:   . Fear of Current or Ex-Partner: Not on file  . Emotionally Abused: Not on file  . Physically Abused: Not on file  . Sexually Abused: Not on file    Family History  Problem Relation Age of Onset  . Cancer Mother        brain tumor  . Hypertension Mother   . Depression Mother   . Diabetes Mother   . Migraines Mother        benign tumor, still gets headaches sometimes now   . Anxiety disorder Father   . Depression Father   . Hypertension Maternal Grandmother   . Thyroid disease Maternal Grandmother   . Diabetes Maternal Grandmother   . Hypertension Maternal Grandfather   . Cancer Maternal Grandfather        myositis  . High blood pressure Maternal Grandfather   . Diabetes Maternal Grandfather   . Stroke Maternal Grandfather     Past Medical History:  Diagnosis Date  . Agitation   . Allergy   . Anxiety   . Asthma   . Chronic kidney disease   . Depression   . Diabetes mellitus without complication (HCC)   . High cholesterol   . Hypertension   . Hypothyroidism   . Left renal artery stenosis (HCC) 09/07/2019  . Migraine   . OCD (obsessive compulsive disorder)   . PTSD (post-traumatic stress disorder)   . Renal artery stenosis (HCC)   . Right renal atrophy   . Social anxiety disorder   . Social anxiety disorder   . Social phobia   . Thyroid disease       Patient Active Problem List   Diagnosis Date Noted  . Migraine without aura and without status migrainosus, not intractable 09/18/2019  . Hypothyroidism 08/02/2018  . Essential hypertension 08/02/2018  . Morbid obesity (HCC) 08/02/2018  . Type 2 diabetes mellitus without complication, without long-term current use of insulin (HCC) 08/02/2018  . 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  . Renal artery stenosis (HCC) 03/02/2018  . Asthma exacerbation 09/28/2015  . CAP (community acquired pneumonia) 09/27/2015  . Hypokalemia 09/27/2015  . Hypomagnesemia 09/27/2015  . Hypoxia 09/27/2015  . Sepsis due to pneumonia (HCC) 09/27/2015  . Essential hypertension  09/27/2015  . Abdominal pain 01/22/2013  . Diarrhea 01/22/2013  . Rash 01/22/2013    Past Surgical History:  Procedure Laterality Date  . CHOLECYSTECTOMY  2008  . endoscopy/colonoscopy   2011  . LAPAROSCOPIC GASTRIC BANDING  2007  . LAPAROSCOPIC REPAIR AND REMOVAL OF GASTRIC BAND  2011  . TONSILLECTOMY  1994  . WISDOM TOOTH EXTRACTION      Current Outpatient Medications  Medication Sig Dispense Refill  . albuterol (PROVENTIL HFA;VENTOLIN HFA) 108 (90 Base) MCG/ACT inhaler Inhale 2 puffs into the lungs every 6 (six) hours as needed for wheezing or shortness of breath. 1 Inhaler 0  . amLODipine (NORVASC) 10 MG tablet Take 1 tablet (10 mg total) by mouth daily. 30 tablet 0  . busPIRone (BUSPAR) 5 MG tablet Take 5 mg by mouth 2 (two) times daily.    [name]. levothyroxine (SYNTHROID) 50 MCG tablet Take 1 tablet (50 mcg total) by mouth daily. 30 tablet 0  . metFORMIN (GLUCOPHAGE) 500 MG tablet Take 1 tablet (500 mg total) by mouth 2 (two) times daily with a meal. 60 tablet 0  . norethindrone (MICRONOR) 0.35 MG tablet Take 1 tablet (0.35 mg total) by mouth daily. 1 Package 11  . propranolol (INDERAL) 10 MG tablet Take 10 mg by mouth 3 (three) times daily as needed.     . traZODone (DESYREL) 100 MG tablet Take 100 mg by mouth  at bedtime as needed for sleep.    [name]. venlafaxine XR (EFFEXOR-XR) 75 MG 24 hr capsule Take 75 mg by mouth at bedtime.    . butalbital-acetaminophen-caffeine (FIORICET) 50-325-40 MG tablet Take 1 tablet by mouth every 6 (six) hours as needed for headache. 10 tablet 4  . ondansetron (ZOFRAN-ODT) 4 MG disintegrating tablet Take 1-2 tablets (4-8 mg total) by mouth every 8 (eight) hours as needed for nausea. 30 tablet 11   No current facility-administered medications for this visit.    Allergies as of 09/15/2019 - Review Complete 09/15/2019  Allergen Reaction Noted  . Toradol [ketorolac tromethamine] Hives 04/11/2016  . Codeine Rash 04/11/2016  . Other Rash 07/19/2019  . Pepto-bismol [bismuth] Rash 04/11/2016  . Tramadol Rash 07/19/2019    Vitals: BP (!) 148/86 (BP Location: Right Arm, Patient Position: Sitting, Cuff Size: Large) Comment: pt anxious  Pulse 76   Temp 97.7 F (36.5 C) Comment: taken at front  Ht 5' 5.5" (1.664 m)   Wt (!) 305 lb (138.3 kg)   BMI 49.98 kg/m  Last Weight:  Wt Readings from Last 1 Encounters:  09/15/19 (!) 305 lb (138.3 kg)   Last Height:   Ht Readings from Last 1 Encounters:  09/15/19 5' 5.5" (1.664 m)     Physical exam: Exam: Gen: NAD, conversant, well nourised, obese, well groomed                     CV: RRR, no MRG. No Carotid Bruits. No peripheral edema, warm, nontender Eyes: Conjunctivae clear without exudates or hemorrhage  Neuro: Detailed Neurologic Exam  Speech:    Speech is normal; fluent and spontaneous with normal comprehension.  Cognition:    The patient is oriented to person, place, and time;     recent and remote memory intact;     language fluent;     normal attention, concentration,     fund of knowledge Cranial Nerves:    The pupils are equal, round, and reactive to light. The fundi are normal and spontaneous venous pulsations are present. Visual  fields are full to finger confrontation. Extraocular movements are intact.  Trigeminal sensation is intact and the muscles of mastication are normal. The face is symmetric. The palate elevates in the midline. Hearing intact. Voice is normal. Shoulder shrug is normal. The tongue has normal motion without fasciculations.   Coordination:    Normal finger to nose and heel to shin. Normal rapid alternating movements.   Gait:    Slightly wide based and lordotic likely due to large body habitus  Motor Observation:    No asymmetry, no atrophy, and no involuntary movements noted. Tone:    Normal muscle tone.    Posture:    Posture is normal. normal erect    Strength:    Strength is V/V in the upper and lower limbs.      Sensation: intact to LT     Reflex Exam:  DTR's:    Deep tendon reflexes in the upper and lower extremities are normal bilaterally.   Toes:    The toes are downgoing bilaterally.   Clonus:    Clonus is absent.    Assessment/Plan: This is a 29 year old with episodic migraines, no red flags, no recent changes in quality, severity, frequency.  She states that Fioricet in the past has helped her, and although using Fioricet for migraine management is controversial I will prescribe this for patients and she cannot use triptans, as long as she limits use to no more than 10 times a month absolute maximum. she has renal stenosis so will not try triptans. Zofran helps a lot we can order that as well. Could not visuzlize fundi but she has been to the eye doctor for full exam a month or 2 ago and everything was fine per report, continue regular evaluation by eye doctor and primary care.   Meds ordered this encounter  Medications  . butalbital-acetaminophen-caffeine (FIORICET) 50-325-40 MG tablet    Sig: Take 1 tablet by mouth every 6 (six) hours as needed for headache.    Dispense:  10 tablet    Refill:  4  . ondansetron (ZOFRAN-ODT) 4 MG disintegrating tablet    Sig: Take 1-2 tablets (4-8 mg total) by mouth every 8 (eight) hours as needed for nausea.     Dispense:  30 tablet    Refill:  11   Discussed: To prevent or relieve headaches, try the following: Cool Compress. Lie down and place a cool compress on your head.  Avoid headache triggers. If certain foods or odors seem to have triggered your migraines in the past, avoid them. A headache diary might help you identify triggers.  Include physical activity in your daily routine. Try a daily walk or other moderate aerobic exercise.  Manage stress. Find healthy ways to cope with the stressors, such as delegating tasks on your to-do list.  Practice relaxation techniques. Try deep breathing, yoga, massage and visualization.  Eat regularly. Eating regularly scheduled meals and maintaining a healthy diet might help prevent headaches. Also, drink plenty of fluids.  Follow a regular sleep schedule. Sleep deprivation might contribute to headaches Consider biofeedback. With this mind-body technique, you learn to control certain bodily functions -- such as muscle tension, heart rate and blood pressure -- to prevent headaches or reduce headache pain.    Proceed to emergency room if you experience new or worsening symptoms or symptoms do not resolve, if you have new neurologic symptoms or if headache is severe, or for any concerning symptom.   Provided education and documentation from American headache  Society toolbox including articles on: chronic migraine medication overuse headache, chronic migraines, prevention of migraines, behavioral and other nonpharmacologic treatments for headache.  Cc: Flinchum, Kelby Aline, F*  Sarina Ill, MD  [facility] [address] [place], [place] [address]  Phone [phone] Fax [phone]  A total of 60 minutes was spent on this patient's care, reviewing imaging, past records, recent hospitalization notes and results. Over half this time was spent on counseling patient on the  1. Migraine without aura and without status  migrainosus, not intractable    diagnosis and different diagnostic and therapeutic options, counseling and coordination of care, risks and benefitsof management, compliance, or risk factor reduction and education.

## 2019-09-15 NOTE — Patient Instructions (Signed)
Acetaminophen; Butalbital; Caffeine tablets or capsules What is this medicine? ACETAMINOPHEN; BUTALBITAL; CAFFEINE (a set a MEE noe fen; byoo TAL bi tal; KAF een) is a pain reliever. It is used to treat tension headaches. This medicine may be used for other purposes; ask your health care provider or pharmacist if you have questions. COMMON BRAND NAME(S): Alagesic, Americet, Anolor-300, Arcet, BAC, CAPACET, Dolgic Plus, Esgic, Esgic Plus, Ezol, Fioricet, [employer], Medigesic, [place], Pacaps, Phrenilin Forte, Repan, [place], Triad, Zebutal What should I tell my health care provider before I take this medicine? They need to know if you have any of these conditions:  drug abuse or addiction  heart or circulation problems  if you often drink alcohol  kidney disease or problems going to the bathroom  liver disease  lung disease, asthma, or breathing problems  porphyria  an unusual or allergic reaction to acetaminophen, butalbital or other barbiturates, caffeine, other medicines, foods, dyes, or preservatives  pregnant or trying to get pregnant  breast-feeding How should I use this medicine? Take this medicine by mouth with a full glass of water. Follow the directions on the prescription label. If the medicine upsets your stomach, take the medicine with food or milk. Do not take more than you are told to take. Talk to your pediatrician regarding the use of this medicine in children. Special care may be needed. Overdosage: If you think you have taken too much of this medicine contact a poison control center or emergency room at once. NOTE: This medicine is only for you. Do not share this medicine with others. What if I miss a dose? If you miss a dose, take it as soon as you can. If it is almost time for your next dose, take only that dose. Do not take double or extra doses. What may interact with this medicine?  alcohol or medicines that contain alcohol  antidepressants,  especially MAOIs like isocarboxazid, phenelzine, tranylcypromine, and selegiline  antihistamines  benzodiazepines  carbamazepine  isoniazid  medicines for pain like pentazocine, buprenorphine, butorphanol, nalbuphine, tramadol, and propoxyphene  muscle relaxants  naltrexone  phenobarbital, phenytoin, and fosphenytoin  phenothiazines like perphenazine, thioridazine, chlorpromazine, mesoridazine, fluphenazine, prochlorperazine, promazine, and trifluoperazine  voriconazole This list may not describe all possible interactions. Give your health care provider a list of all the medicines, herbs, non-prescription drugs, or dietary supplements you use. Also tell them if you smoke, drink alcohol, or use illegal drugs. Some items may interact with your medicine. What should I watch for while using this medicine? Tell your doctor or health care professional if your pain does not go away, if it gets worse, or if you have new or a different type of pain. You may develop tolerance to the medicine. Tolerance means that you will need a higher dose of the medicine for pain relief. Tolerance is normal and is expected if you take the medicine for a long time. Do not suddenly stop taking your medicine because you may develop a severe reaction. Your body becomes used to the medicine. This does NOT mean you are addicted. Addiction is a behavior related to getting and using a drug for a non-medical reason. If you have pain, you have a medical reason to take pain medicine. Your doctor will tell you how much medicine to take. If your doctor wants you to stop the medicine, the dose will be slowly lowered over time to avoid any side effects. You may get drowsy or dizzy when you first start taking the medicine or  change doses. Do not drive, use machinery, or do anything that may be dangerous until you know how the medicine affects you. Stand or sit up slowly. Do not take other medicines that contain acetaminophen with  this medicine. Always read labels carefully. If you have questions, ask your doctor or pharmacist. If you take too much acetaminophen get medical help right away. Too much acetaminophen can be very dangerous and cause liver damage. Even if you do not have symptoms, it is important to get help right away. What side effects may I notice from receiving this medicine? Side effects that you should report to your doctor or health care professional as soon as possible:  allergic reactions like skin rash, itching or hives, swelling of the face, lips, or tongue  breathing problems  confusion  feeling faint or lightheaded, falls  redness, blistering, peeling or loosening of the skin, including inside the mouth  seizure  stomach pain  yellowing of the eyes or skin Side effects that usually do not require medical attention (report to your doctor or health care professional if they continue or are bothersome):  constipation  nausea, vomiting This list may not describe all possible side effects. Call your doctor for medical advice about side effects. You may report side effects to FDA at 1-800-FDA-1088. Where should I keep my medicine? Keep out of the reach of children. This medicine can be abused. Keep your medicine in a safe place to protect it from theft. Do not share this medicine with anyone. Selling or giving away this medicine is dangerous and against the law. This medicine may cause accidental overdose and death if it taken by other adults, children, or pets. Mix any unused medicine with a substance like cat litter or coffee grounds. Then throw the medicine away in a sealed container like a sealed bag or a coffee can with a lid. Do not use the medicine after the expiration date. Store at room temperature between 15 and 30 degrees C (59 and 86 degrees F). NOTE: This sheet is a summary. It may not cover all possible information. If you have questions about this medicine, talk to your doctor,  pharmacist, or health care provider.  2020 Elsevier/Gold Standard (2013-10-14 15:00:25) Ondansetron oral dissolving tablet What is this medicine? ONDANSETRON (on DAN se tron) is used to treat nausea and vomiting caused by chemotherapy. It is also used to prevent or treat nausea and vomiting after surgery. This medicine may be used for other purposes; ask your health care provider or pharmacist if you have questions. COMMON BRAND NAME(S): Zofran ODT What should I tell my health care provider before I take this medicine? They need to know if you have any of these conditions:  heart disease  history of irregular heartbeat  liver disease  low levels of magnesium or potassium in the blood  an unusual or allergic reaction to ondansetron, granisetron, other medicines, foods, dyes, or preservatives  pregnant or trying to get pregnant  breast-feeding How should I use this medicine? These tablets are made to dissolve in the mouth. Do not try to push the tablet through the foil backing. With dry hands, peel away the foil backing and gently remove the tablet. Place the tablet in the mouth and allow it to dissolve, then swallow. While you may take these tablets with water, it is not necessary to do so. Talk to your pediatrician regarding the use of this medicine in children. Special care may be needed. Overdosage: If you think you  have taken too much of this medicine contact a poison control center or emergency room at once. NOTE: This medicine is only for you. Do not share this medicine with others. What if I miss a dose? If you miss a dose, take it as soon as you can. If it is almost time for your next dose, take only that dose. Do not take double or extra doses. What may interact with this medicine? Do not take this medicine with any of the following medications:  apomorphine  certain medicines for fungal infections like fluconazole, itraconazole, ketoconazole, posaconazole,  voriconazole  cisapride  dronedarone  pimozide  thioridazine This medicine may also interact with the following medications:  carbamazepine  certain medicines for depression, anxiety, or psychotic disturbances  fentanyl  linezolid  MAOIs like Carbex, Eldepryl, Marplan, Nardil, and Parnate  methylene blue (injected into a vein)  other medicines that prolong the QT interval (cause an abnormal heart rhythm) like dofetilide, ziprasidone  phenytoin  rifampicin  tramadol This list may not describe all possible interactions. Give your health care provider a list of all the medicines, herbs, non-prescription drugs, or dietary supplements you use. Also tell them if you smoke, drink alcohol, or use illegal drugs. Some items may interact with your medicine. What should I watch for while using this medicine? Check with your doctor or health care professional as soon as you can if you have any sign of an allergic reaction. What side effects may I notice from receiving this medicine? Side effects that you should report to your doctor or health care professional as soon as possible:  allergic reactions like skin rash, itching or hives, swelling of the face, lips, or tongue  breathing problems  confusion  dizziness  fast or irregular heartbeat  feeling faint or lightheaded, falls  fever and chills  loss of balance or coordination  seizures  sweating  swelling of the hands and feet  tightness in the chest  tremors  unusually weak or tired Side effects that usually do not require medical attention (report to your doctor or health care professional if they continue or are bothersome):  constipation or diarrhea  headache This list may not describe all possible side effects. Call your doctor for medical advice about side effects. You may report side effects to FDA at 1-800-FDA-1088. Where should I keep my medicine? Keep out of the reach of children. Store between 2  and 30 degrees C (36 and 86 degrees F). Throw away any unused medicine after the expiration date. NOTE: This sheet is a summary. It may not cover all possible information. If you have questions about this medicine, talk to your doctor, pharmacist, or health care provider.  2020 Elsevier/Gold Standard (2018-08-10 07:14:10)

## 2019-09-16 ENCOUNTER — Ambulatory Visit: Payer: Self-pay | Admitting: *Deleted

## 2019-09-16 NOTE — Chronic Care Management (AMB) (Signed)
   Care Management   Unsuccessful Call Note 09/16/2019 Name: Leah Bright MRN: [id number] DOB: 08-16-1991  Patient  is a 29 year old female who sees Marvell Fuller, FNP for primary care. Marvell Fuller, FNP asked the CCM team to consult the patient for community resources to assist with obtaining needed medical care.  Referral was placed 09/07/19. Patient's last office visit was 08/18/19.     This social worker was unable to reach patient via telephone today for initial call. I have left HIPAA compliant voicemail asking patient to return my call. (unsuccessful outreach #[phone]).   Plan: Will follow-up within 7 business days via telephone.      Verna Czech, LCSW Clinical Social Worker  [facility] [phone]

## 2019-09-18 ENCOUNTER — Encounter: Payer: Self-pay | Admitting: Neurology

## 2019-09-18 DIAGNOSIS — G43009 Migraine without aura, not intractable, without status migrainosus: Secondary | ICD-10-CM | POA: Insufficient documentation

## 2019-09-19 ENCOUNTER — Other Ambulatory Visit: Payer: Self-pay

## 2019-09-19 ENCOUNTER — Ambulatory Visit (INDEPENDENT_AMBULATORY_CARE_PROVIDER_SITE_OTHER): Payer: Self-pay | Admitting: Vascular Surgery

## 2019-09-19 ENCOUNTER — Encounter (INDEPENDENT_AMBULATORY_CARE_PROVIDER_SITE_OTHER): Payer: Self-pay | Admitting: Vascular Surgery

## 2019-09-19 VITALS — BP 154/86 | HR 87 | Resp 16 | Wt 298.6 lb

## 2019-09-19 DIAGNOSIS — I701 Atherosclerosis of renal artery: Secondary | ICD-10-CM

## 2019-09-19 DIAGNOSIS — E119 Type 2 diabetes mellitus without complications: Secondary | ICD-10-CM

## 2019-09-19 DIAGNOSIS — J45909 Unspecified asthma, uncomplicated: Secondary | ICD-10-CM

## 2019-09-19 DIAGNOSIS — I10 Essential (primary) hypertension: Secondary | ICD-10-CM

## 2019-09-19 NOTE — Progress Notes (Signed)
MRN : [id number]  Leah Bright is a 29 y.o. (28-Mar-1991) female who presents with chief complaint of  Chief Complaint  Patient presents with  . New Patient (Initial Visit)    ref [place]um for renal artery stenosis  .  History of Present Illness:   The patient is seen for evaluation of hypertension associated with right renal atrophy and suspected left renal artery stenosis.  The patient has a long history of hypertension which recently has become increasingly difficult to control utilizing medical therapy. The patient is consistently documented systolic blood pressures near 720 with diastolic pressures over 90.   The patient does have family history of hypertension.   There is no prior documented abdominal bruit. The patient occasionally has flushing symptoms but denies palpitations. No episodes of syncope.There is no history of headache. There is no history of flash pulmonary edema.  The patient denies a history of renal disease.  The patient denies amaurosis fugax or recent TIA symptoms. There are no recent neurological changes noted. The patient denies claudication symptoms or rest pain symptoms. The patient denies history of DVT, PE or superficial thrombophlebitis. The patient denies recent episodes of angina or shortness of breath.   Duplex ultrasound of the renal arteries demonstrates right renal atrophy and left renal artery stenosis.   Current Meds  Medication Sig  . albuterol (PROVENTIL HFA;VENTOLIN HFA) 108 (90 Base) MCG/ACT inhaler Inhale 2 puffs into the lungs every 6 (six) hours as needed for wheezing or shortness of breath.  [name] amLODipine (NORVASC) 10 MG tablet Take 1 tablet (10 mg total) by mouth daily.  . busPIRone (BUSPAR) 5 MG tablet Take 5 mg by mouth 2 (two) times daily.  . butalbital-acetaminophen-caffeine (FIORICET) 50-325-40 MG tablet Take 1 tablet by mouth every 6 (six) hours as needed for headache.  . levothyroxine (SYNTHROID) 50 MCG tablet Take 1 tablet  (50 mcg total) by mouth daily.  . metFORMIN (GLUCOPHAGE) 500 MG tablet Take 1 tablet (500 mg total) by mouth 2 (two) times daily with a meal.  . norethindrone (MICRONOR) 0.35 MG tablet Take 1 tablet (0.35 mg total) by mouth daily.  . ondansetron (ZOFRAN-ODT) 4 MG disintegrating tablet Take 1-2 tablets (4-8 mg total) by mouth every 8 (eight) hours as needed for nausea.  . propranolol (INDERAL) 10 MG tablet Take 10 mg by mouth 3 (three) times daily as needed.   . traZODone (DESYREL) 100 MG tablet Take 100 mg by mouth at bedtime as needed for sleep.  [name] venlafaxine XR (EFFEXOR-XR) 75 MG 24 hr capsule Take 75 mg by mouth at bedtime.    Past Medical History:  Diagnosis Date  . Agitation   . Allergy   . Anxiety   . Asthma   . Chronic kidney disease   . Depression   . Diabetes mellitus without complication (HCC)   . High cholesterol   . Hypertension   . Hypothyroidism   . Left renal artery stenosis (HCC) 09/07/2019  . Migraine   . OCD (obsessive compulsive disorder)   . PTSD (post-traumatic stress disorder)   . Renal artery stenosis (HCC)   . Right renal atrophy   . Social anxiety disorder   . Social anxiety disorder   . Social phobia   . Thyroid disease     Past Surgical History:  Procedure Laterality Date  . CHOLECYSTECTOMY  2008  . endoscopy/colonoscopy   2011  . LAPAROSCOPIC GASTRIC BANDING  2007  . LAPAROSCOPIC REPAIR AND REMOVAL OF GASTRIC BAND  2011  . TONSILLECTOMY  1994  . WISDOM TOOTH EXTRACTION      Social History Social History   Tobacco Use  . Smoking status: Never Smoker  . Smokeless tobacco: Never Used  Substance Use Topics  . Alcohol use: Yes    Comment: rarely, maybe 1 shot every 2-3 months  . Drug use: Never    Family History Family History  Problem Relation Age of Onset  . Cancer Mother        brain tumor  . Hypertension Mother   . Depression Mother   . Diabetes Mother   . Migraines Mother        benign tumor, still gets headaches sometimes  now   . Anxiety disorder Father   . Depression Father   . Hypertension Maternal Grandmother   . Thyroid disease Maternal Grandmother   . Diabetes Maternal Grandmother   . Hypertension Maternal Grandfather   . Cancer Maternal Grandfather        myositis  . High blood pressure Maternal Grandfather   . Diabetes Maternal Grandfather   . Stroke Maternal Grandfather   No family history of bleeding/clotting disorders, porphyria or autoimmune disease   Allergies  Allergen Reactions  . Toradol [Ketorolac Tromethamine] Hives  . Codeine Rash  . Other Rash    Cilantro and Cedar trees  . Pepto-Bismol [Bismuth] Rash  . Tramadol Rash     REVIEW OF SYSTEMS (Negative unless checked)  Constitutional: [] Weight loss  [] Fever  [] Chills Cardiac: [] Chest pain   [] Chest pressure   [] Palpitations   [] Shortness of breath when laying flat   [] Shortness of breath with exertion. Vascular:  [] Pain in legs with walking   [] Pain in legs at rest  [] History of DVT   [] Phlebitis   [] Swelling in legs   [] Varicose veins   [] Non-healing ulcers Pulmonary:   [] Uses home oxygen   [] Productive cough   [] Hemoptysis   [] Wheeze  [] COPD   [] Asthma Neurologic:  [] Dizziness   [] Seizures   [] History of stroke   [] History of TIA  [] Aphasia   [] Vissual changes   [] Weakness or numbness in arm   [] Weakness or numbness in leg Musculoskeletal:   [] Joint swelling   [] Joint pain   [] Low back pain Hematologic:  [] Easy bruising  [] Easy bleeding   [] Hypercoagulable state   [] Anemic Gastrointestinal:  [] Diarrhea   [] Vomiting  [] Gastroesophageal reflux/heartburn   [] Difficulty swallowing. Genitourinary:  [] Chronic kidney disease   [] Difficult urination  [] Frequent urination   [] Blood in urine Skin:  [] Rashes   [] Ulcers  Psychological:  [] History of anxiety   []  History of major depression.  Physical Examination  Vitals:   09/19/19 1313  BP: (!) 154/86  Pulse: 87  Resp: 16  Weight: 298 lb 9.6 oz (135.4 kg)   Body mass index is  48.93 kg/m. Gen: WD/WN, NAD Head: Esmeralda/AT, No temporalis wasting.  Ear/Nose/Throat: Hearing grossly intact, nares w/o erythema or drainage, poor dentition Eyes: PER, EOMI, sclera nonicteric.  Neck: Supple, no masses.  No bruit or JVD.  Pulmonary:  Good air movement, clear to auscultation bilaterally, no use of accessory muscles.  Cardiac: RRR, normal S1, S2, no Murmurs. Vascular:  Vessel Right Left  Radial Palpable Palpable  Gastrointestinal: soft, non-distended. No guarding/no peritoneal signs.  Musculoskeletal: M/S 5/5 throughout.  No deformity or atrophy.  Neurologic: CN 2-12 intact. Pain and light touch intact in extremities.  Symmetrical.  Speech is fluent. Motor exam as listed above. Psychiatric: Judgment intact, Mood & affect appropriate  for pt's clinical situation. Dermatologic: No rashes or ulcers noted.  No changes consistent with cellulitis. Lymph : No Cervical lymphadenopathy, no lichenification or skin changes of chronic lymphedema.  CBC Lab Results  Component Value Date   WBC 7.3 07/19/2019   HGB 14.5 07/19/2019   HCT 43.7 07/19/2019   MCV 82 07/19/2019   PLT 268 07/19/2019    BMET    Component Value Date/Time   NA 140 07/19/2019 1224   K 4.2 07/19/2019 1224   CL 102 07/19/2019 1224   CO2 25 10/26/2018 1114   GLUCOSE 236 (H) 07/19/2019 1224   GLUCOSE 135 (H) 10/26/2018 1114   BUN 10 07/19/2019 1224   CREATININE 0.81 07/19/2019 1224   CALCIUM 10.1 07/19/2019 1224   GFRNONAA 98 07/19/2019 1224   GFRAA 113 07/19/2019 1224   CrCl cannot be calculated (Patient's most recent lab result is older than the maximum 21 days allowed.).  COAG No results found for: INR, PROTIME  Radiology US Renal Artery Stenosis  Result Date: 09/07/2019 CLINICAL DATA:  Right renal atrophy, concern for renal artery stenosis EXAM: RENAL/URINARY TRACT ULTRASOUND RENAL DUPLEX DOPPLER ULTRASOUND COMPARISON:  CT without contrast 05/13/2018 FINDINGS: Right Kidney: Length: 6.7 cm. Chronic  right renal atrophy without obstruction or hydronephrosis. Left Kidney: Length: 11.5 cm. Compensatory hypertrophy with cortical thickening. No acute finding or hydronephrosis. Bladder:  Normal appearance by ultrasound RENAL DUPLEX ULTRASOUND Right Renal Artery Velocities: Origin:  193 cm/sec Mid:  151 cm/sec Hilum:  69 cm/sec Interlobar:  33 cm/sec Arcuate:  19 cm/sec Left Renal Artery Velocities: Origin:  154 cm/sec Mid:  254 cm/sec Hilum:  298 cm/sec Interlobar:  58 cm/sec Arcuate:  55 cm/sec Aortic Velocity:  146 cm/sec Right Renal-Aortic Ratios: Origin: 1.3 Mid:  81.0 Hilum: 0.5 Interlobar: 0.2 Arcuate: 0.1 Left Renal-Aortic Ratios: Origin: 1.0 Mid:  1.7 Hilum: 2.0 Interlobar: 0.4 Arcuate: 0.4 Limited exam because of chronic right renal atrophy and obese body habitus. Left mid and hilar renal artery peak systolic velocities are greater than 200 centimeters/second indicative of some degree of left renal artery stenosis. Renal veins appear patent. IMPRESSION: Limited exam as above. Known chronic right renal atrophy. Elevated left renal artery peak systolic velocities indicative of some degree of stenosis. Consider further evaluation with CTA abdomen given her normal creatinine. Electronically Signed   By: Judie Petit.  Shick M.D.   On: 09/07/2019 12:22     Assessment/Plan 1. Renal artery stenosis (HCC) BP today was not acceptable with systolic reading > 130 and diastolic reading >36 while taking antihypertensive medication.  Given that optimal control of the patient's hypertension is important to minimize the risk of heart attack and/or CVA.  The patient's BP and noninvasive studies suggest the possibility of a hemodynamically significant stricture or stenosis.  CT angiography was discussed with the patient, the risks and benefits were reviewed and all questions were answered.  The patient has agreed to proceed with angiography.     The patient will continue the current medications, no changes at this  time.  The primary medical service will continue aggressive antihypertensive therapy as per the AHA guidelines   - CT Angio Abd/Pel w/ and/or w/o; Future  2. Essential hypertension Continue antihypertensive medications as already ordered, these medications have been reviewed and there are no changes at this time.   3. Type 2 diabetes mellitus without complication, without long-term current use of insulin (HCC) Continue hypoglycemic medications as already ordered, these medications have been reviewed and there are no changes at this  time.  Hgb A1C to be monitored as already arranged by primary service   4. Chronic asthma, unspecified asthma severity, unspecified whether complicated, unspecified whether persistent Continue pulmonary medications and aerosols as already ordered, these medications have been reviewed and there are no changes at this time.    Levora Dredge, MD  09/19/2019 1:19 PM

## 2019-09-20 ENCOUNTER — Encounter: Payer: Self-pay | Admitting: Adult Health

## 2019-09-20 ENCOUNTER — Ambulatory Visit (INDEPENDENT_AMBULATORY_CARE_PROVIDER_SITE_OTHER): Payer: Self-pay | Admitting: Adult Health

## 2019-09-20 VITALS — BP 142/102 | HR 103 | Temp 97.7°F | Resp 16 | Wt 301.0 lb

## 2019-09-20 DIAGNOSIS — Z8639 Personal history of other endocrine, nutritional and metabolic disease: Secondary | ICD-10-CM | POA: Insufficient documentation

## 2019-09-20 DIAGNOSIS — N261 Atrophy of kidney (terminal): Secondary | ICD-10-CM | POA: Insufficient documentation

## 2019-09-20 DIAGNOSIS — E1159 Type 2 diabetes mellitus with other circulatory complications: Secondary | ICD-10-CM

## 2019-09-20 DIAGNOSIS — I701 Atherosclerosis of renal artery: Secondary | ICD-10-CM

## 2019-09-20 DIAGNOSIS — F39 Unspecified mood [affective] disorder: Secondary | ICD-10-CM

## 2019-09-20 DIAGNOSIS — I152 Hypertension secondary to endocrine disorders: Secondary | ICD-10-CM

## 2019-09-20 DIAGNOSIS — Z6841 Body Mass Index (BMI) 40.0 and over, adult: Secondary | ICD-10-CM

## 2019-09-20 DIAGNOSIS — R748 Abnormal levels of other serum enzymes: Secondary | ICD-10-CM

## 2019-09-20 DIAGNOSIS — I10 Essential (primary) hypertension: Secondary | ICD-10-CM

## 2019-09-20 MED ORDER — METOPROLOL SUCCINATE ER 25 MG PO TB24: 25.0000 mg | ORAL_TABLET | Freq: Every day | ORAL | 0 refills | Status: DC

## 2019-09-20 NOTE — Patient Instructions (Addendum)
Come to the lab after February 17 th for recheck of labs. Lab hours Monday to Friday except for Holidays. 8-3pm closed or lunch. Water before labs is ok !   Check your heart rate before taking Metoprolol XL if less than 60 beats per minute do not take. Call office.    Renal Artery Stenosis Renal artery stenosis (RAS) is a narrowing of the artery that carries blood to the kidneys. It can affect one or both kidneys. The kidneys filter waste and extra fluid from the blood. Waste and fluid are then removed when a person passes urine. The kidneys also make an important chemical messenger (hormone) called renin. Renin helps regulate blood pressure. The first sign of RAS may be high blood pressure. Other symptoms can develop over time. What are the causes? A common cause of this condition is plaque buildup in your arteries (atherosclerosis). The plaques that cause this are made up of:  Fat.  Cholesterol.  Calcium.  Other substances. As these substances build up in your renal artery, the blood supply to your kidneys slows. The lack of blood and oxygen causes the signs and symptoms of RAS. A much less common cause of RAS is a disease called fibromuscular dysplasia. This disease causes abnormal cell growth that narrows the renal artery. It is not related to atherosclerosis. It occurs mostly in women who are 77-57 years old. It may be passed down through families.  What increases the risk? You are more likely to develop this condition if you:  Are a man who is at least 31 years old.  Are a woman who is at least 31 years old.  Have high blood pressure.  Have high cholesterol.  Are a smoker.  Abuse alcohol.  Have diabetes or prediabetes.  Are overweight or obese.  Have a family history of early heart disease. What are the signs or symptoms? RAS usually develops slowly. You may not have any signs or symptoms at first. Early signs may include:  Development of high blood pressure.  A  sudden increase in existing high blood pressure.  No longer responding to medicine that used to control your blood pressure. Later signs and symptoms are due to kidney damage. They may include:  Feeling tired (fatigue).  Shortness of breath.  Swollen legs and feet.  Dry skin.  Headaches.  Muscle cramps.  Loss of appetite.  Nausea or vomiting. How is this diagnosed? This condition may be diagnosed based on:  Your symptoms and medical history. Your health care provider may suspect RAS based on changes in your blood pressure and your risk factors.  A physical exam. During the exam, your health care provider will use a stethoscope to listen for a whooshing sound (bruit) that can occur where the renal artery is blocking blood flow.  Various tests. These may include: ? Blood and urine tests to check your kidney function. ? Imaging tests of your kidneys, such as:  A test that uses sound waves to create an image of your kidneys and the blood flow to your kidneys (ultrasound).  A test in which dye is injected into one of your blood vessels so images can be taken as the dye flows through your renal arteries (angiogram). This can be done using X-rays, a CT scan (computed tomography angiogram, CTA), or a type of MRI (magnetic resonance angiogram, MRA). How is this treated? Making lifestyle changes to reduce your risk factors is the first treatment option for early RAS. If the blood flow to  one of your kidneys is cut by more than half, you may need medicine to:  Lower your blood pressure. This is the main medical treatment for RAS. You may need more than one type of medicine for this. The types that work best for people with RAS are: ? ACE inhibitors. ? Angiotensin receptor blockers.  Reduce fluid in the body (diuretics).  Lower your cholesterol (statins). If medicine is not enough to control RAS, you may need surgery. This may involve:  Threading a tube with an inflatable balloon  into the renal artery to force it to open (angioplasty).  Removing plaque from inside the artery (endarterectomy). Follow these instructions at home:  Lifestyle  Make any lifestyle changes recommended by your health care provider. This may include: ? Working with a dietitian to maintain a heart-healthy diet. This type of diet is low in saturated fat, salt, and added sugar. ? Starting an exercise program as directed by your health care provider. ? Maintaining a healthy weight. ? Quitting smoking. ? Not abusing alcohol. General instructions  Take over-the-counter and prescription medicines only as told by your health care provider.  Keep all follow-up visits as told by your health care provider. This is important. Contact a health care provider if:  Your symptoms of RAS are not getting better.  Your symptoms are changing or getting worse. Get help right away if you have:  Very bad pain in your back or abdomen.  Blood in your urine. Summary  Renal artery stenosis (RAS) is a narrowing of the artery that carries blood to the kidneys. It can affect one or both kidneys.  RAS usually develops slowly. You may not have any signs or symptoms at first, but high blood pressure that is difficult to control is a key symptom.  Making lifestyle changes to reduce your risk factors is the first treatment option for early RAS. If the blood flow to one of your kidneys is cut by more than half, you may need medicines to help manage your cholesterol and blood pressure. This information is not intended to replace advice given to you by your health care provider. Make sure you discuss any questions you have with your health care provider. Document Revised: 09/14/2017 Document Reviewed: 09/14/2017 Elsevier Patient Education  2020 Else[employer]  Metoprolol Extended-Release Tablets What is this medicine? METOPROLOL (me TOE proe lole) is a beta blocker. It decreases the amount of work your heart has to  do and helps your heart beat regularly. It treats high blood pressure and/or prevent chest pain (also called angina). It also treats heart failure. This medicine may be used for other purposes; ask your health care provider or pharmacist if you have questions. COMMON BRAND NAME(S): toprol, Toprol XL What should I tell my health care provider before I take this medicine? They need to know if you have any of these conditions:  diabetes  heart or vessel disease like slow heart rate, worsening heart failure, heart block, sick sinus syndrome or Raynaud's disease  kidney disease  liver disease  lung or breathing disease, like asthma or emphysema  pheochromocytoma  thyroid disease  an unusual or allergic reaction to metoprolol, other beta-blockers, medicines, foods, dyes, or preservatives  pregnant or trying to get pregnant  breast-feeding How should I use this medicine? Take this drug by mouth. Take it as directed on the prescription label at the same time every day. Take it with food. You may cut the tablet in half if it is scored (has  a line in the middle of it). This may help you swallow the tablet if the whole tablet is too big. Be sure to take both halves. Do not take just one-half of the tablet. Keep taking it unless your health care provider tells you to stop. Talk to your health care provider about the use of this drug in children. While it may be prescribed for children as young as 6 for selected conditions, precautions do apply. Overdosage: If you think you have taken too much of this medicine contact a poison control center or emergency room at once. NOTE: This medicine is only for you. Do not share this medicine with others. What if I miss a dose? If you miss a dose, take it as soon as you can. If it is almost time for your next dose, take only that dose. Do not take double or extra doses. What may interact with this medicine? This medicine may interact with the following  medications:  certain medicines for blood pressure, heart disease, irregular heart beat  certain medicines for depression, like monoamine oxidase (MAO) inhibitors, fluoxetine, or paroxetine  clonidine  dobutamine  epinephrine  isoproterenol  reserpine This list may not describe all possible interactions. Give your health care provider a list of all the medicines, herbs, non-prescription drugs, or dietary supplements you use. Also tell them if you smoke, drink alcohol, or use illegal drugs. Some items may interact with your medicine. What should I watch for while using this medicine? Visit your doctor or health care professional for regular check ups. Contact your doctor right away if your symptoms worsen. Check your blood pressure and pulse rate regularly. Ask your health care professional what your blood pressure and pulse rate should be, and when you should contact them. You may get drowsy or dizzy. Do not drive, use machinery, or do anything that needs mental alertness until you know how this medicine affects you. Do not sit or stand up quickly, especially if you are an older patient. This reduces the risk of dizzy or fainting spells. Contact your doctor if these symptoms continue. Alcohol may interfere with the effect of this medicine. Avoid alcoholic drinks. This medicine may increase blood sugar. Ask your healthcare provider if changes in diet or medicines are needed if you have diabetes. What side effects may I notice from receiving this medicine? Side effects that you should report to your doctor or health care professional as soon as possible:  allergic reactions like skin rash, itching or hives  cold or numb hands or feet  depression  difficulty breathing  faint  fever with sore throat  irregular heartbeat, chest pain  rapid weight gain   signs and symptoms of high blood sugar such as being more thirsty or hungry or having to urinate more than normal. You may also  feel very tired or have blurry vision.  swollen legs or ankles Side effects that usually do not require medical attention (report to your doctor or health care professional if they continue or are bothersome):  anxiety or nervousness  change in sex drive or performance  dry skin  headache  nightmares or trouble sleeping  short term memory loss  stomach upset or diarrhea This list may not describe all possible side effects. Call your doctor for medical advice about side effects. You may report side effects to FDA at 1-800-FDA-1088. Where should I keep my medicine? Keep out of the reach of children and pets. Store at room temperature between 20 and  25 degrees C (68 and 77 degrees F). Throw away any unused drug after the expiration date. NOTE: This sheet is a summary. It may not cover all possible information. If you have questions about this medicine, talk to your doctor, pharmacist, or health care provider.  2020 Elsevier/Gold Standard (2019-03-31 18:23:00)

## 2019-09-20 NOTE — Progress Notes (Signed)
Patient: Leah Bright Female    DOB: 1991/07/18   29 y.o.   MRN: [id number] Visit Date: 09/20/2019  Today's Provider: Marcille Buffy, FNP   No chief complaint on file.  Subjective:     Hypertension, follow-up:  BP Readings from Last 3 Encounters:  09/19/19 (!) 154/86  09/15/19 (!) 148/86  08/18/19 (!) 135/98    She was last seen for hypertension 1 months ago.  BP at that visit was 135/98. Management changes since that visit include started Amlodpine. She reports excellent compliance with treatment. She is having side effects.  She is exercising 5x a week. She is adherent to low salt diet.   Outside blood pressures are not being checked regularly, patient reports that last b/p reading she had was 158/84. She is experiencing none.  Patient denies chest pain, chest pressure/discomfort, claudication, dyspnea, exertional chest pressure/discomfort, fatigue, irregular heart beat, lower extremity edema, near-syncope, orthopnea, palpitations, paroxysmal nocturnal dyspnea, syncope and tachypnea.   Cardiovascular risk factors include obesity (BMI >= 30 kg/m2).  Use of agents associated with hypertension: none.    She takes her propanolol before going in a grocery store only.   She is not checking her blood sugar. She reports she can not afford supplies.   Weight trend: stable Wt Readings from Last 3 Encounters:  09/19/19 298 lb 9.6 oz (135.4 kg)  09/15/19 (!) 305 lb (138.3 kg)  08/18/19 (!) 305 lb 9.6 oz (138.6 kg)    Current diet: well balanced   She denies any chance of pregnancy she is on her menses currently. She is taking OCP as prescribed.   ------------------------------------------------------------------------  Anxiety Presents for follow-up visit. Symptoms include depressed mood, insomnia, irritability and nervous/anxious behavior. Patient reports no chest pain, compulsions, confusion, decreased concentration, dizziness, dry mouth, excessive worry,  feeling of choking, hyperventilation, impotence, malaise, muscle tension, nausea, obsessions, palpitations, panic, restlessness, shortness of breath or suicidal ideas. The quality of sleep is poor. Nighttime awakenings: several.   Compliance with medications is 76-100%.    Allergies  Allergen Reactions  . Toradol [Ketorolac Tromethamine] Hives  . Codeine Rash  . Other Rash    Cilantro and Cedar trees  . Pepto-Bismol [Bismuth] Rash  . Tramadol Rash     Current Outpatient Medications:  .  albuterol (PROVENTIL HFA;VENTOLIN HFA) 108 (90 Base) MCG/ACT inhaler, Inhale 2 puffs into the lungs every 6 (six) hours as needed for wheezing or shortness of breath., Disp: 1 Inhaler, Rfl: 0 .  amLODipine (NORVASC) 10 MG tablet, Take 1 tablet (10 mg total) by mouth daily., Disp: 30 tablet, Rfl: 0 .  busPIRone (BUSPAR) 5 MG tablet, Take 5 mg by mouth 2 (two) times daily., Disp: , Rfl:  .  butalbital-acetaminophen-caffeine (FIORICET) 50-325-40 MG tablet, Take 1 tablet by mouth every 6 (six) hours as needed for headache., Disp: 10 tablet, Rfl: 4 .  levothyroxine (SYNTHROID) 50 MCG tablet, Take 1 tablet (50 mcg total) by mouth daily., Disp: 30 tablet, Rfl: 0 .  metFORMIN (GLUCOPHAGE) 500 MG tablet, Take 1 tablet (500 mg total) by mouth 2 (two) times daily with a meal., Disp: 60 tablet, Rfl: 0 .  norethindrone (MICRONOR) 0.35 MG tablet, Take 1 tablet (0.35 mg total) by mouth daily., Disp: 1 Package, Rfl: 11 .  ondansetron (ZOFRAN-ODT) 4 MG disintegrating tablet, Take 1-2 tablets (4-8 mg total) by mouth every 8 (eight) hours as needed for nausea., Disp: 30 tablet, Rfl: 11 .  propranolol (INDERAL) 10 MG tablet,  Take 10 mg by mouth 3 (three) times daily as needed. , Disp: , Rfl:  .  traZODone (DESYREL) 100 MG tablet, Take 100 mg by mouth at bedtime as needed for sleep., Disp: , Rfl:  .  venlafaxine XR (EFFEXOR-XR) 75 MG 24 hr capsule, Take 75 mg by mouth at bedtime., Disp: , Rfl:    No LMP recorded. (Menstrual  status: Irregular Periods). 09/19/2019  Reviewed office visit as below on 09/20/2019 [facility] Dr. Gilda Crease.  Assessment/Plan 1. Renal artery stenosis (HCC) BP today was not acceptable with systolic reading > 130 and diastolic reading >71 while taking antihypertensive medication.  Given that optimal control of the patient's hypertension is important to minimize the risk of heart attack and/or CVA.  The patient's BP and noninvasive studies suggest the possibility of a hemodynamically significant stricture or stenosis.  CT angiography was discussed with the patient, the risks and benefits were reviewed and all questions were answered.  The patient has agreed to proceed with angiography.     The patient will continue the current medications, no changes at this time.  The primary medical service will continue aggressive antihypertensive therapy as per the AHA guidelines   Review of Systems  Constitutional: Positive for fatigue and irritability. Negative for activity change, appetite change, chills, diaphoresis, fever and unexpected weight change.  HENT: Negative.   Eyes: Negative.   Respiratory: Negative for shortness of breath.   Cardiovascular: Negative for chest pain and palpitations.  Gastrointestinal: Positive for diarrhea (improved since last visit. "much less" denies any blood. Denies rectal pain. Has with situational anxiety). Negative for abdominal distention, abdominal pain, anal bleeding, blood in stool, constipation, nausea, rectal pain and vomiting.  Endocrine: Negative for cold intolerance, heat intolerance, polydipsia, polyphagia and polyuria.  Genitourinary: Negative.  Negative for impotence.  Musculoskeletal: Negative.   Skin: Negative.   Neurological: Negative.  Negative for dizziness.  Hematological: Negative.   Psychiatric/Behavioral: Negative for agitation, behavioral problems, confusion, decreased concentration, dysphoric mood, hallucinations, self-injury,  sleep disturbance and suicidal ideas. The patient is nervous/anxious and has insomnia. The patient is not hyperactive.     Social History   Tobacco Use  . Smoking status: Never Smoker  . Smokeless tobacco: Never Used  Substance Use Topics  . Alcohol use: Yes    Comment: rarely, maybe 1 shot every 2-3 months      Objective:  Blood pressure (!) 142/102, pulse (!) 103, temperature 97.7 F (36.5 C), temperature source Oral, resp. rate 16, weight (!) 301 lb (136.5 kg), SpO2 98 %.    Physical Exam Nursing note reviewed.  Constitutional:      General: She is not in acute distress.    Appearance: Normal appearance. She is obese. She is not ill-appearing, toxic-appearing or diaphoretic.  HENT:     Head: Normocephalic.  Neck:     Vascular: No carotid bruit.  Cardiovascular:     Rate and Rhythm: Regular rhythm. Tachycardia present.  No extrasystoles are present.    Pulses: Normal pulses.          Carotid pulses are 2+ on the right side and 2+ on the left side.      Radial pulses are 2+ on the right side and 2+ on the left side.       Femoral pulses are 2+ on the right side.      Dorsalis pedis pulses are 2+ on the right side and 2+ on the left side.       Posterior  tibial pulses are 2+ on the right side and 2+ on the left side.     Heart sounds: Normal heart sounds. No murmur. No friction rub. No gallop.   Pulmonary:     Effort: Pulmonary effort is normal. No respiratory distress.     Breath sounds: Normal breath sounds. No stridor. No wheezing, rhonchi or rales.  Chest:     Chest wall: No tenderness.  Abdominal:     General: Abdomen is flat. Bowel sounds are normal. There is no distension or abdominal bruit. There are no signs of injury.     Palpations: Abdomen is soft. There is no mass.     Tenderness: There is no abdominal tenderness. There is no right CVA tenderness, left CVA tenderness, guarding or rebound.     Hernia: No hernia is present.  Musculoskeletal:        General:  Normal range of motion.     Cervical back: Neck supple. No rigidity or tenderness.     Right lower leg: No edema.     Left lower leg: No edema.  Lymphadenopathy:     Cervical: No cervical adenopathy.  Skin:    General: Skin is warm and dry.     Capillary Refill: Capillary refill takes less than 2 seconds.     Coloration: Skin is not jaundiced or pale.     Findings: No bruising, erythema, lesion or rash.  Neurological:     General: No focal deficit present.     Mental Status: She is alert and oriented to person, place, and time.     Cranial Nerves: No cranial nerve deficit.     Sensory: No sensory deficit.     Motor: No weakness.     Coordination: Coordination normal.     Gait: Gait normal.     Deep Tendon Reflexes: Reflexes normal.  Psychiatric:        Attention and Perception: Attention and perception normal.        Mood and Affect: Affect normal. Mood is anxious (however much less anxious than last visit ).        Speech: Speech normal.        Behavior: Behavior normal. Behavior is cooperative.        Thought Content: Thought content normal.        Cognition and Memory: Cognition and memory normal.        Judgment: Judgment normal.      No results found for any visits on 09/20/19.     Assessment & Plan     1. History of diabetes mellitus, type II She had been off her medications at first visit.  - HgB A1c; Future Lifestyle and dietary changes advised/ reviewed.   2. Hypertension associated with diabetes (HCC) Will add medication today.   Meds ordered this encounter  Medications  . metoprolol succinate (TOPROL XL) 25 MG 24 hr tablet    Sig: Take 1 tablet (25 mg total) by mouth daily. Check heart rate prior and do not take if heart rate is less than 60 beats per minute.    Dispense:  60 tablet    Refill:  0   Medications Discontinued During This Encounter  Medication Reason  . propranolol (INDERAL) 10 MG tablet Completed Course   - EKG 12-Lead- normal   3.  Renal atrophy, right Congenital   4. Stenosis of left renal artery (HCC) New, she will keep appointment for CT angiogram and follow up with Dr. Gilda Crease.   5.  Elevated Liver Enzymes Avoid alcohol and tylenol. Recheck within 1 month.   Orders Placed This Encounter  Procedures  . HgB A1c  . EKG 12-Lead   She will return within 1 month for repeat liver enzymes, lipid panel. CMP, CBC. and Hemoglobin A1C. Orders are in place.  Diet and exercise for hypertension and diabetes.    Hypertension associated with diabetes (HCC) - Plan: EKG 12-Lead  History of diabetes mellitus, type II - Plan: HgB A1c  Renal atrophy, right  Stenosis of left renal artery (HCC)  Elevated liver enzymes  Class 3 severe obesity due to excess calories with serious comorbidity and body mass index (BMI) of 45.0 to 49.9 in adult [facility])  Return in about 2 weeks (around 10/04/2019), or if symptoms worsen or fail to improve, for at any time for any worsening symptoms, Go to Emergency room/ urgent care if worse.  Advised patient call the office or your primary care doctor for an appointment if no improvement within 72 hours or if any symptoms change or worsen at any time  Advised ER or urgent Care if after hours or on weekend. Call 911 for emergency symptoms at any time.Patinet verbalized understanding of all instructions given/reviewed and treatment plan and has no further questions or concerns at this time.       The entirety of the information documented in the History of Present Illness, Review of Systems and Physical Exam were personally obtained by me. Portions of this information were initially documented by the  Certified Medical Assistant whose name is documented in Epic and reviewed by me for thoroughness and accuracy.  I have personally performed the exam and reviewed the chart and it is accurate to the best of my knowledge.  Museum/gallery conservator has been used and any errors in dictation or transcription are  unintentional.  Eula Fried. Flinchum FNP-C  [facility]   Jairo Ben, FNP  [facility]

## 2019-09-26 ENCOUNTER — Ambulatory Visit: Payer: Self-pay | Admitting: *Deleted

## 2019-09-26 DIAGNOSIS — N261 Atrophy of kidney (terminal): Secondary | ICD-10-CM

## 2019-09-26 DIAGNOSIS — F331 Major depressive disorder, recurrent, moderate: Secondary | ICD-10-CM

## 2019-09-26 NOTE — Patient Instructions (Signed)
Thank you allowing the Chronic Care Management Team to be a part of your care! It was a pleasure speaking with you today!  Please complete the medicaid application once received Please call this social worker with any questions or concerns regarding the medicaid application  CCM (Chronic Care Management) Team   [name] RN, BSN Nurse Care Coordinator  [phone]  Karalee Height PharmD  Clinical Pharmacist  [phone]   Madoline Bhatt [address] LCSW Clinical Social Worker [phone]  Goals Addressed            This Visit's Progress   . "I need a CT scan of my kidneys" (pt-stated)       Current Barriers:  . Financial constraints related to lack of insurance  to cover needed medical follow up  Clinical Social Work Clinical Goal(s):  [name] Over the next 90 days, patient will work on completing a medicaid application to cover needed medical follow up  Interventions: . Patient interviewed and appropriate assessments performed . Provided patient with information about applying for [facility] through [facility] to assist with needed medical follow up . Conference call to the [employer] office to check eligibility for [facility] . Confirmed that patient is not eligible for [facility] at this time due to the fact that she has not applied for medicaid in the last 3 months. Per patient, she applied over 2 years ago. . Discussed plan to mail patient a Medicaid application to complete . Discussed plans with patient for ongoing care management follow up and provided patient with direct contact information for care management team . Advised patient to complete Medicaid application once received . Confirmed that patient follow up with [facility] for ongoing mental health treatment  Patient Self Care Activities:  . Patient verbalizes understanding of plan to completed Medicaid application once received . Unable to perform IADLs independently . Knowledge deficit related to  community resources to meet her medical needs  Initial goal documentation         The patient verbalized understanding of instructions provided today and declined a print copy of patient instruction materials.   Telephone follow up appointment with care management team member scheduled for:  10/07/19

## 2019-09-26 NOTE — Chronic Care Management (AMB) (Signed)
Care Management    Clinical Social Work General Note  09/26/2019 Name: Leah Bright MRN: [id number] DOB: 1991-07-18  Leah Bright is a 29 y.o. year old female who is a primary care patient of Flinchum, Eula Fried, FNP. The CCM was consulted to assist the patient with [employer] .   Leah Bright was given information about  Care Management services today including:  1. CM service includes personalized support from designated clinical staff supervised by her physician, including individualized plan of care and coordination with other care providers 2. 24/7 contact phone numbers for assistance for urgent and routine care needs. 3. The patient may stop CM services at any time (effective at the end of the month) by phone call to the office staff.   Patient agreed to services and verbal consent obtained.   Review of patient status, including review of consultants reports, relevant laboratory and other test results, and collaboration with appropriate care team members and the patient's provider was performed as part of comprehensive patient evaluation and provision of chronic care management services.    SDOH (Social Determinants of Health) screening performed today. See Care Plan Entry related to challenges with: Stress  Outpatient Encounter Medications as of 09/26/2019  Medication Sig  . albuterol (PROVENTIL HFA;VENTOLIN HFA) 108 (90 Base) MCG/ACT inhaler Inhale 2 puffs into the lungs every 6 (six) hours as needed for wheezing or shortness of breath.  [name] amLODipine (NORVASC) 10 MG tablet Take 1 tablet (10 mg total) by mouth daily.  . busPIRone (BUSPAR) 5 MG tablet Take 5 mg by mouth 2 (two) times daily.  . butalbital-acetaminophen-caffeine (FIORICET) 50-325-40 MG tablet Take 1 tablet by mouth every 6 (six) hours as needed for headache.  . levothyroxine (SYNTHROID) 50 MCG tablet Take 1 tablet (50 mcg total) by mouth daily.  . metFORMIN (GLUCOPHAGE) 500 MG tablet Take 1 tablet (500 mg  total) by mouth 2 (two) times daily with a meal.  . metoprolol succinate (TOPROL XL) 25 MG 24 hr tablet Take 1 tablet (25 mg total) by mouth daily. Check heart rate prior and do not take if heart rate is less than 60 beats per minute.  . norethindrone (MICRONOR) 0.35 MG tablet Take 1 tablet (0.35 mg total) by mouth daily.  . ondansetron (ZOFRAN-ODT) 4 MG disintegrating tablet Take 1-2 tablets (4-8 mg total) by mouth every 8 (eight) hours as needed for nausea.  . traZODone (DESYREL) 100 MG tablet Take 100 mg by mouth at bedtime as needed for sleep.  [name] venlafaxine XR (EFFEXOR-XR) 75 MG 24 hr capsule Take 75 mg by mouth at bedtime.   No facility-administered encounter medications on file as of 09/26/2019.    Goals Addressed            This Visit's Progress   . "I need a CT scan of my kidneys" (pt-stated)       Current Barriers:  . Financial constraints related to lack of insurance  to cover needed medical follow up  Clinical Social Work Clinical Goal(s):  [name] Over the next 90 days, patient will work on completing a medicaid application to cover needed medical follow up  Interventions: . Patient interviewed and appropriate assessments performed . Provided patient with information about applying for [facility] through [facility] to assist with needed medical follow up . Conference call to the [employer] office to check eligibility for [facility] . Confirmed that patient is not eligible for [facility] at this time due to the fact  that she has not applied for medicaid in the last 3 months. Per patient, she applied over 2 years ago. . Discussed plan to mail patient a Medicaid application to complete . Discussed plans with patient for ongoing care management follow up and provided patient with direct contact information for care management team . Advised patient to complete Medicaid application once received . Confirmed that patient follow up with [facility] for ongoing mental health  treatment  Patient Self Care Activities:  . Patient verbalizes understanding of plan to completed Medicaid application once received . Unable to perform IADLs independently . Knowledge deficit related to community resources to meet her medical needs  Initial goal documentation         Follow Up Plan: SW will follow up with patient by phone over the next 2 weeks       [place], [place] Worker  [facility] [facility] Care Management [phone]

## 2019-09-28 ENCOUNTER — Ambulatory Visit: Payer: Self-pay | Admitting: *Deleted

## 2019-09-28 NOTE — Chronic Care Management (AMB) (Signed)
  Chronic Care Management   Social Work Note  09/28/2019 Name: Leah Bright MRN: [id number] DOB: September 20, 1990  Leah Bright is a 29 y.o. year old female who sees Flinchum, Eula Fried, FNP for primary care. The CCM team was consulted for assistance with [employer] .   Medicaid application mailed to patient's home to complete and return to the [employer].   Outpatient Encounter Medications as of 09/28/2019  Medication Sig  . albuterol (PROVENTIL HFA;VENTOLIN HFA) 108 (90 Base) MCG/ACT inhaler Inhale 2 puffs into the lungs every 6 (six) hours as needed for wheezing or shortness of breath.  [name] amLODipine (NORVASC) 10 MG tablet Take 1 tablet (10 mg total) by mouth daily.  . busPIRone (BUSPAR) 5 MG tablet Take 5 mg by mouth 2 (two) times daily.  . butalbital-acetaminophen-caffeine (FIORICET) 50-325-40 MG tablet Take 1 tablet by mouth every 6 (six) hours as needed for headache.  . levothyroxine (SYNTHROID) 50 MCG tablet Take 1 tablet (50 mcg total) by mouth daily.  . metFORMIN (GLUCOPHAGE) 500 MG tablet Take 1 tablet (500 mg total) by mouth 2 (two) times daily with a meal.  . metoprolol succinate (TOPROL XL) 25 MG 24 hr tablet Take 1 tablet (25 mg total) by mouth daily. Check heart rate prior and do not take if heart rate is less than 60 beats per minute.  . norethindrone (MICRONOR) 0.35 MG tablet Take 1 tablet (0.35 mg total) by mouth daily.  . ondansetron (ZOFRAN-ODT) 4 MG disintegrating tablet Take 1-2 tablets (4-8 mg total) by mouth every 8 (eight) hours as needed for nausea.  . traZODone (DESYREL) 100 MG tablet Take 100 mg by mouth at bedtime as needed for sleep.  [name] venlafaxine XR (EFFEXOR-XR) 75 MG 24 hr capsule Take 75 mg by mouth at bedtime.   No facility-administered encounter medications on file as of 09/28/2019.    Goals Addressed   None     Follow Up Plan: SW will follow up with patient by phone over the next 2 weeks   Chicquita Mendel,  LCSW Clinical Social Worker  [facility] Care Management [phone]

## 2019-10-03 ENCOUNTER — Encounter: Payer: Self-pay | Admitting: Adult Health

## 2019-10-03 ENCOUNTER — Other Ambulatory Visit: Payer: Self-pay | Admitting: Adult Health

## 2019-10-03 MED ORDER — AMLODIPINE BESYLATE 10 MG PO TABS: 10.0000 mg | ORAL_TABLET | Freq: Every day | ORAL | 0 refills | Status: DC

## 2019-10-03 MED ORDER — LEVOTHYROXINE SODIUM 50 MCG PO TABS: 50.0000 ug | ORAL_TABLET | Freq: Every day | ORAL | 0 refills | Status: DC

## 2019-10-04 ENCOUNTER — Other Ambulatory Visit: Payer: Self-pay | Admitting: Adult Health

## 2019-10-04 ENCOUNTER — Ambulatory Visit
Admission: RE | Admit: 2019-10-04 | Discharge: 2019-10-04 | Disposition: A | Discharge status: Home / Self Care | Payer: Self-pay | Source: Ambulatory Visit | Attending: Vascular Surgery | Admitting: Vascular Surgery

## 2019-10-04 ENCOUNTER — Other Ambulatory Visit: Payer: Self-pay

## 2019-10-04 DIAGNOSIS — I701 Atherosclerosis of renal artery: Secondary | ICD-10-CM | POA: Insufficient documentation

## 2019-10-04 LAB — POCT I-STAT CREATININE: Creatinine, Ser: 0.9 mg/dL (ref 0.44–1.00)

## 2019-10-04 MED ORDER — METFORMIN HCL ER (MOD) 1000 MG PO TB24: 1000.0000 mg | ORAL_TABLET | Freq: Every day | ORAL | 0 refills | Status: DC

## 2019-10-04 MED ORDER — IOHEXOL 350 MG/ML SOLN
100.0000 mL | Freq: Once | INTRAVENOUS | Status: AC | PRN
  Administered 2019-10-04: 100 mL via INTRAVENOUS

## 2019-10-04 NOTE — Progress Notes (Signed)
Meds ordered this encounter  Medications  . metFORMIN (GLUMETZA) 1000 MG (MOD) 24 hr tablet    Sig: Take 1 tablet (1,000 mg total) by mouth daily with breakfast.    Dispense:  30 tablet    Refill:  0   Medications Discontinued During This Encounter  Medication Reason  . metFORMIN (GLUCOPHAGE) 500 MG tablet Dose change  Patient was taking 500 mg BID.  She prefers extended release and has taken in the past. She is aware to stay Hydrated and follow up for future labs ordered.

## 2019-10-05 ENCOUNTER — Other Ambulatory Visit: Payer: Self-pay | Admitting: Adult Health

## 2019-10-05 MED ORDER — METFORMIN HCL ER 500 MG PO TB24: ORAL_TABLET | ORAL | 0 refills | Status: DC

## 2019-10-05 NOTE — Telephone Encounter (Signed)
Pt calling; has questions about period; on bc; had period 2w ago; didn't have it last week; started back yesterday.  Is this normal?  [phone]  Adv it takes the body a good 40m to adjust to bc.  As long as she isn't saturating a pad every 95min-1hr it's okay.  Pt states she is cramping really bad; usually takes pamprin or midol.  Adv either one is okay. Also suggested ibuprofen; pt states she can't take that d/t her kidneys. Pt had MRI yesterday; ordered by her kidney doctor; showed 2.30mm ovarian cyst on the right ovary and 1.40mm on left ovary.  Adv will let CRS know.

## 2019-10-06 ENCOUNTER — Ambulatory Visit (INDEPENDENT_AMBULATORY_CARE_PROVIDER_SITE_OTHER): Payer: Self-pay | Admitting: Vascular Surgery

## 2019-10-06 ENCOUNTER — Other Ambulatory Visit: Payer: Self-pay

## 2019-10-06 ENCOUNTER — Encounter (INDEPENDENT_AMBULATORY_CARE_PROVIDER_SITE_OTHER): Payer: Self-pay | Admitting: Vascular Surgery

## 2019-10-06 VITALS — BP 157/98 | HR 96 | Resp 20 | Ht 65.5 in | Wt 299.0 lb

## 2019-10-06 DIAGNOSIS — I10 Essential (primary) hypertension: Secondary | ICD-10-CM

## 2019-10-06 DIAGNOSIS — E119 Type 2 diabetes mellitus without complications: Secondary | ICD-10-CM

## 2019-10-06 DIAGNOSIS — N261 Atrophy of kidney (terminal): Secondary | ICD-10-CM

## 2019-10-06 DIAGNOSIS — I701 Atherosclerosis of renal artery: Secondary | ICD-10-CM

## 2019-10-07 ENCOUNTER — Ambulatory Visit: Payer: Self-pay | Admitting: *Deleted

## 2019-10-07 DIAGNOSIS — F411 Generalized anxiety disorder: Secondary | ICD-10-CM

## 2019-10-07 DIAGNOSIS — F331 Major depressive disorder, recurrent, moderate: Secondary | ICD-10-CM

## 2019-10-07 NOTE — Chronic Care Management (AMB) (Signed)
Care Management    Clinical Social Work Follow Up Note  10/07/2019 Name: Leah Bright MRN: [id number] DOB: [birth year]  Leah Bright is a 29 y.o. year old female who is a primary care patient of Flinchum, Kelby Aline, FNP. The CCM team was consulted for assistance with Financial Difficulties related to being under-insured.   Review of patient status, including review of consultants reports, other relevant assessments, and collaboration with appropriate care team members and the patient's provider was performed as part of comprehensive patient evaluation and provision of chronic care management services.     Advanced Directives Status: <no information> See Care Plan for related entries.   Outpatient Encounter Medications as of 10/07/2019  Medication Sig  . albuterol (PROVENTIL HFA;VENTOLIN HFA) 108 (90 Base) MCG/ACT inhaler Inhale 2 puffs into the lungs every 6 (six) hours as needed for wheezing or shortness of breath.  [name] amLODipine (NORVASC) 10 MG tablet Take 1 tablet (10 mg total) by mouth daily.  . busPIRone (BUSPAR) 5 MG tablet Take 5 mg by mouth 2 (two) times daily.  . butalbital-acetaminophen-caffeine (FIORICET) 50-325-40 MG tablet Take 1 tablet by mouth every 6 (six) hours as needed for headache.  . levothyroxine (SYNTHROID) 50 MCG tablet Take 1 tablet (50 mcg total) by mouth daily.  . metFORMIN (GLUCOPHAGE XR) 500 MG 24 hr tablet Take two 500 mg tablets (total of 1000mg  ) by mouth with breakfast once daily.  . metoprolol succinate (TOPROL XL) 25 MG 24 hr tablet Take 1 tablet (25 mg total) by mouth daily. Check heart rate prior and do not take if heart rate is less than 60 beats per minute.  . norethindrone (MICRONOR) 0.35 MG tablet Take 1 tablet (0.35 mg total) by mouth daily.  . ondansetron (ZOFRAN-ODT) 4 MG disintegrating tablet Take 1-2 tablets (4-8 mg total) by mouth every 8 (eight) hours as needed for nausea.  . traZODone (DESYREL) 100 MG tablet Take 100 mg by mouth at bedtime as  needed for sleep.  [name] venlafaxine XR (EFFEXOR-XR) 75 MG 24 hr capsule Take 75 mg by mouth at bedtime.   No facility-administered encounter medications on file as of 10/07/2019.     Goals Addressed            This Visit's Progress   . "I need a CT scan of my kidneys" (pt-stated)       Current Barriers:  . Financial constraints related to lack of insurance  to cover needed medical follow up  Clinical Social Work Clinical Goal(s):  [name] Over the next 90 days, patient will work on completing a medicaid application to cover needed medical follow up  Interventions: . Provided patient with information about applying for [facility] through [facility] to assist with needed medical follow up . Patient confirmed that she has received the medicaid application and has started to complete it but needs help from her husband to complete the rest of it . Confirmed with patient that once completed, this social worker will inform her of where to send it back to . Patient discussed feeling down today, reporting no thoughts of self harm . Patient confirmed that patient with now be going to counseling [employer] [employer]) weekly (1 week virtual , 1 week in person) . Provided patient with emotional support and encouragement to continue to use coping strategies learned . Discussed plans with patient for ongoing care management follow up and provided patient with direct contact information for care management team   Patient Self Care Activities:  .  Patient verbalizes understanding of plan to completed Medicaid application once received . Unable to perform IADLs independently . Knowledge deficit related to community resources to meet her medical needs  Please see past updates related to this goal by clicking on the "Past Updates" button in the selected goal          Follow Up Plan: SW will follow up with patient by phone over the next 14 business days    [place], [place] Clinical Social Worker    [facility] Care Management [phone]

## 2019-10-07 NOTE — Patient Instructions (Signed)
Thank you allowing the Chronic Care Management Team to be a part of your care! It was a pleasure speaking with you today!  1. Please complete the Medicaid application received. Please call this social worker if you have any questions regarding the completion of the Medicaid application.  CCM (Chronic Care Management) Team   [name] RN, BSN Nurse Care Coordinator  [phone]  Karalee Height PharmD  Clinical Pharmacist  [phone]   Verna Czech, LCSW Clinical Social Worker [phone]  Goals Addressed            This Visit's Progress   . "I need a CT scan of my kidneys" (pt-stated)       Current Barriers:  . Financial constraints related to lack of insurance  to cover needed medical follow up  Clinical Social Work Clinical Goal(s):  [name] Over the next 90 days, patient will work on completing a medicaid application to cover needed medical follow up  Interventions: . Provided patient with information about applying for [facility] through [facility] to assist with needed medical follow up . Patient confirmed that she has received the medicaid application and has started to complete it but needs help from her husband to complete the rest of it . Confirmed with patient that once completed, this social worker will inform her of where to send it back to . Patient discussed feeling down today, reporting no thoughts of self harm . Patient confirmed that patient with now be going to counseling [employer]) weekly (1 week virtual , 1 week in person) . Provided patient with emotional support and encouragement to continue to use coping strategies learned . Discussed plans with patient for ongoing care management follow up and provided patient with direct contact information for care management team   Patient Self Care Activities:  . Patient verbalizes understanding of plan to completed Medicaid application once received . Unable to perform IADLs  independently . Knowledge deficit related to community resources to meet her medical needs  Please see past updates related to this goal by clicking on the "Past Updates" button in the selected goal          The patient verbalized understanding of instructions provided today and declined a print copy of patient instruction materials.   Telephone follow up appointment with care management team member scheduled for: 10/16/18

## 2019-10-09 ENCOUNTER — Encounter (INDEPENDENT_AMBULATORY_CARE_PROVIDER_SITE_OTHER): Payer: Self-pay | Admitting: Vascular Surgery

## 2019-10-09 NOTE — Progress Notes (Signed)
MRN : [id number]  Leah Bright is a 29 y.o. (Mar 20, 1991) female who presents with chief complaint of  Chief Complaint  Patient presents with  . Follow-up    review CTA  .  History of Present Illness:   The patient returns to the office for followup and review of the noninvasive studies regarding renal vascular hypertension and renal artery stenosis. There have been no interval changes in the patient's blood pressure control.  He denies any major changes in is medications.  The patient denies headache or flushing.  No flank or unusual back pain.    There have been no significant changes to the patient's overall health care.  No interval shortening of the patient's walking distance or new symptoms consistent with claudication.  The patient denies the  development of rest pain symptoms. No new ulcers or wounds have occurred since the last visit.  The patient denies amaurosis fugax or recent TIA symptoms. There are no recent neurological changes noted. The patient denies history of DVT, PE or superficial thrombophlebitis. The patient denies recent episodes of angina or shortness of breath.   CT of the abdominal aorta and renal arteries shows the left renal artery is widely patent no stenosis, right renal atrophy is again noted   Current Meds  Medication Sig  . albuterol (PROVENTIL HFA;VENTOLIN HFA) 108 (90 Base) MCG/ACT inhaler Inhale 2 puffs into the lungs every 6 (six) hours as needed for wheezing or shortness of breath.  [name] amLODipine (NORVASC) 10 MG tablet Take 1 tablet (10 mg total) by mouth daily.  . busPIRone (BUSPAR) 5 MG tablet Take 5 mg by mouth 2 (two) times daily.  . butalbital-acetaminophen-caffeine (FIORICET) 50-325-40 MG tablet Take 1 tablet by mouth every 6 (six) hours as needed for headache.  . levothyroxine (SYNTHROID) 50 MCG tablet Take 1 tablet (50 mcg total) by mouth daily.  . metFORMIN (GLUCOPHAGE XR) 500 MG 24 hr tablet Take two 500 mg tablets (total of 1000mg  )  by mouth with breakfast once daily.  . metoprolol succinate (TOPROL XL) 25 MG 24 hr tablet Take 1 tablet (25 mg total) by mouth daily. Check heart rate prior and do not take if heart rate is less than 60 beats per minute.  . norethindrone (MICRONOR) 0.35 MG tablet Take 1 tablet (0.35 mg total) by mouth daily.  . ondansetron (ZOFRAN-ODT) 4 MG disintegrating tablet Take 1-2 tablets (4-8 mg total) by mouth every 8 (eight) hours as needed for nausea.  . traZODone (DESYREL) 100 MG tablet Take 100 mg by mouth at bedtime as needed for sleep.  venlafaxine XR (EFFEXOR-XR) 75 MG 24 hr capsule Take 75 mg by mouth at bedtime.    Past Medical History:  Diagnosis Date  . Agitation   . Allergy   . Anxiety   . Asthma   . Chronic kidney disease   . Depression   . Diabetes mellitus without complication (HCC)   . High cholesterol   . Hypertension   . Hypothyroidism   . Left renal artery stenosis (HCC) 09/07/2019  . Migraine   . OCD (obsessive compulsive disorder)   . PTSD (post-traumatic stress disorder)   . Renal artery stenosis (HCC)   . Right renal atrophy   . Social anxiety disorder   . Social anxiety disorder   . Social phobia   . Thyroid disease     Past Surgical History:  Procedure Laterality Date  . CHOLECYSTECTOMY  2008  . endoscopy/colonoscopy   2011  .  LAPAROSCOPIC GASTRIC BANDING  2007  . LAPAROSCOPIC REPAIR AND REMOVAL OF GASTRIC BAND  2011  . TONSILLECTOMY  1994  . WISDOM TOOTH EXTRACTION      Social History Social History   Tobacco Use  . Smoking status: Never Smoker  . Smokeless tobacco: Never Used  Substance Use Topics  . Alcohol use: Yes    Comment: rarely, maybe 1 shot every 2-3 months  . Drug use: Never    Family History Family History  Problem Relation Age of Onset  . Cancer Mother        brain tumor  . Hypertension Mother   . Depression Mother   . Diabetes Mother   . Migraines Mother        benign tumor, still gets headaches sometimes now   .  Anxiety disorder Father   . Depression Father   . Hypertension Maternal Grandmother   . Thyroid disease Maternal Grandmother   . Diabetes Maternal Grandmother   . Hypertension Maternal Grandfather   . Cancer Maternal Grandfather        myositis  . High blood pressure Maternal Grandfather   . Diabetes Maternal Grandfather   . Stroke Maternal Grandfather     Allergies  Allergen Reactions  . Toradol [Ketorolac Tromethamine] Hives  . Codeine Rash  . Other Rash    Cilantro and Cedar trees  . Pepto-Bismol [Bismuth] Rash  . Tramadol Rash     REVIEW OF SYSTEMS (Negative unless checked)  Constitutional: [] Weight loss  [] Fever  [] Chills Cardiac: [] Chest pain   [] Chest pressure   [] Palpitations   [] Shortness of breath when laying flat   [] Shortness of breath with exertion. Vascular:  [] Pain in legs with walking   [] Pain in legs at rest  [] History of DVT   [] Phlebitis   [] Swelling in legs   [] Varicose veins   [] Non-healing ulcers Pulmonary:   [] Uses home oxygen   [] Productive cough   [] Hemoptysis   [] Wheeze  [] COPD   [] Asthma Neurologic:  [] Dizziness   [] Seizures   [] History of stroke   [] History of TIA  [] Aphasia   [] Vissual changes   [] Weakness or numbness in arm   [] Weakness or numbness in leg Musculoskeletal:   [] Joint swelling   [] Joint pain   [] Low back pain Hematologic:  [] Easy bruising  [] Easy bleeding   [] Hypercoagulable state   [] Anemic Gastrointestinal:  [] Diarrhea   [] Vomiting  [] Gastroesophageal reflux/heartburn   [] Difficulty swallowing. Genitourinary:  [] Chronic kidney disease   [] Difficult urination  [] Frequent urination   [] Blood in urine Skin:  [] Rashes   [] Ulcers  Psychological:  [] History of anxiety   []  History of major depression.  Physical Examination  Vitals:   10/06/19 1517  BP: (!) 157/98  Pulse: 96  Resp: 20  Weight: 299 lb (135.6 kg)  Height: 5' 5.5" (1.664 m)   Body mass index is 49 kg/m. Gen: WD/WN, NAD Head: [place]/AT, No temporalis wasting.    Ear/Nose/Throat: Hearing grossly intact, nares w/o erythema or drainage Eyes: PER, EOMI, sclera nonicteric.  Neck: Supple, no large masses.   Pulmonary:  Good air movement, no audible wheezing bilaterally, no use of accessory muscles.  Cardiac: RRR, no JVD Vascular:  Vessel Right Left  Radial Palpable Palpable  Gastrointestinal: Non-distended. No guarding/no peritoneal signs.  Musculoskeletal: M/S 5/5 throughout.  No deformity or atrophy.  Neurologic: CN 2-12 intact. Symmetrical.  Speech is fluent. Motor exam as listed above. Psychiatric: Judgment intact, Mood & affect appropriate for pt's clinical situation. Dermatologic: No rashes  or ulcers noted.  No changes consistent with cellulitis. Lymph : No lichenification or skin changes of chronic lymphedema.  CBC Lab Results  Component Value Date   WBC 7.3 07/19/2019   HGB 14.5 07/19/2019   HCT 43.7 07/19/2019   MCV 82 07/19/2019   PLT 268 07/19/2019    BMET    Component Value Date/Time   NA 140 07/19/2019 1224   K 4.2 07/19/2019 1224   CL 102 07/19/2019 1224   CO2 25 10/26/2018 1114   GLUCOSE 236 (H) 07/19/2019 1224   GLUCOSE 135 (H) 10/26/2018 1114   BUN 10 07/19/2019 1224   CREATININE 0.90 10/04/2019 1539   CALCIUM 10.1 07/19/2019 1224   GFRNONAA 98 07/19/2019 1224   GFRAA 113 07/19/2019 1224   Estimated Creatinine Clearance: 128.7 mL/min (by C-G formula based on SCr of 0.9 mg/dL).  COAG No results found for: INR, PROTIME  Radiology CT Angio Abd/Pel w/ and/or w/o  Result Date: 10/04/2019 CLINICAL DATA:  Hypertension with associated right renal atrophy and suspected left-sided renal artery stenosis. EXAM: CTA ABDOMEN AND PELVIS WITHOUT AND WITH CONTRAST TECHNIQUE: Multidetector CT imaging of the abdomen and pelvis was performed using the standard protocol during bolus administration of intravenous contrast. Multiplanar reconstructed images and MIPs were obtained and reviewed to evaluate the vascular anatomy. CONTRAST:   121mL OMNIPAQUE IOHEXOL 350 MG/ML SOLN COMPARISON:  Renal Doppler ultrasound - 09/07/2019; CT abdomen pelvis-05/13/2018 FINDINGS: VASCULAR Aorta: Normal caliber of the abdominal aorta. No evidence of abdominal aortic dissection or periaortic stranding. Celiac: Widely patent without a hemodynamically significant narrowing. Conventional branching pattern. SMA: Widely patent without hemodynamically significant narrowing. Conventional branching pattern. The distal tributaries of the SMA are widely patent without discrete intraluminal filling defects to suggest distal embolism. Renals: Solitary bilaterally; the right renal artery is expectedly atrophic as supplies the asymmetrically atrophic right kidney, however remains widely patent without a hemodynamically significant narrowing. The left renal artery is of normal caliber and widely patent without hemodynamically significant narrowing. No discrete areas of vessel irregularity to suggest FMD. IMA: Widely patent without a hemodynamically significant narrowing. Inflow: The bilateral common, external and internal iliac arteries are of normal caliber and widely patent without a hemodynamically significant narrowing. Proximal Outflow: The bilateral common and imaged portions of the bilateral deep and superficial femoral arteries are normal caliber and widely patent without hemodynamically significant narrowing. Veins: The IVC and pelvic venous systems appear widely patent as do the bilateral renal veins. Review of the MIP images confirms the above findings. _________________________________________________________ NON-VASCULAR Lower chest: Limited visualization of the lower thorax is negative for focal airspace opacity or pleural effusion. Borderline cardiomegaly.  No pericardial effusion. Hepatobiliary: Normal hepatic contour. No discrete hyperenhancing hepatic lesions. Post cholecystectomy. No intra extrahepatic biliary duct dilatation. No ascites. Pancreas: Normal  appearance of the pancreas. Spleen: Normal appearance of the spleen. Adrenals/Urinary Tract: Redemonstrated asymmetric atrophy of the right kidney in comparison to the left. No definite renal stones this postcontrast examination. No discrete renal lesions. No urinary obstruction or perinephric stranding. Normal appearance the bilateral adrenal glands. Normal appearance of the urinary bladder. Stomach/Bowel: Moderate colonic stool burden without evidence of enteric obstruction. Normal appearance of the terminal ileum and appendix. No discrete areas of bowel wall thickening. No hiatal hernia. No pneumoperitoneum, pneumatosis or portal venous gas. Lymphatic: Scattered mesenteric lymph nodes are numerous though individually not enlarged by size criteria, presumably reactive in etiology. No bulky retroperitoneal, mesenteric, pelvic or inguinal lymphadenopathy. Reproductive: Normal appearance of the pelvic organs. Note  is made of bilateral hypoattenuating presumably physiologic adnexal cysts, the left measuring 2.9 cm (image 166, series 4, and the right measuring 1.6 cm (image 172, series 4). No free fluid the pelvic cul-de-sac. Other: Minimal amount of subcutaneous edema about the midline of the low back. Musculoskeletal: No acute or aggressive osseous abnormalities. A limbus body is noted about the anterior superior aspect of the L3 vertebral body. IMPRESSION: 1. No evidence of renal artery stenosis. 2. 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 3. No CT evidence of FMD. 4. Bilateral presumably physiologic adnexal cysts. Electronically Signed   By: Simonne Come M.D.   On: 10/04/2019 16:33     Assessment/Plan 1. Essential hypertension Continue antihypertensive medications as already ordered, these medications have been reviewed and there are no changes at this time.  There is no evidence of renal  artery stenosis by CTA  2. Stenosis of left renal artery (HCC) See #1  3. Type 2 diabetes mellitus without complication, without long-term current use of insulin (HCC) Continue hypoglycemic medications as already ordered, these medications have been reviewed and there are no changes at this time.  Hgb A1C to be monitored as already arranged by primary service   4. Renal atrophy, right No vascular intervention at this time   Levora Dredge, MD  10/09/2019 5:14 PM

## 2019-10-13 ENCOUNTER — Encounter: Payer: Self-pay | Admitting: Adult Health

## 2019-10-13 ENCOUNTER — Other Ambulatory Visit: Payer: Self-pay

## 2019-10-13 ENCOUNTER — Ambulatory Visit (INDEPENDENT_AMBULATORY_CARE_PROVIDER_SITE_OTHER): Payer: Self-pay | Admitting: Adult Health

## 2019-10-13 VITALS — BP 130/82 | HR 103 | Temp 98.2°F | Resp 16 | Wt 302.0 lb

## 2019-10-13 DIAGNOSIS — H00014 Hordeolum externum left upper eyelid: Secondary | ICD-10-CM | POA: Insufficient documentation

## 2019-10-13 DIAGNOSIS — E119 Type 2 diabetes mellitus without complications: Secondary | ICD-10-CM

## 2019-10-13 DIAGNOSIS — I10 Essential (primary) hypertension: Secondary | ICD-10-CM

## 2019-10-13 LAB — POCT GLYCOSYLATED HEMOGLOBIN (HGB A1C): Hemoglobin A1C: 9.1 % — AB (ref 4.0–5.6)

## 2019-10-13 MED ORDER — CEPHALEXIN 500 MG PO CAPS: 500.0000 mg | ORAL_CAPSULE | Freq: Three times a day (TID) | ORAL | 0 refills | Status: DC

## 2019-10-13 MED ORDER — ERYTHROMYCIN 5 MG/GM OP OINT: 1.0000 "application " | TOPICAL_OINTMENT | Freq: Four times a day (QID) | OPHTHALMIC | 0 refills | Status: DC

## 2019-10-13 NOTE — Patient Instructions (Addendum)
Artificial Tears eye ointment What is this medicine? ARTIFICIAL TEARS (ahr tuh FISH uhl teerz) soothes irritation and discomfort caused by dry eyes. This medicine may be used for other purposes; ask your health care provider or pharmacist if you have questions. COMMON BRAND NAME(S): Advanced Eye Relief Night Time, Altalube, Artificial Tears, Eye Lubricant, GenTeal Night-time PM, GenTeal PM, GenTeal Tears Severe Night-Time, Lacrilube SOP, Lubricant PM, LubriFresh P.M., Moisture Eyes PM, Puralube, Refresh P.M., Soothe Night Time, Sterilube Nighttime Relief, Stye Sterile Lubricant, Systane Nighttime, Tears Naturale PM What should I tell my health care provider before I take this medicine?  change in vision  eye infection or trauma  wear contact lenses  an unusual or allergic reaction to artificial tears, other medicines, foods, dyes, or preservatives  pregnant or trying to get pregnant  breast-feeding How should I use this medicine? This medicine is only for use in the eye. Do not take by mouth. Follow the directions on the label. Wash hands before and after use. Pull down the lower lid of the affected eye and apply a small amount of ointment, roughly one fourth inch, to the inside of the eyelid. Close the eye gently for a few moments to allow contact with the eye. Wipe away any residue with a clean tissue. Use your medicine at regular intervals. Do not use your medicine more often than directed. Talk to your pediatrician regarding the use of this medicine in children. While this medicine may be used in children as young as 6 years for selected conditions, precautions do apply. Overdosage: If you think you have taken too much of this medicine contact a poison control center or emergency room at once. NOTE: This medicine is only for you. Do not share this medicine with others. What if I miss a dose? If you miss a dose, use it as soon as you can. If it is almost  time for your next dose, use only that dose. Do not use double or extra doses. What may interact with this medicine? Interactions are not expected. If you are also using eye drops of any type, use the drops roughly 10 minutes before application of the eye ointment so that the eye ointment does not interfere with the action of the drops. This list may not describe all possible interactions. Give your health care provider a list of all the medicines, herbs, non-prescription drugs, or dietary supplements you use. Also tell them if you smoke, drink alcohol, or use illegal drugs. Some items may interact with your medicine. What should I watch for while using this medicine? If you experience eye pain, changes in vision, continued redness or irritation of the eye, or if your eye condition gets worse or lasts longer than 72 hours, discontinue use and consult your health care professional. To avoid contamination of this product, do not touch the tip of the container to any surface. Do not share this medicine with others. If the product changes color do not use. If you wear contact lenses, you should remove them before putting the ointment in your eyes. Wait at least 15 minutes after putting the ointment in your eyes before putting your contact lenses back in. What side effects may I notice from receiving this medicine? Side effects that you should report to your doctor or health care professional as soon as possible:  allergic reactions like skin rash, itching  or hives, swelling of the face, lips, or tongue  change in vision  eye irritation or redness that gets worse or lasts more than 72 hours  eye pain Side effects that usually do not require medical attention (report to your doctor or health care professional if they continue or are bothersome):  temporary stinging or blurred vision when applying the eye drops This list may not describe all possible side effects. Call your doctor for medical advice  about side effects. You may report side effects to FDA at 1-800-FDA-1088. Where should I keep my medicine? Keep out of the reach of children. Store at room temperature between 15 and 30 degrees C (59 and 86 degrees F). Do not freeze. Throw away any unused medicine after the expiration date. Once the product is opened, most experts recommend discarding the product after 30 days. NOTE: This sheet is a summary. It may not cover all possible information. If you have questions about this medicine, talk to your doctor, pharmacist, or health care provider.  2020 Elsevier/Gold Standard (2008-02-18 14:24:35) Stye  A stye, also known as a hordeolum, is a bump that forms on an eyelid. It may look like a pimple next to the eyelash. A stye can form inside the eyelid (internal stye) or outside the eyelid (external stye). A stye can cause redness, swelling, and pain on the eyelid. Styes are very common. Anyone can get them at any age. They usually occur in just one eye, but you may have more than one in either eye. What are the causes? A stye is caused by an infection. The infection is almost always caused by bacteria called Staphylococcus aureus. This is a common type of bacteria that lives on the skin. An internal stye may result from an infected oil-producing gland inside the eyelid. An external stye may be caused by an infection at the base of the eyelash (hair follicle). What increases the risk? You are more likely to develop a stye if:  You have had a stye before.  You have any of these conditions: ? Diabetes. ? Red, itchy, inflamed eyelids (blepharitis). ? A skin condition such as seborrheic dermatitis or rosacea. ? High fat levels in your blood (lipids). What are the signs or symptoms? The most common symptom of a stye is eyelid pain. Internal styes are more painful than external styes. Other symptoms may include:  Painful swelling of your eyelid.  A scratchy feeling in your eye.  Tearing and  redness of your eye.  Pus draining from the stye. How is this diagnosed? Your health care provider may be able to diagnose a stye just by examining your eye. The health care provider may also check to make sure:  You do not have a fever or other signs of a more serious infection.  The infection has not spread to other parts of your eye or areas around your eye. How is this treated? Most styes will clear up in a few days without treatment or with warm compresses applied to the area. You may need to use antibiotic drops or ointment to treat an infection. In some cases, if your stye does not heal with routine treatment, your health care provider may drain pus from the stye using a thin blade or needle. This may be done if the stye is large, causing a lot of pain, or affecting your vision. Follow these instructions at home:  Take over-the-counter and prescription medicines only as told by your health care provider. This includes eye  drops or ointments.  If you were prescribed an antibiotic medicine, apply or use it as told by your health care provider. Do not stop using the antibiotic even if your condition improves.  Apply a warm, wet cloth (warm compress) to your eye for 5-10 minutes, 4 times a day.  Clean the affected eyelid as directed by your health care provider.  Do not wear contact lenses or eye makeup until your stye has healed.  Do not try to pop or drain the stye.  Do not rub your eye. Contact a health care provider if:  You have chills or a fever.  Your stye does not go away after several days.  Your stye affects your vision.  Your eyeball becomes swollen, red, or painful. Get help right away if:  You have pain when moving your eye around. Summary  A stye is a bump that forms on an eyelid. It may look like a pimple next to the eyelash.  A stye can form inside the eyelid (internal stye) or outside the eyelid (external stye). A stye can cause redness, swelling, and  pain on the eyelid.  Your health care provider may be able to diagnose a stye just by examining your eye.  Apply a warm, wet cloth (warm compress) to your eye for 5-10 minutes, 4 times a day. This information is not intended to replace advice given to you by your health care provider. Make sure you discuss any questions you have with your health care provider. Document Revised: 07/31/2017 Document Reviewed: 04/30/2017 Elsevier Patient Education  2020 [employer] Erythromycin eye ointment What is this medicine? ERYTHROMYCIN (er ith roe MYE sin) is a macrolide antibiotic. It is used to treat bacterial eye infections. It also prevents a certain type of eye infection that can occur in some babies. This medicine may be used for other purposes; ask your health care provider or pharmacist if you have questions. COMMON BRAND NAME(S): Ilotycin, Romycin What should I tell my health care provider before I take this medicine?  if you have an unusual or allergic reaction to erythromycin, foods, dyes, or preservatives  pregnant or trying to get pregnant  breast-feeding How should I use this medicine? This medicine is only for use in the eye. Follow the directions on the prescription label. Wash hands before and after use. Tilt your head back slightly and pull your lower eyelid down with your index finger to form a pouch. Try not to touch the tip of the tube, to your eye, fingertips, or any other surface. Squeeze the end of the tube to apply a thin layer of the ointment to the inside of the lower eyelid. Close the eye gently to spread the ointment. Your vision may blur for a few minutes. Use your doses at regular intervals. Do not use your medicine more often than directed. Finish the full course prescribed by your doctor or health care professional even if you think your condition is better. Do not stop using except on the advice of your doctor or health care professional. Talk to your pediatrician  regarding the use of this medicine in children. Special care may be needed. Overdosage: If you think you have taken too much of this medicine contact a poison control center or emergency room at once. NOTE: This medicine is only for you. Do not share this medicine with others. What if I miss a dose? If you miss a dose, use it as soon as you can. If it is almost time  for your next dose, use only that dose. Do not use double or extra doses. What may interact with this medicine? Interactions are not expected. Do not use any other eye products without telling your doctor or health care professional. This list may not describe all possible interactions. Give your health care provider a list of all the medicines, herbs, non-prescription drugs, or dietary supplements you use. Also tell them if you smoke, drink alcohol, or use illegal drugs. Some items may interact with your medicine. What should I watch for while using this medicine? Tell your doctor or health care professional if your symptoms do not improve in 2 to 3 days. What side effects may I notice from receiving this medicine? Side effects that you should report to your doctor or health care professional as soon as possible:  allergic reactions like skin rash, itching or hives, swelling of the face, lips, or tongue  burning, stinging, or itching of the eyes or eyelids  changes in vision  redness, swelling, or pain This list may not describe all possible side effects. Call your doctor for medical advice about side effects. You may report side effects to FDA at 1-800-FDA-1088. Where should I keep my medicine? Keep out of the reach of children. Store at room temperature between 15 and 30 degrees C (59 and 86 degrees F). Do not freeze. Throw away any unused ointment after the expiration date. NOTE: This sheet is a summary. It may not cover all possible information. If you have questions about this medicine, talk to your doctor, pharmacist, or  health care provider.  2020 Elsevier/Gold Standard (2015-09-20 12:24:24) Cephalexin Tablets or Capsules What is this medicine? CEPHALEXIN (sef a LEX in) is a cephalosporin antibiotic. It treats some infections caused by bacteria. It will not work for colds, the flu, or other viruses. This medicine may be used for other purposes; ask your health care provider or pharmacist if you have questions. COMMON BRAND NAME(S): Biocef, Daxbia, Keflex, Keftab What should I tell my health care provider before I take this medicine? They need to know if you have any of these conditions:  kidney disease  stomach or intestine problems, especially colitis  an unusual or allergic reaction to cephalexin, other cephalosporins, penicillins, other antibiotics, medicines, foods, dyes or preservatives  pregnant or trying to get pregnant  breast-feeding How should I use this medicine? Take this drug by mouth. Take it as directed on the prescription label at the same time every day. You can take it with or without food. If it upsets your stomach, take it with food. Take all of this drug unless your health care provider tells you to stop it early. Keep taking it even if you think you are better. Talk to your health care provider about the use of this drug in children. While it may be prescribed for selected conditions, precautions do apply. Overdosage: If you think you have taken too much of this medicine contact a poison control center or emergency room at once. NOTE: This medicine is only for you. Do not share this medicine with others. What if I miss a dose? If you miss a dose, take it as soon as you can. If it is almost time for your next dose, take only that dose. Do not take double or extra doses. What may interact with this medicine?  probenecid  some other antibiotics This list may not describe all possible interactions. Give your health care provider a list of all the medicines, herbs, non-prescription  drugs, or dietary supplements you use. Also tell them if you smoke, drink alcohol, or use illegal drugs. Some items may interact with your medicine. What should I watch for while using this medicine? Tell your doctor or health care provider if your symptoms do not begin to improve in a few days. This medicine may cause serious skin reactions. They can happen weeks to months after starting the medicine. Contact your health care provider right away if you notice fevers or flu-like symptoms with a rash. The rash may be red or purple and then turn into blisters or peeling of the skin. Or, you might notice a red rash with swelling of the face, lips or lymph nodes in your neck or under your arms. Do not treat diarrhea with over the counter products. Contact your doctor if you have diarrhea that lasts more than 2 days or if it is severe and watery. If you have diabetes, you may get a false-positive result for sugar in your urine. Check with your doctor or health care provider. What side effects may I notice from receiving this medicine? Side effects that you should report to your doctor or health care professional as soon as possible:  allergic reactions like skin rash, itching or hives, swelling of the face, lips, or tongue  breathing problems  pain or trouble passing urine  redness, blistering, peeling or loosening of the skin, including inside the mouth  severe or watery diarrhea  unusually weak or tired  yellowing of the eyes, skin Side effects that usually do not require medical attention (report to your doctor or health care professional if they continue or are bothersome):  gas or heartburn  genital or anal irritation  headache  joint or muscle pain  nausea, vomiting This list may not describe all possible side effects. Call your doctor for medical advice about side effects. You may report side effects to FDA at 1-800-FDA-1088. Where should I keep my medicine? Keep out of the reach  of children and pets. Store at room temperature between 20 and 25 degrees C (68 and 77 degrees F). Throw away any unused drug after the expiration date. NOTE: This sheet is a summary. It may not cover all possible information. If you have questions about this medicine, talk to your doctor, pharmacist, or health care provider.  2020 Elsevier/Gold Standard (2019-03-25 11:27:00) Diabetes Mellitus and Exercise Exercising regularly is important for your overall health, especially when you have diabetes (diabetes mellitus). Exercising is not only about losing weight. It has many other health benefits, such as increasing muscle strength and bone density and reducing body fat and stress. This leads to improved fitness, flexibility, and endurance, all of which result in better overall health. Exercise has additional benefits for people with diabetes, including:  Reducing appetite.  Helping to lower and control blood glucose.  Lowering blood pressure.  Helping to control amounts of fatty substances (lipids) in the blood, such as cholesterol and triglycerides.  Helping the body to respond better to insulin (improving insulin sensitivity).  Reducing how much insulin the body needs.  Decreasing the risk for heart disease by: ? Lowering cholesterol and triglyceride levels. ? Increasing the levels of good cholesterol. ? Lowering blood glucose levels. What is my activity plan? Your health care provider or certified diabetes educator can help you make a plan for the type and frequency of exercise (activity plan) that works for you. Make sure that you:  Do at least 150 minutes of moderate-intensity or vigorous-intensity exercise  each week. This could be brisk walking, biking, or water aerobics. ? Do stretching and strength exercises, such as yoga or weightlifting, at least 2 times a week. ? Spread out your activity over at least 3 days of the week.  Get some form of physical activity every day. ? Do  not go more than 2 days in a row without some kind of physical activity. ? Avoid being inactive for more than 30 minutes at a time. Take frequent breaks to walk or stretch.  Choose a type of exercise or activity that you enjoy, and set realistic goals.  Start slowly, and gradually increase the intensity of your exercise over time. What do I need to know about managing my diabetes?   Check your blood glucose before and after exercising. ? If your blood glucose is 240 mg/dL (16.113.3 mmol/L) or higher before you exercise, check your urine for ketones. If you have ketones in your urine, do not exercise until your blood glucose returns to normal. ? If your blood glucose is 100 mg/dL (5.6 mmol/L) or lower, eat a snack containing 15-20 grams of carbohydrate. Check your blood glucose 15 minutes after the snack to make sure that your level is above 100 mg/dL (5.6 mmol/L) before you start your exercise.  Know the symptoms of low blood glucose (hypoglycemia) and how to treat it. Your risk for hypoglycemia increases during and after exercise. Common symptoms of hypoglycemia can include: ? Hunger. ? Anxiety. ? Sweating and feeling clammy. ? Confusion. ? Dizziness or feeling light-headed. ? Increased heart rate or palpitations. ? Blurry vision. ? Tingling or numbness around the mouth, lips, or tongue. ? Tremors or shakes. ? Irritability.  Keep a rapid-acting carbohydrate snack available before, during, and after exercise to help prevent or treat hypoglycemia.  Avoid injecting insulin into areas of the body that are going to be exercised. For example, avoid injecting insulin into: ? The arms, when playing tennis. ? The legs, when jogging.  Keep records of your exercise habits. Doing this can help you and your health care provider adjust your diabetes management plan as needed. Write down: ? Food that you eat before and after you exercise. ? Blood glucose levels before and after you exercise. ? The  type and amount of exercise you have done. ? When your insulin is expected to peak, if you use insulin. Avoid exercising at times when your insulin is peaking.  When you start a new exercise or activity, work with your health care provider to make sure the activity is safe for you, and to adjust your insulin, medicines, or food intake as needed.  Drink plenty of water while you exercise to prevent dehydration or heat stroke. Drink enough fluid to keep your urine clear or pale yellow. Summary  Exercising regularly is important for your overall health, especially when you have diabetes (diabetes mellitus).  Exercising has many health benefits, such as increasing muscle strength and bone density and reducing body fat and stress.  Your health care provider or certified diabetes educator can help you make a plan for the type and frequency of exercise (activity plan) that works for you.  When you start a new exercise or activity, work with your health care provider to make sure the activity is safe for you, and to adjust your insulin, medicines, or food intake as needed. This information is not intended to replace advice given to you by your health care provider. Make sure you discuss any questions you have with  your health care provider. Document Revised: 03/12/2017 Document Reviewed: 01/28/2016 Elsevier Patient Education  2020 [employer]. +

## 2019-10-13 NOTE — Progress Notes (Signed)
Hemoglobin A1C down 11.4 to 9.1. follow up 3 months.

## 2019-10-13 NOTE — Progress Notes (Signed)
Patient: Leah Bright Female    DOB: 1991/06/09   28 y.o.   MRN: [id number] Visit Date: 10/13/2019  Today's Provider: Jairo Ben, FNP   Chief Complaint  Patient presents with  . Hypertension  . Diabetes   Subjective:     Eye Pain  Both eyes are affected.This is a new problem. The current episode started yesterday. The problem occurs constantly. The problem has been unchanged. There was no injury mechanism. The pain is moderate. There is no known exposure to pink eye. She wears contacts. Associated symptoms include blurred vision, eye redness and itching. Pertinent negatives include no eye discharge, double vision, fever, foreign body sensation, nausea, photophobia, recent URI or vomiting. She has tried nothing for the symptoms.    Hearing Screening   125Hz  250Hz  500Hz  1000Hz  2000Hz  3000Hz  4000Hz  6000Hz  8000Hz   Right ear:           Left ear:             Visual Acuity Screening   Right eye Left eye Both eyes  Without correction:     With correction: 20/40 20/25 20/25   She has her glasses on which she reports she can not see in them as she did not buy new glasses, she did not put her contacts in due to eye irritation in left eye. Itches and burning with and painful eye lid. She reports she has normal vision with her contacts and today her vision is not changed.  Onset reported to me 3 days ago, has had yellow drainage sticking her eye lashes together. Denies any trauma to eye. Has mild erythema of eye lid and small surrounding area. Denies any trauma or chemosis.    Hypertension, follow-up:  BP Readings from Last 3 Encounters:  10/13/19 130/82  10/06/19 (!) 157/98  09/20/19 (!) 142/102    She was last seen for hypertension 3 weeks ago.  BP at that visit was 142/102. Management changes since that visit include starting patient on Metoprolol 25mg . She reports excellent compliance with treatment. She is not having side effects.  She is not exercising. She  is adherent to low salt diet.   Outside blood pressures are not being checked outside of office. She is experiencing none.  Patient denies chest pain, chest pressure/discomfort, claudication, dyspnea, exertional chest pressure/discomfort, fatigue, irregular heart beat, lower extremity edema, near-syncope, orthopnea, palpitations, paroxysmal nocturnal dyspnea, syncope and tachypnea.   Cardiovascular risk factors include diabetes mellitus, hypertension and obesity (BMI >= 30 kg/m2).  Use of agents associated with hypertension: none.     Weight trend: stable Wt Readings from Last 3 Encounters:  10/13/19 (!) 302 lb (137 kg)  10/06/19 299 lb (135.6 kg)  09/20/19 (!) 301 lb (136.5 kg)    Current diet: in general, a "healthy" diet    ------------------------------------------------------------------------  Diabetes Mellitus Type II, Follow-up:   Lab Results  Component Value Date   HGBA1C 9.1 (A) 10/13/2019   HGBA1C 11.4 (H) 07/19/2019   HGBA1C 8.5 (H) 10/26/2018    Last seen for diabetes 3 weeks ago.  Management since then includes starting patient back on Metformin XR 500 mg two tablets in the morning she just started this on Monday of this week. . Patient states that she was unable to pick up prescription due to cost and states that her husband is on Metformin XR 500mg  so she has been taking it twice a day She reports excellent compliance with treatment. She is not having  side effects.  Current symptoms include polydipsia and have been unchanged. Home blood sugar records: 159-198 fasting  Episodes of hypoglycemia? no   Current insulin regiment: Is not on insulin Most Recent Eye Exam: 07/12/2019 Weight trend: fluctuating a bit Prior visit with dietician: No Current exercise: none Current diet habits: in general, a "healthy" diet    Pertinent Labs:    Component Value Date/Time   CHOL 206 (H) 07/19/2019 1224   TRIG 155 (H) 07/19/2019 1224   HDL 51 07/19/2019 1224   LDLCALC  127 (H) 07/19/2019 1224   CREATININE 0.90 10/04/2019 1539    Wt Readings from Last 3 Encounters:  10/13/19 (!) 302 lb (137 kg)  10/06/19 299 lb (135.6 kg)  09/20/19 (!) 301 lb (136.5 kg)    She did follow up with Dr. Delana Meyer, and he feels her kidney is functioning after CT scan. 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   IMPRESSION: 1. No evidence of renal artery stenosis. 2. 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 3. No CT evidence of FMD. 4. Bilateral presumably physiologic adnexal cysts. ------------------------------------------------------------------------ Patient  denies any fever, body aches,chills, rash, chest pain, shortness of breath, nausea, vomiting, or diarrhea.   Allergies  Allergen Reactions  . Toradol [Ketorolac Tromethamine] Hives  . Codeine Rash  . Other Rash    Cilantro and Cedar trees  . Pepto-Bismol [Bismuth] Rash  . Tramadol Rash     Current Outpatient Medications:  .  albuterol (PROVENTIL HFA;VENTOLIN HFA) 108 (90 Base) MCG/ACT inhaler, Inhale 2 puffs into the lungs every 6 (six) hours as needed for wheezing or shortness of breath., Disp: 1 Inhaler, Rfl: 0 .  amLODipine (NORVASC) 10 MG tablet, Take 1 tablet (10 mg total) by mouth daily., Disp: 90 tablet, Rfl: 0 .  busPIRone (BUSPAR) 5 MG tablet, Take 5 mg by mouth 2 (two) times daily., Disp: , Rfl:  .  butalbital-acetaminophen-caffeine (FIORICET) 50-325-40 MG tablet, Take 1 tablet by mouth every 6 (six) hours as needed for headache., Disp: 10 tablet, Rfl: 4 .  levothyroxine (SYNTHROID) 50 MCG tablet, Take 1 tablet (50 mcg total) by mouth daily., Disp: 90 tablet, Rfl: 0 .  metFORMIN (GLUCOPHAGE XR) 500 MG 24 hr tablet, Take two 500 mg tablets (total of 1000mg  ) by mouth with breakfast once daily., Disp: 180 tablet, Rfl: 0 .  metoprolol succinate (TOPROL XL) 25 MG 24 hr tablet, Take 1  tablet (25 mg total) by mouth daily. Check heart rate prior and do not take if heart rate is less than 60 beats per minute., Disp: 60 tablet, Rfl: 0 .  norethindrone (MICRONOR) 0.35 MG tablet, Take 1 tablet (0.35 mg total) by mouth daily., Disp: 1 Package, Rfl: 11 .  traZODone (DESYREL) 100 MG tablet, Take 100 mg by mouth at bedtime as needed for sleep., Disp: , Rfl:  .  venlafaxine XR (EFFEXOR-XR) 75 MG 24 hr capsule, Take 75 mg by mouth at bedtime., Disp: , Rfl:  .  cephALEXin (KEFLEX) 500 MG capsule, Take 1 capsule (500 mg total) by mouth 3 (three) times daily., Disp: 30 capsule, Rfl: 0 .  erythromycin ophthalmic ointment, Place 1 application into both eyes 4 (four) times daily. ( 0.5 % erythromycin ointment).Apply to affected eye four times daily, Disp: 3.5 g, Rfl: 0 .  ondansetron (ZOFRAN-ODT) 4 MG disintegrating tablet, Take 1-2 tablets (4-8 mg total) by mouth every 8 (eight) hours as needed  for nausea. (Patient not taking: Reported on 10/13/2019), Disp: 30 tablet, Rfl: 11  Review of Systems  Constitutional: Negative.  Negative for fever.  HENT: Negative.   Eyes: Positive for blurred vision, redness and itching. Negative for double vision, photophobia, pain (left eye lid upper pain only ), discharge and visual disturbance.  Respiratory: Negative.   Cardiovascular: Negative.   Gastrointestinal: Negative.  Negative for nausea and vomiting.  Endocrine: Negative.   Genitourinary: Negative.   Musculoskeletal: Negative.   Skin: Positive for color change (mild eye lid has erythema ).  Neurological: Negative.   Hematological: Negative.   Psychiatric/Behavioral: Negative for agitation, behavioral problems, confusion, decreased concentration, dysphoric mood, hallucinations, self-injury, sleep disturbance and suicidal ideas. The patient is nervous/anxious. The patient is not hyperactive.     Social History   Tobacco Use  . Smoking status: Never Smoker  . Smokeless tobacco: Never Used    Substance Use Topics  . Alcohol use: Yes    Comment: rarely, maybe 1 shot every 2-3 months      Objective:   BP 130/82   Pulse (!) 103   Temp 98.2 F (36.8 C) (Oral)   Resp 16   Wt (!) 302 lb (137 kg)   SpO2 96%   BMI 49.49 kg/m  Vitals:   10/13/19 1419  BP: 130/82  Pulse: (!) 103  Resp: 16  Temp: 98.2 F (36.8 C)  TempSrc: Oral  SpO2: 96%  Weight: (!) 302 lb (137 kg)  Body mass index is 49.49 kg/m.   Physical Exam Vitals reviewed.  Constitutional:      General: She is not in acute distress.    Appearance: Normal appearance. She is not ill-appearing, toxic-appearing or diaphoretic.  HENT:     Head: Normocephalic and atraumatic.     Jaw: There is normal jaw occlusion.     Right Ear: Tympanic membrane, ear canal and external ear normal. There is no impacted cerumen.     Left Ear: Tympanic membrane, ear canal and external ear normal. There is no impacted cerumen.     Nose: Nose normal. No congestion or rhinorrhea.     Mouth/Throat:     Mouth: Mucous membranes are moist.     Pharynx: No oropharyngeal exudate or posterior oropharyngeal erythema.  Eyes:     General: Lids are everted, no foreign bodies appreciated. Vision grossly intact. Gaze aligned appropriately. No allergic shiner, visual field deficit (no change reported does not have contacts in. last eye exam in october 2020.) or scleral icterus.       Right eye: No foreign body, discharge or hordeolum.        Left eye: Hordeolum (internal hordeleum pea sized, with scant yellow drainage at center of area visalized with eye lid everted ) present.No foreign body or discharge.     Extraocular Movements: Extraocular movements intact.     Conjunctiva/sclera: Conjunctivae normal.     Pupils: Pupils are equal, round, and reactive to light. Pupils are equal.     Right eye: Pupil is round, reactive and not sluggish. No corneal abrasion.     Left eye: Pupil is round, reactive and not sluggish. No corneal abrasion.       Comments: Mild erythema of left upper eye lid over area of hordeolum and to corner of eye lateral side. No warmth. Tenderness only over eyelid at area of concern.   Cardiovascular:     Rate and Rhythm: Normal rate and regular rhythm.     Pulses: Normal pulses.  Heart sounds: Normal heart sounds. No murmur. No friction rub. No gallop.   Pulmonary:     Effort: Pulmonary effort is normal. No respiratory distress.     Breath sounds: Normal breath sounds. No stridor. No wheezing, rhonchi or rales.  Chest:     Chest wall: No tenderness.  Abdominal:     General: There is no distension.     Palpations: Abdomen is soft. There is no mass.     Tenderness: There is no abdominal tenderness. There is no right CVA tenderness, left CVA tenderness, guarding or rebound.     Hernia: No hernia is present.  Musculoskeletal:        General: Normal range of motion.     Cervical back: Normal range of motion and neck supple.  Skin:    General: Skin is warm.     Capillary Refill: Capillary refill takes less than 2 seconds.  Neurological:     General: No focal deficit present.     Mental Status: She is alert and oriented to person, place, and time. Mental status is at baseline.     Coordination: Coordination normal.     Gait: Gait normal.  Psychiatric:        Mood and Affect: Mood normal.        Behavior: Behavior normal.        Thought Content: Thought content normal.        Judgment: Judgment normal.      Results for orders placed or performed in visit on 10/13/19  POCT glycosylated hemoglobin (Hb A1C)  Result Value Ref Range   Hemoglobin A1C 9.1 (A) 4.0 - 5.6 %   HbA1c POC (<> result, manual entry)     HbA1c, POC (prediabetic range)     HbA1c, POC (controlled diabetic range)         Assessment & Plan       1. Hordeolum externum of left upper eyelid  Meds ordered this encounter  Medications  . cephALEXin (KEFLEX) 500 MG capsule    Sig: Take 1 capsule (500 mg total) by mouth 3 (three)  times daily.    Dispense:  30 capsule    Refill:  0  . erythromycin ophthalmic ointment    Sig: Place 1 application into both eyes 4 (four) times daily. ( 0.5 % erythromycin ointment).Apply to affected eye four times daily    Dispense:  3.5 g    Refill:  0  sent to [facility] she does not have insurance- [facility] makes the above affordable she is aware.   Discussed RED Flags for eye pain  and to seek her opthalmologist for care. No symptoms should worsen if they do she should be seen immediately.   An After Visit Summary was printed and given to the patient.  2. Type 2 diabetes mellitus without complication, without long-term current use of insulin (HCC) Results for orders placed or performed in visit on 10/13/19 (from the past 24 hour(s))  POCT glycosylated hemoglobin (Hb A1C)     Status: Abnormal   Collection Time: 10/13/19  3:11 PM  Result Value Ref Range   Hemoglobin A1C 9.1 (A) 4.0 - 5.6 %   HbA1c POC (<> result, manual entry)     HbA1c, POC (prediabetic range)     HbA1c, POC (controlled diabetic range)     - POCT glycosylated hemoglobin (Hb A1C)  Improved and she just started XR Metformin 1,000 mg daily total on this past Monday 10/10/2019.    3.  Morbid obesity (HCC) Weight loss and exercise. Dietary changes discussed.   4. Essential hypertension Blood pressure improving with current treatment. Will continue    Advised patient call the office or your primary care doctor for an appointment if no improvement within 72 hours or if any symptoms change or worsen at any time  Advised ER or urgent Care if after hours or on weekend. Call 911 for emergency symptoms at any time.Patinet verbalized understanding of all instructions given/reviewed and treatment plan and has no further questions or concerns at this time.     Return in about 1 month (around 11/10/2019), or if symptoms worsen or fail to improve, for Go to Emergency room/ urgent care if worse, at any time for any worsening  symptoms.    Addressed extensive list of chronic and acute medical problems today requiring 35 minutes reviewing her medical record, counseling patient regarding her conditions and coordination of care.    The entirety of the information documented in the History of Present Illness, Review of Systems and Physical Exam were personally obtained by me. Portions of this information were initially documented by the  Certified Medical Assistant whose name is documented in Epic and reviewed by me for thoroughness and accuracy.  I have personally performed the exam and reviewed the chart and it is accurate to the best of my knowledge.  Museum/gallery conservator has been used and any errors in dictation or transcription are unintentional.  Eula Fried. Flinchum FNP-C  [facility]  Jairo Ben, FNP  [facility]

## 2019-10-17 ENCOUNTER — Ambulatory Visit: Payer: Self-pay | Admitting: *Deleted

## 2019-10-17 DIAGNOSIS — F331 Major depressive disorder, recurrent, moderate: Secondary | ICD-10-CM

## 2019-10-17 DIAGNOSIS — N261 Atrophy of kidney (terminal): Secondary | ICD-10-CM

## 2019-10-17 NOTE — Patient Instructions (Signed)
Thank you allowing the Chronic Care Management Team to be a part of your care! It was a pleasure speaking with you today!  1. Please complete the Medicaid application  2. Please call this social worker with any questions or concerns regarding your community resource needs.  CCM (Chronic Care Management) Team   [name]  RN, BSN Nurse Care Coordinator  [phone]  Karalee Height PharmD  Clinical Pharmacist  [phone]   Verna Czech, LCSW Clinical Social Worker [phone]  Goals Addressed            This Visit's Progress   . "I need a CT scan of my kidneys" (pt-stated)       Current Barriers:  . Financial constraints related to lack of insurance  to cover needed medical follow up  Clinical Social Work Clinical Goal(s):  [name] Over the next 90 days, patient will work on completing a medicaid application to cover needed medical follow up  Interventions: . Provided patient with information about applying for [facility] through [facility] to assist with needed medical follow up . Patient confirmed that she has received the medicaid application and has started to complete it but continues to need help from her husband to complete the rest of it . Confirmed with patient that once completed, this social worker will inform her of where to send it back to . Patient continues to confirm that patient with now be going to counseling [employer]) weekly (1 week virtual , 1 week in person) . Provided patient with encouragement to get Medicaid application completed and will follow up on status within 7-10 business days . Discussed plans with patient for ongoing care management follow up and provided patient with direct contact information for care management team   Patient Self Care Activities:  . Patient verbalizes understanding of plan to completed Medicaid application once received . Unable to perform IADLs independently . Knowledge deficit related to community  resources to meet her medical needs  Please see past updates related to this goal by clicking on the "Past Updates" button in the selected goal          The patient verbalized understanding of instructions provided today and declined a print copy of patient instruction materials.   The patient will complete the medicaid application  as advised to begin process for applying for [facility].  This social worker to follow up with patient within 7-10 business days

## 2019-10-17 NOTE — Chronic Care Management (AMB) (Signed)
Care Management    Clinical Social Work Follow Up Note  10/17/2019 Name: Leah Bright MRN: [id number] DOB: 01-17-1991  Leah Bright is a 29 y.o. year old female who is a primary care patient of Flinchum, Leah Fried, FNP. The CCM team was consulted for assistance with Financial Difficulties related to being underinsured.   Review of patient status, including review of consultants reports, other relevant assessments, and collaboration with appropriate care team members and the patient's provider was performed as part of comprehensive patient evaluation and provision of chronic care management services.    Advanced Directives Status: <no information> See Care Plan for related entries.   Outpatient Encounter Medications as of 10/17/2019  Medication Sig  . albuterol (PROVENTIL HFA;VENTOLIN HFA) 108 (90 Base) MCG/ACT inhaler Inhale 2 puffs into the lungs every 6 (six) hours as needed for wheezing or shortness of breath.  [name] amLODipine (NORVASC) 10 MG tablet Take 1 tablet (10 mg total) by mouth daily.  . busPIRone (BUSPAR) 5 MG tablet Take 5 mg by mouth 2 (two) times daily.  . butalbital-acetaminophen-caffeine (FIORICET) 50-325-40 MG tablet Take 1 tablet by mouth every 6 (six) hours as needed for headache.  . cephALEXin (KEFLEX) 500 MG capsule Take 1 capsule (500 mg total) by mouth 3 (three) times daily.  [name] erythromycin ophthalmic ointment Place 1 application into both eyes 4 (four) times daily. ( 0.5 % erythromycin ointment).Apply to affected eye four times daily  . levothyroxine (SYNTHROID) 50 MCG tablet Take 1 tablet (50 mcg total) by mouth daily.  . metFORMIN (GLUCOPHAGE XR) 500 MG 24 hr tablet Take two 500 mg tablets (total of 1000mg  ) by mouth with breakfast once daily.  . metoprolol succinate (TOPROL XL) 25 MG 24 hr tablet Take 1 tablet (25 mg total) by mouth daily. Check heart rate prior and do not take if heart rate is less than 60 beats per minute.  . norethindrone (MICRONOR) 0.35 MG  tablet Take 1 tablet (0.35 mg total) by mouth daily.  . ondansetron (ZOFRAN-ODT) 4 MG disintegrating tablet Take 1-2 tablets (4-8 mg total) by mouth every 8 (eight) hours as needed for nausea. (Patient not taking: Reported on 10/13/2019)  . traZODone (DESYREL) 100 MG tablet Take 100 mg by mouth at bedtime as needed for sleep.  12/11/2019 venlafaxine XR (EFFEXOR-XR) 75 MG 24 hr capsule Take 75 mg by mouth at bedtime.   No facility-administered encounter medications on file as of 10/17/2019.     Goals Addressed            This Visit's Progress   . "I need a CT scan of my kidneys" (pt-stated)       Current Barriers:  . Financial constraints related to lack of insurance  to cover needed medical follow up  Clinical Social Work Clinical Goal(s):  10/19/2019 Over the next 90 days, patient will work on completing a medicaid application to cover needed medical follow up  Interventions: . Provided patient with information about applying for [facility] through [facility] to assist with needed medical follow up . Patient confirmed that she has received the medicaid application and has started to complete it but continues to need help from her husband to complete the rest of it . Confirmed with patient that once completed, this social worker will inform her of where to send it back to . Patient continues to confirm that patient with now be going to counseling [facility]) weekly (1 week virtual , 1 week in person) .  Provided patient with encouragement to get Medicaid application completed and will follow up on status within 7-10 business days . Discussed plans with patient for ongoing care management follow up and provided patient with direct contact information for care management team   Patient Self Care Activities:  . Patient verbalizes understanding of plan to completed Medicaid application once received . Unable to perform IADLs independently . Knowledge deficit related to community resources to meet her  medical needs  Please see past updates related to this goal by clicking on the "Past Updates" button in the selected goal          Follow Up Plan: SW will follow up with patient by phone over the next 7-10 business days    [place], Bear Worker  [facility] Care Management [phone]

## 2019-10-26 ENCOUNTER — Other Ambulatory Visit: Payer: Self-pay

## 2019-10-26 ENCOUNTER — Encounter: Payer: Self-pay | Admitting: Obstetrics and Gynecology

## 2019-10-26 ENCOUNTER — Ambulatory Visit: Payer: Self-pay | Admitting: Obstetrics and Gynecology

## 2019-10-26 VITALS — BP 122/80 | Ht 65.0 in | Wt 302.0 lb

## 2019-10-26 DIAGNOSIS — E669 Obesity, unspecified: Secondary | ICD-10-CM

## 2019-10-26 DIAGNOSIS — Z6841 Body Mass Index (BMI) 40.0 and over, adult: Secondary | ICD-10-CM

## 2019-10-26 DIAGNOSIS — N939 Abnormal uterine and vaginal bleeding, unspecified: Secondary | ICD-10-CM

## 2019-10-26 DIAGNOSIS — N926 Irregular menstruation, unspecified: Secondary | ICD-10-CM

## 2019-10-26 DIAGNOSIS — N938 Other specified abnormal uterine and vaginal bleeding: Secondary | ICD-10-CM

## 2019-10-26 DIAGNOSIS — N925 Other specified irregular menstruation: Secondary | ICD-10-CM

## 2019-10-26 MED ORDER — MEDROXYPROGESTERONE ACETATE 10 MG PO TABS: 10.0000 mg | ORAL_TABLET | Freq: Every day | ORAL | 0 refills | Status: DC

## 2019-10-26 NOTE — Patient Instructions (Signed)

## 2019-10-27 ENCOUNTER — Ambulatory Visit: Payer: Self-pay | Admitting: Obstetrics and Gynecology

## 2019-10-28 ENCOUNTER — Telehealth: Payer: Self-pay

## 2019-10-28 ENCOUNTER — Ambulatory Visit: Payer: Self-pay | Admitting: *Deleted

## 2019-10-28 NOTE — Chronic Care Management (AMB) (Signed)
   Care Management   Unsuccessful Call Note 10/28/2019 Name: Leah Bright MRN: [id number] DOB: 1991-07-04   Patient is a 29 year old female who sees Marvell Fuller, FNP for primary care. Marvell Fuller, FNP asked the CCM team to consult the patient for community resources to obtain needed medical care.      This social worker was unable to reach patient via telephone today to follow up on status of her medicaid application . I have left HIPAA compliant voicemail asking patient to return my call. (unsuccessful outreach #1).   Plan: Will follow-up within 7 business days via telephone.      Verna Czech, LCSW Clinical Social Worker  [facility] Care Management [phone]

## 2019-10-31 ENCOUNTER — Encounter: Payer: Self-pay | Admitting: Adult Health

## 2019-10-31 NOTE — Progress Notes (Signed)
Patient ID: Leah Bright, female   DOB: July 20, 1991, 29 y.o.   MRN: [id number]  Reason for Consult: Follow-up (Still having irregular bleeding, having a period about every other week )   Referred by Doreen Beam, F*  Subjective:     HPI:  Leah Bright is a 29 y.o. female she reports that she has continued to have irregular menstrual bleeding despite taking Camilla.  She will have a period every other week.  The bleeding is not generally heavy but bothersome.  She has continued to seek care for her other chronic medical issues which have been improving.  She would like to get pregnant and conceive in the future and is motivated to do this.  Past Medical History:  Diagnosis Date  . Agitation   . Allergy   . Anxiety   . Asthma   . Chronic kidney disease   . Depression   . Diabetes mellitus without complication ([place])   . High cholesterol   . Hypertension   . Hypothyroidism   . Left renal artery stenosis ([place]) 09/07/2019  . Migraine   . OCD (obsessive compulsive disorder)   . PTSD (post-traumatic stress disorder)   . Renal artery stenosis ([place])   . Right renal atrophy   . Social anxiety disorder   . Social anxiety disorder   . Social phobia   . Thyroid disease    Family History  Problem Relation Age of Onset  . Cancer Mother        brain tumor  . Hypertension Mother   . Depression Mother   . Diabetes Mother   . Migraines Mother        benign tumor, still gets headaches sometimes now   . Anxiety disorder Father   . Depression Father   . Hypertension Maternal Grandmother   . Thyroid disease Maternal Grandmother   . Diabetes Maternal Grandmother   . Hypertension Maternal Grandfather   . Cancer Maternal Grandfather        myositis  . High blood pressure Maternal Grandfather   . Diabetes Maternal Grandfather   . Stroke Maternal Grandfather    Past Surgical History:  Procedure Laterality Date  . CHOLECYSTECTOMY  2008  . endoscopy/colonoscopy   2011  .  LAPAROSCOPIC GASTRIC BANDING  2007  . LAPAROSCOPIC REPAIR AND REMOVAL OF GASTRIC BAND  2011  . TONSILLECTOMY  1994  . WISDOM TOOTH EXTRACTION      Short Social History:  Social History   Tobacco Use  . Smoking status: Never Smoker  . Smokeless tobacco: Never Used  Substance Use Topics  . Alcohol use: Yes    Comment: rarely, maybe 1 shot every 2-3 months    Allergies  Allergen Reactions  . Toradol [Ketorolac Tromethamine] Hives  . Codeine Rash  . Other Rash    Cilantro and Cedar trees  . Pepto-Bismol [Bismuth] Rash  . Tramadol Rash    Current Outpatient Medications  Medication Sig Dispense Refill  . albuterol (PROVENTIL HFA;VENTOLIN HFA) 108 (90 Base) MCG/ACT inhaler Inhale 2 puffs into the lungs every 6 (six) hours as needed for wheezing or shortness of breath. 1 Inhaler 0  . amLODipine (NORVASC) 10 MG tablet Take 1 tablet (10 mg total) by mouth daily. 90 tablet 0  . busPIRone (BUSPAR) 5 MG tablet Take 5 mg by mouth 2 (two) times daily.    . butalbital-acetaminophen-caffeine (FIORICET) 50-325-40 MG tablet Take 1 tablet by mouth every 6 (six) hours as needed for headache. 10  tablet 4  . levothyroxine (SYNTHROID) 50 MCG tablet Take 1 tablet (50 mcg total) by mouth daily. 90 tablet 0  . metFORMIN (GLUCOPHAGE XR) 500 MG 24 hr tablet Take two 500 mg tablets (total of 1000mg  ) by mouth with breakfast once daily. 180 tablet 0  . metoprolol succinate (TOPROL XL) 25 MG 24 hr tablet Take 1 tablet (25 mg total) by mouth daily. Check heart rate prior and do not take if heart rate is less than 60 beats per minute. 60 tablet 0  . norethindrone (MICRONOR) 0.35 MG tablet Take 1 tablet (0.35 mg total) by mouth daily. 1 Package 11  . ondansetron (ZOFRAN-ODT) 4 MG disintegrating tablet Take 1-2 tablets (4-8 mg total) by mouth every 8 (eight) hours as needed for nausea. 30 tablet 11  . propranolol (INDERAL) 10 MG tablet Take 10 mg by mouth 3 (three) times daily.    . traZODone (DESYREL) 100 MG  tablet Take 100 mg by mouth at bedtime as needed for sleep.    venlafaxine XR (EFFEXOR-XR) 75 MG 24 hr capsule Take 75 mg by mouth at bedtime.    . medroxyPROGESTERone (PROVERA) 10 MG tablet Take 1 tablet (10 mg total) by mouth daily. Take 3 times a day for 7 days, then take twice a day for 7 days, then take once a week for 7 days. 42 tablet 0   No current facility-administered medications for this visit.    Review of Systems  Constitutional: Negative for chills, fatigue, fever and unexpected weight change.  HENT: Negative for trouble swallowing.  Eyes: Negative for loss of vision.  Respiratory: Negative for cough, shortness of breath and wheezing.  Cardiovascular: Negative for chest pain, leg swelling, palpitations and syncope.  GI: Negative for abdominal pain, blood in stool, diarrhea, nausea and vomiting.  GU: Negative for difficulty urinating, dysuria, frequency and hematuria.  Musculoskeletal: Negative for back pain, leg pain and joint pain.  Skin: Negative for rash.  Neurological: Negative for dizziness, headaches, light-headedness, numbness and seizures.  Psychiatric: Negative for behavioral problem, confusion, depressed mood and sleep disturbance.        Objective:  Objective   Vitals:   10/26/19 1550  BP: 122/80  Weight: (!) 302 lb (137 kg)  Height: 5\' 5"  (1.651 m)   Body mass index is 50.26 kg/m.  Physical Exam Vitals and nursing note reviewed.  Constitutional:      Appearance: She is well-developed.  HENT:     Head: Normocephalic and atraumatic.  Eyes:     Pupils: Pupils are equal, round, and reactive to light.  Cardiovascular:     Rate and Rhythm: Normal rate and regular rhythm.  Pulmonary:     Effort: Pulmonary effort is normal. No respiratory distress.  Skin:    General: Skin is warm and dry.  Neurological:     Mental Status: She is alert and oriented to person, place, and time.  Psychiatric:        Behavior: Behavior normal.        Thought Content:  Thought content normal.        Judgment: Judgment normal.         Assessment/Plan:     29 year old with continued abnormal uterine bleeding despite Camilla usage.  We will have patient return for pelvic ultrasound in 2 to 4 weeks. Will start medroxyprogesterone and taper over the next 3 weeks.  After taking medroxyprogesterone will restart Camilla.  Options for managing patient's bleeding with hormones are limited because of  her multiple medical commorbidities.  Blood pressure is improved today.  Weight is roughly the same.  Diabetes control has been improving.  Discussed options for Nexplanon and IUD patient is hesitant to receive either of these.   More than 15 minutes were spent face to face with the patient in the room with more than 50% of the time spent providing counseling and discussing the plan of management.  Adelene Idler MD [facility], [facility] 10/31/2019 7:25 AM

## 2019-11-04 ENCOUNTER — Ambulatory Visit: Payer: Self-pay | Admitting: *Deleted

## 2019-11-04 DIAGNOSIS — F331 Major depressive disorder, recurrent, moderate: Secondary | ICD-10-CM

## 2019-11-04 NOTE — Chronic Care Management (AMB) (Signed)
Care Management    Clinical Social Work Follow Up Note  11/04/2019 Name: Leah Bright MRN: [id number] DOB: [birth year]  Leah Bright is a 29 y.o. year old female who is a primary care patient of Flinchum, Eula Fried, FNP. The CCM team was consulted for assistance with [facility] .   Review of patient status, including review of consultants reports, other relevant assessments, and collaboration with appropriate care team members and the patient's provider was performed as part of comprehensive patient evaluation and provision of chronic care management services.    SDOH (Social Determinants of Health) assessments performed: Yes    Outpatient Encounter Medications as of 11/04/2019  Medication Sig  . albuterol (PROVENTIL HFA;VENTOLIN HFA) 108 (90 Base) MCG/ACT inhaler Inhale 2 puffs into the lungs every 6 (six) hours as needed for wheezing or shortness of breath.  [name] amLODipine (NORVASC) 10 MG tablet Take 1 tablet (10 mg total) by mouth daily.  . busPIRone (BUSPAR) 5 MG tablet Take 5 mg by mouth 2 (two) times daily.  . butalbital-acetaminophen-caffeine (FIORICET) 50-325-40 MG tablet Take 1 tablet by mouth every 6 (six) hours as needed for headache.  . levothyroxine (SYNTHROID) 50 MCG tablet Take 1 tablet (50 mcg total) by mouth daily.  . medroxyPROGESTERone (PROVERA) 10 MG tablet Take 1 tablet (10 mg total) by mouth daily. Take 3 times a day for 7 days, then take twice a day for 7 days, then take once a week for 7 days.  . metFORMIN (GLUCOPHAGE XR) 500 MG 24 hr tablet Take two 500 mg tablets (total of 1000mg  ) by mouth with breakfast once daily.  . metoprolol succinate (TOPROL XL) 25 MG 24 hr tablet Take 1 tablet (25 mg total) by mouth daily. Check heart rate prior and do not take if heart rate is less than 60 beats per minute.  . norethindrone (MICRONOR) 0.35 MG tablet Take 1 tablet (0.35 mg total) by mouth daily.  . ondansetron (ZOFRAN-ODT) 4 MG disintegrating tablet Take 1-2 tablets  (4-8 mg total) by mouth every 8 (eight) hours as needed for nausea.  . propranolol (INDERAL) 10 MG tablet Take 10 mg by mouth 3 (three) times daily.  . traZODone (DESYREL) 100 MG tablet Take 100 mg by mouth at bedtime as needed for sleep.  venlafaxine XR (EFFEXOR-XR) 75 MG 24 hr capsule Take 75 mg by mouth at bedtime.   No facility-administered encounter medications on file as of 11/04/2019.     Goals Addressed            This Visit's Progress   . "I need a CT scan of my kidneys" (pt-stated)       Current Barriers:  . Financial constraints related to lack of insurance  to cover needed medical follow up  Clinical Social Work Clinical Goal(s):  01/04/2020 Over the next 90 days, patient will work on completing a medicaid application to cover needed medical follow up  Interventions: . Followed up with patient regarding medicaid application completion and submission. . Confirmed with patient that due to illness, she has not submitted the application  . Confirmed that she has been in contact with her primary care provider regarding her illness . Provided patient with encouragement to get Medicaid application completed and will follow up on status within  14 business days . Discussed plans with patient for ongoing care management follow up and provided patient with direct contact information for care management team   Patient Self Care Activities:  . Patient  verbalizes understanding of plan to completed Medicaid application once received . Unable to perform IADLs independently . Knowledge deficit related to community resources to meet her medical needs  Please see past updates related to this goal by clicking on the "Past Updates" button in the selected goal          Follow Up Plan: SW will follow up with patient by phone over the next 14 business days    [place], [place] Clinical Social Worker  [facility] Care Management [phone]

## 2019-11-04 NOTE — Patient Instructions (Signed)
Thank you allowing the Chronic Care Management Team to be a part of your care! It was a pleasure speaking with you today!  1. Please submit the Medicaid application as soon as you are able 2. Please call this social worker with any questions or concerns regarding the submission of your medicaid application  CCM (Chronic Care Management) Team   [name] RN, BSN Nurse Care Coordinator  [phone]  Karalee Height PharmD  Clinical Pharmacist  [phone]   Sanda Dejoy [address] LCSW Clinical Social Worker [phone]  Goals Addressed            This Visit's Progress   . "I need a CT scan of my kidneys" (pt-stated)       Current Barriers:  . Financial constraints related to lack of insurance  to cover needed medical follow up  Clinical Social Work Clinical Goal(s):  [name] Over the next 90 days, patient will work on completing a medicaid application to cover needed medical follow up  Interventions: . Followed up with patient regarding medicaid application completion and submission. . Confirmed with patient that due to illness, she has not submitted the application  . Confirmed that she has been in contact with her primary care provider regarding her illness . Provided patient with encouragement to get Medicaid application completed and will follow up on status within  14 business days . Discussed plans with patient for ongoing care management follow up and provided patient with direct contact information for care management team   Patient Self Care Activities:  . Patient verbalizes understanding of plan to completed Medicaid application once received . Unable to perform IADLs independently . Knowledge deficit related to community resources to meet her medical needs  Please see past updates related to this goal by clicking on the "Past Updates" button in the selected goal          The patient verbalized understanding of instructions provided today and declined a print  copy of patient instruction materials.   Telephone follow up appointment with care management team member scheduled for: 11/18/19

## 2019-11-15 ENCOUNTER — Other Ambulatory Visit: Payer: Self-pay | Admitting: Adult Health

## 2019-11-16 MED ORDER — METOPROLOL SUCCINATE ER 25 MG PO TB24: 25.0000 mg | ORAL_TABLET | Freq: Every day | ORAL | 0 refills | Status: DC

## 2019-11-18 ENCOUNTER — Other Ambulatory Visit: Payer: Self-pay

## 2019-11-18 ENCOUNTER — Ambulatory Visit: Payer: Self-pay | Admitting: *Deleted

## 2019-11-18 ENCOUNTER — Ambulatory Visit: Payer: Self-pay | Admitting: Obstetrics and Gynecology

## 2019-11-18 ENCOUNTER — Telehealth: Payer: Self-pay | Admitting: *Deleted

## 2019-11-18 NOTE — Chronic Care Management (AMB) (Signed)
    Care Management   Unsuccessful Call Note 11/18/2019 Name: Leah Bright MRN: [id number] DOB: 1990/10/22  Patient is a 29 year old female who sees Marvell Fuller, FNP for primary care. Marvell Fuller, FNP asked the CCM team to consult the patient for [facility].       This social worker was unable to reach patient via telephone today for follow up call. I have left HIPAA compliant voicemail asking patient to return my call. (unsuccessful outreach #1).   Plan: Will follow-up within 7 business days via telephone.      Verna Czech, LCSW Clinical Social Worker  [facility] Care Management [phone]

## 2019-11-23 ENCOUNTER — Ambulatory Visit: Payer: Self-pay | Admitting: *Deleted

## 2019-11-23 DIAGNOSIS — F331 Major depressive disorder, recurrent, moderate: Secondary | ICD-10-CM

## 2019-11-23 DIAGNOSIS — N261 Atrophy of kidney (terminal): Secondary | ICD-10-CM

## 2019-11-23 NOTE — Chronic Care Management (AMB) (Signed)
Care Management    Clinical Social Work Follow Up Note  11/23/2019 Name: Leah Bright MRN: [id number] DOB: November 03, 1990  Leah Bright is a 29 y.o. year old female who is a primary care patient of Flinchum, Kelby Aline, FNP. The CCM team was consulted for assistance with [employer] .   Review of patient status, including review of consultants reports, other relevant assessments, and collaboration with appropriate care team members and the patient's provider was performed as part of comprehensive patient evaluation and provision of chronic care management services.    SDOH (Social Determinants of Health) assessments performed: No    Outpatient Encounter Medications as of 11/23/2019  Medication Sig  . albuterol (PROVENTIL HFA;VENTOLIN HFA) 108 (90 Base) MCG/ACT inhaler Inhale 2 puffs into the lungs every 6 (six) hours as needed for wheezing or shortness of breath.  [name] amLODipine (NORVASC) 10 MG tablet Take 1 tablet (10 mg total) by mouth daily.  . busPIRone (BUSPAR) 5 MG tablet Take 5 mg by mouth 2 (two) times daily.  . butalbital-acetaminophen-caffeine (FIORICET) 50-325-40 MG tablet Take 1 tablet by mouth every 6 (six) hours as needed for headache.  . levothyroxine (SYNTHROID) 50 MCG tablet Take 1 tablet (50 mcg total) by mouth daily.  . medroxyPROGESTERone (PROVERA) 10 MG tablet Take 1 tablet (10 mg total) by mouth daily. Take 3 times a day for 7 days, then take twice a day for 7 days, then take once a week for 7 days.  . metFORMIN (GLUCOPHAGE XR) 500 MG 24 hr tablet Take two 500 mg tablets (total of 1000mg  ) by mouth with breakfast once daily.  . metoprolol succinate (TOPROL XL) 25 MG 24 hr tablet Take 1 tablet (25 mg total) by mouth daily. Check heart rate prior and do not take if heart rate is less than 60 beats per minute.  . norethindrone (MICRONOR) 0.35 MG tablet Take 1 tablet (0.35 mg total) by mouth daily.  . ondansetron (ZOFRAN-ODT) 4 MG disintegrating tablet Take 1-2  tablets (4-8 mg total) by mouth every 8 (eight) hours as needed for nausea.  . propranolol (INDERAL) 10 MG tablet Take 10 mg by mouth 3 (three) times daily.  . traZODone (DESYREL) 100 MG tablet Take 100 mg by mouth at bedtime as needed for sleep.  [name] venlafaxine XR (EFFEXOR-XR) 75 MG 24 hr capsule Take 75 mg by mouth at bedtime.   No facility-administered encounter medications on file as of 11/23/2019.     Goals Addressed            This Visit's Progress   . "I need a CT scan of my kidneys" (pt-stated)       Current Barriers:  . Financial constraints related to lack of insurance  to cover needed medical follow up  Clinical Social Work Clinical Goal(s):  [name] Over the next 90 days, patient will work on completing a medicaid application to cover needed medical follow up  Interventions: . Followed up with patient regarding medicaid application completion and submission. . Confirmed with patient that due to illness and feeling overwhelmed, she has not submitted the application  . Discussed now needing a new mental health therapist since moving to [facility], patient provided with resources from previous therapist to follow up on . Confirmed that she has been in contact with her primary care provider regarding her illness . Provided patient with encouragement to get Medicaid application completed and provided positive reinforcement and benefits in doing so . Confirmed plan for patient  and her spouse to complete the Medicaid application this weekend.  . Discussed plans with patient for ongoing care management follow up and provided patient with direct contact information for care management team   Patient Self Care Activities:  . Patient verbalizes understanding of plan to completed Medicaid application once received . Unable to perform IADLs independently . Knowledge deficit related to community resources to meet her medical needs  Please see past updates related to this goal by  clicking on the "Past Updates" button in the selected goal          Follow Up Plan: SW will follow up with patient by phone over the next 2 weeks    Layni Kreamer, [place] Clinical Social Worker  [facility] Care Management [phone]

## 2019-11-23 NOTE — Patient Instructions (Signed)
Thank you allowing the Chronic Care Management Team to be a part of your care! It was a pleasure speaking with you today!  1. Please complete and submit your medicaid application as soon as possbile  CCM (Chronic Care Management) Team   [name]  RN, BSN Nurse Care Coordinator  [phone]  [name] [address] LCSW Clinical Social Worker [phone]  Goals Addressed            This Visit's Progress   . "I need a CT scan of my kidneys" (pt-stated)       Current Barriers:  . Financial constraints related to lack of insurance  to cover needed medical follow up  Clinical Social Work Clinical Goal(s):  [name] Over the next 90 days, patient will work on completing a medicaid application to cover needed medical follow up  Interventions: . Followed up with patient regarding medicaid application completion and submission. . Confirmed with patient that due to illness and feeling overwhelmed, she has not submitted the application  . Discussed now needing a new mental health therapist since moving to [facility], patient provided with resources from previous therapist to follow up on . Confirmed that she has been in contact with her primary care provider regarding her illness . Provided patient with encouragement to get Medicaid application completed and provided positive reinforcement and benefits in doing so . Confirmed plan for patient and her spouse to complete the Medicaid application this weekend.  . Discussed plans with patient for ongoing care management follow up and provided patient with direct contact information for care management team   Patient Self Care Activities:  . Patient verbalizes understanding of plan to completed Medicaid application once received . Unable to perform IADLs independently . Knowledge deficit related to community resources to meet her medical needs  Please see past updates related to this goal by clicking on the "Past Updates" button in the  selected goal          The patient verbalized understanding of instructions provided today and declined a print copy of patient instruction materials.   Telephone follow up appointment with care management team member scheduled for: 12/07/19

## 2019-12-06 ENCOUNTER — Ambulatory Visit: Payer: Self-pay

## 2019-12-06 ENCOUNTER — Ambulatory Visit: Payer: Self-pay | Admitting: Obstetrics and Gynecology

## 2019-12-07 ENCOUNTER — Ambulatory Visit: Payer: Self-pay | Admitting: *Deleted

## 2019-12-07 NOTE — Chronic Care Management (AMB) (Signed)
   Care Management   Social Work Note  12/07/2019 Name: Leah Bright MRN: [id number] DOB: February 28, 1991  Leah Bright is a 29 y.o. year old female who sees Flinchum, Eula Fried, FNP for primary care. The CCM team was consulted for assistance with Community Resources    Follow up call to patient. Patient was not able to complete call with this social worker as she was attending a funeral at the time of the call. [name]   SDOH (Social Determinants of Health) assessments performed: No     Outpatient Encounter Medications as of 12/07/2019  Medication Sig  . albuterol (PROVENTIL HFA;VENTOLIN HFA) 108 (90 Base) MCG/ACT inhaler Inhale 2 puffs into the lungs every 6 (six) hours as needed for wheezing or shortness of breath.  [name] amLODipine (NORVASC) 10 MG tablet Take 1 tablet (10 mg total) by mouth daily.  . busPIRone (BUSPAR) 5 MG tablet Take 5 mg by mouth 2 (two) times daily.  . butalbital-acetaminophen-caffeine (FIORICET) 50-325-40 MG tablet Take 1 tablet by mouth every 6 (six) hours as needed for headache.  . levothyroxine (SYNTHROID) 50 MCG tablet Take 1 tablet (50 mcg total) by mouth daily.  . medroxyPROGESTERone (PROVERA) 10 MG tablet Take 1 tablet (10 mg total) by mouth daily. Take 3 times a day for 7 days, then take twice a day for 7 days, then take once a week for 7 days.  . metFORMIN (GLUCOPHAGE XR) 500 MG 24 hr tablet Take two 500 mg tablets (total of 1000mg  ) by mouth with breakfast once daily.  . metoprolol succinate (TOPROL XL) 25 MG 24 hr tablet Take 1 tablet (25 mg total) by mouth daily. Check heart rate prior and do not take if heart rate is less than 60 beats per minute.  . norethindrone (MICRONOR) 0.35 MG tablet Take 1 tablet (0.35 mg total) by mouth daily.  . ondansetron (ZOFRAN-ODT) 4 MG disintegrating tablet Take 1-2 tablets (4-8 mg total) by mouth every 8 (eight) hours as needed for nausea.  . propranolol (INDERAL) 10 MG tablet Take 10 mg by mouth 3 (three) times daily.  . traZODone  (DESYREL) 100 MG tablet Take 100 mg by mouth at bedtime as needed for sleep.  venlafaxine XR (EFFEXOR-XR) 75 MG 24 hr capsule Take 75 mg by mouth at bedtime.   No facility-administered encounter medications on file as of 12/07/2019.    Goals Addressed   None     Follow Up Plan: SW will follow up with patient by phone over the next 7-10 business days   Leah Bright, 02/06/2022 Clinical Social Worker  [facility] Care Management [phone]

## 2019-12-08 ENCOUNTER — Other Ambulatory Visit: Payer: Self-pay | Admitting: Adult Health

## 2019-12-08 MED ORDER — AMLODIPINE BESYLATE 10 MG PO TABS: 10.0000 mg | ORAL_TABLET | Freq: Every day | ORAL | 0 refills | Status: DC

## 2019-12-12 ENCOUNTER — Ambulatory Visit: Payer: Self-pay | Admitting: *Deleted

## 2019-12-12 DIAGNOSIS — F331 Major depressive disorder, recurrent, moderate: Secondary | ICD-10-CM

## 2019-12-12 DIAGNOSIS — N261 Atrophy of kidney (terminal): Secondary | ICD-10-CM

## 2019-12-12 NOTE — Chronic Care Management (AMB) (Signed)
Care Management    Clinical Social Work Follow Up Note  12/12/2019 Name: Leah Bright MRN: [id number] DOB: Jan 31, 1991  Leah Bright is a 29 y.o. year old female who is a primary care patient of Flinchum, Kelby Aline, FNP. The CCM team was consulted for assistance with [employer] .   Review of patient status, including review of consultants reports, other relevant assessments, and collaboration with appropriate care team members and the patient's provider was performed as part of comprehensive patient evaluation and provision of chronic care management services.    SDOH (Social Determinants of Health) assessments performed: No    Outpatient Encounter Medications as of 12/12/2019  Medication Sig  . albuterol (PROVENTIL HFA;VENTOLIN HFA) 108 (90 Base) MCG/ACT inhaler Inhale 2 puffs into the lungs every 6 (six) hours as needed for wheezing or shortness of breath.  [name] amLODipine (NORVASC) 10 MG tablet Take 1 tablet (10 mg total) by mouth daily.  . busPIRone (BUSPAR) 5 MG tablet Take 5 mg by mouth 2 (two) times daily.  . butalbital-acetaminophen-caffeine (FIORICET) 50-325-40 MG tablet Take 1 tablet by mouth every 6 (six) hours as needed for headache.  . levothyroxine (SYNTHROID) 50 MCG tablet Take 1 tablet (50 mcg total) by mouth daily.  . medroxyPROGESTERone (PROVERA) 10 MG tablet Take 1 tablet (10 mg total) by mouth daily. Take 3 times a day for 7 days, then take twice a day for 7 days, then take once a week for 7 days.  . metFORMIN (GLUCOPHAGE XR) 500 MG 24 hr tablet Take two 500 mg tablets (total of 1000mg  ) by mouth with breakfast once daily.  . metoprolol succinate (TOPROL XL) 25 MG 24 hr tablet Take 1 tablet (25 mg total) by mouth daily. Check heart rate prior and do not take if heart rate is less than 60 beats per minute.  . norethindrone (MICRONOR) 0.35 MG tablet Take 1 tablet (0.35 mg total) by mouth daily.  . ondansetron (ZOFRAN-ODT) 4 MG disintegrating tablet Take 1-2  tablets (4-8 mg total) by mouth every 8 (eight) hours as needed for nausea.  . propranolol (INDERAL) 10 MG tablet Take 10 mg by mouth 3 (three) times daily.  . traZODone (DESYREL) 100 MG tablet Take 100 mg by mouth at bedtime as needed for sleep.  [name] venlafaxine XR (EFFEXOR-XR) 75 MG 24 hr capsule Take 75 mg by mouth at bedtime.   No facility-administered encounter medications on file as of 12/12/2019.     Goals Addressed            This Visit's Progress   . "I need a CT scan of my kidneys" (pt-stated)       Current Barriers:  . Financial constraints related to lack of insurance  to cover needed medical follow up  Clinical Social Work Clinical Goal(s):  [name] Over the next 90 days, patient will work on completing a medicaid application to cover needed medical follow up  Interventions: . Followed up with patient regarding medicaid application completion and submission. . Confirmed with patient that  she has not submitted the application but it has been completed with the help of her mother in law . Encouraged patient to make a copy of the Medicaid application before she submits it. Address to the Department of Social Services provided for mailing. . Followed up with the status of finding a a new mental health therapist since moving to [facility], patient provided with resources from previous therapist to follow up on . Confirmed that  she has not found a new therapist yet but continues to search for one . This Child psychotherapist provided patient with encouragement to get Medicaid application completed and provided encouragement to find a new mental health therapist . Discussed plans with patient for ongoing care management follow up and provided patient with direct contact information for care management team   Patient Self Care Activities:  . Patient verbalizes understanding of plan to completed Medicaid application once received . Unable to perform IADLs independently . Knowledge deficit  related to community resources to meet her medical needs  Please see past updates related to this goal by clicking on the "Past Updates" button in the selected goal          Follow Up Plan: SW will follow up with patient by phone over the next 2 weeks    Leah Bright, [place] Clinical Social Worker  [facility] Care Management [phone]

## 2019-12-12 NOTE — Patient Instructions (Addendum)
Thank you allowing the Chronic Care Management Team to be a part of your care! It was a pleasure speaking with you today!  1. Please send your completed Medicaid application to the Department of Social Services [address] [place], [place] [address] 2. Please follow up with a mental health counselor of choice as soon as possible  CCM (Chronic Care Management) Team   [name]  RN, BSN Nurse Care Coordinator  [phone]  Asia Dusenbury [address] LCSW Clinical Social Worker [phone]  Goals Addressed            This Visit's Progress   . "I need a CT scan of my kidneys" (pt-stated)       Current Barriers:  . Financial constraints related to lack of insurance  to cover needed medical follow up  Clinical Social Work Clinical Goal(s):  [name] Over the next 90 days, patient will work on completing a medicaid application to cover needed medical follow up  Interventions: . Followed up with patient regarding medicaid application completion and submission. . Confirmed with patient that  she has not submitted the application but it has been completed with the help of her mother in law . Encouraged patient to make a copy of the Medicaid application before she submits it. Address to the Department of Social Services provided for mailing. . Followed up with the status of finding a a new mental health therapist since moving to [facility], patient provided with resources from previous therapist to follow up on . Confirmed that she has not found a new therapist yet but continues to search for one . This Child psychotherapist provided patient with encouragement to get Medicaid application completed and provided encouragement to find a new mental health therapist . Discussed plans with patient for ongoing care management follow up and provided patient with direct contact information for care management team   Patient Self Care Activities:  . Patient verbalizes understanding of plan to completed Medicaid  application once received . Unable to perform IADLs independently . Knowledge deficit related to community resources to meet her medical needs  Please see past updates related to this goal by clicking on the "Past Updates" button in the selected goal          The patient verbalized understanding of instructions provided today and declined a print copy of patient instruction materials.   Telephone follow up appointment with care management team member scheduled for:  12/23/19

## 2019-12-14 ENCOUNTER — Other Ambulatory Visit: Payer: Self-pay | Admitting: Adult Health

## 2019-12-14 MED ORDER — METFORMIN HCL ER 500 MG PO TB24: ORAL_TABLET | ORAL | 0 refills | Status: DC

## 2019-12-19 ENCOUNTER — Other Ambulatory Visit: Payer: Self-pay

## 2019-12-19 ENCOUNTER — Ambulatory Visit (INDEPENDENT_AMBULATORY_CARE_PROVIDER_SITE_OTHER): Payer: Self-pay | Admitting: Obstetrics and Gynecology

## 2019-12-19 ENCOUNTER — Other Ambulatory Visit: Payer: Self-pay | Admitting: Obstetrics and Gynecology

## 2019-12-19 ENCOUNTER — Ambulatory Visit (INDEPENDENT_AMBULATORY_CARE_PROVIDER_SITE_OTHER): Payer: Self-pay

## 2019-12-19 ENCOUNTER — Encounter: Payer: Self-pay | Admitting: Obstetrics and Gynecology

## 2019-12-19 VITALS — BP 132/82 | Ht 65.0 in | Wt 306.0 lb

## 2019-12-19 DIAGNOSIS — N939 Abnormal uterine and vaginal bleeding, unspecified: Secondary | ICD-10-CM

## 2019-12-19 DIAGNOSIS — N926 Irregular menstruation, unspecified: Secondary | ICD-10-CM

## 2019-12-23 ENCOUNTER — Ambulatory Visit: Payer: Self-pay | Admitting: *Deleted

## 2019-12-23 DIAGNOSIS — F331 Major depressive disorder, recurrent, moderate: Secondary | ICD-10-CM

## 2019-12-23 DIAGNOSIS — N261 Atrophy of kidney (terminal): Secondary | ICD-10-CM

## 2019-12-23 NOTE — Patient Instructions (Signed)
Thank you allowing the Chronic Care Management Team to be a part of your care! It was a pleasure speaking with you today!  1. Please follow up with the status of the Medicaid application  2. Please anticipate a call from the [facility] for initial intake  CCM (Chronic Care Management) Team   [name]  RN, BSN Nurse Care Coordinator  [phone]   [place] [address] LCSW Clinical Social Worker [phone]  Goals Addressed            This Visit's Progress   . "I need a CT scan of my kidneys" (pt-stated)       Current Barriers:  . Financial constraints related to lack of insurance  to cover needed medical follow up  Clinical Social Work Clinical Goal(s):  [name] Over the next 90 days, patient will work on completing a medicaid application to cover needed medical follow up  Interventions: . Followed up with patient regarding medicaid application completion and submission. . Confirmed with patient that has submitted the Medicaid application and has kept a copy for her records . Followed up with the status of finding a a new mental health therapist since moving to [facility], patient provided with resources from previous therapist to follow up on . Confirmed that she has not found a new therapist yet but continues to search for one . Discussed  referral to the [facility] for mental health follow up . Continued to emphasize self care, including walking her dogs, reading and playing mine craft . This Child psychotherapist provided patient with positive reinforcement for submitting  Medicaid application, re-emphasizing self care and mental health follow up . Discussed plans with patient for ongoing care management follow up and provided patient with direct contact information for care management team   Patient Self Care Activities:  . Patient verbalizes understanding of plan to completed Medicaid application once received . Unable to perform IADLs  independently . Knowledge deficit related to community resources to meet her medical needs  Please see past updates related to this goal by clicking on the "Past Updates" button in the selected goal          The patient verbalized understanding of instructions provided today and declined a print copy of patient instruction materials.   Telephone follow up appointment with care management team member scheduled for:12/3019

## 2019-12-23 NOTE — Chronic Care Management (AMB) (Signed)
Care Management    Clinical Social Work Follow Up Note  12/23/2019 Name: Leah Bright MRN: [id number] DOB: [birth year]  Leah Bright is a 29 y.o. year old female who is a primary care patient of Flinchum, Kelby Aline, FNP. The CCM team was consulted for assistance with [employer] .   Review of patient status, including review of consultants reports, other relevant assessments, and collaboration with appropriate care team members and the patient's provider was performed as part of comprehensive patient evaluation and provision of chronic care management services.    SDOH (Social Determinants of Health) assessments performed: No    Outpatient Encounter Medications as of 12/23/2019  Medication Sig  . albuterol (PROVENTIL HFA;VENTOLIN HFA) 108 (90 Base) MCG/ACT inhaler Inhale 2 puffs into the lungs every 6 (six) hours as needed for wheezing or shortness of breath.  [name] amLODipine (NORVASC) 10 MG tablet Take 1 tablet (10 mg total) by mouth daily.  . busPIRone (BUSPAR) 5 MG tablet Take 5 mg by mouth 2 (two) times daily.  . butalbital-acetaminophen-caffeine (FIORICET) 50-325-40 MG tablet Take 1 tablet by mouth every 6 (six) hours as needed for headache.  . levothyroxine (SYNTHROID) 50 MCG tablet Take 1 tablet (50 mcg total) by mouth daily.  . metFORMIN (GLUCOPHAGE XR) 500 MG 24 hr tablet Take two 500 mg tablets (total of 1000mg  ) by mouth with breakfast once daily.  . metoprolol succinate (TOPROL XL) 25 MG 24 hr tablet Take 1 tablet (25 mg total) by mouth daily. Check heart rate prior and do not take if heart rate is less than 60 beats per minute.  . norethindrone (MICRONOR) 0.35 MG tablet Take 1 tablet (0.35 mg total) by mouth daily.  . ondansetron (ZOFRAN-ODT) 4 MG disintegrating tablet Take 1-2 tablets (4-8 mg total) by mouth every 8 (eight) hours as needed for nausea.  . propranolol (INDERAL) 10 MG tablet Take 10 mg by mouth 3 (three) times daily.  . traZODone (DESYREL) 100 MG tablet  Take 100 mg by mouth at bedtime as needed for sleep.  [name] venlafaxine XR (EFFEXOR-XR) 75 MG 24 hr capsule Take 75 mg by mouth at bedtime.   No facility-administered encounter medications on file as of 12/23/2019.     Goals Addressed            This Visit's Progress   . "I need a CT scan of my kidneys" (pt-stated)       Current Barriers:  . Financial constraints related to lack of insurance  to cover needed medical follow up  Clinical Social Work Clinical Goal(s):  [name] Over the next 90 days, patient will work on completing a medicaid application to cover needed medical follow up  Interventions: . Followed up with patient regarding medicaid application completion and submission. . Confirmed with patient that has submitted the Medicaid application and has kept a copy for her records . Followed up with the status of finding a a new mental health therapist since moving to [facility], patient provided with resources from previous therapist to follow up on . Confirmed that she has not found a new therapist yet but continues to search for one . Discussed  referral to the [facility] for mental health follow up . Continued to emphasize self care, including walking her dogs, reading and playing mine craft . This Education officer, museum provided patient with positive reinforcement for submitting  Medicaid application, re-emphasizing self care and mental health follow up . Discussed plans with patient for ongoing care  management follow up and provided patient with direct contact information for care management team   Patient Self Care Activities:  . Patient verbalizes understanding of plan to completed Medicaid application once received . Unable to perform IADLs independently . Knowledge deficit related to community resources to meet her medical needs  Please see past updates related to this goal by clicking on the "Past Updates" button in the selected goal          Follow Up Plan: SW will  follow up with patient by phone over the next 2 weeks    Willia Lampert, [place] Clinical Social Worker  [facility] Care Management [phone]

## 2019-12-26 ENCOUNTER — Ambulatory Visit: Payer: Self-pay | Admitting: *Deleted

## 2019-12-26 NOTE — Chronic Care Management (AMB) (Signed)
  Chronic Care Management   Social Work Note  12/26/2019 Name: Leah Bright MRN: [id number] DOB: 01-30-1991  [name] is a 29 y.o. year old female who sees Flinchum, Eula Fried, FNP for primary care. The CCM team was consulted for assistance with Mental Health Counseling and Resources.   Mental health referral sent in to the [facility] for ongoing mental health follow up.  SDOH (Social Determinants of Health) assessments performed: No     Outpatient Encounter Medications as of 12/26/2019  Medication Sig  . albuterol (PROVENTIL HFA;VENTOLIN HFA) 108 (90 Base) MCG/ACT inhaler Inhale 2 puffs into the lungs every 6 (six) hours as needed for wheezing or shortness of breath.  [name] amLODipine (NORVASC) 10 MG tablet Take 1 tablet (10 mg total) by mouth daily.  . busPIRone (BUSPAR) 5 MG tablet Take 5 mg by mouth 2 (two) times daily.  . butalbital-acetaminophen-caffeine (FIORICET) 50-325-40 MG tablet Take 1 tablet by mouth every 6 (six) hours as needed for headache.  . levothyroxine (SYNTHROID) 50 MCG tablet Take 1 tablet (50 mcg total) by mouth daily.  . metFORMIN (GLUCOPHAGE XR) 500 MG 24 hr tablet Take two 500 mg tablets (total of 1000mg  ) by mouth with breakfast once daily.  . metoprolol succinate (TOPROL XL) 25 MG 24 hr tablet Take 1 tablet (25 mg total) by mouth daily. Check heart rate prior and do not take if heart rate is less than 60 beats per minute.  . norethindrone (MICRONOR) 0.35 MG tablet Take 1 tablet (0.35 mg total) by mouth daily.  . ondansetron (ZOFRAN-ODT) 4 MG disintegrating tablet Take 1-2 tablets (4-8 mg total) by mouth every 8 (eight) hours as needed for nausea.  . propranolol (INDERAL) 10 MG tablet Take 10 mg by mouth 3 (three) times daily.  . traZODone (DESYREL) 100 MG tablet Take 100 mg by mouth at bedtime as needed for sleep.  venlafaxine XR (EFFEXOR-XR) 75 MG 24 hr capsule Take 75 mg by mouth at bedtime.   No facility-administered encounter medications on file  as of 12/26/2019.    Goals Addressed   None     Follow Up Plan: SW will follow up with patient by phone over the next 7-14 business days   Briellah Baik, 12/28/2019 Clinical Social Worker  [facility] Care Management [phone]

## 2019-12-29 ENCOUNTER — Ambulatory Visit: Payer: Self-pay

## 2019-12-29 ENCOUNTER — Ambulatory Visit
Admission: RE | Admit: 2019-12-29 | Discharge: 2019-12-29 | Disposition: A | Discharge status: Home / Self Care | Payer: Self-pay | Source: Ambulatory Visit | Attending: Physician Assistant | Admitting: Physician Assistant

## 2019-12-29 ENCOUNTER — Encounter: Payer: Self-pay | Admitting: Physician Assistant

## 2019-12-29 ENCOUNTER — Ambulatory Visit: Payer: Self-pay | Admitting: Physician Assistant

## 2019-12-29 ENCOUNTER — Ambulatory Visit
Admission: RE | Admit: 2019-12-29 | Discharge: 2019-12-29 | Disposition: A | Discharge status: Home / Self Care | Payer: Self-pay | Attending: Physician Assistant | Admitting: Physician Assistant

## 2019-12-29 ENCOUNTER — Other Ambulatory Visit: Payer: Self-pay

## 2019-12-29 VITALS — BP 146/102 | HR 81 | Temp 97.3°F | Wt 305.8 lb

## 2019-12-29 DIAGNOSIS — M79674 Pain in right toe(s): Secondary | ICD-10-CM

## 2019-12-29 DIAGNOSIS — M79671 Pain in right foot: Secondary | ICD-10-CM

## 2019-12-29 MED ORDER — HYDROCODONE-ACETAMINOPHEN 5-325 MG PO TABS: 1.0000 | ORAL_TABLET | Freq: Every day | ORAL | 0 refills | Status: AC

## 2019-12-29 NOTE — Patient Instructions (Signed)
Toe Fracture A toe fracture is a break in one of the toe bones (phalanges). This may happen if you:  Drop a heavy object on your toe.  Stub your toe.  Twist your toe.  Exercise the same way too much. What are the signs or symptoms? The main symptoms are swelling and pain in the toe. You may also have:  Bruising.  Stiffness.  Numbness.  A change in the way the toe looks.  Broken bones that poke through the skin.  Blood under the toenail. How is this treated? Treatments may include:  Taping the broken toe to a toe that is next to it (buddy taping).  Wearing a shoe that has a wide, rigid sole to protect the toe and to limit its movement.  Wearing a cast.  Surgery. This may be needed if the: ? Pieces of broken bone are out of place. ? Bone pokes through the skin.  Physical therapy. Follow these instructions at home: If you have a shoe:  Wear the shoe as told by your doctor. Remove it only as told by your doctor.  Loosen the shoe if your toes tingle, become numb, or turn cold and blue.  Keep the shoe clean and dry. If you have a cast:  Do not put pressure on any part of the cast until it is fully hardened. This may take a few hours.  Do not stick anything inside the cast to scratch your skin.  Check the skin around the cast every day. Tell your doctor about any concerns.  You may put lotion on dry skin around the edges of the cast.  Do not put lotion on the skin under the cast.  Keep the cast clean and dry. Bathing  Do not take baths, swim, or use a hot tub until your doctor says it is okay. Ask your doctor if you can take showers.  If the shoe or cast is not waterproof: ? Do not let it get wet. ? Cover it with a watertight covering when you take a bath or a shower. Activity  Do not use your foot to support your body weight until your doctor says it is okay.  Use crutches as told by your doctor.  Ask your doctor what activities are safe for you  during recovery.  Avoid activities as told by your doctor.  Do exercises as told by your doctor or therapist. Driving  Do not drive or use heavy machinery while taking pain medicine.  Do not drive while wearing a cast on a foot that you use for driving. Managing pain, stiffness, and swelling   Put ice on the injured area if told by your doctor: ? Put ice in a plastic bag. ? Place a towel between your skin and the bag.  If you have a shoe, remove it as told by your doctor.  If you have a cast, place a towel between your cast and the bag. ? Leave the ice on for 20 minutes, 2-3 times per day.  Raise (elevate) the injured area above the level of your heart while you are sitting or lying down. General instructions  If your toe was taped to a toe that is next to it, follow your doctor's instructions for changing the gauze and tape. Change it more often: ? If the gauze and tape get wet. If this happens, dry the space between the toes. ? If the gauze and tape are too tight and they cause your toe to become pale   or to lose feeling (go numb).  If your doctor did not give you a protective shoe, wear sturdy shoes that support your foot. Your shoes should not: ? Pinch your toes. ? Fit tightly against your toes.  Do not use any tobacco products, including cigarettes, chewing tobacco, or e-cigarettes. These can delay bone healing. If you need help quitting, ask your doctor.  Take medicines only as told by your doctor.  Keep all follow-up visits as told by your doctor. This is important. Contact a doctor if:  Your pain medicine is not helping.  You have a fever.  You notice a bad smell coming from your cast. Get help right away if:  You lose feeling (have numbness) in your toe or foot, and it is getting worse.  Your toe or your foot tingles.  Your toe or your foot gets cold or turns blue.  You have redness or swelling in your toe or foot, and it is getting worse.  You have very  bad pain. Summary  A toe fracture is a break in one of the toe bones.  Use ice and raise your foot. This will help lessen pain and swelling.  Use crutches as told by your doctor. This information is not intended to replace advice given to you by your health care provider. Make sure you discuss any questions you have with your health care provider. Document Revised: 10/22/2017 Document Reviewed: 09/29/2017 Elsevier Patient Education  2020 [employer]  

## 2019-12-29 NOTE — Progress Notes (Signed)
Established patient visit   Patient: Leah Bright   DOB: Jul 14, 1991   29 y.o. Female  MRN: [id number] Visit Date: 12/29/2019  Today's healthcare provider: Trinna Post, PA-C   Chief Complaint  Patient presents with  . Toe Pain  I,Avett Reineck M Gricelda Foland,acting as a scribe for Trinna Post, PA-C.,have documented all relevant documentation on the behalf of Trinna Post, PA-C,as directed by  Trinna Post, PA-C while in the presence of Trinna Post, PA-C.  Subjective    Toe Pain  The incident occurred 2 days ago. The incident occurred at home. The injury mechanism was a direct blow (moving recliner chair). The pain is present in the right toes and right foot. The quality of the pain is described as stabbing and aching. The pain is at a severity of 7/10. The pain is severe. The pain has been constant since onset. Associated symptoms include an inability to bear weight and numbness. Pertinent negatives include no loss of motion, muscle weakness or tingling. She reports no foreign bodies present. The symptoms are aggravated by movement and weight bearing. She has tried ice for the symptoms. The treatment provided no relief.  Patient states that she seen a chiropractic and they states it maybe broken.      Medications: Outpatient Medications Prior to Visit  Medication Sig  . albuterol (PROVENTIL HFA;VENTOLIN HFA) 108 (90 Base) MCG/ACT inhaler Inhale 2 puffs into the lungs every 6 (six) hours as needed for wheezing or shortness of breath.  [name] amLODipine (NORVASC) 10 MG tablet Take 1 tablet (10 mg total) by mouth daily.  . busPIRone (BUSPAR) 5 MG tablet Take 5 mg by mouth 2 (two) times daily.  . butalbital-acetaminophen-caffeine (FIORICET) 50-325-40 MG tablet Take 1 tablet by mouth every 6 (six) hours as needed for headache.  . levothyroxine (SYNTHROID) 50 MCG tablet Take 1 tablet (50 mcg total) by mouth daily.  . metFORMIN (GLUCOPHAGE XR) 500 MG 24 hr tablet Take two 500 mg  tablets (total of 1000mg  ) by mouth with breakfast once daily.  . metoprolol succinate (TOPROL XL) 25 MG 24 hr tablet Take 1 tablet (25 mg total) by mouth daily. Check heart rate prior and do not take if heart rate is less than 60 beats per minute.  . norethindrone (MICRONOR) 0.35 MG tablet Take 1 tablet (0.35 mg total) by mouth daily.  . ondansetron (ZOFRAN-ODT) 4 MG disintegrating tablet Take 1-2 tablets (4-8 mg total) by mouth every 8 (eight) hours as needed for nausea.  . traZODone (DESYREL) 100 MG tablet Take 100 mg by mouth at bedtime as needed for sleep.  [name] venlafaxine XR (EFFEXOR-XR) 75 MG 24 hr capsule Take 75 mg by mouth at bedtime.  . propranolol (INDERAL) 10 MG tablet Take 10 mg by mouth 3 (three) times daily.   No facility-administered medications prior to visit.    Review of Systems  Constitutional: Negative.   Respiratory: Negative.   Cardiovascular: Negative.   Musculoskeletal: Negative.   Neurological: Positive for numbness. Negative for tingling.      Objective    BP (!) 146/102 (BP Location: Left Arm, Patient Position: Sitting, Cuff Size: Large)   Pulse 81   Temp (!) 97.3 F (36.3 C) (Temporal)   Wt (!) 305 lb 12.8 oz (138.7 kg)   LMP 12/15/2019   SpO2 96%   BMI 50.89 kg/m    Physical Exam Constitutional:      Appearance: Normal appearance.  Feet:  Comments: Pain with palpation of left first metatarsal and swelling of right foot Skin:    General: Skin is warm and dry.  Neurological:     General: No focal deficit present.     Mental Status: She is alert and oriented to person, place, and time.  Psychiatric:        Mood and Affect: Mood normal.        Behavior: Behavior normal.       No results found for any visits on 12/29/19.  Assessment & Plan    1. Toe pain, right   Patient has listed allergy to narcotics but has tolerated NOrco in past. Small prescription. Advised to be non weight bearing on this foot.   - DG Foot Complete Right;  Future - HYDROcodone-acetaminophen (NORCO/VICODIN) 5-325 MG tablet; Take 1 tablet by mouth daily for 5 days.  Dispense: 5 tablet; Refill: 0  2. Right foot pain  - DG Foot Complete Right; Future - HYDROcodone-acetaminophen (NORCO/VICODIN) 5-325 MG tablet; Take 1 tablet by mouth daily for 5 days.  Dispense: 5 tablet; Refill: 0    Return if symptoms worsen or fail to improve.      ITrey Sailors, PA-C, have reviewed all documentation for this visit. The documentation on 12/29/19 for the exam, diagnosis, procedures, and orders are all accurate and complete.    Maryella Shivers  [facility] [phone] (phone) [phone] (fax)  [facility]

## 2019-12-29 NOTE — Telephone Encounter (Signed)
Pt. Reports she dropped a chair on her right Great toe Tuesday. Swollen and painful. Pain starts under toe and radiates under her foot to the arch of her foot. Is walking on the side of her foot. Request to be seen. Appointment made.  Reason for Disposition . [1] Toe injury AND [2] bad limp or can't wear shoes/sandals  Answer Assessment - Initial Assessment Questions 1. MECHANISM: "How did the injury happen?"      Chair fell on toe 2. ONSET: "When did the injury happen?" (Minutes or hours ago)      Tuesday 3. LOCATION: "What part of the toe is injured?" "Is the nail damaged?"      Right gretatoe 4. APPEARANCE of TOE INJURY: "What does the injury look like?"      Swollen 5. SEVERITY: "Can you use the foot normally?" "Can you walk?"      Walking on side of foot 6. SIZE: For cuts, bruises, or swelling, ask: "How large is it?" (e.g., inches or centimeters;  entire toe)      No 7. PAIN: "Is there pain?" If so, ask: "How bad is the pain?"   (e.g., Scale 1-10; or mild, moderate, severe)     9 8. TETANUS: For any breaks in the skin, ask: "When was the last tetanus booster?"     Unsure 9. DIABETES: "Do you have a history of diabetes or poor circulation in the feet?"     Yes 10. OTHER SYMPTOMS: "Do you have any other symptoms?"        No 11. PREGNANCY: "Is there any chance you are pregnant?" "When was your last menstrual period?"       No  Protocols used: TOE INJURY-A-AH

## 2019-12-30 ENCOUNTER — Encounter: Payer: Self-pay | Admitting: Physician Assistant

## 2019-12-30 ENCOUNTER — Telehealth: Payer: Self-pay

## 2019-12-30 ENCOUNTER — Ambulatory Visit: Payer: Self-pay | Admitting: *Deleted

## 2019-12-30 NOTE — Chronic Care Management (AMB) (Signed)
  Chronic Care Management   Social Work Note  12/30/2019 Name: Leah Bright MRN: [id number] DOB: 10-15-1990  Leah Bright is a 29 y.o. year old female who sees Flinchum, Eula Fried, FNP for primary care. The CCM team was consulted for assistance with Mental Health Counseling and Resources.   Referral completed for the [facility] for ongoing mental health follow up.  SDOH (Social Determinants of Health) assessments performed: No     Outpatient Encounter Medications as of 12/30/2019  Medication Sig  . albuterol (PROVENTIL HFA;VENTOLIN HFA) 108 (90 Base) MCG/ACT inhaler Inhale 2 puffs into the lungs every 6 (six) hours as needed for wheezing or shortness of breath.  [name] amLODipine (NORVASC) 10 MG tablet Take 1 tablet (10 mg total) by mouth daily.  . busPIRone (BUSPAR) 5 MG tablet Take 5 mg by mouth 2 (two) times daily.  . butalbital-acetaminophen-caffeine (FIORICET) 50-325-40 MG tablet Take 1 tablet by mouth every 6 (six) hours as needed for headache.  [name] HYDROcodone-acetaminophen (NORCO/VICODIN) 5-325 MG tablet Take 1 tablet by mouth daily for 5 days.  [name] levothyroxine (SYNTHROID) 50 MCG tablet Take 1 tablet (50 mcg total) by mouth daily.  . metFORMIN (GLUCOPHAGE XR) 500 MG 24 hr tablet Take two 500 mg tablets (total of 1000mg  ) by mouth with breakfast once daily.  . metoprolol succinate (TOPROL XL) 25 MG 24 hr tablet Take 1 tablet (25 mg total) by mouth daily. Check heart rate prior and do not take if heart rate is less than 60 beats per minute.  . norethindrone (MICRONOR) 0.35 MG tablet Take 1 tablet (0.35 mg total) by mouth daily.  . ondansetron (ZOFRAN-ODT) 4 MG disintegrating tablet Take 1-2 tablets (4-8 mg total) by mouth every 8 (eight) hours as needed for nausea.  . propranolol (INDERAL) 10 MG tablet Take 10 mg by mouth 3 (three) times daily.  . traZODone (DESYREL) 100 MG tablet Take 100 mg by mouth at bedtime as needed for sleep.  venlafaxine XR (EFFEXOR-XR) 75 MG 24 hr  capsule Take 75 mg by mouth at bedtime.   No facility-administered encounter medications on file as of 12/30/2019.    Goals Addressed   None     Follow Up Plan: SW will follow up with patient by phone over the next 2 weeks   Chrystal Land, LCSW Clinical Social Worker  [facility] Care Management [phone]

## 2020-01-02 ENCOUNTER — Other Ambulatory Visit: Payer: Self-pay | Admitting: Adult Health

## 2020-01-02 NOTE — Telephone Encounter (Signed)
Patient has been seen for acute visits but TSH not done since 10/2018

## 2020-01-03 MED ORDER — LEVOTHYROXINE SODIUM 50 MCG PO TABS: 50.0000 ug | ORAL_TABLET | Freq: Every day | ORAL | 0 refills | Status: DC

## 2020-01-10 ENCOUNTER — Ambulatory Visit: Payer: Self-pay | Admitting: Adult Health

## 2020-01-11 ENCOUNTER — Other Ambulatory Visit: Payer: Self-pay | Admitting: Adult Health

## 2020-01-11 ENCOUNTER — Ambulatory Visit: Payer: Self-pay | Admitting: *Deleted

## 2020-01-11 DIAGNOSIS — F331 Major depressive disorder, recurrent, moderate: Secondary | ICD-10-CM

## 2020-01-11 DIAGNOSIS — N261 Atrophy of kidney (terminal): Secondary | ICD-10-CM

## 2020-01-11 MED ORDER — METOPROLOL SUCCINATE ER 25 MG PO TB24: 25.0000 mg | ORAL_TABLET | Freq: Every day | ORAL | 0 refills | Status: DC

## 2020-01-11 MED ORDER — METFORMIN HCL ER 500 MG PO TB24: ORAL_TABLET | ORAL | 0 refills | Status: DC

## 2020-01-11 NOTE — Chronic Care Management (AMB) (Signed)
Care Management    Clinical Social Work Follow Up Note  01/11/2020 Name: KEYAUNA GRAEFE MRN: [id number] DOB: Aug 08, 1991  [name] is a 29 y.o. year old female who is a primary care patient of Flinchum, Kelby Aline, FNP. The CCM team was consulted for assistance with Mental Health Counseling and Resources.   Review of patient status, including review of consultants reports, other relevant assessments, and collaboration with appropriate care team members and the patient's provider was performed as part of comprehensive patient evaluation and provision of chronic care management services.    SDOH (Social Determinants of Health) assessments performed: No    Outpatient Encounter Medications as of 01/11/2020  Medication Sig  . albuterol (PROVENTIL HFA;VENTOLIN HFA) 108 (90 Base) MCG/ACT inhaler Inhale 2 puffs into the lungs every 6 (six) hours as needed for wheezing or shortness of breath.  [name] amLODipine (NORVASC) 10 MG tablet Take 1 tablet (10 mg total) by mouth daily.  . busPIRone (BUSPAR) 5 MG tablet Take 5 mg by mouth 2 (two) times daily.  . butalbital-acetaminophen-caffeine (FIORICET) 50-325-40 MG tablet Take 1 tablet by mouth every 6 (six) hours as needed for headache.  . levothyroxine (SYNTHROID) 50 MCG tablet Take 1 tablet (50 mcg total) by mouth daily.  . metFORMIN (GLUCOPHAGE XR) 500 MG 24 hr tablet Take two 500 mg tablets (total of 1000mg  ) by mouth with breakfast once daily.  . metoprolol succinate (TOPROL XL) 25 MG 24 hr tablet Take 1 tablet (25 mg total) by mouth daily. Check heart rate prior and do not take if heart rate is less than 60 beats per minute.  . norethindrone (MICRONOR) 0.35 MG tablet Take 1 tablet (0.35 mg total) by mouth daily.  . ondansetron (ZOFRAN-ODT) 4 MG disintegrating tablet Take 1-2 tablets (4-8 mg total) by mouth every 8 (eight) hours as needed for nausea.  . propranolol (INDERAL) 10 MG tablet Take 10 mg by mouth 3 (three) times daily.  . traZODone  (DESYREL) 100 MG tablet Take 100 mg by mouth at bedtime as needed for sleep.  [name] venlafaxine XR (EFFEXOR-XR) 75 MG 24 hr capsule Take 75 mg by mouth at bedtime.   No facility-administered encounter medications on file as of 01/11/2020.     Goals Addressed            This Visit's Progress   . "I need a CT scan of my kidneys" (pt-stated)       Current Barriers:  . Financial constraints related to lack of insurance  to cover needed medical follow up  Clinical Social Work Clinical Goal(s):  [name] Over the next 90 days, patient will work on completing a medicaid application to cover needed medical follow up  Interventions: . Followed up with patient regarding medicaid application completion and submission. . Confirmed with patient that has submitted the Medicaid application however has not received a reply yet . Followed up with the status of finding a a new mental health therapist since moving to [facility], patient confirmed that he has the initial assessment with the [facility] scheduled for 01/17/20 . Provided positive reinforcement for progress made with Medicaid application and mental health follow up . Continued to emphasize self care, including walking her dogs, reading and playing mine craft . Discussed plans with patient for ongoing care management follow up and provided patient with direct contact information for care management team   Patient Self Care Activities:  . Patient verbalizes understanding of plan to completed Medicaid application once  received . Unable to perform IADLs independently . Knowledge deficit related to community resources to meet her medical needs  Please see past updates related to this goal by clicking on the "Past Updates" button in the selected goal          Follow Up Plan: SW will follow up with patient by phone over the next 2 weeks    Elfego Giammarino, [place] Clinical Social Worker  [facility] Care  Management [phone]

## 2020-01-11 NOTE — Patient Instructions (Signed)
Thank you allowing the Chronic Care Management Team to be a part of your care! It was a pleasure speaking with you today!  1. Please follow up with the Department of Social Services regarding status of your Medicaid application 2. Please follow up with the [facility] on  01/17/20 for initial intake appointment for mental health follow up         CCM (Chronic Care Management) Team    Leah Fairly RN, BSN Nurse Care Coordinator  [phone]  Leah Bright [address], LCSW Clinical Social Worker [phone]  Goals Addressed            This Visit's Progress   . "I need a CT scan of my kidneys" (pt-stated)       Current Barriers:  . Financial constraints related to lack of insurance  to cover needed medical follow up  Clinical Social Work Clinical Goal(s):  [name] Over the next 90 days, patient will work on completing a medicaid application to cover needed medical follow up  Interventions: . Followed up with patient regarding medicaid application completion and submission. . Confirmed with patient that has submitted the Medicaid application however has not received a reply yet . Followed up with the status of finding a a new mental health therapist since moving to [facility], patient confirmed that he has the initial assessment with the [facility] scheduled for 01/17/20 . Provided positive reinforcement for progress made with Medicaid application and mental health follow up . Continued to emphasize self care, including walking her dogs, reading and playing mine craft . Discussed plans with patient for ongoing care management follow up and provided patient with direct contact information for care management team   Patient Self Care Activities:  . Patient verbalizes understanding of plan to completed Medicaid application once received . Unable to perform IADLs independently . Knowledge deficit related to community resources to meet her medical needs  Please see past updates  related to this goal by clicking on the "Past Updates" button in the selected goal          The patient verbalized understanding of instructions provided today and declined a print copy of patient instruction materials.   Telephone follow up appointment with care management team member scheduled for: 01/25/20

## 2020-01-18 NOTE — Progress Notes (Addendum)
Established patient visit   Patient: Leah Bright   DOB: [birth year]   28 y.o. Female  MRN: [id number] Visit Date: 01/19/2020  Today's healthcare provider: Jairo Ben, FNP   Chief Complaint  Patient presents with  . Diabetes  . Hypertension   Subjective    HPI Diabetes Mellitus Type II, Follow-up  She has been taking Metformin XR 500 mg two tablets in the morning and one in the evening since our last visit. Hemoglobin A1C today is 9.2 was 9.1.  She has been exercising walking some, not everyday. She has had sweets and muffins.  She denies any hypoglycemia.   She dropped a recliner on her foot and was told it was not broken- this was her great toe. She reports she is able to bend it now and doing better.She decided to wear a boot she had at home to protect it. She feels it is slowly healing no worsening effects. Bruising is resolving. She declined the need for exam as she does not want to take her boot off.    Anxiety she reports is improving, doing better socially. She has been seeing psychiatry - she left [facility] and going somewhere else now. She is in [place] she can not remember her. She sees her on [facility] she said. She says her medication stayed the same so far.    She saw [facility], she had lisinopril added back to her medications. Her propanolol was discontinued. She has a follow up in June at [facility].   Lab Results  Component Value Date   HGBA1C 9.1 (A) 10/13/2019   HGBA1C 11.4 (H) 07/19/2019   HGBA1C 8.5 (H) 10/26/2018   Wt Readings from Last 3 Encounters:  12/29/19 (!) 305 lb 12.8 oz (138.7 kg)  12/19/19 (!) 306 lb (138.8 kg)  10/26/19 (!) 302 lb (137 kg)   Last seen for diabetes 3 months ago.  Management since then includes none. She reports excellent compliance with treatment. She is not having side effects.   Symptoms: No fatigue No foot ulcerations  Yes appetite changes No nausea    No paresthesia of the feet  No polydipsia  No polyuria No visual disturbances   No vomiting     Home blood sugar records: ranging from 140-193  Episodes of hypoglycemia? No    Current insulin regiment: none Most Recent Eye Exam:07/12/19 Current exercise: none patient states that she is working on doing more outdoor activities. Current diet habits: poor  Pertinent Labs: Lab Results  Component Value Date   CHOL 206 (H) 07/19/2019   HDL 51 07/19/2019   LDLCALC 127 (H) 07/19/2019   TRIG 155 (H) 07/19/2019   CHOLHDL 4.0 07/19/2019   Lab Results  Component Value Date   NA 140 07/19/2019   K 4.2 07/19/2019   CREATININE 0.90 10/04/2019   GFRNONAA 98 07/19/2019   GFRAA 113 07/19/2019   GLUCOSE 236 (H) 07/19/2019     Hypertension, follow-up  BP Readings from Last 3 Encounters:  01/19/20 122/70  12/29/19 (!) 146/102  12/19/19 132/82   Wt Readings from Last 3 Encounters:  12/29/19 (!) 305 lb 12.8 oz (138.7 kg)  12/19/19 (!) 306 lb (138.8 kg)  10/26/19 (!) 302 lb (137 kg)     She was last seen for hypertension 3 months ago.  BP at that visit was 130/82. Management since that visit includes none.  She reports excellent compliance with treatment. She is not having side  effects.  She is following a poor diet. She is not exercising. She does not smoke.  Use of agents associated with hypertension: none.   Outside blood pressures are are not being checked. Symptoms: No chest pain No chest pressure  No palpitations No syncope  No dyspnea No orthopnea  No paroxysmal nocturnal dyspnea No lower extremity edema   Pertinent labs: Lab Results  Component Value Date   CHOL 206 (H) 07/19/2019   HDL 51 07/19/2019   LDLCALC 127 (H) 07/19/2019   TRIG 155 (H) 07/19/2019   CHOLHDL 4.0 07/19/2019   Lab Results  Component Value Date   NA 140 07/19/2019   K 4.2 07/19/2019   CREATININE 0.90 10/04/2019   GFRNONAA 98 07/19/2019   GFRAA 113 07/19/2019   GLUCOSE 236 (H)  07/19/2019     The ASCVD Risk score (Goff DC Jr., et al., 2013) failed to calculate for the following reasons:   The 2013 ASCVD risk score is only valid for ages 60 to 88   ---------------------------------------------------------------------------------------------------  Patient Active Problem List   Diagnosis Date Noted  . Hordeolum externum of left upper eyelid 10/13/2019  . Renal atrophy, right 09/20/2019  . History of diabetes mellitus, type II 09/20/2019  . Asthma, chronic 09/19/2019  . Migraine without aura and without status migrainosus, not intractable 09/18/2019  . Hypothyroidism 08/02/2018  . Essential hypertension 08/02/2018  . Morbid obesity (HCC) 08/02/2018  . Type 2 diabetes mellitus without complication, without long-term current use of insulin (HCC) 08/02/2018  . 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  . 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/02/2018  . Asthma exacerbation 09/28/2015  . CAP (community acquired pneumonia) 09/27/2015  . Hypokalemia 09/27/2015  . Hypomagnesemia 09/27/2015  . Hypoxia 09/27/2015  . Sepsis due to pneumonia (HCC) 09/27/2015  . Hypertension associated with diabetes (HCC) 09/27/2015  . Abdominal pain 01/22/2013  . Diarrhea 01/22/2013  . Rash 01/22/2013   Past Medical History:  Diagnosis Date  . Agitation   . Allergy   . Anxiety   . Asthma   . Chronic kidney disease   . Depression   . Diabetes mellitus without complication (HCC)   . High cholesterol   . Hypertension   . Hypothyroidism   . Left renal artery stenosis (HCC) 09/07/2019  . Migraine   . OCD (obsessive compulsive disorder)   . PTSD (post-traumatic stress disorder)   . Renal artery stenosis (HCC)   . Right renal atrophy   . Social anxiety disorder   . Social anxiety disorder   . Social phobia   . Thyroid disease    Allergies  Allergen Reactions  . Toradol [Ketorolac Tromethamine] Hives  . Codeine Rash  . Other Rash    Cilantro and Cedar trees   . Pepto-Bismol [Bismuth] Rash  . Tramadol Rash       Medications: Outpatient Medications Prior to Visit  Medication Sig Note  . albuterol (PROVENTIL HFA;VENTOLIN HFA) 108 (90 Base) MCG/ACT inhaler Inhale 2 puffs into the lungs every 6 (six) hours as needed for wheezing or shortness of breath.   [name] amLODipine (NORVASC) 10 MG tablet Take 1 tablet (10 mg total) by mouth daily. 01/19/2020: Patient reports she takes at bedtime  . busPIRone (BUSPAR) 5 MG tablet Take 5 mg by mouth 2 (two) times daily.   . butalbital-acetaminophen-caffeine (FIORICET) 50-325-40 MG tablet Take 1 tablet by mouth every 6 (six) hours as needed for headache.   . levothyroxine (SYNTHROID) 50 MCG tablet Take 1 tablet (50  mcg total) by mouth daily.   . metFORMIN (GLUCOPHAGE XR) 500 MG 24 hr tablet Take two 500 mg tablets (total of 1000mg  ) by mouth with breakfast once daily.   . metoprolol succinate (TOPROL XL) 25 MG 24 hr tablet Take 1 tablet (25 mg total) by mouth daily. Check heart rate prior and do not take if heart rate is less than 60 beats per minute.   . norethindrone (MICRONOR) 0.35 MG tablet Take 1 tablet (0.35 mg total) by mouth daily.   . ondansetron (ZOFRAN-ODT) 4 MG disintegrating tablet Take 1-2 tablets (4-8 mg total) by mouth every 8 (eight) hours as needed for nausea.   . traZODone (DESYREL) 100 MG tablet Take 100 mg by mouth at bedtime as needed for sleep. 01/19/2020: Patient reports she takes daily  . venlafaxine XR (EFFEXOR-XR) 75 MG 24 hr capsule Take 75 mg by mouth at bedtime.   . [DISCONTINUED] propranolol (INDERAL) 10 MG tablet Take 10 mg by mouth 3 (three) times daily.    No facility-administered medications prior to visit.    Review of Systems  Constitutional: Negative.     Last metabolic panel Lab Results  Component Value Date   GLUCOSE 236 (H) 07/19/2019   NA 140 07/19/2019   K 4.2 07/19/2019   CL 102 07/19/2019   CO2 25 10/26/2018   BUN 10 07/19/2019   CREATININE 0.90 10/04/2019    GFRNONAA 98 07/19/2019   GFRAA 113 07/19/2019   CALCIUM 10.1 07/19/2019   PHOS 3.0 07/19/2019   PROT 6.8 07/19/2019   ALBUMIN 4.5 07/19/2019   LABGLOB 2.3 07/19/2019   AGRATIO 2.0 07/19/2019   BILITOT 0.7 07/19/2019   ALKPHOS 114 07/19/2019   AST 69 (H) 07/19/2019   ALT 72 (H) 07/19/2019   ANIONGAP 10 10/26/2018   Last lipids Lab Results  Component Value Date   CHOL 206 (H) 07/19/2019   HDL 51 07/19/2019   LDLCALC 127 (H) 07/19/2019   TRIG 155 (H) 07/19/2019   CHOLHDL 4.0 07/19/2019   Last hemoglobin A1c Lab Results  Component Value Date   HGBA1C 9.1 (A) 10/13/2019      Objective    BP 122/70   Pulse 95   Temp (!) 96.9 F (36.1 C) (Oral)   Resp 16   SpO2 98%  BP Readings from Last 3 Encounters:  01/19/20 122/70  12/29/19 (!) 146/102  12/19/19 132/82   Wt Readings from Last 3 Encounters:  12/29/19 (!) 305 lb 12.8 oz (138.7 kg)  12/19/19 (!) 306 lb (138.8 kg)  10/26/19 (!) 302 lb (137 kg)     Results for orders placed or performed in visit on 01/19/20 (from the past 24 hour(s))  POCT glycosylated hemoglobin (Hb A1C)     Status: Abnormal   Collection Time: 01/19/20  3:43 PM  Result Value Ref Range   Hemoglobin A1C 9.2 (A) 4.0 - 5.6 %   HbA1c POC (<> result, manual entry)     HbA1c, POC (prediabetic range)     HbA1c, POC (controlled diabetic range)     Physical Exam Vitals reviewed.  Constitutional:      General: She is not in acute distress.    Appearance: She is obese. She is not ill-appearing, toxic-appearing or diaphoretic.  HENT:     Head: Normocephalic and atraumatic.     Right Ear: Tympanic membrane normal.     Left Ear: Tympanic membrane normal.     Nose: Nose normal.     Mouth/Throat:  Mouth: Mucous membranes are moist.     Pharynx: No oropharyngeal exudate or posterior oropharyngeal erythema.  Eyes:     Extraocular Movements: Extraocular movements intact.     Conjunctiva/sclera: Conjunctivae normal.     Pupils: Pupils are equal, round,  and reactive to light.  Neck:     Vascular: No carotid bruit.  Cardiovascular:     Rate and Rhythm: Normal rate and regular rhythm.     Pulses: Normal pulses.     Heart sounds: Normal heart sounds. No murmur. No friction rub. No gallop.   Pulmonary:     Effort: Pulmonary effort is normal. No respiratory distress.     Breath sounds: Normal breath sounds. No stridor. No wheezing, rhonchi or rales.  Chest:     Chest wall: No tenderness.  Musculoskeletal:        General: Normal range of motion.     Cervical back: Normal range of motion and neck supple. No rigidity or tenderness.  Feet:     Comments: Right foot in boot up to her knee, declines to remove- " feels big toe is better" . Recommended exam.  Lymphadenopathy:     Cervical: No cervical adenopathy.  Skin:    General: Skin is warm.     Capillary Refill: Capillary refill takes less than 2 seconds.  Neurological:     General: No focal deficit present.     Mental Status: She is alert and oriented to person, place, and time.  Psychiatric:        Mood and Affect: Mood normal.        Behavior: Behavior normal.        Thought Content: Thought content normal.        Judgment: Judgment normal.     No results found for any visits on 01/19/20.  Assessment & Plan     Type 2 diabetes mellitus without complication, without long-term current use of insulin (HCC) - Plan: POCT glycosylated hemoglobin (Hb A1C), CBC with Differential/Platelet, metFORMIN (GLUCOPHAGE XR) 500 MG 24 hr tablet  Hypothyroidism, unspecified type - Plan: CBC with Differential/Platelet, TSH  Elevated liver enzymes - Plan: Comprehensive Metabolic Panel (CMET)  Mood disorder (HCC), Chronic  Hypertension associated with diabetes (HCC), Chronic  Morbid obesity (HCC), Chronic  Weight loss. Diet control, nutritional counseling recommended however she is self pay and declines. Hemoglobin A1c 9.2 today was 9.1 at last visit. Increased dosage of Metformin, has been  regularly taking Metformin XR 1500 mg daily- denies any hypoglycemia events.   Meds ordered this encounter  Medications  . metFORMIN (GLUCOPHAGE XR) 500 MG 24 hr tablet    Sig: Take 4 tablets (2,000 mg total) by mouth daily.    Dispense:  180 tablet    Refill:  0    Denies any other refills needed. She will let me know if she does.   Advised patient on healthy choices and healthy lifestyle recommendations. Patient verbalized understanding of all instructions given and denies any further questions at this time.  An After Visit Summary was printed and given to the patient.  Fasting labs overdue. She will go by end of next week. Fast 8 hours water or black coffee overdue.  Orders Placed This Encounter  Procedures  . CBC with Differential/Platelet  . Comprehensive Metabolic Panel (CMET)  . TSH  . POCT glycosylated hemoglobin (Hb A1C)    Return in about 3 months (around 04/20/2020), or if symptoms worsen or fail to improve, for at any time for any  worsening symptoms, Go to Emergency room/ urgent care if worse.  Advised patient call the office or your primary care doctor for an appointment if no improvement within 72 hours or if any symptoms change or worsen at any time  Advised ER or urgent Care if after hours or on weekend. Call 911 for emergency symptoms at any time.Patinet verbalized understanding of all instructions given/reviewed and treatment plan and has no further questions or concerns at this time.     IWellington Hampshire Kaitlin Ardito, FNP, have reviewed all documentation for this visit. The documentation on 01/19/20 for the exam, diagnosis, procedures, and orders are all accurate and complete. Addressed acute and or chronic medical problems today requiring 40 minutes reviewing patients medical record,labs, counseling patient regarding patient's conditions, any medications, answering questions regarding health, and coordination of care as needed. After visit summary patient given copy and  reviewed.    Marcille Buffy, [place] [phone] (phone) [phone] (fax)  [place]

## 2020-01-19 ENCOUNTER — Other Ambulatory Visit: Payer: Self-pay

## 2020-01-19 ENCOUNTER — Encounter: Payer: Self-pay | Admitting: Adult Health

## 2020-01-19 ENCOUNTER — Ambulatory Visit (INDEPENDENT_AMBULATORY_CARE_PROVIDER_SITE_OTHER): Payer: Self-pay | Admitting: Adult Health

## 2020-01-19 VITALS — BP 122/70 | HR 95 | Temp 96.9°F | Resp 16

## 2020-01-19 DIAGNOSIS — I152 Hypertension secondary to endocrine disorders: Secondary | ICD-10-CM

## 2020-01-19 DIAGNOSIS — E1159 Type 2 diabetes mellitus with other circulatory complications: Secondary | ICD-10-CM

## 2020-01-19 DIAGNOSIS — R748 Abnormal levels of other serum enzymes: Secondary | ICD-10-CM | POA: Insufficient documentation

## 2020-01-19 DIAGNOSIS — E119 Type 2 diabetes mellitus without complications: Secondary | ICD-10-CM

## 2020-01-19 DIAGNOSIS — E039 Hypothyroidism, unspecified: Secondary | ICD-10-CM

## 2020-01-19 DIAGNOSIS — F39 Unspecified mood [affective] disorder: Secondary | ICD-10-CM

## 2020-01-19 DIAGNOSIS — I10 Essential (primary) hypertension: Secondary | ICD-10-CM

## 2020-01-19 LAB — POCT GLYCOSYLATED HEMOGLOBIN (HGB A1C): Hemoglobin A1C: 9.2 % — AB (ref 4.0–5.6)

## 2020-01-19 MED ORDER — METFORMIN HCL ER 500 MG PO TB24: 2000.0000 mg | ORAL_TABLET | Freq: Every day | ORAL | 0 refills | Status: DC

## 2020-01-19 NOTE — Patient Instructions (Addendum)
Come back for fasting labs Friday or next week, we need to recheck your thyroid and your liver/ kidney function again.      Metformin extended-release tablets What is this medicine? METFORMIN (met FOR min) is used to treat type 2 diabetes. It helps to control blood sugar. Treatment is combined with diet and exercise. This medicine can be used alone or with other medicines for diabetes. This medicine may be used for other purposes; ask your health care provider or pharmacist if you have questions. COMMON BRAND NAME(S): Fortamet, Glucophage XR, Glumetza What should I tell my health care provider before I take this medicine? They need to know if you have any of these conditions:  anemia  dehydration  heart disease  frequently drink alcohol-containing beverages  kidney disease  liver disease  polycystic ovary syndrome  serious infection or injury  vomiting  an unusual or allergic reaction to metformin, other medicines, foods, dyes, or preservatives  pregnant or trying to get pregnant  breast-feeding How should I use this medicine? Take this medicine by mouth with a glass of water. Follow the directions on the prescription label. Take this medicine with food. Take your medicine at regular intervals. Do not take your medicine more often than directed. Do not stop taking except on your doctor's advice. Talk to your pediatrician regarding the use of this medicine in children. Special care may be needed. Overdosage: If you think you have taken too much of this medicine contact a poison control center or emergency room at once. NOTE: This medicine is only for you. Do not share this medicine with others. What if I miss a dose? If you miss a dose, take it as soon as you can. If it is almost time for your next dose, take only that dose. Do not take double or extra doses. What may interact with this medicine? Do not take this medicine with any of the following medications:  certain  contrast medicines given before X-rays, CT scans, MRI, or other procedures  dofetilide This medicine may also interact with the following medications:  acetazolamide  alcohol  certain antivirals for HIV or hepatitis  certain medicines for blood pressure, heart disease, irregular heart beat  cimetidine  dichlorphenamide  digoxin  diuretics  female hormones, like estrogens or progestins and birth control pills  glycopyrrolate  isoniazid  lamotrigine  memantine  methazolamide  metoclopramide  midodrine  niacin  phenothiazines like chlorpromazine, mesoridazine, prochlorperazine, thioridazine  phenytoin  ranolazine  steroid medicines like prednisone or cortisone  stimulant medicines for attention disorders, weight loss, or to stay awake  thyroid medicines  topiramate  trospium  vandetanib  zonisamide This list may not describe all possible interactions. Give your health care provider a list of all the medicines, herbs, non-prescription drugs, or dietary supplements you use. Also tell them if you smoke, drink alcohol, or use illegal drugs. Some items may interact with your medicine. What should I watch for while using this medicine? Visit your doctor or health care professional for regular checks on your progress. A test called the HbA1C (A1C) will be monitored. This is a simple blood test. It measures your blood sugar control over the last 2 to 3 months. You will receive this test every 3 to 6 months. Learn how to check your blood sugar. Learn the symptoms of low and high blood sugar and how to manage them. Always carry a quick-source of sugar with you in case you have symptoms of low blood sugar. Examples include  hard sugar candy or glucose tablets. Make sure others know that you can choke if you eat or drink when you develop serious symptoms of low blood sugar, such as seizures or unconsciousness. They must get medical help at once. Tell your doctor or  health care professional if you have high blood sugar. You might need to change the dose of your medicine. If you are sick or exercising more than usual, you might need to change the dose of your medicine. Do not skip meals. Ask your doctor or health care professional if you should avoid alcohol. Many nonprescription cough and cold products contain sugar or alcohol. These can affect blood sugar. This medicine may cause ovulation in premenopausal women who do not have regular monthly periods. This may increase your chances of becoming pregnant. You should not take this medicine if you become pregnant or think you may be pregnant. Talk with your doctor or health care professional about your birth control options while taking this medicine. Contact your doctor or health care professional right away if you think you are pregnant. The tablet shell for some brands of this medicine does not dissolve. This is normal. The tablet shell may appear whole in the stool. This is not a cause for concern. If you are going to need surgery, a MRI, CT scan, or other procedure, tell your doctor that you are taking this medicine. You may need to stop taking this medicine before the procedure. Wear a medical ID bracelet or chain, and carry a card that describes your disease and details of your medicine and dosage times. This medicine may cause a decrease in folic acid and vitamin B12. You should make sure that you get enough vitamins while you are taking this medicine. Discuss the foods you eat and the vitamins you take with your health care professional. What side effects may I notice from receiving this medicine? Side effects that you should report to your doctor or health care professional as soon as possible:  allergic reactions like skin rash, itching or hives, swelling of the face, lips, or tongue  breathing problems  feeling faint or lightheaded, falls  muscle aches or pains  signs and symptoms of low blood sugar  such as feeling anxious, confusion, dizziness, increased hunger, unusually weak or tired, sweating, shakiness, cold, irritable, headache, blurred vision, fast heartbeat, loss of consciousness  slow or irregular heartbeat  unusual stomach pain or discomfort  unusually tired or weak Side effects that usually do not require medical attention (report to your doctor or health care professional if they continue or are bothersome):  diarrhea  headache  heartburn  metallic taste in mouth  nausea  stomach gas, upset This list may not describe all possible side effects. Call your doctor for medical advice about side effects. You may report side effects to FDA at 1-800-FDA-1088. Where should I keep my medicine? Keep out of the reach of children. Store at room temperature between 15 and 30 degrees C (59 and 86 degrees F). Protect from light. Throw away any unused medicine after the expiration date. NOTE: This sheet is a summary. It may not cover all possible information. If you have questions about this medicine, talk to your doctor, pharmacist, or health care provider.  2020 Elsevier/Gold Standard (2017-09-24 18:58:32) Hypothyroidism  Hypothyroidism is when the thyroid gland does not make enough of certain hormones (it is underactive). The thyroid gland is a small gland located in the lower front part of the neck, just in front  of the windpipe (trachea). This gland makes hormones that help control how the body uses food for energy (metabolism) as well as how the heart and brain function. These hormones also play a role in keeping your bones strong. When the thyroid is underactive, it produces too little of the hormones thyroxine (T4) and triiodothyronine (T3). What are the causes? This condition may be caused by:  Hashimoto's disease. This is a disease in which the body's disease-fighting system (immune system) attacks the thyroid gland. This is the most common cause.  Viral infections.   Pregnancy.  Certain medicines.  Birth defects.  Past radiation treatments to the head or neck for cancer.  Past treatment with radioactive iodine.  Past exposure to radiation in the environment.  Past surgical removal of part or all of the thyroid.  Problems with a gland in the center of the brain (pituitary gland).  Lack of enough iodine in the diet. What increases the risk? You are more likely to develop this condition if:  You are female.  You have a family history of thyroid conditions.  You use a medicine called lithium.  You take medicines that affect the immune system (immunosuppressants). What are the signs or symptoms? Symptoms of this condition include:  Feeling as though you have no energy (lethargy).  Not being able to tolerate cold.  Weight gain that is not explained by a change in diet or exercise habits.  Lack of appetite.  Dry skin.  Coarse hair.  Menstrual irregularity.  Slowing of thought processes.  Constipation.  Sadness or depression. How is this diagnosed? This condition may be diagnosed based on:  Your symptoms, your medical history, and a physical exam.  Blood tests. You may also have imaging tests, such as an ultrasound or MRI. How is this treated? This condition is treated with medicine that replaces the thyroid hormones that your body does not make. After you begin treatment, it may take several weeks for symptoms to go away. Follow these instructions at home:  Take over-the-counter and prescription medicines only as told by your health care provider.  If you start taking any new medicines, tell your health care provider.  Keep all follow-up visits as told by your health care provider. This is important. ? As your condition improves, your dosage of thyroid hormone medicine may change. ? You will need to have blood tests regularly so that your health care provider can monitor your condition. Contact a health care provider  if:  Your symptoms do not get better with treatment.  You are taking thyroid replacement medicine and you: ? Sweat a lot. ? Have tremors. ? Feel anxious. ? Lose weight rapidly. ? Cannot tolerate heat. ? Have emotional swings. ? Have diarrhea. ? Feel weak. Get help right away if you have:  Chest pain.  An irregular heartbeat.  A rapid heartbeat.  Difficulty breathing. Summary  Hypothyroidism is when the thyroid gland does not make enough of certain hormones (it is underactive).  When the thyroid is underactive, it produces too little of the hormones thyroxine (T4) and triiodothyronine (T3).  The most common cause is Hashimoto's disease, a disease in which the body's disease-fighting system (immune system) attacks the thyroid gland. The condition can also be caused by viral infections, medicine, pregnancy, or past radiation treatment to the head or neck.  Symptoms may include weight gain, dry skin, constipation, feeling as though you do not have energy, and not being able to tolerate cold.  This condition is treated  with medicine to replace the thyroid hormones that your body does not make. This information is not intended to replace advice given to you by your health care provider. Make sure you discuss any questions you have with your health care provider. Document Revised: 07/31/2017 Document Reviewed: 07/29/2017 Elsevier Patient Education  [place]. Diabetes Mellitus and Nutrition, Adult When you have diabetes (diabetes mellitus), it is very important to have healthy eating habits because your blood sugar (glucose) levels are greatly affected by what you eat and drink. Eating healthy foods in the appropriate amounts, at about the same times every day, can help you:  Control your blood glucose.  Lower your risk of heart disease.  Improve your blood pressure.  Reach or maintain a healthy weight. Every person with diabetes is different, and each person has  different needs for a meal plan. Your health care provider may recommend that you work with a diet and nutrition specialist (dietitian) to make a meal plan that is best for you. Your meal plan may vary depending on factors such as:  The calories you need.  The medicines you take.  Your weight.  Your blood glucose, blood pressure, and cholesterol levels.  Your activity level.  Other health conditions you have, such as heart or kidney disease. How do carbohydrates affect me? Carbohydrates, also called carbs, affect your blood glucose level more than any other type of food. Eating carbs naturally raises the amount of glucose in your blood. Carb counting is a method for keeping track of how many carbs you eat. Counting carbs is important to keep your blood glucose at a healthy level, especially if you use insulin or take certain oral diabetes medicines. It is important to know how many carbs you can safely have in each meal. This is different for every person. Your dietitian can help you calculate how many carbs you should have at each meal and for each snack. Foods that contain carbs include:  Bread, cereal, rice, pasta, and crackers.  Potatoes and corn.  Peas, beans, and lentils.  Milk and yogurt.  Fruit and juice.  Desserts, such as cakes, cookies, ice cream, and candy. How does alcohol affect me? Alcohol can cause a sudden decrease in blood glucose (hypoglycemia), especially if you use insulin or take certain oral diabetes medicines. Hypoglycemia can be a life-threatening condition. Symptoms of hypoglycemia (sleepiness, dizziness, and confusion) are similar to symptoms of having too much alcohol. If your health care provider says that alcohol is safe for you, follow these guidelines:  Limit alcohol intake to no more than 1 drink per day for nonpregnant women and 2 drinks per day for men. One drink equals 12 oz of beer, 5 oz of wine, or 1 oz of hard liquor.  Do not drink on an  empty stomach.  Keep yourself hydrated with water, diet soda, or unsweetened iced tea.  Keep in mind that regular soda, juice, and other mixers may contain a lot of sugar and must be counted as carbs. What are tips for following this plan?  Reading food labels  Start by checking the serving size on the "Nutrition Facts" label of packaged foods and drinks. The amount of calories, carbs, fats, and other nutrients listed on the label is based on one serving of the item. Many items contain more than one serving per package.  Check the total grams (g) of carbs in one serving. You can calculate the number of servings of carbs in one serving by dividing  the total carbs by 15. For example, if a food has 30 g of total carbs, it would be equal to 2 servings of carbs.  Check the number of grams (g) of saturated and trans fats in one serving. Choose foods that have low or no amount of these fats.  Check the number of milligrams (mg) of salt (sodium) in one serving. Most people should limit total sodium intake to less than 2,300 mg per day.  Always check the nutrition information of foods labeled as "low-fat" or "nonfat". These foods may be higher in added sugar or refined carbs and should be avoided.  Talk to your dietitian to identify your daily goals for nutrients listed on the label. Shopping  Avoid buying canned, premade, or processed foods. These foods tend to be high in fat, sodium, and added sugar.  Shop around the outside edge of the grocery store. This includes fresh fruits and vegetables, bulk grains, fresh meats, and fresh dairy. Cooking  Use low-heat cooking methods, such as baking, instead of high-heat cooking methods like deep frying.  Cook using healthy oils, such as olive, canola, or sunflower oil.  Avoid cooking with butter, cream, or high-fat meats. Meal planning  Eat meals and snacks regularly, preferably at the same times every day. Avoid going long periods of time without  eating.  Eat foods high in fiber, such as fresh fruits, vegetables, beans, and whole grains. Talk to your dietitian about how many servings of carbs you can eat at each meal.  Eat 4-6 ounces (oz) of lean protein each day, such as lean meat, chicken, fish, eggs, or tofu. One oz of lean protein is equal to: ? 1 oz of meat, chicken, or fish. ? 1 egg. ?  cup of tofu.  Eat some foods each day that contain healthy fats, such as avocado, nuts, seeds, and fish. Lifestyle  Check your blood glucose regularly.  Exercise regularly as told by your health care provider. This may include: ? 150 minutes of moderate-intensity or vigorous-intensity exercise each week. This could be brisk walking, biking, or water aerobics. ? Stretching and doing strength exercises, such as yoga or weightlifting, at least 2 times a week.  Take medicines as told by your health care provider.  Do not use any products that contain nicotine or tobacco, such as cigarettes and e-cigarettes. If you need help quitting, ask your health care provider.  Work with a Social worker or diabetes educator to identify strategies to manage stress and any emotional and social challenges. Questions to ask a health care provider  Do I need to meet with a diabetes educator?  Do I need to meet with a dietitian?  What number can I call if I have questions?  When are the best times to check my blood glucose? Where to find more information:  American Diabetes Association: diabetes.org  Academy of Nutrition and Dietetics: www.eatright.[employer] of Diabetes and Digestive and Kidney Diseases (NIH): [employer] Summary  A healthy meal plan will help you control your blood glucose and maintain a healthy lifestyle.  Working with a diet and nutrition specialist (dietitian) can help you make a meal plan that is best for you.  Keep in mind that carbohydrates (carbs) and alcohol have immediate effects on your blood glucose  levels. It is important to count carbs and to use alcohol carefully. This information is not intended to replace advice given to you by your health care provider. Make sure you discuss any questions you have with  your health care provider. Document Revised: 07/31/2017 Document Reviewed: 09/22/2016 Elsevier Patient Education  2020 [place] ordered this encounter  Medications  . metFORMIN (GLUCOPHAGE XR) 500 MG 24 hr tablet    Sig: Take 4 tablets (2,000 mg total) by mouth daily.    Dispense:  180 tablet    Refill:  0   Medications Discontinued During This Encounter  Medication Reason  . propranolol (INDERAL) 10 MG tablet Discontinued by provider  . metFORMIN (GLUCOPHAGE XR) 500 MG 24 hr tablet Reorder

## 2020-01-25 ENCOUNTER — Ambulatory Visit: Payer: Self-pay | Admitting: *Deleted

## 2020-01-25 DIAGNOSIS — N261 Atrophy of kidney (terminal): Secondary | ICD-10-CM

## 2020-01-25 DIAGNOSIS — F331 Major depressive disorder, recurrent, moderate: Secondary | ICD-10-CM

## 2020-01-26 NOTE — Patient Instructions (Signed)
Thank you allowing the Chronic Care Management Team to be a part of your care! It was a pleasure speaking with you today!  1. Please anticipate a call from the Department of Social Services regarding completing your medicaid application by phone.  CCM (Chronic Care Management) Team   [name] RN, BSN Nurse Care Coordinator  [phone]  [place], LCSW Clinical Social Worker [phone]  Goals Addressed            This Visit's Progress   . "I need a CT scan of my kidneys" (pt-stated)       Current Barriers:  . Financial constraints related to lack of insurance  to cover needed medical follow up  Clinical Social Work Clinical Goal(s):  [name] Over the next 90 days, patient will work on completing a medicaid application to cover needed medical follow up  Interventions: . Followed up with patient regarding medicaid application completion and submission. . Confirmed with patient that has submitted the Medicaid application however has not received a reply yet . Conference call to the Department of Social Services who confirmed that they have not received the application and a request has been made to have a Medicaid worker complete the application by phone within the next 24-48 hours . Followed up with the status of finding a a new mental health therapist since moving to [facility].  . Patient confirmed having an intake appointment with the [facility] and will have her follow up appointment tomorrow-virutally . Provided positive reinforcement for progress made with Medicaid application and mental health follow up . Continued to emphasize self care, including walking her dogs, reading and playing mine craft . Discussed plans with patient for ongoing care management follow up and provided patient with direct contact information for care management team   Patient Self Care Activities:  . Patient verbalizes understanding of plan to completed Medicaid application  once received . Unable to perform IADLs independently . Knowledge deficit related to community resources to meet her medical needs  Please see past updates related to this goal by clicking on the "Past Updates" button in the selected goal          The patient verbalized understanding of instructions provided today and declined a print copy of patient instruction materials.   Telephone follow up appointment with care management team member scheduled for:  02/08/20

## 2020-01-26 NOTE — Chronic Care Management (AMB) (Signed)
Care Management    Clinical Social Work Follow Up Note  01/26/2020 Name: Leah Bright MRN: [id number] DOB: 1991-06-25  Leah Bright is a 29 y.o. year old female who is a primary care patient of Flinchum, Kelby Aline, FNP. The CCM team was consulted for assistance with Mental Health Counseling and Resources.   Review of patient status, including review of consultants reports, other relevant assessments, and collaboration with appropriate care team members and the patient's provider was performed as part of comprehensive patient evaluation and provision of chronic care management services.    SDOH (Social Determinants of Health) assessments performed: No    Outpatient Encounter Medications as of 01/25/2020  Medication Sig Note  . albuterol (PROVENTIL HFA;VENTOLIN HFA) 108 (90 Base) MCG/ACT inhaler Inhale 2 puffs into the lungs every 6 (six) hours as needed for wheezing or shortness of breath.   Leah Bright amLODipine (NORVASC) 10 MG tablet Take 1 tablet (10 mg total) by mouth daily. 01/19/2020: Patient reports she takes at bedtime  . busPIRone (BUSPAR) 5 MG tablet Take 5 mg by mouth 2 (two) times daily.   . butalbital-acetaminophen-caffeine (FIORICET) 50-325-40 MG tablet Take 1 tablet by mouth every 6 (six) hours as needed for headache.   . levothyroxine (SYNTHROID) 50 MCG tablet Take 1 tablet (50 mcg total) by mouth daily.   Leah Bright lisinopril (ZESTRIL) 40 MG tablet Take 40 mg by mouth daily.   . metFORMIN (GLUCOPHAGE XR) 500 MG 24 hr tablet Take 4 tablets (2,000 mg total) by mouth daily.   . metoprolol succinate (TOPROL XL) 25 MG 24 hr tablet Take 1 tablet (25 mg total) by mouth daily. Check heart rate prior and do not take if heart rate is less than 60 beats per minute.   . norethindrone (MICRONOR) 0.35 MG tablet Take 1 tablet (0.35 mg total) by mouth daily.   . ondansetron (ZOFRAN-ODT) 4 MG disintegrating tablet Take 1-2 tablets (4-8 mg total) by mouth every 8 (eight) hours as needed for nausea.  (Patient not taking: Reported on 01/19/2020)   . traZODone (DESYREL) 100 MG tablet Take 100 mg by mouth at bedtime as needed for sleep. 01/19/2020: Patient reports she takes daily  . venlafaxine XR (EFFEXOR-XR) 75 MG 24 hr capsule Take 75 mg by mouth at bedtime.    No facility-administered encounter medications on file as of 01/25/2020.     Goals Addressed            This Visit's Progress   . "I need a CT scan of my kidneys" (pt-stated)       Current Barriers:  . Financial constraints related to lack of insurance  to cover needed medical follow up  Clinical Social Work Clinical Goal(s):  Leah Bright Over the next 90 days, patient will work on completing a medicaid application to cover needed medical follow up  Interventions: . Followed up with patient regarding medicaid application completion and submission. . Confirmed with patient that has submitted the Medicaid application however has not received a reply yet . Conference call to the Department of Social Services who confirmed that they have not received the application and a request has been made to have a Medicaid worker complete the application by phone within the next 24-48 hours . Followed up with the status of finding a a new mental health therapist since moving to [facility].  . Patient confirmed having an intake appointment with the [facility] and will have her follow up appointment tomorrow-virutally . Provided positive reinforcement for  progress made with Medicaid application and mental health follow up . Continued to emphasize self care, including walking her dogs, reading and playing mine craft . Discussed plans with patient for ongoing care management follow up and provided patient with direct contact information for care management team   Patient Self Care Activities:  . Patient verbalizes understanding of plan to completed Medicaid application once received . Unable to perform IADLs independently . Knowledge deficit  related to community resources to meet her medical needs  Please see past updates related to this goal by clicking on the "Past Updates" button in the selected goal          Follow Up Plan: SW will follow up with patient by phone over the next 2 weeks    Delainie Chavana, [place] Clinical Social Worker  [facility] Care Management [phone]

## 2020-02-01 DIAGNOSIS — Z0271 Encounter for disability determination: Secondary | ICD-10-CM

## 2020-02-08 ENCOUNTER — Ambulatory Visit: Payer: Self-pay | Admitting: *Deleted

## 2020-02-08 DIAGNOSIS — F331 Major depressive disorder, recurrent, moderate: Secondary | ICD-10-CM

## 2020-02-08 DIAGNOSIS — N261 Atrophy of kidney (terminal): Secondary | ICD-10-CM

## 2020-02-08 NOTE — Patient Instructions (Signed)
Thank you allowing the Chronic Care Management Team to be a part of your care! It was a pleasure speaking with you today!  1. Please follow up with the Department of Social Services regarding your Medicaid application   CCM (Chronic Care Management) Team   [name] RN, BSN Nurse Care Coordinator  [phone]  Trinity Hyland [address], LCSW Clinical Social Worker [phone]  Goals Addressed            This Visit's Progress   . "I need a CT scan of my kidneys" (pt-stated)       Current Barriers:  . Financial constraints related to lack of insurance  to cover needed medical follow up  Clinical Social Work Clinical Goal(s):  [name] Over the next 90 days, patient will work on completing a medicaid application to cover needed medical follow up  Interventions: . Followed up with patient regarding medicaid application completion and submission. . Confirmed with patient that has submitted the Medicaid and has received a call back from the Department of Social Services who states that she could give the process a few more weeks before it shows up in their system . Patient confirmed that she has completed the intake process with the [facility] and will have her follow up appointment tomorrow-in person . Provided positive reinforcement for progress made with Medicaid application and mental health follow up . Continued to emphasize self care, including walking her dogs, reading and playing mine craft . Discussed plans with patient for ongoing care management follow up and provided patient with direct contact information for care management team   Patient Self Care Activities:  . Patient verbalizes understanding of plan to completed Medicaid application once received . Unable to perform IADLs independently . Knowledge deficit related to community resources to meet her medical needs  Please see past updates related to this goal by clicking on the "Past Updates" button in the selected  goal          The patient verbalized understanding of instructions provided today and declined a print copy of patient instruction materials.   Telephone follow up appointment with care management team member scheduled for:  02/22/20

## 2020-02-08 NOTE — Chronic Care Management (AMB) (Signed)
Chronic Care Management    Clinical Social Work Follow Up Note  02/08/2020 Name: ADORE KITHCART MRN: [id number] DOB: 02/07/1991  [name] is a 29 y.o. year old female who is a primary care patient of Flinchum, Kelby Aline, FNP. The CCM team was consulted for assistance with [employer] .   Review of patient status, including review of consultants reports, other relevant assessments, and collaboration with appropriate care team members and the patient's provider was performed as part of comprehensive patient evaluation and provision of chronic care management services.    SDOH (Social Determinants of Health) assessments performed: No    Outpatient Encounter Medications as of 02/08/2020  Medication Sig Note  . albuterol (PROVENTIL HFA;VENTOLIN HFA) 108 (90 Base) MCG/ACT inhaler Inhale 2 puffs into the lungs every 6 (six) hours as needed for wheezing or shortness of breath.   [name] amLODipine (NORVASC) 10 MG tablet Take 1 tablet (10 mg total) by mouth daily. 01/19/2020: Patient reports she takes at bedtime  . busPIRone (BUSPAR) 5 MG tablet Take 5 mg by mouth 2 (two) times daily.   . butalbital-acetaminophen-caffeine (FIORICET) 50-325-40 MG tablet Take 1 tablet by mouth every 6 (six) hours as needed for headache.   . levothyroxine (SYNTHROID) 50 MCG tablet Take 1 tablet (50 mcg total) by mouth daily.   [name] lisinopril (ZESTRIL) 40 MG tablet Take 40 mg by mouth daily.   . metFORMIN (GLUCOPHAGE XR) 500 MG 24 hr tablet Take 4 tablets (2,000 mg total) by mouth daily.   . metoprolol succinate (TOPROL XL) 25 MG 24 hr tablet Take 1 tablet (25 mg total) by mouth daily. Check heart rate prior and do not take if heart rate is less than 60 beats per minute.   . norethindrone (MICRONOR) 0.35 MG tablet Take 1 tablet (0.35 mg total) by mouth daily.   . ondansetron (ZOFRAN-ODT) 4 MG disintegrating tablet Take 1-2 tablets (4-8 mg total) by mouth every 8 (eight) hours as needed for nausea. (Patient not taking:  Reported on 01/19/2020)   . traZODone (DESYREL) 100 MG tablet Take 100 mg by mouth at bedtime as needed for sleep. 01/19/2020: Patient reports she takes daily  . venlafaxine XR (EFFEXOR-XR) 75 MG 24 hr capsule Take 75 mg by mouth at bedtime.    No facility-administered encounter medications on file as of 02/08/2020.     Goals Addressed            This Visit's Progress   . "I need a CT scan of my kidneys" (pt-stated)       Current Barriers:  . Financial constraints related to lack of insurance  to cover needed medical follow up  Clinical Social Work Clinical Goal(s):  [name] Over the next 90 days, patient will work on completing a medicaid application to cover needed medical follow up  Interventions: . Followed up with patient regarding medicaid application completion and submission. . Confirmed with patient that has submitted the Medicaid and has received a call back from the [place] who states that she could give the process a few more weeks before it shows up in their system . Patient confirmed that she has completed the intake process with the [facility] and will have her follow up appointment tomorrow-in person . Provided positive reinforcement for progress made with Medicaid application and mental health follow up . Continued to emphasize self care, including walking her dogs, reading and playing mine craft . Discussed plans with patient for ongoing care management  follow up and provided patient with direct contact information for care management team   Patient Self Care Activities:  . Patient verbalizes understanding of plan to completed Medicaid application once received . Unable to perform IADLs independently . Knowledge deficit related to community resources to meet her medical needs  Please see past updates related to this goal by clicking on the "Past Updates" button in the selected goal          Follow Up Plan: SW will follow up with patient by  phone over the next 7-14 business days    [place], [place] Clinical Social Worker  [facility] Care Management [phone]

## 2020-02-21 ENCOUNTER — Encounter: Payer: Self-pay | Admitting: Adult Health

## 2020-02-22 ENCOUNTER — Telehealth: Payer: Self-pay | Admitting: *Deleted

## 2020-02-22 ENCOUNTER — Ambulatory Visit: Payer: Self-pay | Admitting: *Deleted

## 2020-02-22 NOTE — Chronic Care Management (AMB) (Signed)
  Chronic Care Management   Social Work Note  02/22/2020 Name: Leah Bright MRN: [id number] DOB: 1990-09-30  Leah Bright is a 29 y.o. year old female who sees Leah Bright, Leah Fried, Leah Bright for primary care. The CCM team was consulted for assistance with Leah Bright .   Phone call to patient to follow up on her referral for mental health care and medicaid application. Patient stated that she was not feeling well and requested a call at another time. Per patient, she has an appointment with her primary care doctor on 02/23/20.  Leah Bright (Social Determinants of Health) assessments performed: No     Outpatient Encounter Medications as of 02/22/2020  Medication Sig Note  . albuterol (PROVENTIL HFA;VENTOLIN HFA) 108 (90 Base) MCG/ACT inhaler Inhale 2 puffs into the lungs every 6 (six) hours as needed for wheezing or shortness of breath.   [name] amLODipine (NORVASC) 10 MG tablet Take 1 tablet (10 mg total) by mouth daily. 01/19/2020: Patient reports she takes at bedtime  . busPIRone (BUSPAR) 5 MG tablet Take 5 mg by mouth 2 (two) times daily.   . butalbital-acetaminophen-caffeine (FIORICET) 50-325-40 MG tablet Take 1 tablet by mouth every 6 (six) hours as needed for headache.   . levothyroxine (SYNTHROID) 50 MCG tablet Take 1 tablet (50 mcg total) by mouth daily.   [name] lisinopril (ZESTRIL) 40 MG tablet Take 40 mg by mouth daily.   . metFORMIN (GLUCOPHAGE XR) 500 MG 24 hr tablet Take 4 tablets (2,000 mg total) by mouth daily.   . metoprolol succinate (TOPROL XL) 25 MG 24 hr tablet Take 1 tablet (25 mg total) by mouth daily. Check heart rate prior and do not take if heart rate is less than 60 beats per minute.   . norethindrone (MICRONOR) 0.35 MG tablet Take 1 tablet (0.35 mg total) by mouth daily.   . ondansetron (ZOFRAN-ODT) 4 MG disintegrating tablet Take 1-2 tablets (4-8 mg total) by mouth every 8 (eight) hours as needed for nausea. (Patient not taking: Reported on 01/19/2020)   . traZODone (DESYREL)  100 MG tablet Take 100 mg by mouth at bedtime as needed for sleep. 01/19/2020: Patient reports she takes daily  . venlafaxine XR (EFFEXOR-XR) 75 MG 24 hr capsule Take 75 mg by mouth at bedtime.    No facility-administered encounter medications on file as of 02/22/2020.    Goals Addressed   None     Follow Up Plan: SW will follow up with patient by phone over the next 7-10 business days   Leah Bright, [place] Clinical Social Worker  [facility] Care Management [phone]

## 2020-02-23 ENCOUNTER — Other Ambulatory Visit: Payer: Self-pay

## 2020-02-23 ENCOUNTER — Encounter: Payer: Self-pay | Admitting: Family Medicine

## 2020-02-23 ENCOUNTER — Emergency Department: Payer: Self-pay

## 2020-02-23 ENCOUNTER — Emergency Department
Admission: EM | Admit: 2020-02-23 | Discharge: 2020-02-24 | Disposition: A | Discharge status: Home / Self Care | Payer: Self-pay | Attending: Emergency Medicine | Admitting: Emergency Medicine

## 2020-02-23 ENCOUNTER — Telehealth (INDEPENDENT_AMBULATORY_CARE_PROVIDER_SITE_OTHER): Payer: Self-pay | Admitting: Family Medicine

## 2020-02-23 DIAGNOSIS — R197 Diarrhea, unspecified: Secondary | ICD-10-CM

## 2020-02-23 DIAGNOSIS — Z79899 Other long term (current) drug therapy: Secondary | ICD-10-CM | POA: Insufficient documentation

## 2020-02-23 DIAGNOSIS — Z7901 Long term (current) use of anticoagulants: Secondary | ICD-10-CM | POA: Insufficient documentation

## 2020-02-23 DIAGNOSIS — J45901 Unspecified asthma with (acute) exacerbation: Secondary | ICD-10-CM | POA: Insufficient documentation

## 2020-02-23 DIAGNOSIS — K529 Noninfective gastroenteritis and colitis, unspecified: Secondary | ICD-10-CM | POA: Insufficient documentation

## 2020-02-23 DIAGNOSIS — Z7984 Long term (current) use of oral hypoglycemic drugs: Secondary | ICD-10-CM | POA: Insufficient documentation

## 2020-02-23 DIAGNOSIS — E1122 Type 2 diabetes mellitus with diabetic chronic kidney disease: Secondary | ICD-10-CM | POA: Insufficient documentation

## 2020-02-23 DIAGNOSIS — R1084 Generalized abdominal pain: Secondary | ICD-10-CM

## 2020-02-23 DIAGNOSIS — E1159 Type 2 diabetes mellitus with other circulatory complications: Secondary | ICD-10-CM | POA: Insufficient documentation

## 2020-02-23 DIAGNOSIS — R112 Nausea with vomiting, unspecified: Secondary | ICD-10-CM

## 2020-02-23 DIAGNOSIS — E039 Hypothyroidism, unspecified: Secondary | ICD-10-CM | POA: Insufficient documentation

## 2020-02-23 DIAGNOSIS — I129 Hypertensive chronic kidney disease with stage 1 through stage 4 chronic kidney disease, or unspecified chronic kidney disease: Secondary | ICD-10-CM | POA: Insufficient documentation

## 2020-02-23 DIAGNOSIS — N189 Chronic kidney disease, unspecified: Secondary | ICD-10-CM | POA: Insufficient documentation

## 2020-02-23 LAB — COMPREHENSIVE METABOLIC PANEL
ALT: 33 U/L (ref 0–44)
AST: 25 U/L (ref 15–41)
Albumin: 4 g/dL (ref 3.5–5.0)
Alkaline Phosphatase: 63 U/L (ref 38–126)
Anion gap: 13 (ref 5–15)
BUN: 15 mg/dL (ref 6–20)
CO2: 20 mmol/L — ABNORMAL LOW (ref 22–32)
Calcium: 9.2 mg/dL (ref 8.9–10.3)
Chloride: 109 mmol/L (ref 98–111)
Creatinine, Ser: 0.85 mg/dL (ref 0.44–1.00)
GFR calc Af Amer: >60 mL/min (ref >60)
GFR calc non Af Amer: >60 mL/min (ref >60)
Glucose, Bld: 147 mg/dL — ABNORMAL HIGH (ref 70–99)
Potassium: 4.3 mmol/L (ref 3.5–5.1)
Sodium: 142 mmol/L (ref 135–145)
Total Bilirubin: 0.5 mg/dL (ref 0.3–1.2)
Total Protein: 7 g/dL (ref 6.5–8.1)

## 2020-02-23 LAB — CBC
HCT: 37.9 % (ref 36.0–46.0)
Hemoglobin: 13 g/dL (ref 12.0–15.0)
MCH: 25.1 pg — ABNORMAL LOW (ref 26.0–34.0)
MCHC: 34.3 g/dL (ref 30.0–36.0)
MCV: 73.2 fL — ABNORMAL LOW (ref 80.0–100.0)
Platelets: 377 10*3/uL (ref 150–400)
RBC: 5.18 MIL/uL — ABNORMAL HIGH (ref 3.87–5.11)
RDW: 16.2 % — ABNORMAL HIGH (ref 11.5–15.5)
WBC: 8.2 10*3/uL (ref 4.0–10.5)
nRBC: 0 % (ref 0.0–0.2)

## 2020-02-23 LAB — URINALYSIS, COMPLETE (UACMP) WITH MICROSCOPIC
Bilirubin Urine: NEGATIVE
Glucose, UA: NEGATIVE mg/dL
Ketones, ur: NEGATIVE mg/dL
Leukocytes,Ua: NEGATIVE
Nitrite: NEGATIVE
Protein, ur: NEGATIVE mg/dL
RBC / HPF: >50 RBC/hpf — ABNORMAL HIGH (ref 0–5)
Specific Gravity, Urine: 1.023 (ref 1.005–1.030)
pH: 5 (ref 5.0–8.0)

## 2020-02-23 LAB — LIPASE, BLOOD: Lipase: 36 U/L (ref 11–51)

## 2020-02-23 LAB — POCT PREGNANCY, URINE: Preg Test, Ur: NEGATIVE

## 2020-02-23 MED ORDER — FENTANYL CITRATE (PF) 100 MCG/2ML IJ SOLN
50.0000 ug | Freq: Once | INTRAMUSCULAR | Status: AC
  Administered 2020-02-23: 50 ug via INTRAVENOUS
  Filled 2020-02-23: qty 2

## 2020-02-23 MED ORDER — ONDANSETRON HCL 4 MG/2ML IJ SOLN
4.0000 mg | Freq: Once | INTRAMUSCULAR | Status: AC
  Administered 2020-02-23: 4 mg via INTRAVENOUS
  Filled 2020-02-23: qty 2

## 2020-02-23 MED ORDER — LACTATED RINGERS IV BOLUS
1000.0000 mL | Freq: Once | INTRAVENOUS | Status: AC
  Administered 2020-02-23: 1000 mL via INTRAVENOUS

## 2020-02-23 NOTE — ED Triage Notes (Signed)
Pt here with abd pain that started a week ago with diarrhea. Pt states that she has been going around 9-11 times a day.

## 2020-02-23 NOTE — Progress Notes (Signed)
Virtual telephone visit    Virtual Visit via Telephone Note   This visit type was conducted due to national recommendations for restrictions regarding the COVID-19 Pandemic (e.g. social distancing) in an effort to limit this patient's exposure and mitigate transmission in our community. Due to her co-morbid illnesses, this patient is at least at moderate risk for complications without adequate follow up. This format is felt to be most appropriate for this patient at this time. The patient did not have access to video technology or had technical difficulties with video requiring transitioning to audio format only (telephone). Physical exam was limited to content and character of the telephone converstion.    Patient location: Home Provider location: Office   Visit Date: 02/23/2020  Today's healthcare provider: Dortha Kern, PA   Chief Complaint  Patient presents with  . Abdominal Pain   Subjective    Abdominal Pain This is a new problem. The current episode started in the past 7 days. The problem occurs constantly. The problem has been gradually worsening. The pain is located in the generalized abdominal region. The pain is moderate. The quality of the pain is cramping. The abdominal pain does not radiate. Associated symptoms include diarrhea, flatus, headaches and nausea. Pertinent negatives include no anorexia, arthralgias, belching, constipation, dysuria, fever, frequency, hematochezia, hematuria, melena, myalgias, vomiting or weight loss. Nothing aggravates the pain.    Past Surgical History:  Procedure Laterality Date  . CHOLECYSTECTOMY  2008  . endoscopy/colonoscopy   2011  . LAPAROSCOPIC GASTRIC BANDING  2007  . LAPAROSCOPIC REPAIR AND REMOVAL OF GASTRIC BAND  2011  . TONSILLECTOMY  1994  . WISDOM TOOTH EXTRACTION     Family History  Problem Relation Age of Onset  . Cancer Mother        brain tumor  . Hypertension Mother   . Depression Mother   . Diabetes  Mother   . Migraines Mother        benign tumor, still gets headaches sometimes now   . Anxiety disorder Father   . Depression Father   . Hypertension Maternal Grandmother   . Thyroid disease Maternal Grandmother   . Diabetes Maternal Grandmother   . Hypertension Maternal Grandfather   . Cancer Maternal Grandfather        myositis  . High blood pressure Maternal Grandfather   . Diabetes Maternal Grandfather   . Stroke Maternal Grandfather    Patient Active Problem List   Diagnosis Date Noted  . Mood disorder (HCC) 01/19/2020  . Elevated liver enzymes 01/19/2020  . Hordeolum externum of left upper eyelid 10/13/2019  . Renal atrophy, right 09/20/2019  . History of diabetes mellitus, type II 09/20/2019  . Asthma, chronic 09/19/2019  . Migraine without aura and without status migrainosus, not intractable 09/18/2019  . Hypothyroidism 08/02/2018  . Essential hypertension 08/02/2018  . Morbid obesity (HCC) 08/02/2018  . Type 2 diabetes mellitus without complication, without long-term current use of insulin (HCC) 08/02/2018  . 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  . 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/02/2018  . Asthma exacerbation 09/28/2015  . CAP (community acquired pneumonia) 09/27/2015  . Hypokalemia 09/27/2015  . Hypomagnesemia 09/27/2015  . Hypoxia 09/27/2015  . Sepsis due to pneumonia (HCC) 09/27/2015  . Hypertension associated with diabetes (HCC) 09/27/2015  . Abdominal pain 01/22/2013  . Diarrhea 01/22/2013  . Rash 01/22/2013   Past Medical History:  Diagnosis Date  . Agitation   . Allergy   . Anxiety   .  Asthma   . Chronic kidney disease   . Depression   . Diabetes mellitus without complication (HCC)   . High cholesterol   . Hypertension   . Hypothyroidism   . Left renal artery stenosis (HCC) 09/07/2019  . Migraine   . OCD (obsessive compulsive disorder)   . PTSD (post-traumatic stress disorder)   . Renal artery stenosis (HCC)   . Right  renal atrophy   . Social anxiety disorder   . Social anxiety disorder   . Social phobia   . Thyroid disease    Social History   Tobacco Use  . Smoking status: Never Smoker  . Smokeless tobacco: Never Used  Vaping Use  . Vaping Use: Never used  Substance Use Topics  . Alcohol use: Yes    Comment: rarely, maybe 1 shot every 2-3 months  . Drug use: Never   Allergies  Allergen Reactions  . Toradol [Ketorolac Tromethamine] Hives  . Codeine Rash  . Other Rash    Cilantro and Cedar trees  . Pepto-Bismol [Bismuth] Rash  . Tramadol Rash   Medications: Outpatient Medications Prior to Visit  Medication Sig  . albuterol (PROVENTIL HFA;VENTOLIN HFA) 108 (90 Base) MCG/ACT inhaler Inhale 2 puffs into the lungs every 6 (six) hours as needed for wheezing or shortness of breath.  [name] amLODipine (NORVASC) 10 MG tablet Take 1 tablet (10 mg total) by mouth daily.  . busPIRone (BUSPAR) 5 MG tablet Take 5 mg by mouth 2 (two) times daily.  . butalbital-acetaminophen-caffeine (FIORICET) 50-325-40 MG tablet Take 1 tablet by mouth every 6 (six) hours as needed for headache.  . levothyroxine (SYNTHROID) 50 MCG tablet Take 1 tablet (50 mcg total) by mouth daily.  [name] lisinopril (ZESTRIL) 40 MG tablet Take 40 mg by mouth daily.  . metFORMIN (GLUCOPHAGE XR) 500 MG 24 hr tablet Take 4 tablets (2,000 mg total) by mouth daily.  . metoprolol succinate (TOPROL XL) 25 MG 24 hr tablet Take 1 tablet (25 mg total) by mouth daily. Check heart rate prior and do not take if heart rate is less than 60 beats per minute.  . norethindrone (MICRONOR) 0.35 MG tablet Take 1 tablet (0.35 mg total) by mouth daily.  . ondansetron (ZOFRAN-ODT) 4 MG disintegrating tablet Take 1-2 tablets (4-8 mg total) by mouth every 8 (eight) hours as needed for nausea.  . traZODone (DESYREL) 100 MG tablet Take 100 mg by mouth at bedtime as needed for sleep.  [name] venlafaxine XR (EFFEXOR-XR) 75 MG 24 hr capsule Take 75 mg by mouth at bedtime.   No  facility-administered medications prior to visit.    Review of Systems  Constitutional: Negative for fever and weight loss.  Gastrointestinal: Positive for abdominal pain, diarrhea, flatus and nausea. Negative for anorexia, constipation, hematochezia, melena and vomiting.  Genitourinary: Negative for dysuria, frequency and hematuria.  Musculoskeletal: Negative for arthralgias and myalgias.  Neurological: Positive for headaches.     Objective    There were no vitals taken for this visit. Physical: No apparent respiratory distress. Complains of acute abdominal pain during telephonic interview.    Assessment & Plan     1. Generalized abdominal pain States she has had abdominal pain and diarrhea several times a day for the past 8 days. Had nausea and vomiting once Saturday or Sunday the past weekend. No fever, hematemesis, melena or hematochezia. Some flatus but no further vomiting the past 4 days with use of Zofran. More abdominal pains today. Has tried some Imodium without significant  relief. Drinking extra fluids (water, Gingerale and OJ). No other family or recent associates with GI symptoms. Suspect significant dehydration and with acute abdominal pain, recommend going to an Urgent Care or the ER for IV rehydration and x-ray evaluation. Patient agrees.   No follow-ups on file.    I discussed the assessment and treatment plan with the patient. The patient was provided an opportunity to ask questions and all were answered. The patient agreed with the plan and demonstrated an understanding of the instructions.   The patient was advised to call back or seek an in-person evaluation if the symptoms worsen or if the condition fails to improve as anticipated.  I provided 14 minutes of non-face-to-face time during this encounter.  Andres Shad, PA, have reviewed all documentation for this visit. The documentation on 02/23/20 for the exam, diagnosis, procedures, and orders are all  accurate and complete.   Vernie Murders, [place] [phone] (phone) [phone] (fax)  [place]

## 2020-02-23 NOTE — Progress Notes (Deleted)
MyChart Video Visit    Virtual Visit via Video Note   This visit type was conducted due to national recommendations for restrictions regarding the COVID-19 Pandemic (e.g. social distancing) in an effort to limit this patient's exposure and mitigate transmission in our community. This patient is at least at moderate risk for complications without adequate follow up. This format is felt to be most appropriate for this patient at this time. Physical exam was limited by quality of the video and audio technology used for the visit.   Patient location: *** Provider location: ***   Patient: Leah Bright   DOB: 1990-12-09   28 y.o. Female  MRN: [id number] Visit Date: 02/23/2020  Today's healthcare provider: Dortha Kern, PA   No chief complaint on file.  Subjective    Abdominal Pain This is a new problem. The current episode started 1 to 4 weeks ago. The onset quality is gradual. The problem has been gradually worsening. The pain is located in the generalized abdominal region. The quality of the pain is cramping. The abdominal pain does not radiate. Associated symptoms include diarrhea, flatus, myalgias and nausea. Pertinent negatives include no anorexia, arthralgias, belching, constipation, dysuria, fever, frequency, headaches, hematochezia, hematuria, melena, vomiting or weight loss. Nothing aggravates the pain. She has tried antacids (zofran) for the symptoms. The treatment provided mild relief.   ***  {Show patient history (optional):23778::" "}  Medications: Outpatient Medications Prior to Visit  Medication Sig  . albuterol (PROVENTIL HFA;VENTOLIN HFA) 108 (90 Base) MCG/ACT inhaler Inhale 2 puffs into the lungs every 6 (six) hours as needed for wheezing or shortness of breath.  [name] amLODipine (NORVASC) 10 MG tablet Take 1 tablet (10 mg total) by mouth daily.  . busPIRone (BUSPAR) 5 MG tablet Take 5 mg by mouth 2 (two) times daily.  . butalbital-acetaminophen-caffeine (FIORICET)  50-325-40 MG tablet Take 1 tablet by mouth every 6 (six) hours as needed for headache.  . levothyroxine (SYNTHROID) 50 MCG tablet Take 1 tablet (50 mcg total) by mouth daily.  [name] lisinopril (ZESTRIL) 40 MG tablet Take 40 mg by mouth daily.  . metFORMIN (GLUCOPHAGE XR) 500 MG 24 hr tablet Take 4 tablets (2,000 mg total) by mouth daily.  . metoprolol succinate (TOPROL XL) 25 MG 24 hr tablet Take 1 tablet (25 mg total) by mouth daily. Check heart rate prior and do not take if heart rate is less than 60 beats per minute.  . norethindrone (MICRONOR) 0.35 MG tablet Take 1 tablet (0.35 mg total) by mouth daily.  . ondansetron (ZOFRAN-ODT) 4 MG disintegrating tablet Take 1-2 tablets (4-8 mg total) by mouth every 8 (eight) hours as needed for nausea. (Patient not taking: Reported on 01/19/2020)  . traZODone (DESYREL) 100 MG tablet Take 100 mg by mouth at bedtime as needed for sleep.  [name] venlafaxine XR (EFFEXOR-XR) 75 MG 24 hr capsule Take 75 mg by mouth at bedtime.   No facility-administered medications prior to visit.    Review of Systems  Constitutional: Negative for fever and weight loss.  Gastrointestinal: Positive for abdominal pain, diarrhea, flatus and nausea. Negative for anorexia, constipation, hematochezia, melena and vomiting.  Genitourinary: Negative for dysuria, frequency and hematuria.  Musculoskeletal: Positive for myalgias. Negative for arthralgias.  Neurological: Negative for headaches.    {Heme  Chem  Endocrine  Serology  Results Review (optional):23779::" "}  Objective    There were no vitals taken for this visit. {Show previous vital signs (optional):23777::" "}  Physical Exam  Assessment & Plan     ***  No follow-ups on file.     I discussed the assessment and treatment plan with the patient. The patient was provided an opportunity to ask questions and all were answered. The patient agreed with the plan and demonstrated an understanding of the instructions.     The patient was advised to call back or seek an in-person evaluation if the symptoms worsen or if the condition fails to improve as anticipated.  I provided *** minutes of non-face-to-face time during this encounter.  {provider attestation***:1}  Vernie Murders, [place] [phone] (phone) [phone] (fax)  [place]

## 2020-02-23 NOTE — ED Provider Notes (Signed)
[facility] Emergency Department Provider Note  ____________________________________________  Time seen: Approximately 11:30 PM  I have reviewed the triage vital signs and the nursing notes.   HISTORY  Chief Complaint Abdominal Pain   HPI Leah Bright is a 29 y.o. female the history of elevated BMI, renal atrophy due to renal artery stenosis, chronic kidney disease, diabetes, hypertension, hyperlipidemia, hypothyroidism who presents for evaluation of abdominal pain, vomiting and diarrhea.  Patient has had 4 days of several daily episodes of watery diarrhea and nonbloody nonbilious emesis.  No melena, hematochezia, hematemesis, coffee-ground emesis.  She is also complaining of constant 7 out of 10 upper abdominal pain that she describes as "someone is twisting my intestines."  No prior history of C. difficile, no recent antibiotic use, no recent travel internationally.  No known sick contact exposures.  No Covid vaccine.  No respiratory symptoms.  No fever or chills.  No history of chronic diarrhea.  Patient status post cholecystectomy and gastric band   Past Medical History:  Diagnosis Date  . Agitation   . Allergy   . Anxiety   . Asthma   . Chronic kidney disease   . Depression   . Diabetes mellitus without complication ([place])   . High cholesterol   . Hypertension   . Hypothyroidism   . Left renal artery stenosis ([place]) 09/07/2019  . Migraine   . OCD (obsessive compulsive disorder)   . PTSD (post-traumatic stress disorder)   . Renal artery stenosis ([place])   . Right renal atrophy   . Social anxiety disorder   . Social anxiety disorder   . Social phobia   . Thyroid disease     Patient Active Problem List   Diagnosis Date Noted  . Mood disorder ([place]) 01/19/2020  . Elevated liver enzymes 01/19/2020  . Hordeolum externum of left upper eyelid 10/13/2019  . Renal atrophy, right 09/20/2019  . History of diabetes mellitus, type II 09/20/2019  .  Asthma, chronic 09/19/2019  . Migraine without aura and without status migrainosus, not intractable 09/18/2019  . Hypothyroidism 08/02/2018  . Essential hypertension 08/02/2018  . Morbid obesity ([place]) 08/02/2018  . Type 2 diabetes mellitus without complication, without long-term current use of insulin ([place]) 08/02/2018  . Moderate recurrent major depression ([place]) 03/02/2018  . Stenosis of left renal artery ([place]) 03/02/2018  . Asthma exacerbation 09/28/2015  . CAP (community acquired pneumonia) 09/27/2015  . Hypokalemia 09/27/2015  . Hypomagnesemia 09/27/2015  . Hypoxia 09/27/2015  . Sepsis due to pneumonia ([place]) 09/27/2015  . Hypertension associated with diabetes ([place]) 09/27/2015  . Abdominal pain 01/22/2013  . Diarrhea 01/22/2013  . Rash 01/22/2013    Past Surgical History:  Procedure Laterality Date  . CHOLECYSTECTOMY  2008  . endoscopy/colonoscopy   2011  . LAPAROSCOPIC GASTRIC BANDING  2007  . LAPAROSCOPIC REPAIR AND REMOVAL OF GASTRIC BAND  2011  . TONSILLECTOMY  1994  . WISDOM TOOTH EXTRACTION      Prior to Admission medications   Medication Sig Start Date End Date Taking? Authorizing Provider  albuterol (PROVENTIL HFA;VENTOLIN HFA) 108 (90 Base) MCG/ACT inhaler Inhale 2 puffs into the lungs every 6 (six) hours as needed for wheezing or shortness of breath. 11/02/18   Soyla Dryer, PA-C  amLODipine (NORVASC) 10 MG tablet Take 1 tablet (10 mg total) by mouth daily. 12/08/19   Flinchum, Kelby Aline, FNP  busPIRone (BUSPAR) 5 MG tablet Take 5 mg by mouth 2 (two) times daily.    [provider]  butalbital-acetaminophen-caffeine (FIORICET) 50-325-40 MG tablet Take 1 tablet by mouth every 6 (six) hours as needed for headache. 09/15/19   Anson Fret, MD  levothyroxine (SYNTHROID) 50 MCG tablet Take 1 tablet (50 mcg total) by mouth daily. 01/03/20   Flinchum, Eula Fried, FNP  lisinopril (ZESTRIL) 40 MG tablet Take 40 mg by mouth daily.    [provider]    metFORMIN (GLUCOPHAGE XR) 500 MG 24 hr tablet Take 4 tablets (2,000 mg total) by mouth daily. 01/19/20   Flinchum, Eula Fried, FNP  metoprolol succinate (TOPROL XL) 25 MG 24 hr tablet Take 1 tablet (25 mg total) by mouth daily. Check heart rate prior and do not take if heart rate is less than 60 beats per minute. 01/11/20   Flinchum, Eula Fried, FNP  norethindrone (MICRONOR) 0.35 MG tablet Take 1 tablet (0.35 mg total) by mouth daily. 07/27/19   Schuman, Christanna R, MD  ondansetron (ZOFRAN ODT) 4 MG disintegrating tablet Take 1 tablet (4 mg total) by mouth every 8 (eight) hours as needed. 02/24/20   Nita Sickle, MD  ondansetron (ZOFRAN-ODT) 4 MG disintegrating tablet Take 1-2 tablets (4-8 mg total) by mouth every 8 (eight) hours as needed for nausea. 09/15/19   Anson Fret, MD  traZODone (DESYREL) 100 MG tablet Take 100 mg by mouth at bedtime as needed for sleep.    [provider]  venlafaxine XR (EFFEXOR-XR) 75 MG 24 hr capsule Take 75 mg by mouth at bedtime.    [provider]    Allergies Toradol [ketorolac tromethamine], Codeine, Other, Pepto-bismol [bismuth], and Tramadol  Family History  Problem Relation Age of Onset  . Cancer Mother        brain tumor  . Hypertension Mother   . Depression Mother   . Diabetes Mother   . Migraines Mother        benign tumor, still gets headaches sometimes now   . Anxiety disorder Father   . Depression Father   . Hypertension Maternal Grandmother   . Thyroid disease Maternal Grandmother   . Diabetes Maternal Grandmother   . Hypertension Maternal Grandfather   . Cancer Maternal Grandfather        myositis  . High blood pressure Maternal Grandfather   . Diabetes Maternal Grandfather   . Stroke Maternal Grandfather     Social History Social History   Tobacco Use  . Smoking status: Never Smoker  . Smokeless tobacco: Never Used  Vaping Use  . Vaping Use: Never used  Substance Use Topics  . Alcohol use: Yes     Comment: rarely, maybe 1 shot every 2-3 months  . Drug use: Never    Review of Systems  Constitutional: Negative for fever. Eyes: Negative for visual changes. ENT: Negative for sore throat. Neck: No neck pain  Cardiovascular: Negative for chest pain. Respiratory: Negative for shortness of breath. Gastrointestinal: + upper abdominal pain, vomiting and diarrhea. Genitourinary: Negative for dysuria. Musculoskeletal: Negative for back pain. Skin: Negative for rash. Neurological: Negative for headaches, weakness or numbness. Psych: No SI or HI  ____________________________________________   PHYSICAL EXAM:  VITAL SIGNS: ED Triage Vitals  Enc Vitals Group     BP 02/23/20 1810 (!) 143/87     Pulse Rate 02/23/20 1810 (!) 105     Resp 02/23/20 1810 18     Temp 02/23/20 1810 98 F (36.7 C)     Temp Source 02/23/20 1810 Oral     SpO2 02/23/20 1810 96 %  Weight 02/23/20 1811 (!) 305 lb (138.3 kg)     Height 02/23/20 1811 5\' 5"  (1.651 m)     Head Circumference --      Peak Flow --      Pain Score 02/23/20 1811 7     Pain Loc --      Pain Edu? --      Excl. in GC? --     Constitutional: Alert and oriented. Well appearing and in no apparent distress. HEENT:      Head: Normocephalic and atraumatic.         Eyes: Conjunctivae are normal. Sclera is non-icteric.       Mouth/Throat: Mucous membranes are moist.       Neck: Supple with no signs of meningismus. Cardiovascular: Regular rate and rhythm. No murmurs, gallops, or rubs. 2+ symmetrical distal pulses are present in all extremities. No JVD. Respiratory: Normal respiratory effort. Lungs are clear to auscultation bilaterally. No wheezes, crackles, or rhonchi.  Gastrointestinal: Soft, tender to palpation in the epigastric and left upper quadrant, and non distended with positive bowel sounds. No rebound or guarding. Genitourinary: No CVA tenderness. Musculoskeletal:  No edema, cyanosis, or erythema of extremities. Neurologic:  Normal speech and language. Face is symmetric. Moving all extremities. No gross focal neurologic deficits are appreciated. Skin: Skin is warm, dry and intact. No rash noted. Psychiatric: Mood and affect are normal. Speech and behavior are normal.  ____________________________________________   LABS (all labs ordered are listed, but only abnormal results are displayed)  Labs Reviewed  COMPREHENSIVE METABOLIC PANEL - Abnormal; Notable for the following components:      Result Value   CO2 20 (*)    Glucose, Bld 147 (*)    All other components within normal limits  CBC - Abnormal; Notable for the following components:   RBC 5.18 (*)    MCV 73.2 (*)    MCH 25.1 (*)    RDW 16.2 (*)    All other components within normal limits  URINALYSIS, COMPLETE (UACMP) WITH MICROSCOPIC - Abnormal; Notable for the following components:   Color, Urine YELLOW (*)    APPearance HAZY (*)    Hgb urine dipstick LARGE (*)    RBC / HPF >50 (*)    Bacteria, UA RARE (*)    All other components within normal limits  C DIFFICILE QUICK SCREEN W PCR REFLEX  GASTROINTESTINAL PANEL BY PCR, STOOL (REPLACES STOOL CULTURE)  LIPASE, BLOOD  POC URINE PREG, ED  POCT PREGNANCY, URINE   ____________________________________________  EKG  none  ____________________________________________  RADIOLOGY  I have personally reviewed the images performed during this visit and I agree with the Radiologist's read.   Interpretation by Radiologist:  CT ABDOMEN PELVIS WO CONTRAST  Result Date: 02/24/2020 CLINICAL DATA:  Abdominal pain and diarrhea EXAM: CT ABDOMEN AND PELVIS WITHOUT CONTRAST TECHNIQUE: Multidetector CT imaging of the abdomen and pelvis was performed following the standard protocol without IV contrast. COMPARISON:  10/04/2019 FINDINGS: LOWER CHEST: Normal. HEPATOBILIARY: Normal hepatic contours. No intra- or extrahepatic biliary dilatation. Status post cholecystectomy. PANCREAS: Normal pancreas. No ductal  dilatation or peripancreatic fluid collection. SPLEEN: Normal. ADRENALS/URINARY TRACT: The adrenal glands are normal. Atrophic right kidney. No hydronephrosis, nephroureterolithiasis or solid renal mass. The urinary bladder is normal for degree of distention STOMACH/BOWEL: There is no hiatal hernia. Normal duodenal course and caliber. No small bowel dilatation or inflammation. No focal colonic abnormality. Normal appendix. VASCULAR/LYMPHATIC: Normal course and caliber of the major abdominal vessels. No abdominal  or pelvic lymphadenopathy. REPRODUCTIVE: Normal uterus. No adnexal mass. MUSCULOSKELETAL. No bony spinal canal stenosis or focal osseous abnormality. OTHER: None. IMPRESSION: No acute abnormality of the abdomen or pelvis. Electronically Signed   By: Deatra Robinson M.D.   On: 02/24/2020 01:04   DG Abdomen 1 View  Result Date: 02/24/2020 CLINICAL DATA:  Abdominal pain which began a week ago with diarrhea, EXAM: ABDOMEN - 1 VIEW COMPARISON:  None. FINDINGS: There are several clustered loops of small bowel in the abdomen which are borderline distended, but do not appear frankly contiguous. Appearance is nonspecific though obstruction is a possibility. There is a moderate amount of stool in the right colon with a diffusely air-filled appearance of the transverse and descending colon as well as a fairly featureless appearance of the descending colon which could suggest sequela of inflammation or mural edema. No pneumatosis or portal venous gas. No discernible free air though limited on supine only film. Cholecystectomy clips in the right upper quadrant. No suspicious calcifications over the urinary tract. Osseous structures are unremarkable. IMPRESSION: 1. Clustered loops of small bowel in the abdomen which are borderline distended. Appearance is nonspecific, high-grade obstruction is unlikely though early obstruction could present similarly. 2. Diffusely air-filled appearance of the transverse and descending  colon with a fairly featureless appearance of the descending colon can be seen with inflammation or mural edema. Correlate with history and exam findings. Electronically Signed   By: Kreg Shropshire M.D.   On: 02/24/2020 00:10      ____________________________________________   PROCEDURES  Procedure(s) performed:yes .1-3 Lead EKG Interpretation Performed by: Nita Sickle, MD Authorized by: Nita Sickle, MD     Interpretation: normal     ECG rate assessment: normal     Rhythm: sinus rhythm     Ectopy: none     Critical Care performed:  None ____________________________________________   INITIAL IMPRESSION / ASSESSMENT AND PLAN / ED COURSE   29 y.o. female the history of elevated BMI, renal atrophy due to renal artery stenosis, chronic kidney disease, diabetes, hypertension, hyperlipidemia, hypothyroidism who presents for evaluation of abdominal pain, vomiting and diarrhea x 4 days.  Patient is in no obvious distress with normal vitals, abdomen is soft with tenderness palpation the epigastric and left upper quadrant with no rebound or guarding.  Ddx viral gastroenteritis versus C. difficile versus colitis versus diverticulitis versus gastritis.  Patient has completely normal labs with no leukocytosis, normal LFTs, normal lipase, normal electrolytes, no anion gap, normal kidney function, normal lipase.  UA showing some blood but patient is currently on her menses.  Pregnancy test is negative.  We will give IV fluids, IV fentanyl, and Zofran.  History gathered from patient and family member who is at bedside.  Old medical records reviewed.  Patient placed on telemetry for close monitoring.     _________________________ 2:00 AM on 02/24/2020 ----------------------------------------- KUB inconclusive therefore patient was sent for a CT which shows no acute abnormality, confirmed by radiology.  She feels improved and is tolerating p.o.  No episodes of vomiting or diarrhea in  the emergency room.  Will discharge home on a bland diet, Zofran, increase oral hydration and follow-up with PCP.  Discussed my standard return precautions.   _____________________________________________ Please note:  Patient was evaluated in Emergency Department today for the symptoms described in the history of present illness. Patient was evaluated in the context of the global COVID-19 pandemic, which necessitated consideration that the patient might be at risk for infection with the SARS-CoV-2 virus  that causes COVID-19. Institutional protocols and algorithms that pertain to the evaluation of patients at risk for COVID-19 are in a state of rapid change based on information released by regulatory bodies including the CDC and federal and state organizations. These policies and algorithms were followed during the patient's care in the ED.  Some ED evaluations and interventions may be delayed as a result of limited staffing during the pandemic.   [place] Controlled Substance Database was reviewed by me. ____________________________________________   FINAL CLINICAL IMPRESSION(S) / ED DIAGNOSES   Final diagnoses:  Nausea vomiting and diarrhea  Gastroenteritis      NEW MEDICATIONS STARTED DURING THIS VISIT:  ED Discharge Orders         Ordered    ondansetron (ZOFRAN ODT) 4 MG disintegrating tablet  Every 8 hours PRN     Discontinue  Reprint     02/24/20 0152           Note:  This document was prepared using Dragon voice recognition software and may include unintentional dictation errors.    Don PerkingVeronese, [place], MD 02/24/20 [phone]

## 2020-02-23 NOTE — ED Notes (Signed)
Pt. POC was Neg. 

## 2020-02-24 ENCOUNTER — Emergency Department: Payer: Self-pay

## 2020-02-24 MED ORDER — ONDANSETRON 4 MG PO TBDP: 4.0000 mg | ORAL_TABLET | Freq: Three times a day (TID) | ORAL | 0 refills | Status: DC | PRN

## 2020-02-24 MED ORDER — ACETAMINOPHEN 500 MG PO TABS
1000.0000 mg | ORAL_TABLET | Freq: Once | ORAL | Status: AC
  Administered 2020-02-24: 1000 mg via ORAL
  Filled 2020-02-24: qty 2

## 2020-02-24 NOTE — ED Notes (Signed)
Pt has been unable to produce a stool specimen at this time. Hat in toilet.

## 2020-02-24 NOTE — Discharge Instructions (Addendum)
Take 2 extra strength tylenol (1000mg ) every 8 hours for pain.  Take Zofran as needed for nausea.  Eat a bland diet and to your symptoms resolved.  Follow-up with your doctor 2 days.  Return to the emergency room for new or worsening abdominal pain or fever or multiple episodes of vomiting diarrhea concerning for dehydration.

## 2020-02-27 ENCOUNTER — Other Ambulatory Visit: Payer: Self-pay | Admitting: Adult Health

## 2020-02-27 MED ORDER — AMLODIPINE BESYLATE 10 MG PO TABS: 10.0000 mg | ORAL_TABLET | Freq: Every day | ORAL | 0 refills | Status: DC

## 2020-02-29 ENCOUNTER — Telehealth: Payer: Self-pay

## 2020-03-07 ENCOUNTER — Other Ambulatory Visit: Payer: Self-pay | Admitting: Adult Health

## 2020-03-07 ENCOUNTER — Encounter: Payer: Self-pay | Admitting: Adult Health

## 2020-03-07 DIAGNOSIS — E039 Hypothyroidism, unspecified: Secondary | ICD-10-CM

## 2020-03-07 DIAGNOSIS — E119 Type 2 diabetes mellitus without complications: Secondary | ICD-10-CM

## 2020-03-07 MED ORDER — LEVOTHYROXINE SODIUM 50 MCG PO TABS: 50.0000 ug | ORAL_TABLET | Freq: Every day | ORAL | 0 refills | Status: DC

## 2020-03-07 NOTE — Progress Notes (Signed)
Orders Placed This Encounter  Procedures  . TSH  . CBC with Differential/Platelet  . Comprehensive Metabolic Panel (CMET)  . HgB A1c   Needs these labs she has been advised to go for tsh previous times.

## 2020-03-19 ENCOUNTER — Other Ambulatory Visit: Payer: Self-pay | Admitting: Adult Health

## 2020-03-19 ENCOUNTER — Ambulatory Visit: Payer: Self-pay | Admitting: Obstetrics and Gynecology

## 2020-03-20 LAB — COMPREHENSIVE METABOLIC PANEL
ALT: 28 IU/L (ref 0–32)
AST: 22 IU/L (ref 0–40)
Albumin/Globulin Ratio: 1.6 (ref 1.2–2.2)
Albumin: 4.1 g/dL (ref 3.8–4.8)
Alkaline Phosphatase: 88 IU/L (ref 48–121)
BUN/Creatinine Ratio: 11 (ref 9–23)
BUN: 8 mg/dL (ref 6–20)
Bilirubin Total: 0.5 mg/dL (ref 0.0–1.2)
CO2: 19 mmol/L — ABNORMAL LOW (ref 20–29)
Calcium: 9.1 mg/dL (ref 8.7–10.2)
Chloride: 107 mmol/L — ABNORMAL HIGH (ref 96–106)
Creatinine, Ser: 0.75 mg/dL (ref 0.57–1.00)
GFR calc Af Amer: 123 mL/min/{1.73_m2} (ref >59)
GFR calc non Af Amer: 107 mL/min/{1.73_m2} (ref >59)
Globulin, Total: 2.5 g/dL (ref 1.5–4.5)
Glucose: 152 mg/dL — ABNORMAL HIGH (ref 65–99)
Potassium: 4.2 mmol/L (ref 3.5–5.2)
Sodium: 142 mmol/L (ref 134–144)
Total Protein: 6.6 g/dL (ref 6.0–8.5)

## 2020-03-20 LAB — CBC WITH DIFFERENTIAL/PLATELET
Basophils Absolute: 0.1 10*3/uL (ref 0.0–0.2)
Basos: 1 %
EOS (ABSOLUTE): 0.1 10*3/uL (ref 0.0–0.4)
Eos: 1 %
Hematocrit: 37.4 % (ref 34.0–46.6)
Hemoglobin: 12.2 g/dL (ref 11.1–15.9)
Immature Grans (Abs): 0 10*3/uL (ref 0.0–0.1)
Immature Granulocytes: 0 %
Lymphocytes Absolute: 2 10*3/uL (ref 0.7–3.1)
Lymphs: 29 %
MCH: 25.2 pg — ABNORMAL LOW (ref 26.6–33.0)
MCHC: 32.6 g/dL (ref 31.5–35.7)
MCV: 77 fL — ABNORMAL LOW (ref 79–97)
Monocytes Absolute: 0.3 10*3/uL (ref 0.1–0.9)
Monocytes: 4 %
Neutrophils Absolute: 4.5 10*3/uL (ref 1.4–7.0)
Neutrophils: 65 %
Platelets: 319 10*3/uL (ref 150–450)
RBC: 4.85 x10E6/uL (ref 3.77–5.28)
RDW: 15.9 % — ABNORMAL HIGH (ref 11.7–15.4)
WBC: 6.9 10*3/uL (ref 3.4–10.8)

## 2020-03-20 LAB — TSH: TSH: 6.33 u[IU]/mL — ABNORMAL HIGH (ref 0.450–4.500)

## 2020-03-20 LAB — HEMOGLOBIN A1C
Est. average glucose Bld gHb Est-mCnc: 189 mg/dL
Hgb A1c MFr Bld: 8.2 % — ABNORMAL HIGH (ref 4.8–5.6)

## 2020-03-22 ENCOUNTER — Other Ambulatory Visit: Payer: Self-pay | Admitting: Adult Health

## 2020-03-22 ENCOUNTER — Encounter: Payer: Self-pay | Admitting: Adult Health

## 2020-03-22 DIAGNOSIS — E039 Hypothyroidism, unspecified: Secondary | ICD-10-CM

## 2020-03-22 MED ORDER — METOPROLOL SUCCINATE ER 25 MG PO TB24: 25.0000 mg | ORAL_TABLET | Freq: Every day | ORAL | 0 refills | Status: DC

## 2020-03-22 MED ORDER — LEVOTHYROXINE SODIUM 75 MCG PO TABS: 75.0000 ug | ORAL_TABLET | Freq: Every day | ORAL | 0 refills | Status: DC

## 2020-03-22 NOTE — Progress Notes (Signed)
Meds ordered this encounter  Medications   levothyroxine (SYNTHROID) 75 MCG tablet    Sig: Take 1 tablet (75 mcg total) by mouth daily before breakfast. Recheck lab around 06/23/20.    Dispense:  90 tablet    Refill:  0    Medications Discontinued During This Encounter  Medication Reason   levothyroxine (SYNTHROID) 50 MCG tablet Completed Course

## 2020-03-22 NOTE — Progress Notes (Signed)
Please add on a iron/ tibc for diagnosis of increased RDW. Hemoglobin A1c is improved from 2 months ago is now 8.2 she can continue her Metformin as prescribed and we will recheck at her office visit and discuss further treatment to help better control her diabetes if needed.  Continue diet and exercise and lifestyle modifications.  Blood glucose fasting was 152 on CMP  CMP with electrolytes, kidney and liver function within normal limits. CBC shows that she is trying to be mildly anemic, and adding on iron studies to her labs, and may add in a 1 a day multivitamin with iron or if her iron comes back low we will send in a prescription.  TSH is improved from 10 months ago, patient had not had lab rechecked in a while and she was unable to get to the lab.  She has been on the current doses on and off and needs to increase Synthroid to 75 mcg 1 tablet p.o. daily.  With recommendation to have this level TSH rechecked around 3 months, that would be around 06/22/2020.  I have sent a new prescription into the pharmacy, due to cost and her not having an insurance if she has lots of 50 mcg tablets left she can do 1-1/2 tablets/day until those are gone and then pick up her new prescription. She does have an coming office visit scheduled already.

## 2020-03-24 LAB — IRON AND TIBC
Iron Saturation: 16 % (ref 15–55)
Iron: 62 ug/dL (ref 27–159)
Total Iron Binding Capacity: 395 ug/dL (ref 250–450)
UIBC: 333 ug/dL (ref 131–425)

## 2020-03-24 LAB — SPECIMEN STATUS REPORT

## 2020-03-30 ENCOUNTER — Telehealth: Payer: Self-pay | Admitting: *Deleted

## 2020-03-30 ENCOUNTER — Telehealth: Payer: Self-pay

## 2020-03-30 NOTE — Telephone Encounter (Signed)
° °   Care Management   Unsuccessful Call Note 03/30/2020 Name: Leah Bright MRN: [id number] DOB: 24-May-1991  Patient  is a 29 year old female who sees Marvell Fuller, FNPfor primary care. Marvell Fuller, FNP asked the CCM team to consult the patient for community resources.     This social worker was unable to reach patient via telephone today for follow up call regarding the status of her Medicaid application. I have left HIPAA compliant voicemail asking patient to return my call. (unsuccessful outreach #1).   Plan: Will follow-up within 7 business days via telephone.      Verna Czech, LCSW Clinical Social Worker  [facility] Care Management [phone]

## 2020-04-04 ENCOUNTER — Ambulatory Visit: Payer: Self-pay | Admitting: *Deleted

## 2020-04-04 DIAGNOSIS — E039 Hypothyroidism, unspecified: Secondary | ICD-10-CM

## 2020-04-04 DIAGNOSIS — E119 Type 2 diabetes mellitus without complications: Secondary | ICD-10-CM

## 2020-04-04 NOTE — Chronic Care Management (AMB) (Signed)
Care Management    Clinical Social Work Follow Up Note  04/04/2020 Name: Leah Bright MRN: [id number] DOB: 06/21/1991  Leah Bright is a 29 y.o. year old female who is a primary care patient of Flinchum, Eula Fried, FNP. The CCM team was consulted for assistance with Mental Health Counseling and Resources.   Review of patient status, including review of consultants reports, other relevant assessments, and collaboration with appropriate care team members and the patient's provider was performed as part of comprehensive patient evaluation and provision of chronic care management services.    SDOH (Social Determinants of Health) assessments performed: No    Outpatient Encounter Medications as of 04/04/2020  Medication Sig Note  . albuterol (PROVENTIL HFA;VENTOLIN HFA) 108 (90 Base) MCG/ACT inhaler Inhale 2 puffs into the lungs every 6 (six) hours as needed for wheezing or shortness of breath.   [name] amLODipine (NORVASC) 10 MG tablet Take 1 tablet (10 mg total) by mouth daily.   . busPIRone (BUSPAR) 5 MG tablet Take 5 mg by mouth 2 (two) times daily.   . butalbital-acetaminophen-caffeine (FIORICET) 50-325-40 MG tablet Take 1 tablet by mouth every 6 (six) hours as needed for headache.   . levothyroxine (SYNTHROID) 75 MCG tablet Take 1 tablet (75 mcg total) by mouth daily before breakfast. Recheck lab around 06/23/20.   [name] lisinopril (ZESTRIL) 40 MG tablet Take 40 mg by mouth daily.   . metFORMIN (GLUCOPHAGE XR) 500 MG 24 hr tablet Take 4 tablets (2,000 mg total) by mouth daily.   . metoprolol succinate (TOPROL XL) 25 MG 24 hr tablet Take 1 tablet (25 mg total) by mouth daily. Check heart rate prior and do not take if heart rate is less than 60 beats per minute.   . norethindrone (MICRONOR) 0.35 MG tablet Take 1 tablet (0.35 mg total) by mouth daily.   . ondansetron (ZOFRAN ODT) 4 MG disintegrating tablet Take 1 tablet (4 mg total) by mouth every 8 (eight) hours as needed.   . ondansetron  (ZOFRAN-ODT) 4 MG disintegrating tablet Take 1-2 tablets (4-8 mg total) by mouth every 8 (eight) hours as needed for nausea.   . traZODone (DESYREL) 100 MG tablet Take 100 mg by mouth at bedtime as needed for sleep. 01/19/2020: Patient reports she takes daily  . venlafaxine XR (EFFEXOR-XR) 75 MG 24 hr capsule Take 75 mg by mouth at bedtime.    No facility-administered encounter medications on file as of 04/04/2020.     Goals Addressed              This Visit's Progress   .  "I need a CT scan of my kidneys" (pt-stated)        Current Barriers:  . Financial constraints related to lack of insurance  to cover needed medical follow up  Clinical Social Work Clinical Goal(s):  [name] Over the next 90 days, patient will work on completing a medicaid application to cover needed medical follow up  Interventions: . Followed up with patient regarding medicaid application completion and submission. . Confirmed with patient that has submitted the Medicaid but has not received any follow up yet . Patient confirmed that she continues to be active with her therapist with the [facility]-next appointment 04/05/20 at 2:00 pm . Provided positive reinforcement for progress made with Medicaid application and mental health follow up . Continued to emphasize self care and discussed plan to contact the Department of Social Services next week to follow up on application previously submitted .  Discussed plans with patient for ongoing care management follow up and provided patient with direct contact information for care management team   Patient Self Care Activities:  . Patient verbalizes understanding of plan to completed Medicaid application once received . Unable to perform IADLs independently . Knowledge deficit related to community resources to meet her medical needs  Please see past updates related to this goal by clicking on the "Past Updates" button in the selected goal          Follow Up Plan: SW  will follow up with patient by phone over the next 7-10 business days    [place], [place] Clinical Social Worker  [facility] Care Management [phone]

## 2020-04-04 NOTE — Patient Instructions (Signed)
Thank you allowing the Chronic Care Management Team to be a part of your care! It was a pleasure speaking with you today!  1. This Child psychotherapist to call the Department of Social Services to follow up on medicaid application  CCM (Chronic Care Management) Team   [name] RN, BSN Nurse Care Coordinator  [phone]  [place], LCSW Clinical Social Worker [phone]  Goals Addressed              This Visit's Progress   .  "I need a CT scan of my kidneys" (pt-stated)        Current Barriers:  . Financial constraints related to lack of insurance  to cover needed medical follow up  Clinical Social Work Clinical Goal(s):  [name] Over the next 90 days, patient will work on completing a medicaid application to cover needed medical follow up  Interventions: . Followed up with patient regarding medicaid application completion and submission. . Confirmed with patient that has submitted the Medicaid but has not received any follow up yet . Patient confirmed that she continues to be active with her therapist with [facility]-next appointment 04/05/20 at 2:00 pm . Provided positive reinforcement for progress made with Medicaid application and mental health follow up . Continued to emphasize self care and discussed plan to contact the Department of Social Services next week to follow up on application previously submitted . Discussed plans with patient for ongoing care management follow up and provided patient with direct contact information for care management team   Patient Self Care Activities:  . Patient verbalizes understanding of plan to completed Medicaid application once received . Unable to perform IADLs independently . Knowledge deficit related to community resources to meet her medical needs  Please see past updates related to this goal by clicking on the "Past Updates" button in the selected goal          The patient verbalized understanding of  instructions provided today and declined a print copy of patient instruction materials.   Telephone follow up appointment with care management team member scheduled for: 04/10/20

## 2020-04-10 ENCOUNTER — Telehealth: Payer: Self-pay | Admitting: *Deleted

## 2020-04-11 ENCOUNTER — Telehealth: Payer: Self-pay | Admitting: *Deleted

## 2020-04-11 ENCOUNTER — Ambulatory Visit: Payer: Self-pay | Admitting: *Deleted

## 2020-04-11 DIAGNOSIS — N261 Atrophy of kidney (terminal): Secondary | ICD-10-CM

## 2020-04-11 DIAGNOSIS — E119 Type 2 diabetes mellitus without complications: Secondary | ICD-10-CM

## 2020-04-11 NOTE — Patient Instructions (Signed)
Thank you allowing the Chronic Care Management Team to be a part of your care! It was a pleasure speaking with you today!  1. Please re-send Medicaid application to [address] [place], [place] [address]  CCM (Chronic Care Management) Team   Elnora Morrison  RN, BSN Nurse Care Coordinator  [phone]  Bralynn Donado [address] LCSW Clinical Social Worker [phone]  Goals Addressed              This Visit's Progress   .  "I need a CT scan of my kidneys" (pt-stated)        Current Barriers:  . Financial constraints related to lack of insurance  to cover needed medical follow up  Clinical Social Work Clinical Goal(s):  [name] Over the next 90 days, patient will work on completing a medicaid application to cover needed medical follow up  Interventions: . Followed up with patient regarding medicaid application completion and submission. . Confirmed with patient that has submitted the Medicaid but has not received any follow up yet . Phone call made to the Department of Social Services and confirmed that they have no record of  receiving the application . Patient agreed to re-send the application, mailing address provided . Discussed plans with patient for ongoing care management follow up and provided patient with direct contact information for care management team   Patient Self Care Activities:  . Patient verbalizes understanding of plan to completed Medicaid application once received . Unable to perform IADLs independently . Knowledge deficit related to community resources to meet her medical needs  Please see past updates related to this goal by clicking on the "Past Updates" button in the selected goal          The patient verbalized understanding of instructions provided today and declined a print copy of patient instruction materials.   Telephone follow up appointment with care management team member scheduled for: 04/25/20

## 2020-04-11 NOTE — Chronic Care Management (AMB) (Signed)
Care Management    Clinical Social Work Follow Up Note  04/11/2020 Name: Leah Bright MRN: [id number] DOB: October 18, 1990  Leah Bright is a 29 y.o. year old female who is a primary care patient of Flinchum, Eula Fried, FNP. The CCM team was consulted for assistance with [facility] .   Review of patient status, including review of consultants reports, other relevant assessments, and collaboration with appropriate care team members and the patient's provider was performed as part of comprehensive patient evaluation and provision of chronic care management services.    SDOH (Social Determinants of Health) assessments performed: No    Outpatient Encounter Medications as of 04/11/2020  Medication Sig Note  . albuterol (PROVENTIL HFA;VENTOLIN HFA) 108 (90 Base) MCG/ACT inhaler Inhale 2 puffs into the lungs every 6 (six) hours as needed for wheezing or shortness of breath.   [name] amLODipine (NORVASC) 10 MG tablet Take 1 tablet (10 mg total) by mouth daily.   . busPIRone (BUSPAR) 5 MG tablet Take 5 mg by mouth 2 (two) times daily.   . butalbital-acetaminophen-caffeine (FIORICET) 50-325-40 MG tablet Take 1 tablet by mouth every 6 (six) hours as needed for headache.   . levothyroxine (SYNTHROID) 75 MCG tablet Take 1 tablet (75 mcg total) by mouth daily before breakfast. Recheck lab around 06/23/20.   [name] lisinopril (ZESTRIL) 40 MG tablet Take 40 mg by mouth daily.   . metFORMIN (GLUCOPHAGE XR) 500 MG 24 hr tablet Take 4 tablets (2,000 mg total) by mouth daily.   . metoprolol succinate (TOPROL XL) 25 MG 24 hr tablet Take 1 tablet (25 mg total) by mouth daily. Check heart rate prior and do not take if heart rate is less than 60 beats per minute.   . norethindrone (MICRONOR) 0.35 MG tablet Take 1 tablet (0.35 mg total) by mouth daily.   . ondansetron (ZOFRAN ODT) 4 MG disintegrating tablet Take 1 tablet (4 mg total) by mouth every 8 (eight) hours as needed.   . ondansetron (ZOFRAN-ODT) 4 MG  disintegrating tablet Take 1-2 tablets (4-8 mg total) by mouth every 8 (eight) hours as needed for nausea.   . traZODone (DESYREL) 100 MG tablet Take 100 mg by mouth at bedtime as needed for sleep. 01/19/2020: Patient reports she takes daily  . venlafaxine XR (EFFEXOR-XR) 75 MG 24 hr capsule Take 75 mg by mouth at bedtime.    No facility-administered encounter medications on file as of 04/11/2020.     Goals Addressed              This Visit's Progress   .  "I need a CT scan of my kidneys" (pt-stated)        Current Barriers:  . Financial constraints related to lack of insurance  to cover needed medical follow up  Clinical Social Work Clinical Goal(s):  [name] Over the next 90 days, patient will work on completing a medicaid application to cover needed medical follow up  Interventions: . Followed up with patient regarding medicaid application completion and submission. . Confirmed with patient that has submitted the Medicaid but has not received any follow up yet . Phone call made to the Department of Social Services and confirmed that they have no record of  receiving the application . Patient agreed to re-send the application, mailing address provided . Discussed plans with patient for ongoing care management follow up and provided patient with direct contact information for care management team   Patient Self Care Activities:  . Patient  verbalizes understanding of plan to completed Medicaid application once received . Unable to perform IADLs independently . Knowledge deficit related to community resources to meet her medical needs  Please see past updates related to this goal by clicking on the "Past Updates" button in the selected goal          Follow Up Plan: SW will follow up with patient by phone over the next 14 business days    [place], [place] Clinical Social Worker  [facility] Care Management [phone]

## 2020-04-16 ENCOUNTER — Encounter: Payer: Self-pay | Admitting: Obstetrics and Gynecology

## 2020-04-16 ENCOUNTER — Other Ambulatory Visit: Payer: Self-pay

## 2020-04-16 ENCOUNTER — Ambulatory Visit (INDEPENDENT_AMBULATORY_CARE_PROVIDER_SITE_OTHER): Payer: Self-pay | Admitting: Obstetrics and Gynecology

## 2020-04-16 VITALS — BP 128/74 | Ht 65.0 in | Wt 309.2 lb

## 2020-04-16 DIAGNOSIS — N939 Abnormal uterine and vaginal bleeding, unspecified: Secondary | ICD-10-CM

## 2020-04-16 DIAGNOSIS — E119 Type 2 diabetes mellitus without complications: Secondary | ICD-10-CM

## 2020-04-16 NOTE — Progress Notes (Signed)
Patient ID: Leah Bright, female   DOB: 1990-10-10, 29 y.o.   MRN: [id number]  Reason for Consult: Gynecologic Exam   Referred by Berniece Pap, F*  Subjective:     HPI:  Leah Bright is a 29 y.o. female she has been slowly improving. She reports she recently took a trip to see her family in [place]. 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 She went to school for music theory and enjoys playing the french horn. 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 She had to sell her [place] horn several years ago, but would like to play again and has been saving money to purchase an instrument.   She has been seeing a therapist and has been having significant improvements in her life and management of anxiety and depression.   Past Medical History:  Diagnosis Date  . Agitation   . Allergy   . Anxiety   . Asthma   . Chronic kidney disease   . Depression   . Diabetes mellitus without complication (HCC)   . High cholesterol   . Hypertension   . Hypothyroidism   . Left renal artery stenosis (HCC) 09/07/2019  . Migraine   . OCD (obsessive compulsive disorder)   . PTSD (post-traumatic stress disorder)   . Renal artery stenosis (HCC)   . Right renal atrophy   . Social anxiety disorder   . Social anxiety disorder   . Social phobia   . Thyroid disease    Family History  Problem Relation Age of Onset  . Cancer Mother        brain tumor  . Hypertension Mother   . Depression Mother   . Diabetes Mother   . Migraines Mother        benign tumor, still gets headaches sometimes now   . Anxiety disorder Father   . Depression Father   . Hypertension Maternal Grandmother   . Thyroid disease Maternal Grandmother   . Diabetes Maternal Grandmother   . Hypertension Maternal Grandfather   . Cancer Maternal Grandfather        myositis  . High blood pressure  Maternal Grandfather   . Diabetes Maternal Grandfather   . Stroke Maternal Grandfather    Past Surgical History:  Procedure Laterality Date  . CHOLECYSTECTOMY  2008  . endoscopy/colonoscopy   2011  . LAPAROSCOPIC GASTRIC BANDING  2007  . LAPAROSCOPIC REPAIR AND REMOVAL OF GASTRIC BAND  2011  . TONSILLECTOMY  1994  . WISDOM TOOTH EXTRACTION      Short Social History:  Social History   Tobacco Use  . Smoking status: Never Smoker  . Smokeless tobacco: Never Used  Substance Use Topics  . Alcohol use: Yes    Comment: rarely, maybe 1 shot every 2-3 months    Allergies  Allergen Reactions  . Toradol [Ketorolac Tromethamine] Hives  . Codeine Rash  . Other Rash    Cilantro and Cedar trees  . Pepto-Bismol [Bismuth] Rash  . Tramadol Rash    Current Outpatient Medications  Medication Sig Dispense Refill  . albuterol (PROVENTIL HFA;VENTOLIN HFA) 108 (90 Base) MCG/ACT inhaler Inhale 2 puffs into the lungs every 6 (six) hours as needed for wheezing or shortness of breath. 1 Inhaler 0  . amLODipine (NORVASC) 10 MG tablet Take 1 tablet (10 mg total) by  mouth daily. 90 tablet 0  . busPIRone (BUSPAR) 5 MG tablet Take 5 mg by mouth 2 (two) times daily.    . butalbital-acetaminophen-caffeine (FIORICET) 50-325-40 MG tablet Take 1 tablet by mouth every 6 (six) hours as needed for headache. 10 tablet 4  . levothyroxine (SYNTHROID) 75 MCG tablet Take 1 tablet (75 mcg total) by mouth daily before breakfast. Recheck lab around 06/23/20. 90 tablet 0  . lisinopril (ZESTRIL) 40 MG tablet Take 40 mg by mouth daily.    . metFORMIN (GLUCOPHAGE XR) 500 MG 24 hr tablet Take 4 tablets (2,000 mg total) by mouth daily. 180 tablet 0  . metoprolol succinate (TOPROL XL) 25 MG 24 hr tablet Take 1 tablet (25 mg total) by mouth daily. Check heart rate prior and do not take if heart rate is less than 60 beats per minute. 90 tablet 0  . norethindrone (MICRONOR) 0.35 MG tablet Take 1 tablet (0.35 mg total) by mouth  daily. 1 Package 11  . ondansetron (ZOFRAN ODT) 4 MG disintegrating tablet Take 1 tablet (4 mg total) by mouth every 8 (eight) hours as needed. 20 tablet 0  . ondansetron (ZOFRAN-ODT) 4 MG disintegrating tablet Take 1-2 tablets (4-8 mg total) by mouth every 8 (eight) hours as needed for nausea. 30 tablet 11  . traZODone (DESYREL) 100 MG tablet Take 100 mg by mouth at bedtime as needed for sleep.    [name] venlafaxine XR (EFFEXOR-XR) 75 MG 24 hr capsule Take 75 mg by mouth at bedtime.     No current facility-administered medications for this visit.    Review of Systems  Constitutional: Negative for chills, fatigue, fever and unexpected weight change.  HENT: Negative for trouble swallowing.  Eyes: Negative for loss of vision.  Respiratory: Negative for cough, shortness of breath and wheezing.  Cardiovascular: Negative for chest pain, leg swelling, palpitations and syncope.  GI: Negative for abdominal pain, blood in stool, diarrhea, nausea and vomiting.  GU: Negative for difficulty urinating, dysuria, frequency and hematuria.  Musculoskeletal: Negative for back pain, leg pain and joint pain.  Skin: Negative for rash.  Neurological: Negative for dizziness, headaches, light-headedness, numbness and seizures.  Psychiatric: Negative for behavioral problem, confusion, depressed mood and sleep disturbance.        Objective:  Objective   Vitals:   04/16/20 1555  BP: 128/74  Weight: (!) 309 lb 3.2 oz (140.3 kg)  Height: 5\' 5"  (1.651 m)   Body mass index is 51.45 kg/m.  Physical Exam Vitals and nursing note reviewed.  Constitutional:      Appearance: She is well-developed.  HENT:     Head: Normocephalic and atraumatic.  Eyes:     Pupils: Pupils are equal, round, and reactive to light.  Cardiovascular:     Rate and Rhythm: Normal rate and regular rhythm.  Pulmonary:     Effort: Pulmonary effort is normal. No respiratory distress.  Skin:    General: Skin is warm and dry.  Neurological:      Mental Status: She is alert and oriented to person, place, and time.  Psychiatric:        Behavior: Behavior normal.        Thought Content: Thought content normal.        Judgment: Judgment normal.    Assessment/Plan:    30 yo G0P0000  with history of irregular menstrual bleeding. She has been taking norethindrone to help regulate her periods and she has noted an improvement. She reports a regular monthly menstrual  cycle. She will at this time. Her ultimate goal is to conceive. We have discussed in the past that weight loss prior to conception can reduce potential complications of pregnancy and aide with conceiving. She is motivated to loose weight. She has hypothyroidism and continues with adjusting her synthroid dose through her PCP.  Discussed how improving her hypothyroidism will help with weight loss goals.  Will refer to nutrition for help with her dietary plans.  She will return for follow up 6 months    Adelene Idler MD [facility], [facility] 04/16/2020 9:30 PM

## 2020-04-16 NOTE — Patient Instructions (Signed)
Diet for Polycystic Ovary Syndrome °Polycystic ovary syndrome (PCOS) is a disorder of the chemicals (hormones) that regulate a woman's reproductive system, including monthly periods (menstruation). The condition causes important hormones to be out of balance. PCOS can: °· Stop your periods or make them irregular. °· Cause cysts to develop on your ovaries. °· Make it difficult to get pregnant. °· Stop your body from responding to the effects of insulin (insulin resistance). Insulin resistance can lead to obesity and diabetes. °Changing what you eat can help you manage PCOS and improve your health. Following a balanced diet can help you lose weight and improve the way that your body uses insulin. °What are tips for following this plan? °· Follow a balanced diet for meals and snacks. Eat breakfast, lunch, dinner, and one or two snacks every day. °· Include protein in each meal and snack. °· Choose whole grains instead of products that are made with refined flour. °· Eat a variety of foods. °· Exercise regularly as told by your health care provider. Aim to do 30 or more minutes of exercise on most days of the week. °· If you are overweight or obese: °? Pay attention to how many calories you eat. Cutting down on calories can help you lose weight. °? Work with your health care provider or a diet and nutrition specialist (dietitian) to figure out how many calories you need each day. °What foods can I eat? ° °Fruits °Include a variety of colors and types. All fruits are helpful for PCOS. °Vegetables °Include a variety of colors and types. All vegetables are helpful for PCOS. °Grains °Whole grains, such as whole wheat. Whole-grain breads, crackers, cereals, and pasta. Unsweetened oatmeal, bulgur, barley, quinoa, and brown rice. Tortillas made from corn or whole-wheat flour. °Meats and other proteins °Low-fat (lean) proteins, such as fish, chicken, beans, eggs, and tofu. °Dairy °Low-fat dairy products, such as skim milk,  cheese sticks, and yogurt. °Beverages °Low-fat or fat-free drinks, such as water, low-fat milk, sugar-free drinks, and small amounts of 100% fruit juice. °Seasonings and condiments °Ketchup. Mustard. Barbecue sauce. Relish. Low-fat or fat-free mayonnaise. °Fats and oils °Olive oil or canola oil. Walnuts and almonds. °The items listed above may not be a complete list of recommended foods and beverages. Contact a dietitian for more options. °What foods are not recommended? °Foods that are high in calories or fat. Fried foods. Sweets. Products that are made from refined white flour, including white bread, pastries, white rice, and pasta. °The items listed above may not be a complete list of foods and beverages to avoid. Contact a dietitian for more information. °Summary °· PCOS is a hormonal imbalance that affects a woman's reproductive system. °· You can help to manage your PCOS by exercising regularly and eating a healthy, varied diet of vegetables, fruit, whole grains, low-fat (lean) protein, and low-fat dairy products. °· Changing what you eat can improve the way that your body uses insulin, help your hormones reach normal levels, and help you lose weight. °This information is not intended to replace advice given to you by your health care provider. Make sure you discuss any questions you have with your health care provider. °Document Revised: 12/08/2018 Document Reviewed: 06/22/2017 °Elsevier Patient Education © 2020 [employer] ° °

## 2020-04-20 ENCOUNTER — Ambulatory Visit: Payer: Self-pay | Admitting: Adult Health

## 2020-04-25 ENCOUNTER — Ambulatory Visit: Payer: Self-pay | Admitting: *Deleted

## 2020-04-25 ENCOUNTER — Telehealth: Payer: Self-pay

## 2020-04-25 ENCOUNTER — Telehealth: Payer: Self-pay | Admitting: *Deleted

## 2020-04-25 NOTE — Chronic Care Management (AMB) (Signed)
  Chronic Care Management   Social Work Note  04/25/2020 Name: Leah Bright MRN: [id number] DOB: 01-Jan-1991  [name] is a 29 y.o. year old female who sees Flinchum, Eula Fried, FNP for primary care. The CCM team was consulted for assistance with [employer] .   Return call from patient, mailing address for the Department of Social Services provided. Plan to meet patient for face to face meeting on 05/01/20 same date as her follow up with her primary care provider to follow up on Medicaid application.  SDOH (Social Determinants of Health) assessments performed: No     Outpatient Encounter Medications as of 04/25/2020  Medication Sig Note  . albuterol (PROVENTIL HFA;VENTOLIN HFA) 108 (90 Base) MCG/ACT inhaler Inhale 2 puffs into the lungs every 6 (six) hours as needed for wheezing or shortness of breath.   [name] amLODipine (NORVASC) 10 MG tablet Take 1 tablet (10 mg total) by mouth daily.   . busPIRone (BUSPAR) 5 MG tablet Take 5 mg by mouth 2 (two) times daily.   . butalbital-acetaminophen-caffeine (FIORICET) 50-325-40 MG tablet Take 1 tablet by mouth every 6 (six) hours as needed for headache.   . levothyroxine (SYNTHROID) 75 MCG tablet Take 1 tablet (75 mcg total) by mouth daily before breakfast. Recheck lab around 06/23/20.   [name] lisinopril (ZESTRIL) 40 MG tablet Take 40 mg by mouth daily.   . metFORMIN (GLUCOPHAGE XR) 500 MG 24 hr tablet Take 4 tablets (2,000 mg total) by mouth daily.   . metoprolol succinate (TOPROL XL) 25 MG 24 hr tablet Take 1 tablet (25 mg total) by mouth daily. Check heart rate prior and do not take if heart rate is less than 60 beats per minute.   . norethindrone (MICRONOR) 0.35 MG tablet Take 1 tablet (0.35 mg total) by mouth daily.   . ondansetron (ZOFRAN ODT) 4 MG disintegrating tablet Take 1 tablet (4 mg total) by mouth every 8 (eight) hours as needed.   . ondansetron (ZOFRAN-ODT) 4 MG disintegrating tablet Take 1-2 tablets (4-8 mg total) by mouth every  8 (eight) hours as needed for nausea.   . traZODone (DESYREL) 100 MG tablet Take 100 mg by mouth at bedtime as needed for sleep. 01/19/2020: Patient reports she takes daily  . venlafaxine XR (EFFEXOR-XR) 75 MG 24 hr capsule Take 75 mg by mouth at bedtime.    No facility-administered encounter medications on file as of 04/25/2020.    Goals Addressed   None     Follow Up Plan: SW will follow up with patient by phone over the next 7-10 business days   Ramonte Mena, [place] Clinical Social Worker  [facility] Care Management [phone]

## 2020-04-25 NOTE — Telephone Encounter (Signed)
    Care Management   Unsuccessful Call Note 04/25/2020 Name: Leah Bright MRN: [id number] DOB: Sep 19, 1990   Patient  is a 29 year old female who sees Marvell Fuller, FNP for primary care. Marvell Fuller, FNP asked the CCM team to consult the patient for [employer].     This social worker was unable to reach patient via telephone today for follow up call regarding address to mail her  Medicaid application.  [address] [place], [place] [address]. I have left HIPAA compliant voicemail asking patient to return my call. (unsuccessful outreach #1).   Plan: Will follow-up within 7 business days via telephone.      Verna Czech, LCSW Clinical Social Worker  [facility] Care Management [phone]

## 2020-04-27 ENCOUNTER — Other Ambulatory Visit: Payer: Self-pay | Admitting: Adult Health

## 2020-04-27 DIAGNOSIS — E119 Type 2 diabetes mellitus without complications: Secondary | ICD-10-CM

## 2020-04-27 MED ORDER — METFORMIN HCL ER 500 MG PO TB24: 2000.0000 mg | ORAL_TABLET | Freq: Every day | ORAL | 0 refills | Status: DC

## 2020-04-30 ENCOUNTER — Telehealth: Payer: Self-pay

## 2020-05-01 ENCOUNTER — Ambulatory Visit: Payer: Self-pay | Admitting: Adult Health

## 2020-05-01 ENCOUNTER — Ambulatory Visit: Payer: Self-pay | Admitting: *Deleted

## 2020-05-01 NOTE — Chronic Care Management (AMB) (Signed)
  Chronic Care Management   Social Work Note  05/01/2020 Name: Leah Bright MRN: [id number] DOB: 30-May-1991  Leah Bright is a 29 y.o. year old female who sees Leah Bright, Leah Fried, FNP for primary care. The CCM team was consulted for assistance with Leah Bright .   Phone call to patient to remind her to bring her Medicaid application for review. Per patient her appointment with the doctor has been re-scheduled. Per patient, she will be following up with her therapist today. This social worker re-scheduled face to face appointment for 05/02/20 at 10:30am.  SDOH (Social Determinants of Health) assessments performed: No     Outpatient Encounter Medications as of 05/01/2020  Medication Sig Note  . albuterol (PROVENTIL HFA;VENTOLIN HFA) 108 (90 Base) MCG/ACT inhaler Inhale 2 puffs into the lungs every 6 (six) hours as needed for wheezing or shortness of breath.   [name] amLODipine (NORVASC) 10 MG tablet Take 1 tablet (10 mg total) by mouth daily.   . busPIRone (BUSPAR) 5 MG tablet Take 5 mg by mouth 2 (two) times daily.   . butalbital-acetaminophen-caffeine (FIORICET) 50-325-40 MG tablet Take 1 tablet by mouth every 6 (six) hours as needed for headache.   . levothyroxine (SYNTHROID) 75 MCG tablet Take 1 tablet (75 mcg total) by mouth daily before breakfast. Recheck lab around 06/23/20.   [name] lisinopril (ZESTRIL) 40 MG tablet Take 40 mg by mouth daily.   . metFORMIN (GLUCOPHAGE XR) 500 MG 24 hr tablet Take 4 tablets (2,000 mg total) by mouth daily.   . metoprolol succinate (TOPROL XL) 25 MG 24 hr tablet Take 1 tablet (25 mg total) by mouth daily. Check heart rate prior and do not take if heart rate is less than 60 beats per minute.   . norethindrone (MICRONOR) 0.35 MG tablet Take 1 tablet (0.35 mg total) by mouth daily.   . ondansetron (ZOFRAN ODT) 4 MG disintegrating tablet Take 1 tablet (4 mg total) by mouth every 8 (eight) hours as needed.   . ondansetron (ZOFRAN-ODT) 4 MG disintegrating  tablet Take 1-2 tablets (4-8 mg total) by mouth every 8 (eight) hours as needed for nausea.   . traZODone (DESYREL) 100 MG tablet Take 100 mg by mouth at bedtime as needed for sleep. 01/19/2020: Patient reports she takes daily  . venlafaxine XR (EFFEXOR-XR) 75 MG 24 hr capsule Take 75 mg by mouth at bedtime.    No facility-administered encounter medications on file as of 05/01/2020.    Goals Addressed   None     Follow Up Plan: Appointment scheduled for SW to meet with client in provider office on: 05/02/20   Leah Czech, LCSW Clinical Social Worker  [facility] Care Management [phone]

## 2020-05-02 ENCOUNTER — Ambulatory Visit: Payer: Self-pay | Admitting: *Deleted

## 2020-05-02 DIAGNOSIS — I701 Atherosclerosis of renal artery: Secondary | ICD-10-CM

## 2020-05-02 DIAGNOSIS — F411 Generalized anxiety disorder: Secondary | ICD-10-CM

## 2020-05-02 NOTE — Chronic Care Management (AMB) (Signed)
Care Management    Clinical Social Work Follow Up Note  05/02/2020 Name: TANAYSIA BHARDWAJ MRN: [id number] DOB: [birth year]  [name] is a 29 y.o. year old female who is a primary care patient of Flinchum, Eula Fried, FNP. The CCM team was consulted for assistance with Mental Health Counseling and Resources.   Review of patient status, including review of consultants reports, other relevant assessments, and collaboration with appropriate care team members and the patient's provider was performed as part of comprehensive patient evaluation and provision of chronic care management services.    SDOH (Social Determinants of Health) assessments performed: No    Outpatient Encounter Medications as of 05/02/2020  Medication Sig Note   albuterol (PROVENTIL HFA;VENTOLIN HFA) 108 (90 Base) MCG/ACT inhaler Inhale 2 puffs into the lungs every 6 (six) hours as needed for wheezing or shortness of breath.    amLODipine (NORVASC) 10 MG tablet Take 1 tablet (10 mg total) by mouth daily.    busPIRone (BUSPAR) 5 MG tablet Take 5 mg by mouth 2 (two) times daily.    butalbital-acetaminophen-caffeine (FIORICET) 50-325-40 MG tablet Take 1 tablet by mouth every 6 (six) hours as needed for headache.    levothyroxine (SYNTHROID) 75 MCG tablet Take 1 tablet (75 mcg total) by mouth daily before breakfast. Recheck lab around 06/23/20.    lisinopril (ZESTRIL) 40 MG tablet Take 40 mg by mouth daily.    metFORMIN (GLUCOPHAGE XR) 500 MG 24 hr tablet Take 4 tablets (2,000 mg total) by mouth daily.    metoprolol succinate (TOPROL XL) 25 MG 24 hr tablet Take 1 tablet (25 mg total) by mouth daily. Check heart rate prior and do not take if heart rate is less than 60 beats per minute.    norethindrone (MICRONOR) 0.35 MG tablet Take 1 tablet (0.35 mg total) by mouth daily.    ondansetron (ZOFRAN ODT) 4 MG disintegrating tablet Take 1 tablet (4 mg total) by mouth every 8 (eight) hours as needed.    ondansetron  (ZOFRAN-ODT) 4 MG disintegrating tablet Take 1-2 tablets (4-8 mg total) by mouth every 8 (eight) hours as needed for nausea.    traZODone (DESYREL) 100 MG tablet Take 100 mg by mouth at bedtime as needed for sleep. 01/19/2020: Patient reports she takes daily   venlafaxine XR (EFFEXOR-XR) 75 MG 24 hr capsule Take 75 mg by mouth at bedtime.    No facility-administered encounter medications on file as of 05/02/2020.     Goals Addressed              This Visit's Progress     "I need a CT scan of my kidneys" (pt-stated)        Current Barriers:   Financial constraints related to lack of insurance  to cover needed medical follow up  Clinical Social Work Clinical Goal(s):   Over the next 90 days, patient will work on completing a Personal assistant to cover needed medical follow up  Interventions:  Followed up with patient regarding medicaid application completion and submission.  Reviewed medicaid application and agreed to mail it in through the providers office  Confirmed that patient continues to participate actively in mental health counseling   Positive reinforcement provided for her participation in treatment to address her anxiety  Discussed plans with patient for ongoing care management follow up and provided patient with direct contact information for care management team   Patient Self Care Activities:   Patient verbalizes understanding of plan to completed Medicaid  application once received  Unable to perform IADLs independently  Knowledge deficit related to community resources to meet her medical needs  Please see past updates related to this goal by clicking on the "Past Updates" button in the selected goal          Follow Up Plan: SW will follow up with patient by phone over the next 7-14 business days    [place], [place] Clinical Social Worker  [facility] Care Management [phone]

## 2020-05-02 NOTE — Patient Instructions (Signed)
Thank you allowing the Chronic Care Management Team to be a part of your care! It was a pleasure speaking with you today!  1. Please continue to follow up with the [facility] for your mental health treatment 2. Please follow up with the Department of Social Services regarding the status of your medicaid application  CCM (Chronic Care Management) Team   [name] RN, BSN Nurse Care Coordinator  [phone]  Carolle Ishii [address], LCSW Clinical Social Worker [phone]  Goals Addressed              This Visit's Progress   .  "I need a CT scan of my kidneys" (pt-stated)        Current Barriers:  . Financial constraints related to lack of insurance  to cover needed medical follow up  Clinical Social Work Clinical Goal(s):  [name] Over the next 90 days, patient will work on completing a medicaid application to cover needed medical follow up  Interventions: . Followed up with patient regarding medicaid application completion and submission. . Reviewed medicaid application and agreed to mail it in through the providers office . Confirmed that patient continues to participate actively in mental health counseling  . Positive reinforcement provided for her participation in treatment to address her anxiety . Discussed plans with patient for ongoing care management follow up and provided patient with direct contact information for care management team   Patient Self Care Activities:  . Patient verbalizes understanding of plan to completed Medicaid application once received . Unable to perform IADLs independently . Knowledge deficit related to community resources to meet her medical needs  Please see past updates related to this goal by clicking on the "Past Updates" button in the selected goal          The patient verbalized understanding of instructions provided today and declined a print copy of patient instruction materials.   Telephone follow up appointment with care  management team member scheduled for: 05/16/20

## 2020-05-14 ENCOUNTER — Telehealth: Payer: Self-pay

## 2020-05-14 NOTE — Telephone Encounter (Signed)
Copied from CRM [id number]. Topic: General - Other >> May 14, 2020 11:29 AM Marylen Ponto wrote: Reason for CRM: Pt requests call back to schedule an appt for Covid testing.

## 2020-05-15 NOTE — Telephone Encounter (Signed)
LMTCB to ask why covid testing was needed. Need more info. Ok for [facility] to ask pt.

## 2020-05-16 ENCOUNTER — Ambulatory Visit: Payer: Self-pay

## 2020-05-16 ENCOUNTER — Ambulatory Visit: Payer: Self-pay | Admitting: *Deleted

## 2020-05-16 DIAGNOSIS — F411 Generalized anxiety disorder: Secondary | ICD-10-CM

## 2020-05-16 DIAGNOSIS — I701 Atherosclerosis of renal artery: Secondary | ICD-10-CM

## 2020-05-16 NOTE — Telephone Encounter (Signed)
Opened in Error.

## 2020-05-16 NOTE — Telephone Encounter (Signed)
Attempted to contact patient about requested COVID-19 test. Left VM for her to return call to office. We need more information about symptoms/exposure and concerns.

## 2020-05-16 NOTE — Patient Instructions (Addendum)
Thank you allowing the Chronic Care Management Team to be a part of your care! It was a pleasure speaking with you today!  1. Please be sure to submit the required documentation and submit to the Department of Social Services as soon as possible. 2. Please call this social worker with any questions or concerns  CCM (Chronic Care Management) Team   [name] RN, BSN Nurse Care Coordinator  [phone]  Maida Widger [address] LCSW Clinical Social Worker [phone]  Goals Addressed              This Visit's Progress   .  "I need a CT scan of my kidneys" (pt-stated)        Current Barriers:  . Financial constraints related to lack of insurance  to cover needed medical follow up  Clinical Social Work Clinical Goal(s):  [name] Over the next 90 days, patient will work on completing a medicaid application to cover needed medical follow up  Interventions: . Followed up with patient regarding medicaid application completion and submission. . Patient discussed being in quarantine due to possible COVID 19 exposure until 9/21 . Per patient, she has no symptoms at this time . Confirmed that patient has received correspondence form Medicaid that they have received  the medicaid application, have assigned her a Careers information officer and have provided her with an additional list of documents that she will needed to provide . Patient confirmed plan to have her spouse assist with the required paperwork . Positive reinforcement provided for her continued participation in mental health treatment and follow through with the medicaid application process . Discussed plans with patient for ongoing care management follow up and provided patient with direct contact information for care management team   Patient Self Care Activities:  . Patient verbalizes understanding of plan to completed Medicaid application once received . Unable to perform IADLs independently . Knowledge deficit related to community  resources to meet her medical needs  Please see past updates related to this goal by clicking on the "Past Updates" button in the selected goal          The patient verbalized understanding of instructions provided today and declined a print copy of patient instruction materials.   Telephone follow up appointment with care management team member scheduled for:  05/29/20

## 2020-05-16 NOTE — Telephone Encounter (Signed)
Pt called back no longer needs hepl/ Covid Test already sch.

## 2020-05-16 NOTE — Chronic Care Management (AMB) (Signed)
Care Management    Clinical Social Work Follow Up Note  05/16/2020 Name: Leah Bright MRN: [id number] DOB: May 21, 1991  Leah Bright is a 29 y.o. year old female who is a primary care patient of Flinchum, Eula Fried, FNP. The CCM team was consulted for assistance with [employer] .   Review of patient status, including review of consultants reports, other relevant assessments, and collaboration with appropriate care team members and the patient's provider was performed as part of comprehensive patient evaluation and provision of chronic care management services.    SDOH (Social Determinants of Health) assessments performed: No    Outpatient Encounter Medications as of 05/16/2020  Medication Sig Note  . albuterol (PROVENTIL HFA;VENTOLIN HFA) 108 (90 Base) MCG/ACT inhaler Inhale 2 puffs into the lungs every 6 (six) hours as needed for wheezing or shortness of breath.   [name] amLODipine (NORVASC) 10 MG tablet Take 1 tablet (10 mg total) by mouth daily.   . busPIRone (BUSPAR) 5 MG tablet Take 5 mg by mouth 2 (two) times daily.   . butalbital-acetaminophen-caffeine (FIORICET) 50-325-40 MG tablet Take 1 tablet by mouth every 6 (six) hours as needed for headache.   . levothyroxine (SYNTHROID) 75 MCG tablet Take 1 tablet (75 mcg total) by mouth daily before breakfast. Recheck lab around 06/23/20.   [name] lisinopril (ZESTRIL) 40 MG tablet Take 40 mg by mouth daily.   . metFORMIN (GLUCOPHAGE XR) 500 MG 24 hr tablet Take 4 tablets (2,000 mg total) by mouth daily.   . metoprolol succinate (TOPROL XL) 25 MG 24 hr tablet Take 1 tablet (25 mg total) by mouth daily. Check heart rate prior and do not take if heart rate is less than 60 beats per minute.   . norethindrone (MICRONOR) 0.35 MG tablet Take 1 tablet (0.35 mg total) by mouth daily.   . ondansetron (ZOFRAN ODT) 4 MG disintegrating tablet Take 1 tablet (4 mg total) by mouth every 8 (eight) hours as needed.   . ondansetron (ZOFRAN-ODT) 4 MG  disintegrating tablet Take 1-2 tablets (4-8 mg total) by mouth every 8 (eight) hours as needed for nausea.   . traZODone (DESYREL) 100 MG tablet Take 100 mg by mouth at bedtime as needed for sleep. 01/19/2020: Patient reports she takes daily  . venlafaxine XR (EFFEXOR-XR) 75 MG 24 hr capsule Take 75 mg by mouth at bedtime.    No facility-administered encounter medications on file as of 05/16/2020.     Goals Addressed              This Visit's Progress   .  "I need a CT scan of my kidneys" (pt-stated)        Current Barriers:  . Financial constraints related to lack of insurance  to cover needed medical follow up  Clinical Social Work Clinical Goal(s):  [name] Over the next 90 days, patient will work on completing a medicaid application to cover needed medical follow up  Interventions: . Followed up with patient regarding medicaid application completion and submission. . Patient discussed being in quarantine due to possible COVID 19 exposure until 9/21 . Per patient, she has no symptoms at this time . Confirmed that patient has received correspondence form Medicaid that they have received  the medicaid application, have assigned her a Careers information officer and have provided her with an additional list of documents that she will needed to provide . Patient confirmed plan to have her spouse assist with the required paperwork . Positive reinforcement provided  for her continued participation in mental health treatment and follow through with the medicaid application process . Discussed plans with patient for ongoing care management follow up and provided patient with direct contact information for care management team   Patient Self Care Activities:  . Patient verbalizes understanding of plan to completed Medicaid application once received . Unable to perform IADLs independently . Knowledge deficit related to community resources to meet her medical needs  Please see past updates related to this  goal by clicking on the "Past Updates" button in the selected goal          Follow Up Plan: SW will follow up with patient by phone over the next 7-14 business days    [place], [place] Clinical Social Worker  [facility] Care Management [phone]

## 2020-05-18 NOTE — Progress Notes (Signed)
Established patient visit   Patient: Leah Bright   DOB: 1991-05-23   29 y.o. Female  MRN: [id number] Visit Date: 05/21/2020  Today's healthcare provider: Jairo Ben, FNP   Chief Complaint  Patient presents with  . Diabetes  . Headache  . Hypertension   Subjective    HPI HPI    Headache    Location Descriptor In the frontal area.  Characterized as pressure and sharp pain.  Pain was noted as 5/10.  This started 1 month.  Occurs daily.  It is worse at random times.  Triggers include physical activity and change in position.  History of migraines.  Treatments Attempted: Excederin migraine.  Response to treatment was no improvement.  Associated symptoms include eye pain, pain with eye movement, nausea, dizziness, fatigue, motion sickness, imbalance, weight loss, fever, neck stiffness and rash.  Negative for blurred vision, vision loss, double vision, postnasal drip, photophobia, visual disturbance, numbness, weakness, abnormal gait, scalp tenderness, shoulder/hip pain, chills, neck pain, unequal pupils, trauma, transient vision loss, dim vision, redness, tearing, congestion, headache, vomiting, flashing lights, aura, abnormal speech, tingling, confusion, jaw claudication, pulsating sound, ice cream headaches and droopy lid.       Last edited by Fonda Kinder, CMA on 05/21/2020 10:47 AM. (History)      History of migraines, sees neurology, she denies any new or worsening symptoms. She needs follow up with neurology she reports she seen Dr Lucia Gaskins previously and therapy helping she was prescribed.  Diabetes Mellitus Type II, Follow-up  Lab Results  Component Value Date   HGBA1C 8.0 (A) 05/21/2020   HGBA1C 8.2 (H) 03/19/2020   HGBA1C 9.2 (A) 01/19/2020   Wt Readings from Last 3 Encounters:  05/21/20 (!) 310 lb 3.2 oz (140.7 kg)  04/16/20 (!) 309 lb 3.2 oz (140.3 kg)  02/23/20 (!) 305 lb (138.3 kg)   Last seen for diabetes 4 months ago.  Management since then  includes none. She reports good compliance with treatment. She is not having side effects. She reports she has had increased stress and diet has not been as good lately, she has been snacking on ice cream sandwiches at night.   She is now consistently taking her medications as prescribed she previously was not.  Symptoms: Yes fatigue No foot ulcerations  No appetite changes No nausea  No paresthesia of the feet  No polydipsia  No polyuria No visual disturbances   No vomiting     Home blood sugar records: not taking regularly as directed.   Episodes of hypoglycemia? No {   Current insulin regiment: none Most Recent Eye Exam: this year  Current exercise: walking Current diet habits: in general, an "unhealthy" diet but working on diet.   Pertinent Labs: Lab Results  Component Value Date   CHOL 206 (H) 07/19/2019   HDL 51 07/19/2019   LDLCALC 127 (H) 07/19/2019   TRIG 155 (H) 07/19/2019   CHOLHDL 4.0 07/19/2019   Lab Results  Component Value Date   NA 142 03/19/2020   K 4.2 03/19/2020   CREATININE 0.75 03/19/2020   GFRNONAA 107 03/19/2020   GFRAA 123 03/19/2020   GLUCOSE 152 (H) 03/19/2020     --------------------------------------------------------------------------------------------------- Hypertension, follow-up  BP Readings from Last 3 Encounters:  05/21/20 117/80  04/16/20 128/74  02/24/20 (!) 142/77   Wt Readings from Last 3 Encounters:  05/21/20 (!) 310 lb 3.2 oz (140.7 kg)  04/16/20 (!) 309 lb 3.2 oz (140.3 kg)  02/23/20 [name])  305 lb (138.3 kg)     She was last seen for hypertension 4 months ago.  BP at that visit was 122/70. Management since that visit includes none  She reports fair compliance with treatment. She is not having side effects. She is following a Regular diet. She sometimes  exercising. She does not smoke.  Use of agents associated with hypertension: none.   Outside blood pressures are not being checked. . Symptoms: No chest pain Yes  chest pressure  No palpitations No syncope  No dyspnea No orthopnea  No paroxysmal nocturnal dyspnea No lower extremity edema   Pertinent labs: Lab Results  Component Value Date   CHOL 206 (H) 07/19/2019   HDL 51 07/19/2019   LDLCALC 127 (H) 07/19/2019   TRIG 155 (H) 07/19/2019   CHOLHDL 4.0 07/19/2019   Lab Results  Component Value Date   NA 142 03/19/2020   K 4.2 03/19/2020   CREATININE 0.75 03/19/2020   GFRNONAA 107 03/19/2020   GFRAA 123 03/19/2020   GLUCOSE 152 (H) 03/19/2020     The ASCVD Risk score (Goff DC Jr., et al., 2013) failed to calculate for the following reasons:   The 2013 ASCVD risk score is only valid for ages 3040 to 2579   --------------------------------------------------------------------------------------------------- Hypothyroid, follow-up  Lab Results  Component Value Date   TSH 6.330 (H) 03/19/2020   TSH 10.500 (H) 07/19/2019   TSH 5.717 (H) 10/26/2018   T4TOTAL 7.6 07/19/2019   Wt Readings from Last 3 Encounters:  05/21/20 (!) 310 lb 3.2 oz (140.7 kg)  04/16/20 (!) 309 lb 3.2 oz (140.3 kg)  02/23/20 (!) 305 lb (138.3 kg)    She was last seen for hypothyroid 4 months ago.  Management since that visit includes none. She reports good compliance with treatment. She is not having side effects.   Symptoms: No change in energy level Yes constipation  No diarrhea No heat / cold intolerance  No nervousness No palpitations  No weight changes    ----------------------------------------------------------------------------------------- Patient Active Problem List   Diagnosis Date Noted  . Generalized anxiety disorder 05/21/2020  . Mood disorder (HCC) 01/19/2020  . Elevated liver enzymes 01/19/2020  . Hordeolum externum of left upper eyelid 10/13/2019  . Renal atrophy, right 09/20/2019  . History of diabetes mellitus, type II 09/20/2019  . Asthma, chronic 09/19/2019  . Migraine without aura and without status migrainosus, not intractable  09/18/2019  . Hypothyroidism 08/02/2018  . Essential hypertension 08/02/2018  . Morbid obesity (HCC) 08/02/2018  . Type 2 diabetes mellitus without complication, without long-term current use of insulin (HCC) 08/02/2018  . 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  . 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/02/2018  . Asthma exacerbation 09/28/2015  . CAP (community acquired pneumonia) 09/27/2015  . Hypokalemia 09/27/2015  . Hypomagnesemia 09/27/2015  . Hypoxia 09/27/2015  . Sepsis due to pneumonia (HCC) 09/27/2015  . Hypertension associated with diabetes (HCC) 09/27/2015  . Abdominal pain 01/22/2013  . Diarrhea 01/22/2013  . Rash 01/22/2013   Past Medical History:  Diagnosis Date  . Agitation   . Allergy   . Anxiety   . Asthma   . Chronic kidney disease   . Depression   . Diabetes mellitus without complication (HCC)   . High cholesterol   . Hypertension   . Hypothyroidism   . Left renal artery stenosis (HCC) 09/07/2019  . Migraine   . OCD (obsessive compulsive disorder)   . PTSD (post-traumatic stress disorder)   . Renal artery  stenosis (HCC)   . Right renal atrophy   . Social anxiety disorder   . Social anxiety disorder   . Social phobia   . Thyroid disease    Past Surgical History:  Procedure Laterality Date  . CHOLECYSTECTOMY  2008  . endoscopy/colonoscopy   2011  . LAPAROSCOPIC GASTRIC BANDING  2007  . LAPAROSCOPIC REPAIR AND REMOVAL OF GASTRIC BAND  2011  . TONSILLECTOMY  1994  . WISDOM TOOTH EXTRACTION         Medications: Outpatient Medications Prior to Visit  Medication Sig  . albuterol (PROVENTIL HFA;VENTOLIN HFA) 108 (90 Base) MCG/ACT inhaler Inhale 2 puffs into the lungs every 6 (six) hours as needed for wheezing or shortness of breath.  [name] amLODipine (NORVASC) 10 MG tablet Take 1 tablet (10 mg total) by mouth daily.  . busPIRone (BUSPAR) 5 MG tablet Take 5 mg by mouth 2 (two) times daily.  . butalbital-acetaminophen-caffeine (FIORICET)  50-325-40 MG tablet Take 1 tablet by mouth every 6 (six) hours as needed for headache.  . levothyroxine (SYNTHROID) 75 MCG tablet Take 1 tablet (75 mcg total) by mouth daily before breakfast. Recheck lab around 06/23/20.  [name] lisinopril (ZESTRIL) 40 MG tablet Take 40 mg by mouth daily.  . metFORMIN (GLUCOPHAGE XR) 500 MG 24 hr tablet Take 4 tablets (2,000 mg total) by mouth daily.  . norethindrone (MICRONOR) 0.35 MG tablet Take 1 tablet (0.35 mg total) by mouth daily.  . ondansetron (ZOFRAN-ODT) 4 MG disintegrating tablet Take 1-2 tablets (4-8 mg total) by mouth every 8 (eight) hours as needed for nausea.  . traZODone (DESYREL) 100 MG tablet Take 100 mg by mouth at bedtime as needed for sleep.  [name] venlafaxine XR (EFFEXOR-XR) 75 MG 24 hr capsule Take 75 mg by mouth at bedtime.  . [DISCONTINUED] HYDROcodone-acetaminophen (NORCO/VICODIN) 5-325 MG tablet TAKE 1 TABLET BY MOUTH ONCE DAILY FOR 5 DAYS  . [DISCONTINUED] metoprolol succinate (TOPROL XL) 25 MG 24 hr tablet Take 1 tablet (25 mg total) by mouth daily. Check heart rate prior and do not take if heart rate is less than 60 beats per minute.  . [DISCONTINUED] ondansetron (ZOFRAN ODT) 4 MG disintegrating tablet Take 1 tablet (4 mg total) by mouth every 8 (eight) hours as needed.   No facility-administered medications prior to visit.    Review of Systems  Constitutional: Negative.   HENT: Negative.   Respiratory: Negative.   Cardiovascular: Negative.   Gastrointestinal: Negative.   Genitourinary: Negative.   Musculoskeletal: Negative.   Skin: Negative.   Hematological: Negative.   Psychiatric/Behavioral: Negative.     Last CBC Lab Results  Component Value Date   WBC 6.9 03/19/2020   HGB 12.2 03/19/2020   HCT 37.4 03/19/2020   MCV 77 (L) 03/19/2020   MCH 25.2 (L) 03/19/2020   RDW 15.9 (H) 03/19/2020   PLT 319 03/19/2020   Last metabolic panel Lab Results  Component Value Date   GLUCOSE 152 (H) 03/19/2020   NA 142 03/19/2020   K  4.2 03/19/2020   CL 107 (H) 03/19/2020   CO2 19 (L) 03/19/2020   BUN 8 03/19/2020   CREATININE 0.75 03/19/2020   GFRNONAA 107 03/19/2020   GFRAA 123 03/19/2020   CALCIUM 9.1 03/19/2020   PHOS 3.0 07/19/2019   PROT 6.6 03/19/2020   ALBUMIN 4.1 03/19/2020   LABGLOB 2.5 03/19/2020   AGRATIO 1.6 03/19/2020   BILITOT 0.5 03/19/2020   ALKPHOS 88 03/19/2020   AST 22 03/19/2020   ALT 28  03/19/2020   ANIONGAP 13 02/23/2020   Last lipids Lab Results  Component Value Date   CHOL 206 (H) 07/19/2019   HDL 51 07/19/2019   LDLCALC 127 (H) 07/19/2019   TRIG 155 (H) 07/19/2019   CHOLHDL 4.0 07/19/2019   Last hemoglobin A1c Lab Results  Component Value Date   HGBA1C 8.0 (A) 05/21/2020   Last thyroid functions Lab Results  Component Value Date   TSH 6.330 (H) 03/19/2020   T4TOTAL 7.6 07/19/2019   Last vitamin D No results found for: 25OHVITD2, 25OHVITD3, VD25OH Last vitamin B12 and Folate No results found for: VITAMINB12, FOLATE    Objective    BP 117/80   Pulse 83   Temp 98.9 F (37.2 C) (Oral)   Resp 16   Wt (!) 310 lb 3.2 oz (140.7 kg)   SpO2 98%   BMI 51.62 kg/m  BP Readings from Last 3 Encounters:  05/21/20 117/80  04/16/20 128/74  02/24/20 (!) 142/77      Physical Exam Vitals reviewed.  Constitutional:      General: She is not in acute distress.    Appearance: She is obese. She is not ill-appearing, toxic-appearing or diaphoretic.  HENT:     Head: Normocephalic and atraumatic.     Jaw: There is normal jaw occlusion.     Mouth/Throat:     Mouth: Mucous membranes are moist.  Eyes:     General: Lids are normal. Vision grossly intact. Gaze aligned appropriately. No visual field deficit or scleral icterus.    Extraocular Movements: Extraocular movements intact.     Right eye: Normal extraocular motion.     Left eye: Normal extraocular motion.     Pupils: Pupils are equal, round, and reactive to light. Pupils are equal.  Neck:     Meningeal: Brudzinski's  sign and Kernig's sign absent.  Cardiovascular:     Rate and Rhythm: Normal rate and regular rhythm.     Heart sounds: Normal heart sounds. No murmur heard.  No friction rub. No gallop.   Pulmonary:     Effort: Pulmonary effort is normal. No respiratory distress.     Breath sounds: Normal breath sounds. No stridor. No wheezing, rhonchi or rales.  Chest:     Chest wall: No tenderness.  Abdominal:     General: Bowel sounds are normal. There is no distension.     Palpations: Abdomen is soft. There is no mass.     Tenderness: There is no abdominal tenderness. There is no guarding.  Musculoskeletal:        General: No swelling or tenderness. Normal range of motion.     Cervical back: Normal range of motion and neck supple. No rigidity.  Lymphadenopathy:     Cervical: No cervical adenopathy.  Skin:    General: Skin is warm and dry.     Capillary Refill: Capillary refill takes less than 2 seconds.  Neurological:     Mental Status: She is alert and oriented to person, place, and time.     GCS: GCS eye subscore is 4. GCS verbal subscore is 5. GCS motor subscore is 6.     Cranial Nerves: Cranial nerves are intact. No cranial nerve deficit, dysarthria or facial asymmetry.     Sensory: Sensation is intact. No sensory deficit.     Motor: Motor function is intact.     Coordination: Coordination is intact. Romberg sign negative. Coordination normal.     Gait: Gait is intact. Gait and tandem walk normal.  Deep Tendon Reflexes: Reflexes normal. Babinski sign absent on the right side. Babinski sign absent on the left side.     Reflex Scores:      Tricep reflexes are 2+ on the right side and 2+ on the left side.      Patellar reflexes are 2+ on the right side and 2+ on the left side. Psychiatric:        Attention and Perception: Attention and perception normal.        Mood and Affect: Mood normal.        Speech: Speech normal.        Behavior: Behavior normal. Behavior is cooperative.         Thought Content: Thought content normal.        Cognition and Memory: Cognition and memory normal.        Judgment: Judgment normal.      Results for orders placed or performed in visit on 05/21/20  POCT glycosylated hemoglobin (Hb A1C)  Result Value Ref Range   Hemoglobin A1C 8.0 (A) 4.0 - 5.6 %   HbA1c POC (<> result, manual entry)     HbA1c, POC (prediabetic range)     HbA1c, POC (controlled diabetic range)      Assessment & Plan     Type 2 diabetes mellitus without complication, without long-term current use of insulin (HCC) - Plan: POCT glycosylated hemoglobin (Hb A1C)  Hypothyroidism, unspecified type - Plan: TSH  Hypertension associated with diabetes (HCC) - Plan: CBC with Differential/Platelet, Comprehensive Metabolic Panel (CMET)  Mood disorder (HCC)  Generalized anxiety disorder  Morbid obesity (HCC), Chronic  A1C down from 8.2 to 8.  Been snacking on ice cream sandwiches, discussed healthy alternatives and diet for diabetes.  She will continue to work on diet and exercise. Continue Metformin as prescribed, diet and stress have been off the past two months, she would prefer to hold off on any additional diabetes medication at this time, she will work hard on diet and exercise/ weight loss and return in 3 months for recheck.   She is due for TSH and other labs around 06/20/20 and will come to office walk in to lab for those.   The patient is asked to make an attempt to improve diet and exercise patterns to aid in medical management of this problem.   She will schedule follow up with Dr. Lucia Gaskins  for history of migraines. RED flags of headaches discussed and when to seek emergency care.   Addressed acute and or chronic medical problems today requiring 45 minutes reviewing patients medical record,labs, counseling patient regarding patient's conditions, any medications, answering questions regarding health, and coordination of care as needed. After visit summary patient  given copy and reviewed.  Orders Placed This Encounter  Procedures  . CBC with Differential/Platelet  . Comprehensive Metabolic Panel (CMET)  . TSH  . POCT glycosylated hemoglobin (Hb A1C)     Return in about 3 months (around 08/20/2020), or if symptoms worsen or fail to improve, for at any time for any worsening symptoms, Go to Emergency room/ urgent care if worse.      IBeverely Pace Jumanah Hynson, FNP, have reviewed all documentation for this visit. The documentation on 05/21/20 for the exam, diagnosis, procedures, and orders are all accurate and complete.   Jairo Ben, FNP  [facility] [phone] (phone) [phone] (fax)  [facility]

## 2020-05-21 ENCOUNTER — Ambulatory Visit (INDEPENDENT_AMBULATORY_CARE_PROVIDER_SITE_OTHER): Payer: Self-pay | Admitting: Adult Health

## 2020-05-21 ENCOUNTER — Other Ambulatory Visit: Payer: Self-pay

## 2020-05-21 ENCOUNTER — Encounter: Payer: Self-pay | Admitting: Adult Health

## 2020-05-21 VITALS — BP 117/80 | HR 83 | Temp 98.9°F | Resp 16 | Wt 310.2 lb

## 2020-05-21 DIAGNOSIS — E1159 Type 2 diabetes mellitus with other circulatory complications: Secondary | ICD-10-CM

## 2020-05-21 DIAGNOSIS — F39 Unspecified mood [affective] disorder: Secondary | ICD-10-CM

## 2020-05-21 DIAGNOSIS — E119 Type 2 diabetes mellitus without complications: Secondary | ICD-10-CM

## 2020-05-21 DIAGNOSIS — F411 Generalized anxiety disorder: Secondary | ICD-10-CM | POA: Insufficient documentation

## 2020-05-21 DIAGNOSIS — I10 Essential (primary) hypertension: Secondary | ICD-10-CM

## 2020-05-21 DIAGNOSIS — I152 Hypertension secondary to endocrine disorders: Secondary | ICD-10-CM

## 2020-05-21 DIAGNOSIS — E039 Hypothyroidism, unspecified: Secondary | ICD-10-CM

## 2020-05-21 LAB — POCT GLYCOSYLATED HEMOGLOBIN (HGB A1C): Hemoglobin A1C: 8 % — AB (ref 4.0–5.6)

## 2020-05-21 MED ORDER — METOPROLOL SUCCINATE ER 25 MG PO TB24: 25.0000 mg | ORAL_TABLET | Freq: Every day | ORAL | 0 refills | Status: DC

## 2020-05-21 NOTE — Patient Instructions (Addendum)
Labs around 06/20/20 walk in for those.   Follow up in office in 3 months.   Diabetes Mellitus and Nutrition, Adult When you have diabetes (diabetes mellitus), it is very important to have healthy eating habits because your blood sugar (glucose) levels are greatly affected by what you eat and drink. Eating healthy foods in the appropriate amounts, at about the same times every day, can help you:  Control your blood glucose.  Lower your risk of heart disease.  Improve your blood pressure.  Reach or maintain a healthy weight. Every person with diabetes is different, and each person has different needs for a meal plan. Your health care provider may recommend that you work with a diet and nutrition specialist (dietitian) to make a meal plan that is best for you. Your meal plan may vary depending on factors such as:  The calories you need.  The medicines you take.  Your weight.  Your blood glucose, blood pressure, and cholesterol levels.  Your activity level.  Other health conditions you have, such as heart or kidney disease. How do carbohydrates affect me? Carbohydrates, also called carbs, affect your blood glucose level more than any other type of food. Eating carbs naturally raises the amount of glucose in your blood. Carb counting is a method for keeping track of how many carbs you eat. Counting carbs is important to keep your blood glucose at a healthy level, especially if you use insulin or take certain oral diabetes medicines. It is important to know how many carbs you can safely have in each meal. This is different for every person. Your dietitian can help you calculate how many carbs you should have at each meal and for each snack. Foods that contain carbs include:  Bread, cereal, rice, pasta, and crackers.  Potatoes and corn.  Peas, beans, and lentils.  Milk and yogurt.  Fruit and juice.  Desserts, such as cakes, cookies, ice cream, and candy. How does alcohol affect  me? Alcohol can cause a sudden decrease in blood glucose (hypoglycemia), especially if you use insulin or take certain oral diabetes medicines. Hypoglycemia can be a life-threatening condition. Symptoms of hypoglycemia (sleepiness, dizziness, and confusion) are similar to symptoms of having too much alcohol. If your health care provider says that alcohol is safe for you, follow these guidelines:  Limit alcohol intake to no more than 1 drink per day for nonpregnant women and 2 drinks per day for men. One drink equals 12 oz of beer, 5 oz of wine, or 1 oz of hard liquor.  Do not drink on an empty stomach.  Keep yourself hydrated with water, diet soda, or unsweetened iced tea.  Keep in mind that regular soda, juice, and other mixers may contain a lot of sugar and must be counted as carbs. What are tips for following this plan?  Reading food labels  Start by checking the serving size on the "Nutrition Facts" label of packaged foods and drinks. The amount of calories, carbs, fats, and other nutrients listed on the label is based on one serving of the item. Many items contain more than one serving per package.  Check the total grams (g) of carbs in one serving. You can calculate the number of servings of carbs in one serving by dividing the total carbs by 15. For example, if a food has 30 g of total carbs, it would be equal to 2 servings of carbs.  Check the number of grams (g) of saturated and trans fats in  one serving. Choose foods that have low or no amount of these fats.  Check the number of milligrams (mg) of salt (sodium) in one serving. Most people should limit total sodium intake to less than 2,300 mg per day.  Always check the nutrition information of foods labeled as "low-fat" or "nonfat". These foods may be higher in added sugar or refined carbs and should be avoided.  Talk to your dietitian to identify your daily goals for nutrients listed on the label. Shopping  Avoid buying  canned, premade, or processed foods. These foods tend to be high in fat, sodium, and added sugar.  Shop around the outside edge of the grocery store. This includes fresh fruits and vegetables, bulk grains, fresh meats, and fresh dairy. Cooking  Use low-heat cooking methods, such as baking, instead of high-heat cooking methods like deep frying.  Cook using healthy oils, such as olive, canola, or sunflower oil.  Avoid cooking with butter, cream, or high-fat meats. Meal planning  Eat meals and snacks regularly, preferably at the same times every day. Avoid going long periods of time without eating.  Eat foods high in fiber, such as fresh fruits, vegetables, beans, and whole grains. Talk to your dietitian about how many servings of carbs you can eat at each meal.  Eat 4-6 ounces (oz) of lean protein each day, such as lean meat, chicken, fish, eggs, or tofu. One oz of lean protein is equal to: ? 1 oz of meat, chicken, or fish. ? 1 egg. ?  cup of tofu.  Eat some foods each day that contain healthy fats, such as avocado, nuts, seeds, and fish. Lifestyle  Check your blood glucose regularly.  Exercise regularly as told by your health care provider. This may include: ? 150 minutes of moderate-intensity or vigorous-intensity exercise each week. This could be brisk walking, biking, or water aerobics. ? Stretching and doing strength exercises, such as yoga or weightlifting, at least 2 times a week.  Take medicines as told by your health care provider.  Do not use any products that contain nicotine or tobacco, such as cigarettes and e-cigarettes. If you need help quitting, ask your health care provider.  Work with a Veterinary surgeon or diabetes educator to identify strategies to manage stress and any emotional and social challenges. Questions to ask a health care provider  Do I need to meet with a diabetes educator?  Do I need to meet with a dietitian?  What number can I call if I have  questions?  When are the best times to check my blood glucose? Where to find more information:  American Diabetes Association: diabetes.org  Academy of Nutrition and Dietetics: www.eatright.[employer] of Diabetes and Digestive and Kidney Diseases (NIH): CarFli[employer].tn Summary  A healthy meal plan will help you control your blood glucose and maintain a healthy lifestyle.  Working with a diet and nutrition specialist (dietitian) can help you make a meal plan that is best for you.  Keep in mind that carbohydrates (carbs) and alcohol have immediate effects on your blood glucose levels. It is important to count carbs and to use alcohol carefully. This information is not intended to replace advice given to you by your health care provider. Make sure you discuss any questions you have with your health care provider. Document Revised: 07/31/2017 Document Reviewed: 09/22/2016 Elsevier Patient Education  2020 Elsevier Inc. Diabetes Mellitus and Exercise Exercising regularly is important for your overall health, especially when you have diabetes (diabetes mellitus). Exercising is  not only about losing weight. It has many other health benefits, such as increasing muscle strength and bone density and reducing body fat and stress. This leads to improved fitness, flexibility, and endurance, all of which result in better overall health. Exercise has additional benefits for people with diabetes, including:  Reducing appetite.  Helping to lower and control blood glucose.  Lowering blood pressure.  Helping to control amounts of fatty substances (lipids) in the blood, such as cholesterol and triglycerides.  Helping the body to respond better to insulin (improving insulin sensitivity).  Reducing how much insulin the body needs.  Decreasing the risk for heart disease by: ? Lowering cholesterol and triglyceride levels. ? Increasing the levels of good cholesterol. ? Lowering blood  glucose levels. What is my activity plan? Your health care provider or certified diabetes educator can help you make a plan for the type and frequency of exercise (activity plan) that works for you. Make sure that you:  Do at least 150 minutes of moderate-intensity or vigorous-intensity exercise each week. This could be brisk walking, biking, or water aerobics. ? Do stretching and strength exercises, such as yoga or weightlifting, at least 2 times a week. ? Spread out your activity over at least 3 days of the week.  Get some form of physical activity every day. ? Do not go more than 2 days in a row without some kind of physical activity. ? Avoid being inactive for more than 30 minutes at a time. Take frequent breaks to walk or stretch.  Choose a type of exercise or activity that you enjoy, and set realistic goals.  Start slowly, and gradually increase the intensity of your exercise over time. What do I need to know about managing my diabetes?   Check your blood glucose before and after exercising. ? If your blood glucose is 240 mg/dL (32.9 mmol/L) or higher before you exercise, check your urine for ketones. If you have ketones in your urine, do not exercise until your blood glucose returns to normal. ? If your blood glucose is 100 mg/dL (5.6 mmol/L) or lower, eat a snack containing 15-20 grams of carbohydrate. Check your blood glucose 15 minutes after the snack to make sure that your level is above 100 mg/dL (5.6 mmol/L) before you start your exercise.  Know the symptoms of low blood glucose (hypoglycemia) and how to treat it. Your risk for hypoglycemia increases during and after exercise. Common symptoms of hypoglycemia can include: ? Hunger. ? Anxiety. ? Sweating and feeling clammy. ? Confusion. ? Dizziness or feeling light-headed. ? Increased heart rate or palpitations. ? Blurry vision. ? Tingling or numbness around the mouth, lips, or tongue. ? Tremors or  shakes. ? Irritability.  Keep a rapid-acting carbohydrate snack available before, during, and after exercise to help prevent or treat hypoglycemia.  Avoid injecting insulin into areas of the body that are going to be exercised. For example, avoid injecting insulin into: ? The arms, when playing tennis. ? The legs, when jogging.  Keep records of your exercise habits. Doing this can help you and your health care provider adjust your diabetes management plan as needed. Write down: ? Food that you eat before and after you exercise. ? Blood glucose levels before and after you exercise. ? The type and amount of exercise you have done. ? When your insulin is expected to peak, if you use insulin. Avoid exercising at times when your insulin is peaking.  When you start a new exercise or activity, work  with your health care provider to make sure the activity is safe for you, and to adjust your insulin, medicines, or food intake as needed.  Drink plenty of water while you exercise to prevent dehydration or heat stroke. Drink enough fluid to keep your urine clear or pale yellow. Summary  Exercising regularly is important for your overall health, especially when you have diabetes (diabetes mellitus).  Exercising has many health benefits, such as increasing muscle strength and bone density and reducing body fat and stress.  Your health care provider or certified diabetes educator can help you make a plan for the type and frequency of exercise (activity plan) that works for you.  When you start a new exercise or activity, work with your health care provider to make sure the activity is safe for you, and to adjust your insulin, medicines, or food intake as needed. This information is not intended to replace advice given to you by your health care provider. Make sure you discuss any questions you have with your health care provider. Document Revised: 03/12/2017 Document Reviewed: 01/28/2016 Elsevier  Patient Education  2020 Elsevier Inc. Hemoglobin A1c Test Why am I having this test? You may have the hemoglobin A1c test (HbA1c test) done to:  Evaluate your risk for developing diabetes (diabetes mellitus).  Diagnose diabetes.  Monitor long-term control of blood sugar (glucose) in people who have diabetes and help make treatment decisions. This test may be done with other blood glucose tests, such as fasting blood glucose and oral glucose tolerance tests. What is being tested? Hemoglobin is a type of protein in the blood that carries oxygen. Glucose attaches to hemoglobin to form glycated hemoglobin. This test checks the amount of glycated hemoglobin in your blood, which is a good indicator of the average amount of glucose in your blood during the past 2-3 months. What kind of sample is taken?  A blood sample is required for this test. It is usually collected by inserting a needle into a blood vessel. Tell a health care provider about:  All medicines you are taking, including vitamins, herbs, eye drops, creams, and over-the-counter medicines.  Any blood disorders you have.  Any surgeries you have had.  Any medical conditions you have.  Whether you are pregnant or may be pregnant. How are the results reported? Your results will be reported as a percentage that indicates how much of your hemoglobin has glucose attached to it (is glycated). Your health care provider will compare your results to normal ranges that were established after testing a large group of people (reference ranges). Reference ranges may vary among labs and hospitals. For this test, common reference ranges are:  Adult or child without diabetes: 4-5.6%.  Adult or child with diabetes and good blood glucose control: less than 7%. What do the results mean? If you have diabetes:  A result of less than 7% is considered normal, meaning that your blood glucose is well controlled.  A result higher than 7% means that  your blood glucose is not well controlled, and your treatment plan may need to be adjusted. If you do not have diabetes:  A result within the reference range is considered normal, meaning that you are not at high risk for diabetes.  A result of 5.7-6.4% means that you have a high risk of developing diabetes, and you may have prediabetes. Prediabetes is the condition of having a blood glucose level that is higher than it should be, but not high enough for you to  be diagnosed with diabetes. Having prediabetes puts you at risk for developing type 2 diabetes (type 2 diabetes mellitus). You may have more tests, including a repeat HbA1c test.  Results of 6.5% or higher on two separate HbA1c tests mean that you have diabetes. You may have more tests to confirm the diagnosis. Abnormally low HbA1c values may be caused by:  Pregnancy.  Severe blood loss.  Receiving donated blood (transfusions).  Low red blood cell count (anemia).  Long-term kidney failure.  Some unusual forms (variants) of hemoglobin. Talk with your health care provider about what your results mean. Questions to ask your health care provider Ask your health care provider, or the department that is doing the test:  When will my results be ready?  How will I get my results?  What are my treatment options?  What other tests do I need?  What are my next steps? Summary  The hemoglobin A1c test (HbA1c test) may be done to evaluate your risk for developing diabetes, to diagnose diabetes, and to monitor long-term control of blood sugar (glucose) in people who have diabetes and help make treatment decisions.  Hemoglobin is a type of protein in the blood that carries oxygen. Glucose attaches to hemoglobin to form glycated hemoglobin. This test checks the amount of glycated hemoglobin in your blood, which is a good indicator of the average amount of glucose in your blood during the past 2-3 months.  Talk with your health care  provider about what your results mean. This information is not intended to replace advice given to you by your health care provider. Make sure you discuss any questions you have with your health care provider. Document Revised: 07/31/2017 Document Reviewed: 03/31/2017 Elsevier Patient Education  2020 [employer]

## 2020-05-22 ENCOUNTER — Emergency Department: Payer: Self-pay

## 2020-05-22 ENCOUNTER — Emergency Department
Admission: EM | Admit: 2020-05-22 | Discharge: 2020-05-22 | Disposition: A | Discharge status: Home / Self Care | Payer: Self-pay | Attending: Emergency Medicine | Admitting: Emergency Medicine

## 2020-05-22 ENCOUNTER — Encounter: Payer: Self-pay | Admitting: Emergency Medicine

## 2020-05-22 ENCOUNTER — Ambulatory Visit: Payer: Self-pay | Admitting: Adult Health

## 2020-05-22 ENCOUNTER — Other Ambulatory Visit: Payer: Self-pay

## 2020-05-22 DIAGNOSIS — J45909 Unspecified asthma, uncomplicated: Secondary | ICD-10-CM | POA: Insufficient documentation

## 2020-05-22 DIAGNOSIS — J4521 Mild intermittent asthma with (acute) exacerbation: Secondary | ICD-10-CM | POA: Insufficient documentation

## 2020-05-22 DIAGNOSIS — E119 Type 2 diabetes mellitus without complications: Secondary | ICD-10-CM | POA: Insufficient documentation

## 2020-05-22 DIAGNOSIS — F410 Panic disorder [episodic paroxysmal anxiety] without agoraphobia: Secondary | ICD-10-CM | POA: Insufficient documentation

## 2020-05-22 DIAGNOSIS — H6122 Impacted cerumen, left ear: Secondary | ICD-10-CM | POA: Insufficient documentation

## 2020-05-22 DIAGNOSIS — E039 Hypothyroidism, unspecified: Secondary | ICD-10-CM | POA: Insufficient documentation

## 2020-05-22 DIAGNOSIS — Z79899 Other long term (current) drug therapy: Secondary | ICD-10-CM | POA: Insufficient documentation

## 2020-05-22 DIAGNOSIS — Z79890 Hormone replacement therapy (postmenopausal): Secondary | ICD-10-CM | POA: Insufficient documentation

## 2020-05-22 DIAGNOSIS — R0789 Other chest pain: Secondary | ICD-10-CM

## 2020-05-22 LAB — CBC
HCT: 35.8 % — ABNORMAL LOW (ref 36.0–46.0)
Hemoglobin: 11.9 g/dL — ABNORMAL LOW (ref 12.0–15.0)
MCH: 26.2 pg (ref 26.0–34.0)
MCHC: 33.2 g/dL (ref 30.0–36.0)
MCV: 78.7 fL — ABNORMAL LOW (ref 80.0–100.0)
Platelets: 310 10*3/uL (ref 150–400)
RBC: 4.55 MIL/uL (ref 3.87–5.11)
RDW: 14.6 % (ref 11.5–15.5)
WBC: 7.6 10*3/uL (ref 4.0–10.5)
nRBC: 0 % (ref 0.0–0.2)

## 2020-05-22 LAB — BASIC METABOLIC PANEL
Anion gap: 14 (ref 5–15)
BUN: 12 mg/dL (ref 6–20)
CO2: 21 mmol/L — ABNORMAL LOW (ref 22–32)
Calcium: 8.9 mg/dL (ref 8.9–10.3)
Chloride: 104 mmol/L (ref 98–111)
Creatinine, Ser: 0.72 mg/dL (ref 0.44–1.00)
GFR calc Af Amer: >60 mL/min (ref >60)
GFR calc non Af Amer: >60 mL/min (ref >60)
Glucose, Bld: 254 mg/dL — ABNORMAL HIGH (ref 70–99)
Potassium: 3.8 mmol/L (ref 3.5–5.1)
Sodium: 139 mmol/L (ref 135–145)

## 2020-05-22 LAB — TROPONIN I (HIGH SENSITIVITY): Troponin I (High Sensitivity): 3 ng/L (ref <18)

## 2020-05-22 MED ORDER — PREDNISONE 50 MG PO TABS: 50.0000 mg | ORAL_TABLET | Freq: Every day | ORAL | 0 refills | Status: DC

## 2020-05-22 MED ORDER — MELOXICAM 7.5 MG PO TABS
15.0000 mg | ORAL_TABLET | Freq: Once | ORAL | Status: AC
  Administered 2020-05-22: 15 mg via ORAL
  Filled 2020-05-22: qty 2

## 2020-05-22 MED ORDER — PREDNISONE 20 MG PO TABS
60.0000 mg | ORAL_TABLET | Freq: Once | ORAL | Status: AC
  Administered 2020-05-22: 60 mg via ORAL
  Filled 2020-05-22: qty 3

## 2020-05-22 MED ORDER — CARBAMIDE PEROXIDE 6.5 % OT SOLN: 5.0000 [drp] | Freq: Two times a day (BID) | OTIC | 2 refills | Status: DC

## 2020-05-22 MED ORDER — MELOXICAM 15 MG PO TABS: 15.0000 mg | ORAL_TABLET | Freq: Every day | ORAL | 0 refills | Status: DC

## 2020-05-22 NOTE — Telephone Encounter (Signed)
Pt reports chest pain, onset yesterday afternoon, "After I left my appt." saw M. Flinchun FNP yesterday. States "Had a panic attack in [place] and pain started then. Reports left sided CP, at breast area. Radiates "A little" to back. Dull pain constant worsens with deep breath, 8/10. SOB with exertion. Also reports headache, left ear pain and "Top left side of my head is sensitive." No rash noted.  Speech is non halting during call. States pain on inspiration "Like a sharp knife going in." Advised ED, pt states will follow disposition, will have friend drive. Care advise given, verbalizes understanding. Assured NT would route to practice for PCPs review.   Reason for Disposition . Taking a deep breath makes pain worse  Answer Assessment - Initial Assessment Questions 1. LOCATION: "Where does it hurt?"       Left breast area 2. RADIATION: "Does the pain go anywhere else?" (e.g., into neck, jaw, arms, back)     "Little bit into back, left side." 3. ONSET: "When did the chest pain begin?" (Minutes, hours or days)      Last night after a panic attack 4. PATTERN "Does the pain come and go, or has it been constant since it started?"  "Does it get worse with exertion?"      Constant, worse when taking a deep breath 5. DURATION: "How long does it last" (e.g., seconds, minutes, hours)     constant 6. SEVERITY: "How bad is the pain?"  (e.g., Scale 1-10; mild, moderate, or severe)    - MILD (1-3): doesn't interfere with normal activities     - MODERATE (4-7): interferes with normal activities or awakens from sleep    - SEVERE (8-10): excruciating pain, unable to do any normal activities       "Like a sharp knife." 8/10 with deep breath, dull when not.   7. CARDIAC RISK FACTORS: "Do you have any history of heart problems or risk factors for heart disease?" (e.g., angina, prior heart attack; diabetes, high blood pressure, high cholesterol, smoker, or strong family history of heart disease)      8.  PULMONARY RISK FACTORS: "Do you have any history of lung disease?"  (e.g., blood clots in lung, asthma, emphysema, birth control pills)     9. CAUSE: "What do you think is causing the chest pain?"      10. OTHER SYMPTOMS: "Do you have any other symptoms?" (e.g., dizziness, nausea, vomiting, sweating, fever, difficulty breathing, cough)      Left sided headache, left ear pain  "From ear forward." Top of head "Sensitive, tender to touch."  Protocols used: CHEST PAIN-A-AH

## 2020-05-22 NOTE — ED Notes (Signed)
See triage note  Presents with pain to left side of chest since yesterday   Pt has had dry cough  No fever  Also has had left ear pain

## 2020-05-22 NOTE — ED Triage Notes (Signed)
Patient to ER for c/o chest pain that began yesterday. Patient states she had panic attack at [facility] yesterday at approx 1430, developed chest pain immediately after. Patient reports pain to left side of head and left ear as well.

## 2020-05-22 NOTE — ED Provider Notes (Addendum)
[facility] Emergency Department Provider Note  ____________________________________________  Time seen: Approximately 6:35 PM  I have reviewed the triage vital signs and the nursing notes.   HISTORY  Chief Complaint Chest Pain    HPI Leah Bright is a 29 y.o. female who presents the emergency department complaining of left-sided chest pain, left ear pain x2 days.  Patient states that she had seen her primary care yesterday for routine visit.  Patient states that after she left their office, she went to the grocery store and had a panic attack.  Patient states that she felt short of breath, had chest pain at that time but went home to take a nap.  Patient awoke and was experiencing some shortness of breath.  She does have a history of asthma and her significant other advised her to use albuterol treatment.  This improved her shortness of breath but she continued to have left-sided chest pain.  She states that it is a sharp sensation, reproducible with pain.  Reproducible with movement.  Reproducible with a deep breath.  No substernal pain.  Patient also is complaining of left ear pain that began this morning.  No fevers or chills, nasal congestion, sore throat.  Patient does have a cough beginning last night after her panic attack.  Notes dry and nonproductive.  No abdominal pain, GI or urinary complaints.  Patient states that she just had a negative Covid test and has had no sick contacts.         Past Medical History:  Diagnosis Date  . Agitation   . Allergy   . Anxiety   . Asthma   . Chronic kidney disease   . Depression   . Diabetes mellitus without complication (HCC)   . High cholesterol   . Hypertension   . Hypothyroidism   . Left renal artery stenosis (HCC) 09/07/2019  . Migraine   . OCD (obsessive compulsive disorder)   . PTSD (post-traumatic stress disorder)   . Renal artery stenosis (HCC)   . Right renal atrophy   . Social anxiety disorder    . Social anxiety disorder   . Social phobia   . Thyroid disease     Patient Active Problem List   Diagnosis Date Noted  . Generalized anxiety disorder 05/21/2020  . Mood disorder (HCC) 01/19/2020  . Elevated liver enzymes 01/19/2020  . Hordeolum externum of left upper eyelid 10/13/2019  . Renal atrophy, right 09/20/2019  . History of diabetes mellitus, type II 09/20/2019  . Asthma, chronic 09/19/2019  . Migraine without aura and without status migrainosus, not intractable 09/18/2019  . Hypothyroidism 08/02/2018  . Essential hypertension 08/02/2018  . Morbid obesity (HCC) 08/02/2018  . Type 2 diabetes mellitus without complication, without long-term current use of insulin (HCC) 08/02/2018  . 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  . 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/02/2018  . Asthma exacerbation 09/28/2015  . CAP (community acquired pneumonia) 09/27/2015  . Hypokalemia 09/27/2015  . Hypomagnesemia 09/27/2015  . Hypoxia 09/27/2015  . Sepsis due to pneumonia (HCC) 09/27/2015  . Hypertension associated with diabetes (HCC) 09/27/2015  . Abdominal pain 01/22/2013  . Diarrhea 01/22/2013  . Rash 01/22/2013    Past Surgical History:  Procedure Laterality Date  . CHOLECYSTECTOMY  2008  . endoscopy/colonoscopy   2011  . LAPAROSCOPIC GASTRIC BANDING  2007  . LAPAROSCOPIC REPAIR AND REMOVAL OF GASTRIC BAND  2011  . TONSILLECTOMY  1994  . WISDOM TOOTH EXTRACTION  Prior to Admission medications   Medication Sig Start Date End Date Taking? Authorizing Provider  albuterol (PROVENTIL HFA;VENTOLIN HFA) 108 (90 Base) MCG/ACT inhaler Inhale 2 puffs into the lungs every 6 (six) hours as needed for wheezing or shortness of breath. 11/02/18   Jacquelin Hawking, PA-C  amLODipine (NORVASC) 10 MG tablet Take 1 tablet (10 mg total) by mouth daily. 02/27/20   Flinchum, Eula Fried, FNP  busPIRone (BUSPAR) 5 MG tablet Take 5 mg by mouth 2 (two) times daily.    [provider]  butalbital-acetaminophen-caffeine (FIORICET) 50-325-40 MG tablet Take 1 tablet by mouth every 6 (six) hours as needed for headache. 09/15/19   Anson Fret, MD  carbamide peroxide (DEBROX) 6.5 % OTIC solution Place 5 drops into the left ear 2 (two) times daily. 05/22/20 05/22/21  Taelyn Broecker, Delorise Royals, PA-C  levothyroxine (SYNTHROID) 75 MCG tablet Take 1 tablet (75 mcg total) by mouth daily before breakfast. Recheck lab around 06/23/20. 03/22/20   Flinchum, Eula Fried, FNP  lisinopril (ZESTRIL) 40 MG tablet Take 40 mg by mouth daily.    [provider]  meloxicam (MOBIC) 15 MG tablet Take 1 tablet (15 mg total) by mouth daily. 05/22/20   Hawken Bielby, Delorise Royals, PA-C  metFORMIN (GLUCOPHAGE XR) 500 MG 24 hr tablet Take 4 tablets (2,000 mg total) by mouth daily. 04/27/20   Flinchum, Eula Fried, FNP  metoprolol succinate (TOPROL XL) 25 MG 24 hr tablet Take 1 tablet (25 mg total) by mouth daily. Check heart rate prior and do not take if heart rate is less than 60 beats per minute. 05/21/20   Flinchum, Eula Fried, FNP  norethindrone (MICRONOR) 0.35 MG tablet Take 1 tablet (0.35 mg total) by mouth daily. 07/27/19   Schuman, Christanna R, MD  ondansetron (ZOFRAN-ODT) 4 MG disintegrating tablet Take 1-2 tablets (4-8 mg total) by mouth every 8 (eight) hours as needed for nausea. 09/15/19   Anson Fret, MD  predniSONE (DELTASONE) 50 MG tablet Take 1 tablet (50 mg total) by mouth daily with breakfast. 05/22/20   Devetta Hagenow, Delorise Royals, PA-C  traZODone (DESYREL) 100 MG tablet Take 100 mg by mouth at bedtime as needed for sleep.    [provider]  venlafaxine XR (EFFEXOR-XR) 75 MG 24 hr capsule Take 75 mg by mouth at bedtime.    [provider]    Allergies Toradol [ketorolac tromethamine], Codeine, Other, Pepto-bismol [bismuth], and Tramadol  Family History  Problem Relation Age of Onset  . Cancer Mother        brain tumor  . Hypertension Mother   . Depression  Mother   . Diabetes Mother   . Migraines Mother        benign tumor, still gets headaches sometimes now   . Anxiety disorder Father   . Depression Father   . Hypertension Maternal Grandmother   . Thyroid disease Maternal Grandmother   . Diabetes Maternal Grandmother   . Hypertension Maternal Grandfather   . Cancer Maternal Grandfather        myositis  . High blood pressure Maternal Grandfather   . Diabetes Maternal Grandfather   . Stroke Maternal Grandfather     Social History Social History   Tobacco Use  . Smoking status: Never Smoker  . Smokeless tobacco: Never Used  Vaping Use  . Vaping Use: Never used  Substance Use Topics  . Alcohol use: Yes    Comment: rarely, maybe 1 shot every 2-3 months  . Drug use: Never  Review of Systems  Constitutional: No fever/chills Eyes: No visual changes. No discharge ENT: No upper respiratory complaints. Cardiovascular: Left-sided chest pain. Respiratory: Nonproductive cough.  Positive for shortness of breath after a panic attack yesterday Gastrointestinal: No abdominal pain.  No nausea, no vomiting.  No diarrhea.  No constipation. Genitourinary: Negative for dysuria. No hematuria Musculoskeletal: Negative for musculoskeletal pain. Skin: Negative for rash, abrasions, lacerations, ecchymosis. Neurological: Negative for headaches, focal weakness or numbness. Psychological: Positive for panic attack yesterday.  No suicidal or homicidal ideations 10-point ROS otherwise negative.  ____________________________________________   PHYSICAL EXAM:  VITAL SIGNS: ED Triage Vitals  Enc Vitals Group     BP 05/22/20 1626 138/72     Pulse Rate 05/22/20 1626 98     Resp 05/22/20 1626 20     Temp 05/22/20 1626 98.2 F (36.8 C)     Temp Source 05/22/20 1626 Oral     SpO2 05/22/20 1626 98 %     Weight 05/22/20 1628 (!) 310 lb 3 oz (140.7 kg)     Height 05/22/20 1628 5\' 5"  (1.651 m)     Head Circumference --      Peak Flow --       Pain Score 05/22/20 1627 8     Pain Loc --      Pain Edu? --      Excl. in GC? --      Constitutional: Alert and oriented. Well appearing and in no acute distress. Eyes: Conjunctivae are normal. PERRL. EOMI. Head: Atraumatic. ENT:      Ears: EAC on left has significant amount of cerumen.  TMs not visualized.  EAC with no erythema or edema.  No tenderness to palpation of the tragus.      Nose: No congestion/rhinnorhea.      Mouth/Throat: Mucous membranes are moist.  Neck: No stridor.  No cervical spine tenderness to palpation. Hematological/Lymphatic/Immunilogical: No cervical lymphadenopathy. Cardiovascular: Normal rate, regular rhythm. Normal S1 and S2.  No murmurs, rubs, gallops.  No muffled heart sounds.  Good peripheral circulation. Respiratory: Normal respiratory effort without tachypnea or retractions. Lungs CTAB. Good air entry to the bases with no decreased or absent breath sounds. Gastrointestinal: Bowel sounds 4 quadrants. Soft and nontender to palpation. No guarding or rigidity. No palpable masses. No distention. No CVA tenderness. Musculoskeletal: Full range of motion to all extremities. No gross deformities appreciated.  Visualization of the left anterior chest wall reveals no visible abnormality.  Patient is tender to palpation in the intercostal margins between ribs 9, 10 and 11.  This extends from the anterior aspect of the chest to the lateral aspect of the chest.  Palpation of this area reproduces patient's symptoms.  With movement, symptoms are also reproduced.  No crepitus.  No subcutaneous emphysema.  Good underlying breath sounds bilaterally. Neurologic:  Normal speech and language. No gross focal neurologic deficits are appreciated.  Skin:  Skin is warm, dry and intact. No rash noted. Psychiatric: Mood and affect are normal. Speech and behavior are normal. Patient exhibits appropriate insight and judgement.  Patient endorses a panic attack yesterday.  No residual anxiety  or panic symptoms.  No suicidal or homicidal ideations.   ____________________________________________   LABS (all labs ordered are listed, but only abnormal results are displayed)  Labs Reviewed  BASIC METABOLIC PANEL - Abnormal; Notable for the following components:      Result Value   CO2 21 (*)    Glucose, Bld 254 (*)    All other  components within normal limits  CBC - Abnormal; Notable for the following components:   Hemoglobin 11.9 (*)    HCT 35.8 (*)    MCV 78.7 (*)    All other components within normal limits  POC URINE PREG, ED  TROPONIN I (HIGH SENSITIVITY)  TROPONIN I (HIGH SENSITIVITY)   ____________________________________________  EKG   ____________________________________________  RADIOLOGY I personally viewed and evaluated these images as part of my medical decision making, as well as reviewing the written report by the radiologist.  DG Chest 2 View  Result Date: 05/22/2020 CLINICAL DATA:  Left-sided chest pain since yesterday, panic attack EXAM: CHEST - 2 VIEW COMPARISON:  10/16/2017 FINDINGS: The heart size and mediastinal contours are within normal limits. Both lungs are clear. The visualized skeletal structures are unremarkable. IMPRESSION: No active cardiopulmonary disease. Electronically Signed   By: Sharlet Salina M.D.   On: 05/22/2020 16:53    ____________________________________________    PROCEDURES  Procedure(s) performed:    Procedures    Medications  meloxicam (MOBIC) tablet 15 mg (has no administration in time range)  predniSONE (DELTASONE) tablet 60 mg (has no administration in time range)       Pulmonary Embolism Rule-out Criteria (PERC rule)                        If YES to ANY of the following, the PERC rule is not satisfied and cannot be used to rule out PE in this patient (consider d-dimer or imaging depending on pre-test probability).                      If NO to ALL of the following, AND the clinician's pre-test  probability is <15%, the [facility] rule is satisfied and there is no need for further workup (including no need to obtain a d-dimer) as the post-test probability of pulmonary embolism is <2%.                      Mnemonic is HAD CLOTS   H - hormone use (exogenous estrogen)      No. A - age > 50                                                 No. D - DVT/PE history                                      No.   C - coughing blood (hemoptysis)                 No. L - leg swelling, unilateral                             No. O - O2 Sat on Room Air < 95%                  No. T - tachycardia (HR ? 100)                         No. S - surgery or trauma, recent  No.   Based on my evaluation of the patient, including application of this decision instrument, further testing to evaluate for pulmonary embolism is not indicated at this time. I have discussed this recommendation with the patient who states understanding and agreement with this plan.   ____________________________________________   INITIAL IMPRESSION / ASSESSMENT AND PLAN / ED COURSE  Pertinent labs & imaging results that were available during my care of the patient were reviewed by me and considered in my medical decision making (see chart for details).  Review of the [place] CSRS was performed in accordance of the [facility] prior to dispensing any controlled drugs.           Patient's diagnosis is consistent with panic attack, chest wall pain, cerumen impaction, asthma exacerbation.  Patient presented to emergency department complaining of left-sided chest pain, left ear pain.  Patient had was asymptomatic yesterday morning.  She saw her primary care yesterday with no acute findings.  Patient had a panic attack yesterday and developed her symptoms secondary to her panic attack.  Differential included ACS/STEMI, PE, pneumothorax, bronchitis,, pneumonia, COVID-19, costochondritis, otitis media, otitis externa, URI, cerumen  impaction.  Patient with findings consistent with cerumen impaction for left ear pain.  Patient has had increased asthma symptoms since her panic attack yesterday.  No acute distress at this time.  O2 saturation is stable.  No findings on chest x-ray.  Patient pain was reproducible with movement and palpation along the chest wall.  Troponin, EKG is reassuring.  At this time patient will treated with anti-inflammatory for chest wall pain, short course of steroid for asthma exacerbation, Debrox for cerumen impaction.  Follow-up primary care as needed..  Patient is given ED precautions to return to the ED for any worsening or new symptoms.     ____________________________________________  FINAL CLINICAL IMPRESSION(S) / ED DIAGNOSES  Final diagnoses:  Chest wall pain  Mild intermittent asthma with acute exacerbation  Impacted cerumen of left ear  Panic attack      NEW MEDICATIONS STARTED DURING THIS VISIT:  ED Discharge Orders         Ordered    predniSONE (DELTASONE) 50 MG tablet  Daily with breakfast        05/22/20 1937    meloxicam (MOBIC) 15 MG tablet  Daily        05/22/20 1937    carbamide peroxide (DEBROX) 6.5 % OTIC solution  2 times daily        05/22/20 1937              This chart was dictated using voice recognition software/Dragon. Despite best efforts to proofread, errors can occur which can change the meaning. Any change was purely unintentional.    Racheal Patches, PA-C 05/22/20 1937    Reda Gettis, Delorise Royals, PA-C 05/22/20 Vevelyn Francois, MD 05/22/20 2329

## 2020-05-23 ENCOUNTER — Ambulatory Visit: Payer: Self-pay | Admitting: *Deleted

## 2020-05-23 DIAGNOSIS — N261 Atrophy of kidney (terminal): Secondary | ICD-10-CM

## 2020-05-23 DIAGNOSIS — F331 Major depressive disorder, recurrent, moderate: Secondary | ICD-10-CM

## 2020-05-23 NOTE — Chronic Care Management (AMB) (Signed)
Care Management    Clinical Social Work Follow Up Note  05/23/2020 Name: Leah Bright MRN: [id number] DOB: [birth year]  Leah Bright is a 29 y.o. year old female who is a primary care patient of Flinchum, Eula Fried, FNP. The CCM team was consulted for assistance with [facility] .   Review of patient status, including review of consultants reports, other relevant assessments, and collaboration with appropriate care team members and the patient's provider was performed as part of comprehensive patient evaluation and provision of chronic care management services.    SDOH (Social Determinants of Health) assessments performed: No    Outpatient Encounter Medications as of 05/23/2020  Medication Sig Note  . albuterol (PROVENTIL HFA;VENTOLIN HFA) 108 (90 Base) MCG/ACT inhaler Inhale 2 puffs into the lungs every 6 (six) hours as needed for wheezing or shortness of breath.   [name] amLODipine (NORVASC) 10 MG tablet Take 1 tablet (10 mg total) by mouth daily.   . busPIRone (BUSPAR) 5 MG tablet Take 5 mg by mouth 2 (two) times daily.   . butalbital-acetaminophen-caffeine (FIORICET) 50-325-40 MG tablet Take 1 tablet by mouth every 6 (six) hours as needed for headache.   . carbamide peroxide (DEBROX) 6.5 % OTIC solution Place 5 drops into the left ear 2 (two) times daily.   [name] levothyroxine (SYNTHROID) 75 MCG tablet Take 1 tablet (75 mcg total) by mouth daily before breakfast. Recheck lab around 06/23/20.   [name] lisinopril (ZESTRIL) 40 MG tablet Take 40 mg by mouth daily.   . meloxicam (MOBIC) 15 MG tablet Take 1 tablet (15 mg total) by mouth daily.   . metFORMIN (GLUCOPHAGE XR) 500 MG 24 hr tablet Take 4 tablets (2,000 mg total) by mouth daily.   . metoprolol succinate (TOPROL XL) 25 MG 24 hr tablet Take 1 tablet (25 mg total) by mouth daily. Check heart rate prior and do not take if heart rate is less than 60 beats per minute.   . norethindrone (MICRONOR) 0.35 MG tablet Take 1 tablet (0.35 mg  total) by mouth daily.   . ondansetron (ZOFRAN-ODT) 4 MG disintegrating tablet Take 1-2 tablets (4-8 mg total) by mouth every 8 (eight) hours as needed for nausea.   . predniSONE (DELTASONE) 50 MG tablet Take 1 tablet (50 mg total) by mouth daily with breakfast.   . traZODone (DESYREL) 100 MG tablet Take 100 mg by mouth at bedtime as needed for sleep. 01/19/2020: Patient reports she takes daily  . venlafaxine XR (EFFEXOR-XR) 75 MG 24 hr capsule Take 75 mg by mouth at bedtime.    No facility-administered encounter medications on file as of 05/23/2020.     Goals Addressed              This Visit's Progress   .  "I need a CT scan of my kidneys" (pt-stated)        Current Barriers:  . Financial constraints related to lack of insurance  to cover needed medical follow up  Clinical Social Work Clinical Goal(s):  [name] Over the next 90 days, patient will work on completing a medicaid application to cover needed medical follow up  Interventions: . Followed up with patient regarding medicaid application completion and submission. . Patient states that she has returned all required documents excluding proof of residence . This Child psychotherapist explored alternatives, including providing a hospital bill as proof, however recommended that she contact her Medicaid worker to confirm . Positive reinforcement provided for her continued participation in mental  health treatment and follow through with the medicaid application process . Discussed plans with patient for ongoing care management follow up and provided patient with direct contact information for care management team   Patient Self Care Activities:  . Patient verbalizes understanding of plan to completed Medicaid application once received . Unable to perform IADLs independently . Knowledge deficit related to community resources to meet her medical needs  Please see past updates related to this goal by clicking on the "Past Updates" button in the  selected goal          Follow Up Plan: SW will follow up with patient by phone over the next 7-14 days    [name], [place] Clinical Social Worker  [facility] Care Management [phone]

## 2020-05-23 NOTE — Patient Instructions (Addendum)
Thank you allowing the Chronic Care Management Team to be a part of your care! It was a pleasure speaking with you today!  1. Please call your assigned Medicaid worker to follow up on documentation that will be accepted to confirm your residence  CCM (Chronic Care Management) Team   [name] RN, BSN Nurse Care Coordinator  [phone]  Ossie Beltran [address], LCSW Clinical Social Worker [phone]  Goals Addressed              This Visit's Progress   .  "I need a CT scan of my kidneys" (pt-stated)        Current Barriers:  . Financial constraints related to lack of insurance  to cover needed medical follow up  Clinical Social Work Clinical Goal(s):  [name] Over the next 90 days, patient will work on completing a medicaid application to cover needed medical follow up  Interventions: . Followed up with patient regarding medicaid application completion and submission. . Patient states that she has returned all required documents excluding proof of residence . This Child psychotherapist explored alternatives, including providing a hospital bill as proof, however recommended that she contact her Medicaid worker to confirm . Positive reinforcement provided for her continued participation in mental health treatment and follow through with the medicaid application process . Discussed plans with patient for ongoing care management follow up and provided patient with direct contact information for care management team   Patient Self Care Activities:  . Patient verbalizes understanding of plan to completed Medicaid application once received . Unable to perform IADLs independently . Knowledge deficit related to community resources to meet her medical needs  Please see past updates related to this goal by clicking on the "Past Updates" button in the selected goal          The patient verbalized understanding of instructions provided today and declined a print copy of patient instruction  materials.   Telephone follow up appointment with care management team member scheduled for: 05/29/20

## 2020-05-28 ENCOUNTER — Ambulatory Visit: Payer: Self-pay | Admitting: *Deleted

## 2020-05-28 ENCOUNTER — Telehealth: Payer: Self-pay | Admitting: Adult Health

## 2020-05-28 NOTE — Telephone Encounter (Signed)
Pt wants dr to know she is going to [facility] to get checked out, not [facility].
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
## 2020-05-29 ENCOUNTER — Ambulatory Visit: Payer: Self-pay | Admitting: *Deleted

## 2020-05-29 DIAGNOSIS — F331 Major depressive disorder, recurrent, moderate: Secondary | ICD-10-CM

## 2020-05-29 DIAGNOSIS — N261 Atrophy of kidney (terminal): Secondary | ICD-10-CM
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
## 2020-05-31 ENCOUNTER — Ambulatory Visit: Payer: Self-pay

## 2020-06-04 NOTE — Telephone Encounter (Signed)
Noted  

## 2020-06-10 ENCOUNTER — Other Ambulatory Visit: Payer: Self-pay | Admitting: Adult Health

## 2020-06-11 ENCOUNTER — Other Ambulatory Visit: Payer: Self-pay | Admitting: Adult Health

## 2020-06-11 DIAGNOSIS — E039 Hypothyroidism, unspecified: Secondary | ICD-10-CM

## 2020-06-11 MED ORDER — AMLODIPINE BESYLATE 10 MG PO TABS: 10.0000 mg | ORAL_TABLET | Freq: Every day | ORAL | 0 refills | Status: DC

## 2020-06-11 MED ORDER — BUTALBITAL-APAP-CAFFEINE 50-325-40 MG PO TABS: 1.0000 | ORAL_TABLET | Freq: Four times a day (QID) | ORAL | 1 refills | Status: DC | PRN

## 2020-06-11 MED ORDER — LEVOTHYROXINE SODIUM 75 MCG PO TABS: 75.0000 ug | ORAL_TABLET | Freq: Every day | ORAL | 0 refills | Status: DC

## 2020-06-12 ENCOUNTER — Ambulatory Visit: Payer: Self-pay | Admitting: *Deleted

## 2020-06-12 DIAGNOSIS — F331 Major depressive disorder, recurrent, moderate: Secondary | ICD-10-CM

## 2020-06-12 DIAGNOSIS — N261 Atrophy of kidney (terminal): Secondary | ICD-10-CM

## 2020-06-12 MED ORDER — BUTALBITAL-APAP-CAFFEINE 50-325-40 MG PO TABS: 1.0000 | ORAL_TABLET | Freq: Four times a day (QID) | ORAL | 1 refills | Status: DC | PRN
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
## 2020-06-12 NOTE — Addendum Note (Signed)
Addended by: Bertram Savin on: 06/12/2020 08:32 AM   Modules accepted: Orders

## 2020-06-13 ENCOUNTER — Encounter: Payer: Self-pay | Admitting: Adult Health

## 2020-06-16 ENCOUNTER — Other Ambulatory Visit: Payer: Self-pay | Admitting: Adult Health

## 2020-06-16 DIAGNOSIS — E039 Hypothyroidism, unspecified: Secondary | ICD-10-CM

## 2020-06-26 ENCOUNTER — Ambulatory Visit: Payer: Self-pay | Admitting: *Deleted

## 2020-06-26 DIAGNOSIS — N261 Atrophy of kidney (terminal): Secondary | ICD-10-CM

## 2020-06-26 DIAGNOSIS — F331 Major depressive disorder, recurrent, moderate: Secondary | ICD-10-CM

## 2020-06-26 NOTE — Chronic Care Management (AMB) (Signed)
Care Management    Clinical Social Work Follow Up Note  06/26/2020 Name: Leah Bright MRN: [id number] DOB: 1991-08-29  Leah Bright is a 29 y.o. year old female who is a primary care patient of Flinchum, Eula Fried, FNP. The CCM team was consulted for assistance with [facility] .   Review of patient status, including review of consultants reports, other relevant assessments, and collaboration with appropriate care team members and the patient's provider was performed as part of comprehensive patient evaluation and provision of chronic care management services.    SDOH (Social Determinants of Health) assessments performed: No    Outpatient Encounter Medications as of 06/26/2020  Medication Sig Note  . albuterol (PROVENTIL HFA;VENTOLIN HFA) 108 (90 Base) MCG/ACT inhaler Inhale 2 puffs into the lungs every 6 (six) hours as needed for wheezing or shortness of breath.   [name] amLODipine (NORVASC) 10 MG tablet Take 1 tablet (10 mg total) by mouth daily.   . busPIRone (BUSPAR) 5 MG tablet Take 5 mg by mouth 2 (two) times daily.   . butalbital-acetaminophen-caffeine (FIORICET) 50-325-40 MG tablet Take 1 tablet by mouth every 6 (six) hours as needed for headache.   . carbamide peroxide (DEBROX) 6.5 % OTIC solution Place 5 drops into the left ear 2 (two) times daily.   [name] levothyroxine (SYNTHROID) 75 MCG tablet Take 1 tablet (75 mcg total) by mouth daily before breakfast. Recheck lab around 06/23/20.   [name] lisinopril (ZESTRIL) 40 MG tablet Take 40 mg by mouth daily.   . meloxicam (MOBIC) 15 MG tablet Take 1 tablet (15 mg total) by mouth daily.   . metFORMIN (GLUCOPHAGE XR) 500 MG 24 hr tablet Take 4 tablets (2,000 mg total) by mouth daily.   . metoprolol succinate (TOPROL XL) 25 MG 24 hr tablet Take 1 tablet (25 mg total) by mouth daily. Check heart rate prior and do not take if heart rate is less than 60 beats per minute.   . norethindrone (MICRONOR) 0.35 MG tablet Take 1 tablet (0.35 mg  total) by mouth daily.   . ondansetron (ZOFRAN-ODT) 4 MG disintegrating tablet Take 1-2 tablets (4-8 mg total) by mouth every 8 (eight) hours as needed for nausea.   . predniSONE (DELTASONE) 50 MG tablet Take 1 tablet (50 mg total) by mouth daily with breakfast.   . traZODone (DESYREL) 100 MG tablet Take 100 mg by mouth at bedtime as needed for sleep. 01/19/2020: Patient reports she takes daily  . venlafaxine XR (EFFEXOR-XR) 75 MG 24 hr capsule Take 75 mg by mouth at bedtime.    No facility-administered encounter medications on file as of 06/26/2020.     Goals Addressed              This Visit's Progress   .  "I need a CT scan of my kidneys" (pt-stated)        Current Barriers:  . Financial constraints related to lack of insurance  to cover needed medical follow up  Clinical Social Work Clinical Goal(s):  [name] Over the next 90 days, patient will work on completing a medicaid application to cover needed medical follow up  Interventions: . Followed up with patient regarding medicaid application completion and submission. . Patient confirmed that she has everything submitted accept her spouse's banks statements, she is waiting on him to provide this  . Patient continues to  follow up  with her therapist (twice per week) and her psychiatrist and feels that her treatment is going well .  This social worker continued to encouraged contacting the Medicaid Worker to discuss required documents and need for more time to avoid starting process over. [name] Positive reinforcement provided for her continued participation in mental health treatment and follow through with the medicaid application process . Discussed plans with patient for ongoing care management follow up and provided patient with direct contact information for care management team   Patient Self Care Activities:  . Patient verbalizes understanding of plan to completed Medicaid application once received . Unable to perform IADLs  independently . Knowledge deficit related to community resources to meet her medical needs  Please see past updates related to this goal by clicking on the "Past Updates" button in the selected goal          Follow Up Plan: SW will follow up with patient by phone over the next 7-14 business days    [place], [place] Clinical Social Worker  [facility] Care Management [phone]

## 2020-06-26 NOTE — Patient Instructions (Signed)
Thank you allowing the Chronic Care Management Team to be a part of your care! It was a pleasure speaking with you today!  1. Please follow up with Medicaid regarding your application status as soon as possible  CCM (Chronic Care Management) Team   [name] RN, BSN Nurse Care Coordinator  [phone]  Laniece Hornbaker [address] LCSW Clinical Social Worker [phone]  Goals Addressed              This Visit's Progress   .  "I need a CT scan of my kidneys" (pt-stated)        Current Barriers:  . Financial constraints related to lack of insurance  to cover needed medical follow up  Clinical Social Work Clinical Goal(s):  [name] Over the next 90 days, patient will work on completing a medicaid application to cover needed medical follow up  Interventions: . Followed up with patient regarding medicaid application completion and submission. . Patient confirmed that she has everything submitted accept her spouse's banks statements, she is waiting on him to provide this  . Patient continues to  follow up  with her therapist (twice per week) and her psychiatrist and feels that her treatment is going well . This social worker continued to encouraged contacting the Medicaid Worker to discuss required documents and need for more time to avoid starting process over. [name] Positive reinforcement provided for her continued participation in mental health treatment and follow through with the medicaid application process . Discussed plans with patient for ongoing care management follow up and provided patient with direct contact information for care management team   Patient Self Care Activities:  . Patient verbalizes understanding of plan to completed Medicaid application once received . Unable to perform IADLs independently . Knowledge deficit related to community resources to meet her medical needs  Please see past updates related to this goal by clicking on the "Past Updates" button in the  selected goal          The patient verbalized understanding of instructions provided today and declined a print copy of patient instruction materials.   Telephone follow up appointment with care management team member scheduled for: 07/10/20

## 2020-07-08 ENCOUNTER — Other Ambulatory Visit: Payer: Self-pay | Admitting: Adult Health

## 2020-07-08 DIAGNOSIS — E039 Hypothyroidism, unspecified: Secondary | ICD-10-CM

## 2020-07-10 ENCOUNTER — Telehealth: Payer: Self-pay | Admitting: *Deleted

## 2020-07-10 NOTE — Telephone Encounter (Signed)
    Care Management   Unsuccessful Call Note 07/10/2020 Name: Leah Bright MRN: [id number] DOB: 09-04-1990  Patient  is a 29 year old female who sees Marvell Fuller, FNP for primary care. Marvell Fuller, FNP asked the CCM team to consult the patient for follow up call.     This social worker was unable to reach patient via telephone today for follow up call. I have left HIPAA compliant voicemail asking patient to return my call. (unsuccessful outreach #1).   Plan: Will follow-up within 7 business days via telephone.      Verna Czech, LCSW Clinical Social Worker  [facility] Care Management [phone]

## 2020-07-14 ENCOUNTER — Other Ambulatory Visit: Payer: Self-pay | Admitting: Adult Health

## 2020-07-14 DIAGNOSIS — E119 Type 2 diabetes mellitus without complications: Secondary | ICD-10-CM

## 2020-07-14 NOTE — Telephone Encounter (Signed)
Requested Prescriptions  Pending Prescriptions Disp Refills  . metFORMIN (GLUCOPHAGE-XR) 500 MG 24 hr tablet [Pharmacy Med Name: metFORMIN HCl ER 500 MG Oral Tablet Extended Release 24 Hour] 120 tablet 0    Sig: TAKE 4 TABLETS BY MOUTH ONCE DAILY     Endocrinology:  Diabetes - Biguanides Failed - 07/14/2020  2:09 PM      Failed - HBA1C is between 0 and 7.9 and within 180 days    Hemoglobin A1C  Date Value Ref Range Status  05/21/2020 8.0 (A) 4.0 - 5.6 % Final   Hgb A1c MFr Bld  Date Value Ref Range Status  03/19/2020 8.2 (H) 4.8 - 5.6 % Final    Comment:             Prediabetes: 5.7 - 6.4          Diabetes: >6.4          Glycemic control for adults with diabetes: <7.0          Passed - Cr in normal range and within 360 days    Creatinine, Ser  Date Value Ref Range Status  05/22/2020 0.72 0.44 - 1.00 mg/dL Final         Passed - eGFR in normal range and within 360 days    GFR calc Af Amer  Date Value Ref Range Status  05/22/2020 >60 >60 mL/min Final   GFR calc non Af Amer  Date Value Ref Range Status  05/22/2020 >60 >60 mL/min Final         Passed - Valid encounter within last 6 months    Recent Outpatient Visits          1 month ago Type 2 diabetes mellitus without complication, without long-term current use of insulin (HCC)   [facility] Flinchum, Michelle S, FNP   4 months ago Generalized abdominal pain   [facility] Chrismon, Dennis E, PA   5 months ago Type 2 diabetes mellitus without complication, without long-term current use of insulin (HCC)   [facility] Flinchum, Michelle S, FNP   6 months ago Toe pain, right   [facility] Pollak, Adriana M, PA-C   9 months ago Hordeolum externum of left upper eyelid   [facility] Flinchum, Michelle S, FNP      Future Appointments            In 1 month Flinchum, Michelle S, FNP [facility]             

## 2020-07-16 NOTE — Telephone Encounter (Signed)
Patient has not had TSH checked since July please advise if appropriate to refill or does patient need labs drawn first? KW

## 2020-07-17 NOTE — Telephone Encounter (Signed)
Patient needs scheduled appointment / labs

## 2020-07-18 MED ORDER — LEVOTHYROXINE SODIUM 75 MCG PO TABS: 75.0000 ug | ORAL_TABLET | Freq: Every day | ORAL | 0 refills | Status: DC

## 2020-07-18 NOTE — Telephone Encounter (Signed)
Pt advised.  She has a follow up appointment 08/16/2020.  Also pt reports having a "Cough" that has been going to for a few days.  She scheduled a virtual visit for 07/19/2020 at 9:40  Thanks,   -[name]

## 2020-07-19 ENCOUNTER — Ambulatory Visit
Admission: EM | Admit: 2020-07-19 | Discharge: 2020-07-19 | Disposition: A | Discharge status: Home / Self Care | Payer: Self-pay | Attending: Physician Assistant | Admitting: Physician Assistant

## 2020-07-19 ENCOUNTER — Encounter: Payer: Self-pay | Admitting: Adult Health

## 2020-07-19 ENCOUNTER — Other Ambulatory Visit: Payer: Self-pay

## 2020-07-19 ENCOUNTER — Telehealth: Payer: Self-pay | Admitting: *Deleted

## 2020-07-19 ENCOUNTER — Telehealth (INDEPENDENT_AMBULATORY_CARE_PROVIDER_SITE_OTHER): Payer: Self-pay | Admitting: Adult Health

## 2020-07-19 VITALS — Temp 98.6°F

## 2020-07-19 DIAGNOSIS — I129 Hypertensive chronic kidney disease with stage 1 through stage 4 chronic kidney disease, or unspecified chronic kidney disease: Secondary | ICD-10-CM | POA: Insufficient documentation

## 2020-07-19 DIAGNOSIS — E1122 Type 2 diabetes mellitus with diabetic chronic kidney disease: Secondary | ICD-10-CM | POA: Insufficient documentation

## 2020-07-19 DIAGNOSIS — I152 Hypertension secondary to endocrine disorders: Secondary | ICD-10-CM

## 2020-07-19 DIAGNOSIS — R197 Diarrhea, unspecified: Secondary | ICD-10-CM | POA: Insufficient documentation

## 2020-07-19 DIAGNOSIS — R0789 Other chest pain: Secondary | ICD-10-CM | POA: Insufficient documentation

## 2020-07-19 DIAGNOSIS — Z79899 Other long term (current) drug therapy: Secondary | ICD-10-CM | POA: Insufficient documentation

## 2020-07-19 DIAGNOSIS — R051 Acute cough: Secondary | ICD-10-CM | POA: Insufficient documentation

## 2020-07-19 DIAGNOSIS — B349 Viral infection, unspecified: Secondary | ICD-10-CM | POA: Insufficient documentation

## 2020-07-19 DIAGNOSIS — Z20822 Contact with and (suspected) exposure to covid-19: Secondary | ICD-10-CM | POA: Insufficient documentation

## 2020-07-19 DIAGNOSIS — E1159 Type 2 diabetes mellitus with other circulatory complications: Secondary | ICD-10-CM | POA: Insufficient documentation

## 2020-07-19 DIAGNOSIS — R112 Nausea with vomiting, unspecified: Secondary | ICD-10-CM | POA: Insufficient documentation

## 2020-07-19 DIAGNOSIS — Z885 Allergy status to narcotic agent status: Secondary | ICD-10-CM | POA: Insufficient documentation

## 2020-07-19 DIAGNOSIS — R059 Cough, unspecified: Secondary | ICD-10-CM

## 2020-07-19 DIAGNOSIS — J069 Acute upper respiratory infection, unspecified: Secondary | ICD-10-CM | POA: Insufficient documentation

## 2020-07-19 DIAGNOSIS — E119 Type 2 diabetes mellitus without complications: Secondary | ICD-10-CM

## 2020-07-19 DIAGNOSIS — Z888 Allergy status to other drugs, medicaments and biological substances status: Secondary | ICD-10-CM | POA: Insufficient documentation

## 2020-07-19 DIAGNOSIS — J45901 Unspecified asthma with (acute) exacerbation: Secondary | ICD-10-CM | POA: Insufficient documentation

## 2020-07-19 DIAGNOSIS — N189 Chronic kidney disease, unspecified: Secondary | ICD-10-CM | POA: Insufficient documentation

## 2020-07-19 DIAGNOSIS — F411 Generalized anxiety disorder: Secondary | ICD-10-CM

## 2020-07-19 LAB — RESP PANEL BY RT-PCR (RSV, FLU A&B, COVID)  RVPGX2
Influenza A by PCR: NEGATIVE
Influenza B by PCR: NEGATIVE
Resp Syncytial Virus by PCR: NEGATIVE
SARS Coronavirus 2 by RT PCR: NEGATIVE

## 2020-07-19 MED ORDER — ALBUTEROL SULFATE (5 MG/ML) 0.5% IN NEBU: 2.5000 mg | INHALATION_SOLUTION | Freq: Four times a day (QID) | RESPIRATORY_TRACT | 3 refills | Status: DC | PRN

## 2020-07-19 MED ORDER — HYDROCOD POLST-CPM POLST ER 10-8 MG/5ML PO SUER
5.0000 mL | Freq: Two times a day (BID) | ORAL | 0 refills | Status: AC | PRN
Start: 2020-07-19 — End: 2020-07-26

## 2020-07-19 MED ORDER — PREDNISONE 20 MG PO TABS: 40.0000 mg | ORAL_TABLET | Freq: Every day | ORAL | 0 refills | Status: AC

## 2020-07-19 MED ORDER — ALBUTEROL SULFATE HFA 108 (90 BASE) MCG/ACT IN AERS: 2.0000 | INHALATION_SPRAY | Freq: Four times a day (QID) | RESPIRATORY_TRACT | 0 refills | Status: DC | PRN

## 2020-07-19 NOTE — Patient Instructions (Signed)
Go to the [facility] urgent care for in person evaluation now.    Vomiting, Adult Vomiting occurs when stomach contents are thrown up and out of the mouth. Many people notice nausea before vomiting. Vomiting can make you feel weak and cause you to become dehydrated. Dehydration can make you feel tired and thirsty, cause you to have a dry mouth, and decrease how often you urinate. Older adults and people who have other diseases or a weak body defense system (immune system) are at higher risk for dehydration. It is important to treat vomiting as told by your health care provider. Follow these instructions at home:  Eating and drinking     Follow these recommendations as told by your health care provider:  Take an oral rehydration solution (ORS). This is a drink that is sold at pharmacies and retail stores.  Eat bland, easy-to-digest foods in small amounts as you are able. These foods include bananas, applesauce, rice, lean meats, toast, and crackers.  Drink clear fluids slowly and in small amounts as you are able. Clear fluids include water, ice chips, low-calorie sports drinks, and fruit juice that has water added (diluted fruit juice).  Avoid drinking fluids that contain a lot of sugar or caffeine, such as energy drinks, sports drinks, and soda.  Avoid alcohol.  Avoid spicy or fatty foods.  General instructions  Wash your hands often using soap and water. If soap and water are not available, use hand sanitizer. Make sure that everyone in your household washes their hands frequently.  Take over-the-counter and prescription medicines only as told by your health care provider.  Rest at home while you recover.  Watch your condition for any changes.  Keep all follow-up visits as told by your health care provider. This is important. Contact a health care provider if:  Your vomiting gets worse.  You have new symptoms.  You have a fever.  You cannot drink fluids without  vomiting.  You feel light-headed or dizzy.  You have a headache.  You have muscle cramps.  You have a rash.  You have pain while urinating. Get help right away if:  You have pain in your chest, neck, arm, or jaw.  You feel extremely weak or you faint.  You have persistent vomiting.  You have vomit that is bright red or looks like black coffee grounds.  You have stools that are bloody or black, or stools that look like tar.  You have a severe headache, a stiff neck, or both.  You have severe pain, cramping, or bloating in your abdomen.  You have trouble breathing or you are breathing very quickly.  Your heart is beating very quickly.  Your skin feels cold and clammy.  You feel confused.  You have signs of dehydration, such as: ? Dark urine, very little urine, or no urine. ? Cracked lips. ? Dry mouth. ? Sunken eyes. ? Sleepiness. ? Weakness. These symptoms may represent a serious problem that is an emergency. Do not wait to see if the symptoms will go away. Get medical help right away. Call your local emergency services (911 in the [place] Do not drive yourself to the hospital. Summary  Vomiting occurs when stomach contents are thrown up and out of the mouth. Vomiting can cause you to become dehydrated. Older adults and people who have other diseases or a weak immune system are at higher risk for dehydration.  It is important to treat vomiting as told by your health care provider. Follow your  health care provider's instructions about eating and drinking.  Wash your hands often using soap and water. If soap and water are not available, use hand sanitizer. Make sure that everyone in your household washes their hands frequently.  Watch your condition for any changes and for signs of dehydration.  Keep all follow-up visits as told by your health care provider. This is important. This information is not intended to replace advice given to you by your health care  provider. Make sure you discuss any questions you have with your health care provider. Document Revised: 02/04/2019 Document Reviewed: 01/26/2018 Elsevier Patient Education  2020 [employer] Food Choices to Help Relieve Diarrhea, Adult When you have diarrhea, the foods you eat and your eating habits are very important. Choosing the right foods and drinks can help:  Relieve diarrhea.  Replace lost fluids and nutrients.  Prevent dehydration. What general guidelines should I follow?  Relieving diarrhea  Choose foods with less than 2 g or .07 oz. of fiber per serving.  Limit fats to less than 8 tsp (38 g or 1.34 oz.) a day.  Avoid the following: ? Foods and beverages sweetened with high-fructose corn syrup, honey, or sugar alcohols such as xylitol, sorbitol, and mannitol. ? Foods that contain a lot of fat or sugar. ? Fried, greasy, or spicy foods. ? High-fiber grains, breads, and cereals. ? Raw fruits and vegetables.  Eat foods that are rich in probiotics. These foods include dairy products such as yogurt and fermented milk products. They help increase healthy bacteria in the stomach and intestines (gastrointestinal tract, or GI tract).  If you have lactose intolerance, avoid dairy products. These may make your diarrhea worse.  Take medicine to help stop diarrhea (antidiarrheal medicine) only as told by your health care provider. Replacing nutrients  Eat small meals or snacks every 3-4 hours.  Eat bland foods, such as white rice, toast, or baked potato, until your diarrhea starts to get better. Gradually reintroduce nutrient-rich foods as tolerated or as told by your health care provider. This includes: ? Well-cooked protein foods. ? Peeled, seeded, and soft-cooked fruits and vegetables. ? Low-fat dairy products.  Take vitamin and mineral supplements as told by your health care provider. Preventing dehydration  Start by sipping water or a special solution to prevent  dehydration (oral rehydration solution, ORS). Urine that is clear or pale yellow means that you are getting enough fluid.  Try to drink at least 8-10 cups of fluid each day to help replace lost fluids.  You may add other liquids in addition to water, such as clear juice or decaffeinated sports drinks, as tolerated or as told by your health care provider.  Avoid drinks with caffeine, such as coffee, tea, or soft drinks.  Avoid alcohol. What foods are recommended?     The items listed may not be a complete list. Talk with your health care provider about what dietary choices are best for you. Grains White rice. White, [place]French, or pita breads (fresh or toasted), including plain rolls, buns, or bagels. White pasta. Saltine, soda, or graham crackers. Pretzels. Low-fiber cereal. Cooked cereals made with water (such as cornmeal, farina, or cream cereals). Plain muffins. Matzo. Melba toast. Zwieback. Vegetables Potatoes (without the skin). Most well-cooked and canned vegetables without skins or seeds. Tender lettuce. Fruits Apple sauce. Fruits canned in juice. Cooked apricots, cherries, grapefruit, peaches, pears, or plums. Fresh bananas and cantaloupe. Meats and other protein foods Baked or boiled chicken. Eggs. Tofu. Fish. Seafood. Smooth nut  butters. Ground or well-cooked tender beef, ham, veal, lamb, pork, or poultry. Dairy Plain yogurt, kefir, and unsweetened liquid yogurt. Lactose-free milk, buttermilk, skim milk, or soy milk. Low-fat or nonfat hard cheese. Beverages Water. Low-calorie sports drinks. Fruit juices without pulp. Strained tomato and vegetable juices. Decaffeinated teas. Sugar-free beverages not sweetened with sugar alcohols. Oral rehydration solutions, if approved by your health care provider. Seasoning and other foods Bouillon, broth, or soups made from recommended foods. What foods are not recommended? The items listed may not be a complete list. Talk with your health care  provider about what dietary choices are best for you. Grains Whole grain, whole wheat, bran, or rye breads, rolls, pastas, and crackers. Wild or brown rice. Whole grain or bran cereals. Barley. Oats and oatmeal. Corn tortillas or taco shells. Granola. Popcorn. Vegetables Raw vegetables. Fried vegetables. Cabbage, broccoli, Brussels sprouts, artichokes, baked beans, beet greens, corn, kale, legumes, peas, sweet potatoes, and yams. Potato skins. Cooked spinach and cabbage. Fruits Dried fruit, including raisins and dates. Raw fruits. Stewed or dried prunes. Canned fruits with syrup. Meat and other protein foods Fried or fatty meats. Deli meats. Chunky nut butters. Nuts and seeds. Beans and lentils. Tomasa Blase. Hot dogs. Sausage. Dairy High-fat cheeses. Whole milk, chocolate milk, and beverages made with milk, such as milk shakes. Half-and-half. Cream. sour cream. Ice cream. Beverages Caffeinated beverages (such as coffee, tea, soda, or energy drinks). Alcoholic beverages. Fruit juices with pulp. Prune juice. Soft drinks sweetened with high-fructose corn syrup or sugar alcohols. High-calorie sports drinks. Fats and oils Butter. Cream sauces. Margarine. Salad oils. Plain salad dressings. Olives. Avocados. Mayonnaise. Sweets and desserts Sweet rolls, doughnuts, and sweet breads. Sugar-free desserts sweetened with sugar alcohols such as xylitol and sorbitol. Seasoning and other foods Honey. Hot sauce. Chili powder. Gravy. Cream-based or milk-based soups. Pancakes and waffles. Summary  When you have diarrhea, the foods you eat and your eating habits are very important.  Make sure you get at least 8-10 cups of fluid each day, or enough to keep your urine clear or pale yellow.  Eat bland foods and gradually reintroduce healthy, nutrient-rich foods as tolerated, or as told by your health care provider.  Avoid high-fiber, fried, greasy, or spicy foods. This information is not intended to replace advice  given to you by your health care provider. Make sure you discuss any questions you have with your health care provider. Document Revised: 12/09/2018 Document Reviewed: 08/15/2016 Elsevier Patient Education  2020 Elsevier Inc. Upper Respiratory Infection, Adult An upper respiratory infection (URI) affects the nose, throat, and upper air passages. URIs are caused by germs (viruses). The most common type of URI is often called "the common cold." Medicines cannot cure URIs, but you can do things at home to relieve your symptoms. URIs usually get better within 7-10 days. Follow these instructions at home: Activity  Rest as needed.  If you have a fever, stay home from work or school until your fever is gone, or until your doctor says you may return to work or school. ? You should stay home until you cannot spread the infection anymore (you are not contagious). ? Your doctor may have you wear a face mask so you have less risk of spreading the infection. Relieving symptoms  Gargle with a salt-water mixture 3-4 times a day or as needed. To make a salt-water mixture, completely dissolve -1 tsp of salt in 1 cup of warm water.  Use a cool-mist humidifier to add moisture to the air.  This can help you breathe more easily. Eating and drinking   Drink enough fluid to keep your pee (urine) pale yellow.  Eat soups and other clear broths. General instructions   Take over-the-counter and prescription medicines only as told by your doctor. These include cold medicines, fever reducers, and cough suppressants.  Do not use any products that contain nicotine or tobacco. These include cigarettes and e-cigarettes. If you need help quitting, ask your doctor.  Avoid being where people are smoking (avoid secondhand smoke).  Make sure you get regular shots and get the flu shot every year.  Keep all follow-up visits as told by your doctor. This is important. How to avoid spreading infection to  others   Wash your hands often with soap and water. If you do not have soap and water, use hand sanitizer.  Avoid touching your mouth, face, eyes, or nose.  Cough or sneeze into a tissue or your sleeve or elbow. Do not cough or sneeze into your hand or into the air. Contact a doctor if:  You are getting worse, not better.  You have any of these: ? A fever. ? Chills. ? Brown or red mucus in your nose. ? Yellow or brown fluid (discharge)coming from your nose. ? Pain in your face, especially when you bend forward. ? Swollen neck glands. ? Pain with swallowing. ? White areas in the back of your throat. Get help right away if:  You have shortness of breath that gets worse.  You have very bad or constant: ? Headache. ? Ear pain. ? Pain in your forehead, behind your eyes, and over your cheekbones (sinus pain). ? Chest pain.  You have long-lasting (chronic) lung disease along with any of these: ? Wheezing. ? Long-lasting cough. ? Coughing up blood. ? A change in your usual mucus.  You have a stiff neck.  You have changes in your: ? Vision. ? Hearing. ? Thinking. ? Mood. Summary  An upper respiratory infection (URI) is caused by a germ called a virus. The most common type of URI is often called "the common cold."  URIs usually get better within 7-10 days.  Take over-the-counter and prescription medicines only as told by your doctor. This information is not intended to replace advice given to you by your health care provider. Make sure you discuss any questions you have with your health care provider. Document Revised: 08/26/2018 Document Reviewed: 04/10/2017 Elsevier Patient Education  2020 [employer].

## 2020-07-19 NOTE — Telephone Encounter (Signed)
    Care Management   Unsuccessful Call Note 07/19/2020 Name: Leah Bright MRN: [id number] DOB: Jul 12, 1991  Patient  is a 29 year old female who sees Marvell Fuller, FNP for primary care. Marvell Fuller, FNP asked the CCM team to consult the patient for [employer].  This social worker was unable to reach patient via telephone today for follow up call. I have left HIPAA compliant voicemail asking patient to return my call. (unsuccessful outreach #2).   Plan: Will follow-up within 7 business days via telephone.      Verna Czech, LCSW Clinical Social Worker  [facility] Care Management [phone]

## 2020-07-19 NOTE — Discharge Instructions (Signed)
I will call you with results of your respiratory panel.  At this time, go home and rest increase fluid intake.  You can take the Tussionex as needed for cough that is severe.  He is to be having a mild asthma exacerbation so start the prednisone and also refill the albuterol nebulizer and inhaler for you to use as needed.  You should return or go to the emergency department if you start to have any worsening chest pain or breathing difficulty.

## 2020-07-19 NOTE — ED Triage Notes (Addendum)
Pt reports sx since Friday. Nasal congestion, cough, chest tight with coughing, nauseated/diarrhea

## 2020-07-19 NOTE — Progress Notes (Signed)
MyChart Video Visit    Virtual Visit via Video Note   This visit type was conducted due to national recommendations for restrictions regarding the COVID-19 Pandemic (e.g. social distancing) in an effort to limit this patient's exposure and mitigate transmission in our community. This patient is at least at moderate risk for complications without adequate follow up. This format is felt to be most appropriate for this patient at this time. Physical exam was limited by quality of the video and audio technology used for the visit.   Patient location: at home  Provider location: Provider: Provider's office at  [facility], [place].     I discussed the limitations of evaluation and management by telemedicine and the availability of in person appointments. The patient expressed understanding and agreed to proceed.  Patient: Leah Bright   DOB: 1991/07/19   29 y.o. Female  MRN: [id number] Visit Date: 07/19/2020  Today's healthcare provider: Jairo Ben, FNP   Chief Complaint  Patient presents with  . Cough   Subjective    Cough This is a new problem. Episode onset: 6 days ago. The problem has been gradually worsening. The cough is non-productive. Associated symptoms include postnasal drip, rhinorrhea, a sore throat, shortness of breath and wheezing. Pertinent negatives include no chest pain, chills, ear congestion, ear pain, fever, headaches or nasal congestion. Treatments tried: Albuterol inhaler, Ondansterone, nebulizer treatment. The treatment provided mild relief.    Onset 1 week ago. She has been nauseated and dizzy, feels as if her heart is racing.  Afebrile.  She has vomited x 1 last night and again this morning.   She has another sick person in home. Very bad sore throat feels like swallowing pins and needles. Shortness of breath at times with coughing reported. Denies any distress.    Patient  denies any fever,chills, rash,, nausea,  vomiting, or diarrhea.   Denies  pre syncopal or syncopal episodes.       Medications: Outpatient Medications Prior to Visit  Medication Sig  . albuterol (PROVENTIL HFA;VENTOLIN HFA) 108 (90 Base) MCG/ACT inhaler Inhale 2 puffs into the lungs every 6 (six) hours as needed for wheezing or shortness of breath.  [name] amLODipine (NORVASC) 10 MG tablet Take 1 tablet (10 mg total) by mouth daily.  . busPIRone (BUSPAR) 5 MG tablet Take 5 mg by mouth 2 (two) times daily.  . butalbital-acetaminophen-caffeine (FIORICET) 50-325-40 MG tablet Take 1 tablet by mouth every 6 (six) hours as needed for headache.  . levothyroxine (SYNTHROID) 75 MCG tablet Take 1 tablet (75 mcg total) by mouth daily before breakfast. Please go to lab for lab test.  . lisinopril (ZESTRIL) 40 MG tablet Take 40 mg by mouth daily.  . metFORMIN (GLUCOPHAGE-XR) 500 MG 24 hr tablet TAKE 4 TABLETS BY MOUTH ONCE DAILY  . metoprolol succinate (TOPROL XL) 25 MG 24 hr tablet Take 1 tablet (25 mg total) by mouth daily. Check heart rate prior and do not take if heart rate is less than 60 beats per minute.  . norethindrone (MICRONOR) 0.35 MG tablet Take 1 tablet (0.35 mg total) by mouth daily.  . ondansetron (ZOFRAN-ODT) 4 MG disintegrating tablet Take 1-2 tablets (4-8 mg total) by mouth every 8 (eight) hours as needed for nausea.  . traZODone (DESYREL) 100 MG tablet Take 100 mg by mouth at bedtime as needed for sleep.  [name] venlafaxine XR (EFFEXOR-XR) 75 MG 24 hr capsule Take 75 mg by mouth at bedtime.  . meloxicam (  MOBIC) 15 MG tablet Take 1 tablet (15 mg total) by mouth daily. (Patient not taking: Reported on 07/19/2020)  . predniSONE (DELTASONE) 50 MG tablet Take 1 tablet (50 mg total) by mouth daily with breakfast. (Patient not taking: Reported on 07/19/2020)  . [DISCONTINUED] carbamide peroxide (DEBROX) 6.5 % OTIC solution Place 5 drops into the left ear 2 (two) times daily. (Patient not taking: Reported on 07/19/2020)   No  facility-administered medications prior to visit.    Review of Systems  Constitutional: Positive for fatigue. Negative for appetite change, chills, diaphoresis and fever.  HENT: Positive for postnasal drip, rhinorrhea, sneezing and sore throat. Negative for congestion and ear pain.   Respiratory: Positive for cough, shortness of breath and wheezing. Negative for apnea, choking, chest tightness and stridor.        Hurts to deep breath  Cardiovascular: Negative for chest pain and palpitations.  Gastrointestinal: Positive for diarrhea, nausea and vomiting. Negative for abdominal distention, abdominal pain, anal bleeding, blood in stool, constipation and rectal pain.  Genitourinary: Negative.   Musculoskeletal: Positive for arthralgias. Negative for back pain, joint swelling and neck stiffness.  Skin: Negative.   Neurological: Positive for dizziness. Negative for weakness and headaches.  Psychiatric/Behavioral: Negative.       Objective    Temp 98.6 F (37 C) (Oral)    Physical Exam  No vital signs available.  Patient appears ill on video, pale skin.  Patient is alert and oriented and responsive to questions Engages in conversation with provider. Speaks in full sentences without any pauses without any shortness of breath or distress.     Assessment & Plan      Suspected COVID-19 virus infection  Viral upper respiratory tract infection  Nausea and vomiting, intractability of vomiting not specified, unspecified vomiting type  Type 2 diabetes mellitus without complication, without long-term current use of insulin (HCC)  Hypertension associated with diabetes (HCC)  Generalized anxiety disorder   Patient will go to [facility] for in person evaluation, covid testing and chest x- ray - CBC and any other testing needed.  In person evaluation will be best.   She is aware she needs her labs done as well, has not come in for ordered labs.   Return if symptoms worsen or  fail to improve, for at any time for any worsening symptoms, Go to Emergency room/ urgent care if worse.     I discussed the assessment and treatment plan with the patient. The patient was provided an opportunity to ask questions and all were answered. The patient agreed with the plan and demonstrated an understanding of the instructions.   The patient was advised to call back or seek an in-person evaluation if the symptoms worsen or if the condition fails to improve as anticipated.  I provided 30 minutes of non-face-to-face time during this encounter.  I discussed the limitations of evaluation and management by telemedicine and the availability of in person appointments. The patient expressed understanding and agreed to proceed.   Jairo Ben, FNP [facility] [phone] (phone) [phone] (fax)  [facility]

## 2020-07-19 NOTE — ED Provider Notes (Signed)
[facility]    CSN: [id number] Arrival date & time: 07/19/20  1142      History   Chief Complaint Chief Complaint  Patient presents with   Nasal Congestion   Cough   Diarrhea    HPI Leah Bright is a 29 y.o. female presenting for 6-day history of nasal congestion, cough and feeling nauseous and having diarrhea.  Patient denies any associated fever but does admit to fatigue and overall not feeling well.  She says she has had one episode of vomiting.  She says she has a lot of pain all across her chest worse on the right side whenever she coughs.  States she has been coughing a lot and over-the-counter cough medication has not stopped her cough.  She says she is not really short of breath.  She says she has an albuterol inhaler and nebulizer to use if she feels short of breath when she does have asthma.  She states that she return of her nebulizer solution and is almost out of her inhaler though.  Past medical history is significant for diabetes, asthma, hypertension, hyperlipidemia, anxiety, and chronic kidney disease.  Patient also says she has had a history of chest wall pain due to costochondritis but the symptoms today feel little bit different.  Denies any radiation of pain to her arms neck or back.  Pain only when deep breathing and coughing.  Cough is mostly dry.  Patient denies any known Covid exposure.  Patient not vaccinated for Covid.  Patient did have an appointment with PCP today and was advised to follow-up with our department due to concerns about possible Covid.   HPI  Past Medical History:  Diagnosis Date   Agitation    Allergy    Anxiety    Asthma    Chronic kidney disease    Depression    Diabetes mellitus without complication (HCC)    High cholesterol    Hypertension    Hypothyroidism    Left renal artery stenosis (HCC) 09/07/2019   Migraine    OCD (obsessive compulsive disorder)    PTSD (post-traumatic stress disorder)     Renal artery stenosis (HCC)    Right renal atrophy    Social anxiety disorder    Social anxiety disorder    Social phobia    Thyroid disease     Patient Active Problem List   Diagnosis Date Noted   Nausea and vomiting 07/19/2020   Viral upper respiratory tract infection 07/19/2020   Suspected COVID-19 virus infection 07/19/2020   Generalized anxiety disorder 05/21/2020   Mood disorder (HCC) 01/19/2020   Elevated liver enzymes 01/19/2020   Hordeolum externum of left upper eyelid 10/13/2019   Renal atrophy, right 09/20/2019   History of diabetes mellitus, type II 09/20/2019   Asthma, chronic 09/19/2019   Migraine without aura and without status migrainosus, not intractable 09/18/2019   Hypothyroidism 08/02/2018   Essential hypertension 08/02/2018   Morbid obesity (HCC) 08/02/2018   Type 2 diabetes mellitus without complication, without long-term current use of insulin (HCC) 08/02/2018   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/02/2018   Asthma exacerbation 09/28/2015   CAP (community acquired pneumonia) 09/27/2015   Hypokalemia 09/27/2015   Hypomagnesemia 09/27/2015   Hypoxia 09/27/2015   Sepsis due to pneumonia (HCC) 09/27/2015   Hypertension associated with diabetes (HCC) 09/27/2015   Abdominal pain 01/22/2013   Diarrhea 01/22/2013   Rash 01/22/2013    Past Surgical  History:  Procedure Laterality Date   CHOLECYSTECTOMY  2008   endoscopy/colonoscopy   2011   LAPAROSCOPIC GASTRIC BANDING  2007   LAPAROSCOPIC REPAIR AND REMOVAL OF GASTRIC BAND  2011   TONSILLECTOMY  1994   WISDOM TOOTH EXTRACTION      OB History    Gravida  0   Para  0   Term  0   Preterm  0   AB  0   Living  0     SAB  0   TAB  0   Ectopic  0   Multiple  0   Live Births  0            Home Medications    Prior to Admission medications   Medication Sig Start Date End Date Taking?  Authorizing Provider  albuterol (PROVENTIL) (5 MG/ML) 0.5% nebulizer solution Take 0.5 mLs (2.5 mg total) by nebulization every 6 (six) hours as needed for up to 5 days for wheezing or shortness of breath. 07/19/20 07/24/20  Shirlee Latch, PA-C  albuterol (VENTOLIN HFA) 108 (90 Base) MCG/ACT inhaler Inhale 2 puffs into the lungs every 6 (six) hours as needed for wheezing or shortness of breath. 07/19/20 08/18/20  Eusebio Friendly B, PA-C  amLODipine (NORVASC) 10 MG tablet Take 1 tablet (10 mg total) by mouth daily. 06/11/20   Flinchum, Eula Fried, FNP  busPIRone (BUSPAR) 5 MG tablet Take 5 mg by mouth 2 (two) times daily.    [provider]  butalbital-acetaminophen-caffeine (FIORICET) 50-325-40 MG tablet Take 1 tablet by mouth every 6 (six) hours as needed for headache. 06/12/20   Anson Fret, MD  chlorpheniramine-HYDROcodone (TUSSIONEX PENNKINETIC ER) 10-8 MG/5ML SUER Take 5 mLs by mouth every 12 (twelve) hours as needed for up to 7 days for cough. 07/19/20 07/26/20  Shirlee Latch, PA-C  levothyroxine (SYNTHROID) 75 MCG tablet Take 1 tablet (75 mcg total) by mouth daily before breakfast. Please go to lab for lab test. 07/18/20   Flinchum, Eula Fried, FNP  lisinopril (ZESTRIL) 40 MG tablet Take 40 mg by mouth daily.    [provider]  meloxicam (MOBIC) 15 MG tablet Take 1 tablet (15 mg total) by mouth daily. Patient not taking: Reported on 07/19/2020 05/22/20   Cuthriell, Delorise Royals, PA-C  metFORMIN (GLUCOPHAGE-XR) 500 MG 24 hr tablet TAKE 4 TABLETS BY MOUTH ONCE DAILY 07/14/20   Flinchum, Eula Fried, FNP  metoprolol succinate (TOPROL XL) 25 MG 24 hr tablet Take 1 tablet (25 mg total) by mouth daily. Check heart rate prior and do not take if heart rate is less than 60 beats per minute. 05/21/20   Flinchum, Eula Fried, FNP  norethindrone (MICRONOR) 0.35 MG tablet Take 1 tablet (0.35 mg total) by mouth daily. 07/27/19   Schuman, Christanna R, MD  ondansetron (ZOFRAN-ODT) 4 MG  disintegrating tablet Take 1-2 tablets (4-8 mg total) by mouth every 8 (eight) hours as needed for nausea. 09/15/19   Anson Fret, MD  predniSONE (DELTASONE) 20 MG tablet Take 2 tablets (40 mg total) by mouth daily for 5 days. 07/19/20 07/24/20  Shirlee Latch, PA-C  traZODone (DESYREL) 100 MG tablet Take 100 mg by mouth at bedtime as needed for sleep.    [provider]  venlafaxine XR (EFFEXOR-XR) 75 MG 24 hr capsule Take 75 mg by mouth at bedtime.    [provider]    Family History Family History  Problem Relation Age of Onset   Cancer  Mother        brain tumor   Hypertension Mother    Depression Mother    Diabetes Mother    Migraines Mother        benign tumor, still gets headaches sometimes now    Anxiety disorder Father    Depression Father    Hypertension Maternal Grandmother    Thyroid disease Maternal Grandmother    Diabetes Maternal Grandmother    Hypertension Maternal Grandfather    Cancer Maternal Grandfather        myositis   High blood pressure Maternal Grandfather    Diabetes Maternal Grandfather    Stroke Maternal Grandfather     Social History Social History   Tobacco Use   Smoking status: Never Smoker   Smokeless tobacco: Never Used  Building services engineer Use: Never used  Substance Use Topics   Alcohol use: Yes    Comment: rarely, maybe 1 shot every 2-3 months   Drug use: Never     Allergies   Toradol [ketorolac tromethamine], Codeine, Other, Pepto-bismol [bismuth], and Tramadol   Review of Systems Review of Systems  Constitutional: Positive for fatigue. Negative for chills, diaphoresis and fever.  HENT: Positive for congestion and rhinorrhea. Negative for ear pain, sinus pressure, sinus pain and sore throat.   Respiratory: Positive for cough and chest tightness. Negative for shortness of breath and wheezing.   Gastrointestinal: Positive for diarrhea, nausea and vomiting. Negative for abdominal pain.    Musculoskeletal: Positive for myalgias. Negative for arthralgias.  Skin: Negative for rash.  Neurological: Positive for dizziness. Negative for weakness and headaches.  Hematological: Negative for adenopathy.     Physical Exam Triage Vital Signs ED Triage Vitals  Enc Vitals Group     BP 07/19/20 1216 136/66     Pulse Rate 07/19/20 1216 87     Resp 07/19/20 1216 20     Temp 07/19/20 1216 98.7 F (37.1 C)     Temp Source 07/19/20 1216 Oral     SpO2 07/19/20 1216 99 %     Weight 07/19/20 1215 (!) 303 lb (137.4 kg)     Height --      Head Circumference --      Peak Flow --      Pain Score 07/19/20 1214 7     Pain Loc --      Pain Edu? --      Excl. in GC? --    No data found.  Updated Vital Signs BP 136/66 (BP Location: Right Arm)    Pulse 87    Temp 98.7 F (37.1 C) (Oral)    Resp 20    Wt (!) 303 lb (137.4 kg)    LMP 06/18/2020    SpO2 99%    BMI 50.42 kg/m       Physical Exam Vitals and nursing note reviewed.  Constitutional:      General: She is not in acute distress.    Appearance: Normal appearance. She is ill-appearing (appears uncomfortable due to cough). She is not toxic-appearing.  HENT:     Head: Normocephalic and atraumatic.     Nose: Congestion and rhinorrhea present.     Mouth/Throat:     Mouth: Mucous membranes are moist.     Pharynx: Oropharynx is clear.  Eyes:     General: No scleral icterus.       Right eye: No discharge.        Left eye: No discharge.  Conjunctiva/sclera: Conjunctivae normal.  Cardiovascular:     Rate and Rhythm: Normal rate and regular rhythm.     Heart sounds: Normal heart sounds.  Pulmonary:     Effort: Pulmonary effort is normal. No respiratory distress.     Breath sounds: Wheezing (few mild scattered wheezes throughout ) present.  Musculoskeletal:     Cervical back: Neck supple.  Skin:    General: Skin is dry.  Neurological:     General: No focal deficit present.     Mental Status: She is alert. Mental status is  at baseline.     Motor: No weakness.     Gait: Gait normal.  Psychiatric:        Mood and Affect: Mood normal.        Behavior: Behavior normal.        Thought Content: Thought content normal.      UC Treatments / Results  Labs (all labs ordered are listed, but only abnormal results are displayed) Labs Reviewed  RESP PANEL BY RT-PCR (RSV, FLU A&B, COVID)  [id number]    EKG   Radiology No results found.  Procedures Procedures (including critical care time)  Medications Ordered in UC Medications - No data to display  Initial Impression / Assessment and Plan / UC Course  I have reviewed the triage vital signs and the nursing notes.  Pertinent labs & imaging results that were available during my care of the patient were reviewed by me and considered in my medical decision making (see chart for details).   Respiratory panel obtained today.  After chest x-ray EKG but patient declined.  On auscultation of chest she has a few mild wheezes scattered throughout.  All vital signs are stable and normal.  She is in no acute distress.  Patient afebrile.  Respiratory panel negative for flu and Covid today.  Advised patient I suspect she has a asthma exacerbation which is causing the chest tightness and likely this is due to a viral illness.  Advised to increase rest and fluid intake.  I have refilled her albuterol nebulizer solution and inhaler for her today.  I also sent prednisone to the pharmacy and Tussionex after reviewing the controlled substance database and finding she is low risk for abuse.  ED precautions thoroughly discussed for any acute worsening of symptoms or if she is not improving over the next several days.  ED precautions discussed for chest pain as well.  She declined any further work-up at this point, but is aware that if things are not getting better or worsening then she needs to be seen again and have further work-up.   Final Clinical Impressions(s) / UC Diagnoses    Final diagnoses:  Asthma with acute exacerbation, unspecified asthma severity, unspecified whether persistent  Cough  Chest tightness  Viral illness     Discharge Instructions     I will call you with results of your respiratory panel.  At this time, go home and rest increase fluid intake.  You can take the Tussionex as needed for cough that is severe.  He is to be having a mild asthma exacerbation so start the prednisone and also refill the albuterol nebulizer and inhaler for you to use as needed.  You should return or go to the emergency department if you start to have any worsening chest pain or breathing difficulty.    ED Prescriptions    Medication Sig Dispense Auth. Provider   albuterol (VENTOLIN HFA) 108 (90 Base) MCG/ACT inhaler  Inhale 2 puffs into the lungs every 6 (six) hours as needed for wheezing or shortness of breath. 1 g Eusebio FriendlyEaves, Mylon Mabey B, PA-C   albuterol (PROVENTIL) (5 MG/ML) 0.5% nebulizer solution Take 0.5 mLs (2.5 mg total) by nebulization every 6 (six) hours as needed for up to 5 days for wheezing or shortness of breath. 20 mL Eusebio FriendlyEaves, Alyx Mcguirk B, PA-C   predniSONE (DELTASONE) 20 MG tablet Take 2 tablets (40 mg total) by mouth daily for 5 days. 10 tablet Shirlee LatchEaves, Fareeda Downard B, PA-C   chlorpheniramine-HYDROcodone (TUSSIONEX PENNKINETIC ER) 10-8 MG/5ML SUER Take 5 mLs by mouth every 12 (twelve) hours as needed for up to 7 days for cough. 70 mL Shirlee LatchEaves, Christhoper Busbee B, PA-C     I have reviewed the PDMP during this encounter.   Shirlee Latchaves, Roseline Ebarb B, PA-C 07/19/20 1411

## 2020-07-31 ENCOUNTER — Telehealth: Payer: Self-pay | Admitting: *Deleted

## 2020-07-31 NOTE — Telephone Encounter (Signed)
    Care Management   Unsuccessful Call Note 07/31/2020 Name: Leah Bright MRN: [id number] DOB: [birth year]  Leah Bright is a 29 year old female who sees Marvell Fuller, FNP for primary care. Marvell Fuller, FNP asked the CCM team to consult the patient for Mental Health Follow up and Resources.     Was unable to reach patient via telephone today to follow up on Medicaid application status. There was no voicemail available to leave a message. (unsuccessful outreach #[email or web address]).   Plan: Will follow-up within 7-14 business days via telephone.      Verna Czech, LCSW Clinical Social Worker  [facility] Care Management [phone]

## 2020-08-06 ENCOUNTER — Encounter: Payer: Self-pay | Admitting: Emergency Medicine

## 2020-08-06 ENCOUNTER — Emergency Department
Admission: EM | Admit: 2020-08-06 | Discharge: 2020-08-06 | Disposition: A | Discharge status: Home / Self Care | Payer: HRSA Program | Attending: Emergency Medicine | Admitting: Emergency Medicine

## 2020-08-06 ENCOUNTER — Other Ambulatory Visit: Payer: Self-pay

## 2020-08-06 ENCOUNTER — Emergency Department: Payer: HRSA Program

## 2020-08-06 DIAGNOSIS — E039 Hypothyroidism, unspecified: Secondary | ICD-10-CM | POA: Diagnosis not present

## 2020-08-06 DIAGNOSIS — M791 Myalgia, unspecified site: Secondary | ICD-10-CM | POA: Diagnosis not present

## 2020-08-06 DIAGNOSIS — E1122 Type 2 diabetes mellitus with diabetic chronic kidney disease: Secondary | ICD-10-CM | POA: Diagnosis not present

## 2020-08-06 DIAGNOSIS — N189 Chronic kidney disease, unspecified: Secondary | ICD-10-CM | POA: Insufficient documentation

## 2020-08-06 DIAGNOSIS — Z7984 Long term (current) use of oral hypoglycemic drugs: Secondary | ICD-10-CM | POA: Insufficient documentation

## 2020-08-06 DIAGNOSIS — J45901 Unspecified asthma with (acute) exacerbation: Secondary | ICD-10-CM | POA: Diagnosis not present

## 2020-08-06 DIAGNOSIS — U071 COVID-19: Secondary | ICD-10-CM | POA: Diagnosis not present

## 2020-08-06 DIAGNOSIS — R519 Headache, unspecified: Secondary | ICD-10-CM | POA: Diagnosis not present

## 2020-08-06 DIAGNOSIS — Z79899 Other long term (current) drug therapy: Secondary | ICD-10-CM | POA: Insufficient documentation

## 2020-08-06 DIAGNOSIS — I129 Hypertensive chronic kidney disease with stage 1 through stage 4 chronic kidney disease, or unspecified chronic kidney disease: Secondary | ICD-10-CM | POA: Diagnosis not present

## 2020-08-06 DIAGNOSIS — H9203 Otalgia, bilateral: Secondary | ICD-10-CM | POA: Diagnosis not present

## 2020-08-06 DIAGNOSIS — R509 Fever, unspecified: Secondary | ICD-10-CM | POA: Diagnosis present

## 2020-08-06 LAB — RESP PANEL BY RT-PCR (FLU A&B, COVID) ARPGX2
Influenza A by PCR: NEGATIVE
Influenza B by PCR: NEGATIVE
SARS Coronavirus 2 by RT PCR: POSITIVE — AB

## 2020-08-06 LAB — GROUP A STREP BY PCR: Group A Strep by PCR: NOT DETECTED

## 2020-08-06 MED ORDER — IBUPROFEN 600 MG PO TABS
600.0000 mg | ORAL_TABLET | Freq: Once | ORAL | Status: AC
  Administered 2020-08-06: 600 mg via ORAL
  Filled 2020-08-06: qty 1

## 2020-08-06 MED ORDER — IBUPROFEN 600 MG PO TABS: 600.0000 mg | ORAL_TABLET | Freq: Four times a day (QID) | ORAL | 0 refills | Status: DC | PRN

## 2020-08-06 MED ORDER — PSEUDOEPH-BROMPHEN-DM 30-2-10 MG/5ML PO SYRP: 5.0000 mL | ORAL_SOLUTION | Freq: Four times a day (QID) | ORAL | 0 refills | Status: DC | PRN

## 2020-08-06 MED ORDER — ACETAMINOPHEN 325 MG PO TABS
650.0000 mg | ORAL_TABLET | Freq: Once | ORAL | Status: AC | PRN
  Administered 2020-08-06: 650 mg via ORAL
  Filled 2020-08-06: qty 2

## 2020-08-06 MED ORDER — ACETAMINOPHEN 500 MG PO TABS: 500.0000 mg | ORAL_TABLET | Freq: Four times a day (QID) | ORAL | 0 refills | Status: DC | PRN

## 2020-08-06 MED ORDER — ONDANSETRON 4 MG PO TBDP
4.0000 mg | ORAL_TABLET | Freq: Once | ORAL | Status: AC
  Administered 2020-08-06: 4 mg via ORAL
  Filled 2020-08-06: qty 1

## 2020-08-06 NOTE — ED Notes (Signed)
PA informed of swab results

## 2020-08-06 NOTE — ED Notes (Signed)
11/2 symptom onset Chest pain, SOB, fever, bilateral ear pain, headache behind eyes Pt reports trip to [place] last week, with symptoms beginning on thursday.

## 2020-08-06 NOTE — ED Notes (Signed)
Pt calm , collective , denied pain . Ambulatory upon discharge

## 2020-08-06 NOTE — ED Triage Notes (Signed)
Patient to ER for c/o generalized body pain, nausea, fever (unknown highest), sore throat, bilateral ear pain, headache since Thursday night. Recently returned from [place].

## 2020-08-06 NOTE — ED Notes (Signed)
Abd pain to [facility], affirms diarrhea

## 2020-08-06 NOTE — ED Provider Notes (Signed)
[facility] Emergency Department Provider Note  ____________________________________________  Time seen: Approximately 2:33 PM  I have reviewed the triage vital signs and the nursing notes.   HISTORY  Chief Complaint Generalized Body Aches, Fever, and Sore Throat    HPI Leah RackKelli K Bright is a 29 y.o. female that presents to the emergency department for evaluation of fever, chills, body aches, headache, bilateral ear pain, sore throat, nonproductive cough for 3 days.  Patient presents with her stepson, who has similar symptoms.  [place] were [place] in [place] last week and returned yesterday.  She has been taking OTC medications without relief.  No shortness of breath, chest pain, vomiting, diarrhea.  Past Medical History:  Diagnosis Date  . Agitation   . Allergy   . Anxiety   . Asthma   . Chronic kidney disease   . Depression   . Diabetes mellitus without complication (HCC)   . High cholesterol   . Hypertension   . Hypothyroidism   . Left renal artery stenosis (HCC) 09/07/2019  . Migraine   . OCD (obsessive compulsive disorder)   . PTSD (post-traumatic stress disorder)   . Renal artery stenosis (HCC)   . Right renal atrophy   . Social anxiety disorder   . Social anxiety disorder   . Social phobia   . Thyroid disease     Patient Active Problem List   Diagnosis Date Noted  . Nausea and vomiting 07/19/2020  . Viral upper respiratory tract infection 07/19/2020  . Suspected COVID-19 virus infection 07/19/2020  . Generalized anxiety disorder 05/21/2020  . Mood disorder (HCC) 01/19/2020  . Elevated liver enzymes 01/19/2020  . Hordeolum externum of left upper eyelid 10/13/2019  . Renal atrophy, right 09/20/2019  . History of diabetes mellitus, type II 09/20/2019  . Asthma, chronic 09/19/2019  . Migraine without aura and without status migrainosus, not intractable 09/18/2019  . Hypothyroidism 08/02/2018  . Essential hypertension 08/02/2018  . Morbid  obesity (HCC) 08/02/2018  . Type 2 diabetes mellitus without complication, without long-term current use of insulin (HCC) 08/02/2018  . 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  . 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/02/2018  . Asthma exacerbation 09/28/2015  . CAP (community acquired pneumonia) 09/27/2015  . Hypokalemia 09/27/2015  . Hypomagnesemia 09/27/2015  . Hypoxia 09/27/2015  . Sepsis due to pneumonia (HCC) 09/27/2015  . Hypertension associated with diabetes (HCC) 09/27/2015  . Abdominal pain 01/22/2013  . Diarrhea 01/22/2013  . Rash 01/22/2013    Past Surgical History:  Procedure Laterality Date  . CHOLECYSTECTOMY  2008  . endoscopy/colonoscopy   2011  . LAPAROSCOPIC GASTRIC BANDING  2007  . LAPAROSCOPIC REPAIR AND REMOVAL OF GASTRIC BAND  2011  . TONSILLECTOMY  1994  . WISDOM TOOTH EXTRACTION      Prior to Admission medications   Medication Sig Start Date End Date Taking? Authorizing Provider  acetaminophen (TYLENOL) 500 MG tablet Take 1 tablet (500 mg total) by mouth every 6 (six) hours as needed. 08/06/20   Enid DerryWagner, Aron Inge, PA-C  albuterol (PROVENTIL) (5 MG/ML) 0.5% nebulizer solution Take 0.5 mLs (2.5 mg total) by nebulization every 6 (six) hours as needed for up to 5 days for wheezing or shortness of breath. 07/19/20 07/24/20  Shirlee LatchEaves, Lesley B, PA-C  albuterol (VENTOLIN HFA) 108 (90 Base) MCG/ACT inhaler Inhale 2 puffs into the lungs every 6 (six) hours as needed for wheezing or shortness of breath. 07/19/20 08/18/20  Eusebio FriendlyEaves, Lesley B, PA-C  amLODipine (NORVASC) 10 MG tablet  Take 1 tablet (10 mg total) by mouth daily. 06/11/20   Flinchum, Eula Fried, FNP  brompheniramine-pseudoephedrine-DM 30-2-10 MG/5ML syrup Take 5 mLs by mouth 4 (four) times daily as needed. 08/06/20   Enid Derry, PA-C  busPIRone (BUSPAR) 5 MG tablet Take 5 mg by mouth 2 (two) times daily.    [provider]  butalbital-acetaminophen-caffeine (FIORICET) 50-325-40 MG tablet Take  1 tablet by mouth every 6 (six) hours as needed for headache. 06/12/20   Anson Fret, MD  ibuprofen (ADVIL) 600 MG tablet Take 1 tablet (600 mg total) by mouth every 6 (six) hours as needed. 08/06/20   Enid Derry, PA-C  levothyroxine (SYNTHROID) 75 MCG tablet Take 1 tablet (75 mcg total) by mouth daily before breakfast. Please go to lab for lab test. 07/18/20   Flinchum, Eula Fried, FNP  lisinopril (ZESTRIL) 40 MG tablet Take 40 mg by mouth daily.    [provider]  meloxicam (MOBIC) 15 MG tablet Take 1 tablet (15 mg total) by mouth daily. Patient not taking: Reported on 07/19/2020 05/22/20   Cuthriell, Delorise Royals, PA-C  metFORMIN (GLUCOPHAGE-XR) 500 MG 24 hr tablet TAKE 4 TABLETS BY MOUTH ONCE DAILY 07/14/20   Flinchum, Eula Fried, FNP  metoprolol succinate (TOPROL XL) 25 MG 24 hr tablet Take 1 tablet (25 mg total) by mouth daily. Check heart rate prior and do not take if heart rate is less than 60 beats per minute. 05/21/20   Flinchum, Eula Fried, FNP  norethindrone (MICRONOR) 0.35 MG tablet Take 1 tablet (0.35 mg total) by mouth daily. 07/27/19   Schuman, Christanna R, MD  ondansetron (ZOFRAN-ODT) 4 MG disintegrating tablet Take 1-2 tablets (4-8 mg total) by mouth every 8 (eight) hours as needed for nausea. 09/15/19   Anson Fret, MD  traZODone (DESYREL) 100 MG tablet Take 100 mg by mouth at bedtime as needed for sleep.    [provider]  venlafaxine XR (EFFEXOR-XR) 75 MG 24 hr capsule Take 75 mg by mouth at bedtime.    [provider]    Allergies Toradol [ketorolac tromethamine], Codeine, Other, Pepto-bismol [bismuth], and Tramadol  Family History  Problem Relation Age of Onset  . Cancer Mother        brain tumor  . Hypertension Mother   . Depression Mother   . Diabetes Mother   . Migraines Mother        benign tumor, still gets headaches sometimes now   . Anxiety disorder Father   . Depression Father   . Hypertension Maternal Grandmother   .  Thyroid disease Maternal Grandmother   . Diabetes Maternal Grandmother   . Hypertension Maternal Grandfather   . Cancer Maternal Grandfather        myositis  . High blood pressure Maternal Grandfather   . Diabetes Maternal Grandfather   . Stroke Maternal Grandfather     Social History Social History   Tobacco Use  . Smoking status: Never Smoker  . Smokeless tobacco: Never Used  Vaping Use  . Vaping Use: Never used  Substance Use Topics  . Alcohol use: Yes    Comment: rarely, maybe 1 shot every 2-3 months  . Drug use: Never     Review of Systems  Constitutional: Positive for fever. ENT: Positive for ear pain, sore throat, nasal congestion Cardiovascular: No chest pain. Respiratory: Positive for cough. No SOB. Gastrointestinal: No abdominal pain.  No nausea, no vomiting.  Musculoskeletal: Positive for body aches. Skin: Negative for rash, abrasions, lacerations,  ecchymosis. Neurological: Positive for headache.   ____________________________________________   PHYSICAL EXAM:  VITAL SIGNS: ED Triage Vitals  Enc Vitals Group     BP 08/06/20 1149 (!) 108/51     Pulse Rate 08/06/20 1149 (!) 108     Resp 08/06/20 1149 20     Temp 08/06/20 1149 (!) 101.6 F (38.7 C)     Temp Source 08/06/20 1149 Oral     SpO2 08/06/20 1149 96 %     Weight 08/06/20 1135 300 lb (136.1 kg)     Height 08/06/20 1135 5\' 5"  (1.651 m)     Head Circumference --      Peak Flow --      Pain Score 08/06/20 1135 8     Pain Loc --      Pain Edu? --      Excl. in GC? --      Constitutional: Alert and oriented. Well appearing and in no acute distress. Eyes: Conjunctivae are normal. PERRL. EOMI. Head: Atraumatic. ENT:      Ears: Tympanic membranes are pearly.      Nose: Moderate congestion/rhinnorhea.      Mouth/Throat: Mucous membranes are moist.  Oropharynx nonerythematous.  Tonsils not enlarged. Neck: No stridor.   Cardiovascular: Normal rate, regular rhythm.  Good peripheral  circulation. Respiratory: Normal respiratory effort without tachypnea or retractions. Lungs CTAB. Good air entry to the bases with no decreased or absent breath sounds. Gastrointestinal: Bowel sounds 4 quadrants. Soft and nontender to palpation. No guarding or rigidity. No palpable masses. No distention.  Musculoskeletal: Full range of motion to all extremities. No gross deformities appreciated. Neurologic:  Normal speech and language. No gross focal neurologic deficits are appreciated.  Skin:  Skin is warm, dry and intact. No rash noted. Psychiatric: Mood and affect are normal. Speech and behavior are normal. Patient exhibits appropriate insight and judgement.   ____________________________________________   LABS (all labs ordered are listed, but only abnormal results are displayed)  Labs Reviewed  RESP PANEL BY RT-PCR (FLU A&B, COVID) ARPGX2 - Abnormal; Notable for the following components:      Result Value   SARS Coronavirus 2 by RT PCR POSITIVE (*)    All other components within normal limits  GROUP A STREP BY PCR   ____________________________________________  EKG   ____________________________________________  RADIOLOGY 14/06/21, personally viewed and evaluated these images (plain radiographs) as part of my medical decision making, as well as reviewing the written report by the radiologist.  DG Chest 1 View  Result Date: 08/06/2020 CLINICAL DATA:  cough, covid EXAM: CHEST  1 VIEW COMPARISON:  05/22/2020 chest radiograph and prior. FINDINGS: Hypoinflated lungs. No focal consolidation, pneumothorax or pleural effusion. Cardiomediastinal silhouette within normal limits. No acute osseous abnormality. IMPRESSION: No focal airspace disease. Electronically Signed   By: 05/24/2020 M.D.   On: 08/06/2020 14:57    ____________________________________________    PROCEDURES  Procedure(s) performed:    Procedures    Medications  acetaminophen (TYLENOL)  tablet 650 mg (650 mg Oral Given 08/06/20 1151)  ibuprofen (ADVIL) tablet 600 mg (600 mg Oral Given 08/06/20 1616)  ondansetron (ZOFRAN-ODT) disintegrating tablet 4 mg (4 mg Oral Given 08/06/20 1616)     ____________________________________________   INITIAL IMPRESSION / ASSESSMENT AND PLAN / ED COURSE  Pertinent labs & imaging results that were available during my care of the patient were reviewed by me and considered in my medical decision making (see chart for details).  Review of the [employer] [place]  CSRS was performed in accordance of the [facility] prior to dispensing any controlled drugs.   Patient's diagnosis is consistent with Covid 19.  COVID-19 test is positive.  Chest x-ray negative for acute cardiopulmonary processes. Patient will be discharged home with prescriptions for bromfed, tylenol, ibuprofen. Patient is to follow up with PCP as directed. Patient is given ED precautions to return to the ED for any worsening or new symptoms.   Leah Bright was evaluated in Emergency Department on 08/06/2020 for the symptoms described in the history of present illness. She was evaluated in the context of the global COVID-19 pandemic, which necessitated consideration that the patient might be at risk for infection with the SARS-CoV-2 virus that causes COVID-19. Institutional protocols and algorithms that pertain to the evaluation of patients at risk for COVID-19 are in a state of rapid change based on information released by regulatory bodies including the CDC and federal and state organizations. These policies and algorithms were followed during the patient's care in the ED.  ____________________________________________  FINAL CLINICAL IMPRESSION(S) / ED DIAGNOSES  Final diagnoses:  COVID-19      NEW MEDICATIONS STARTED DURING THIS VISIT:  ED Discharge Orders         Ordered    ibuprofen (ADVIL) 600 MG tablet  Every 6 hours PRN        08/06/20 1654    acetaminophen (TYLENOL) 500 MG tablet  Every 6  hours PRN        08/06/20 1654    brompheniramine-pseudoephedrine-DM 30-2-10 MG/5ML syrup  4 times daily PRN        08/06/20 1654              This chart was dictated using voice recognition software/Dragon. Despite best efforts to proofread, errors can occur which can change the meaning. Any change was purely unintentional.    Enid Derry, PA-C 08/06/20 1829    Delton Prairie, MD 08/07/20 [phone]

## 2020-08-07 ENCOUNTER — Telehealth: Payer: Self-pay | Admitting: Nurse Practitioner

## 2020-08-07 ENCOUNTER — Encounter: Payer: Self-pay | Admitting: Nurse Practitioner

## 2020-08-07 DIAGNOSIS — U071 COVID-19: Secondary | ICD-10-CM

## 2020-08-07 NOTE — Telephone Encounter (Signed)
Called to Discuss with patient about Covid symptoms and the use of a monoclonal antibody infusion for those with mild to moderate Covid symptoms and at a high risk of hospitalization.     Pt is qualified for this infusion at the [facility] infusion center due to co-morbid conditions and/or a member of an at-risk group.     Unable to reach pt. Left message to return call. Sent mychart message.   Magaline Steinberg, DNP, AGNP-C [phone] (Infusion Center Hotline)  

## 2020-08-09 ENCOUNTER — Telehealth: Payer: Self-pay | Admitting: *Deleted

## 2020-08-09 ENCOUNTER — Telehealth: Payer: Self-pay

## 2020-08-09 NOTE — Telephone Encounter (Signed)
   Chronic Care Management   Unsuccessful Call Note 08/09/2020 Name: Leah Bright MRN: [id number] DOB: 1991-02-12  Patient is a 29 year old female who sees Marvell Fuller, FNP for primary care. Toya Smothers FMP asked the CCM team to consult the patient for [employer].     This Child psychotherapist was not able to reach patient for follow up call. Patient's chart reviewed in Epic.  I have left HIPAA compliant voicemail asking patient to return my call. (unsuccessful outreach #[phone]).   Plan: Will follow-up within 14 business days via telephone.      Verna Czech, LCSW Clinical Social Worker  [facility] Care Management [phone]

## 2020-08-11 ENCOUNTER — Other Ambulatory Visit: Payer: Self-pay | Admitting: Oncology

## 2020-08-11 ENCOUNTER — Encounter: Payer: Self-pay | Admitting: Oncology

## 2020-08-11 DIAGNOSIS — U071 COVID-19: Secondary | ICD-10-CM

## 2020-08-11 NOTE — Progress Notes (Signed)
I connected by phone with  Mrs. Flom to discuss the potential use of an new treatment for mild to moderate COVID-19 viral infection in non-hospitalized patients.   This patient is a age/sex that meets the FDA criteria for Emergency Use Authorization of casirivimab\imdevimab.  Has a (+) direct SARS-CoV-2 viral test result 1. Has mild or moderate COVID-19  2. Is ? 31 years of age and weighs ? 40 kg 3. Is NOT hospitalized due to COVID-19 4. Is NOT requiring oxygen therapy or requiring an increase in baseline oxygen flow rate due to COVID-19 5. Is within 10 days of symptom onset 6. Has at least one of the high risk factor(s) for progression to severe COVID-19 and/or hospitalization as defined in EUA. Specific high risk criteria : Past Medical History:  Diagnosis Date  . Agitation   . Allergy   . Anxiety   . Asthma   . Chronic kidney disease   . Depression   . Diabetes mellitus without complication (HCC)   . High cholesterol   . Hypertension   . Hypothyroidism   . Left renal artery stenosis (HCC) 09/07/2019  . Migraine   . OCD (obsessive compulsive disorder)   . PTSD (post-traumatic stress disorder)   . Renal artery stenosis (HCC)   . Right renal atrophy   . Social anxiety disorder   . Social anxiety disorder   . Social phobia   . Thyroid disease   ?  ?    Symptom onset  08/04/2020   I have spoken and communicated the following to the patient or parent/caregiver:   1. FDA has authorized the emergency use of casirivimab\imdevimab for the treatment of mild to moderate COVID-19 in adults and pediatric patients with positive results of direct SARS-CoV-2 viral testing who are 89 years of age and older weighing at least 40 kg, and who are at high risk for progressing to severe COVID-19 and/or hospitalization.   2. The significant known and potential risks and benefits of casirivimab\imdevimab, and the extent to which such potential risks and benefits are unknown.   3. Information  on available alternative treatments and the risks and benefits of those alternatives, including clinical trials.   4. Patients treated with casirivimab\imdevimab should continue to self-isolate and use infection control measures (e.g., wear mask, isolate, social distance, avoid sharing personal items, clean and disinfect "high touch" surfaces, and frequent handwashing) according to CDC guidelines.    5. The patient or parent/caregiver has the option to accept or refuse casirivimab\imdevimab .   After reviewing this information with the patient, The patient agreed to proceed with receiving casirivimab\imdevimab infusion and will be provided a copy of the Fact sheet prior to receiving the infusion.Mignon Pine, AGNP-C [phone] (Infusion Center Hotline)

## 2020-08-13 ENCOUNTER — Ambulatory Visit: Payer: Self-pay | Admitting: *Deleted

## 2020-08-13 ENCOUNTER — Ambulatory Visit (HOSPITAL_COMMUNITY)
Admission: RE | Admit: 2020-08-13 | Discharge: 2020-08-13 | Disposition: A | Discharge status: Home / Self Care | Payer: HRSA Program | Source: Ambulatory Visit | Attending: Pulmonary Disease | Admitting: Pulmonary Disease

## 2020-08-13 DIAGNOSIS — U071 COVID-19: Secondary | ICD-10-CM | POA: Diagnosis not present

## 2020-08-13 MED ORDER — ALBUTEROL SULFATE HFA 108 (90 BASE) MCG/ACT IN AERS: 2.0000 | INHALATION_SPRAY | Freq: Once | RESPIRATORY_TRACT | Status: DC | PRN

## 2020-08-13 MED ORDER — SODIUM CHLORIDE 0.9 % IV SOLN: INTRAVENOUS | Status: DC | PRN

## 2020-08-13 MED ORDER — METHYLPREDNISOLONE SODIUM SUCC 125 MG IJ SOLR: 125.0000 mg | Freq: Once | INTRAMUSCULAR | Status: DC | PRN

## 2020-08-13 MED ORDER — DIPHENHYDRAMINE HCL 50 MG/ML IJ SOLN: 50.0000 mg | Freq: Once | INTRAMUSCULAR | Status: DC | PRN

## 2020-08-13 MED ORDER — SODIUM CHLORIDE 0.9 % IV BOLUS
1000.0000 mL | Freq: Once | INTRAVENOUS | Status: AC
  Administered 2020-08-13: 1000 mL via INTRAVENOUS

## 2020-08-13 MED ORDER — FAMOTIDINE IN NACL 20-0.9 MG/50ML-% IV SOLN: 20.0000 mg | Freq: Once | INTRAVENOUS | Status: DC | PRN

## 2020-08-13 MED ORDER — EPINEPHRINE 0.3 MG/0.3ML IJ SOAJ: 0.3000 mg | Freq: Once | INTRAMUSCULAR | Status: DC | PRN

## 2020-08-13 MED ORDER — SODIUM CHLORIDE 0.9 % IV SOLN: Freq: Once | INTRAVENOUS | Status: AC

## 2020-08-13 NOTE — Telephone Encounter (Signed)
See below notes.   She is scheduled for the monoclonal antibody infusion today at [facility] at 10:30.   She is wondering if she can take the infusion since she has kidney problems.   She said a nurse  Has not called her and asked her any questions.  I gave her the number for the infusion clinic so she could get the answer to her question. She thanked me for my help.    Reason for Disposition . [1] COVID-19 diagnosed by positive lab test AND [2] mild symptoms (e.g., cough, fever, others) AND [3] no complications or SOB    She wants to know if it's ok to have the monoclonal antibody infusion since she has kidney problems.  Answer Assessment - Initial Assessment Questions 1. COVID-19 DIAGNOSIS: "Who made your Coronavirus (COVID-19) diagnosis?" "Was it confirmed by a positive lab test?" If not diagnosed by a HCP, ask "Are there lots of cases (community spread) where you live?" (See public health department website, if unsure)     I have COVID.  Can I get the monoclonal antibody infusion?   She went to ED Monday last week.   Tested for COVID it's positive.   Gave her Motrin for the fever.   She's been in bed for a week.   The NP sent a message through MyChart regarding the monoclonal infusion.    They set her up for the treatment today at 10:30 at [facility].  She has kidney problems.  Is it ok for me to get the infusion? 2. COVID-19 EXPOSURE: "Was there any known exposure to COVID before the symptoms began?" CDC Definition of close contact: within 6 feet (2 meters) for a total of 15 minutes or more over a 24-hour period.      See above note 3. ONSET: "When did the COVID-19 symptoms start?"      *No Answer* 4. WORST SYMPTOM: "What is your worst symptom?" (e.g., cough, fever, shortness of breath, muscle aches)     *No Answer* 5. COUGH: "Do you have a cough?" If Yes, ask: "How bad is the cough?"       *No Answer* 6. FEVER: "Do you have a fever?" If Yes, ask: "What is your  temperature, how was it measured, and when did it start?"     *No Answer* 7. RESPIRATORY STATUS: "Describe your breathing?" (e.g., shortness of breath, wheezing, unable to speak)      *No Answer* 8. BETTER-SAME-WORSE: "Are you getting better, staying the same or getting worse compared to yesterday?"  If getting worse, ask, "In what way?"     *No Answer* 9. HIGH RISK DISEASE: "Do you have any chronic medical problems?" (e.g., asthma, heart or lung disease, weak immune system, obesity, etc.)     *No Answer* 10. PREGNANCY: "Is there any chance you are pregnant?" "When was your last menstrual period?"       *No Answer* 11. OTHER SYMPTOMS: "Do you have any other symptoms?"  (e.g., chills, fatigue, headache, loss of smell or taste, muscle pain, sore throat; new loss of smell or taste especially support the diagnosis of COVID-19)       *No Answer*  Protocols used: CORONAVIRUS (COVID-19) DIAGNOSED OR SUSPECTED-A-AH

## 2020-08-13 NOTE — Progress Notes (Addendum)
Patient reviewed Fact Sheet for Patients, Parents, and Caregivers for Emergency Use Authorization (EUA) of Bamlanivimab/etesevimab for the Treatment of Coronavirus.  Patient also reviewed and is agreeable to the estimated cost of treatment.  Patient is agreeable to proceed.

## 2020-08-13 NOTE — Discharge Instructions (Signed)
10 Things You Can Do to Manage Your COVID-19 Symptoms at Home If you have possible or confirmed COVID-19: 1. Stay home from work and school. And stay away from other public places. If you must go out, avoid using any kind of public transportation, ridesharing, or taxis. 2. Monitor your symptoms carefully. If your symptoms get worse, call your healthcare provider immediately. 3. Get rest and stay hydrated. 4. If you have a medical appointment, call the healthcare provider ahead of time and tell them that you have or may have COVID-19. 5. For medical emergencies, call 911 and notify the dispatch personnel that you have or may have COVID-19. 6. Cover your cough and sneezes with a tissue or use the inside of your elbow. 7. Wash your hands often with soap and water for at least 20 seconds or clean your hands with an alcohol-based hand sanitizer that contains at least 60% alcohol. 8. As much as possible, stay in a specific room and away from other people in your home. Also, you should use a separate bathroom, if available. If you need to be around other people in or outside of the home, wear a mask. 9. Avoid sharing personal items with other people in your household, like dishes, towels, and bedding. 10. Clean all surfaces that are touched often, like counters, tabletops, and doorknobs. Use household cleaning sprays or wipes according to the label instructions. cdc.gov/coronavirus 03/02/2019 This information is not intended to replace advice given to you by your health care provider. Make sure you discuss any questions you have with your health care provider. Document Revised: 08/04/2019 Document Reviewed: 08/04/2019 Elsevier Patient Education  2020 Elsevier Inc. What types of side effects do monoclonal antibody drugs cause?  Common side effects  In general, the more common side effects caused by monoclonal antibody drugs include: . Allergic reactions, such as hives or itching . Flu-like signs and  symptoms, including chills, fatigue, fever, and muscle aches and pains . Nausea, vomiting . Diarrhea . Skin rashes . Low blood pressure   The CDC is recommending patients who receive monoclonal antibody treatments wait at least 90 days before being vaccinated.  Currently, there are no data on the safety and efficacy of mRNA COVID-19 vaccines in persons who received monoclonal antibodies or convalescent plasma as part of COVID-19 treatment. Based on the estimated half-life of such therapies as well as evidence suggesting that reinfection is uncommon in the 90 days after initial infection, vaccination should be deferred for at least 90 days, as a precautionary measure until additional information becomes available, to avoid interference of the antibody treatment with vaccine-induced immune responses. If you have any questions or concerns after the infusion please call the Advanced Practice Provider on call at [phone]. This number is ONLY intended for your use regarding questions or concerns about the infusion post-treatment side-effects.  Please do not provide this number to others for use. For return to work notes please contact your primary care provider.   If someone you know is interested in receiving treatment please have them call the COVID hotline at [phone].   

## 2020-08-13 NOTE — Progress Notes (Addendum)
Notes: Pt complained of chest pain after coughing.  Palpated intercostal muscle, which reproduced discomfort.  Pt had coughing episodes with which she experienced some shortness of breath; states she did not utilize her inhaler or nebulizer today. Stated she felt like she had a bit of pneumonia; auscultated lungs- clear sounds.  Pt felt okay to go home but indicated understanding to call for immediate medical help if she has difficulty breathing.  Will take Tylenol for body/muscular discomfort.  Diagnosis: COVID-19  Physician: Dr. Shan Levans  Procedure:   Medication fact sheet provided to patient; all questions answered.  Allergies reviewed with patient.  IV placed.  Bamlanivimab/etesevimab administered via IV infusion.  Complications: No immediate complications noted.  Discharge: Discharged home   Gregary Signs 08/13/2020

## 2020-08-15 DIAGNOSIS — Z0289 Encounter for other administrative examinations: Secondary | ICD-10-CM

## 2020-08-16 ENCOUNTER — Ambulatory Visit
Admission: RE | Admit: 2020-08-16 | Discharge: 2020-08-16 | Disposition: A | Discharge status: Home / Self Care | Payer: HRSA Program | Source: Ambulatory Visit | Attending: Physician Assistant | Admitting: Physician Assistant

## 2020-08-16 ENCOUNTER — Telehealth (INDEPENDENT_AMBULATORY_CARE_PROVIDER_SITE_OTHER): Payer: HRSA Program | Admitting: Physician Assistant

## 2020-08-16 ENCOUNTER — Ambulatory Visit: Payer: Self-pay | Admitting: Adult Health

## 2020-08-16 ENCOUNTER — Other Ambulatory Visit: Payer: Self-pay

## 2020-08-16 DIAGNOSIS — U071 COVID-19: Secondary | ICD-10-CM

## 2020-08-16 DIAGNOSIS — R0602 Shortness of breath: Secondary | ICD-10-CM | POA: Diagnosis not present

## 2020-08-16 MED ORDER — PREDNISONE 10 MG PO TABS
ORAL_TABLET | ORAL | 0 refills | Status: DC
Start: 2020-08-16 — End: 2020-10-19

## 2020-08-16 NOTE — Progress Notes (Signed)
MyChart Video Visit    Virtual Visit via Video Note   This visit type was conducted due to national recommendations for restrictions regarding the COVID-19 Pandemic (e.g. social distancing) in an effort to limit this patient's exposure and mitigate transmission in our community. This patient is at least at moderate risk for complications without adequate follow up. This format is felt to be most appropriate for this patient at this time. Physical exam was limited by quality of the video and audio technology used for the visit.   Patient location: Home Provider location: Office   I discussed the limitations of evaluation and management by telemedicine and the availability of in person appointments. The patient expressed understanding and agreed to proceed.  Patient: Leah Bright   DOB: 1991/05/31   29 y.o. Female  MRN: [id number] Visit Date: 08/16/2020  Today's healthcare provider: Trey Sailors, PA-C   Chief Complaint  Patient presents with  . Covid Positive   Subjective    HPI   Patient presents today with COVID. She tested positive on 08/06/2020 and felt very poorly with fevers, cough. She was diagnosed in the ER on 08/06/2020 and was febrile at the time. Her CXR was negative for acute disease. She underwent infusion treatment on 08/13/2020. She reports she has been resting and watching her oxygen. Her O2 sats have been at 88-96% fluctuating. She has been using albuterol with mild relief. She reports it hurts to take a deep breath. She reports SOB just casually walking around.     Medications: Outpatient Medications Prior to Visit  Medication Sig  . acetaminophen (TYLENOL) 500 MG tablet Take 1 tablet (500 mg total) by mouth every 6 (six) hours as needed.  [name] albuterol (PROVENTIL) (5 MG/ML) 0.5% nebulizer solution Take 0.5 mLs (2.5 mg total) by nebulization every 6 (six) hours as needed for up to 5 days for wheezing or shortness of breath.  [name] albuterol (VENTOLIN HFA) 108 (90  Base) MCG/ACT inhaler Inhale 2 puffs into the lungs every 6 (six) hours as needed for wheezing or shortness of breath.  [name] amLODipine (NORVASC) 10 MG tablet Take 1 tablet (10 mg total) by mouth daily.  . brompheniramine-pseudoephedrine-DM 30-2-10 MG/5ML syrup Take 5 mLs by mouth 4 (four) times daily as needed.  . busPIRone (BUSPAR) 5 MG tablet Take 5 mg by mouth 2 (two) times daily.  . butalbital-acetaminophen-caffeine (FIORICET) 50-325-40 MG tablet Take 1 tablet by mouth every 6 (six) hours as needed for headache.  . ibuprofen (ADVIL) 600 MG tablet Take 1 tablet (600 mg total) by mouth every 6 (six) hours as needed.  [name] levothyroxine (SYNTHROID) 75 MCG tablet Take 1 tablet (75 mcg total) by mouth daily before breakfast. Please go to lab for lab test.  . lisinopril (ZESTRIL) 40 MG tablet Take 40 mg by mouth daily.  . meloxicam (MOBIC) 15 MG tablet Take 1 tablet (15 mg total) by mouth daily. (Patient not taking: Reported on 07/19/2020)  . metFORMIN (GLUCOPHAGE-XR) 500 MG 24 hr tablet TAKE 4 TABLETS BY MOUTH ONCE DAILY  . metoprolol succinate (TOPROL XL) 25 MG 24 hr tablet Take 1 tablet (25 mg total) by mouth daily. Check heart rate prior and do not take if heart rate is less than 60 beats per minute.  . norethindrone (MICRONOR) 0.35 MG tablet Take 1 tablet (0.35 mg total) by mouth daily.  . ondansetron (ZOFRAN-ODT) 4 MG disintegrating tablet Take 1-2 tablets (4-8 mg total) by mouth every 8 (eight) hours as needed  for nausea.  . traZODone (DESYREL) 100 MG tablet Take 100 mg by mouth at bedtime as needed for sleep.  [name] venlafaxine XR (EFFEXOR-XR) 75 MG 24 hr capsule Take 75 mg by mouth at bedtime.   No facility-administered medications prior to visit.    Review of Systems    Objective    There were no vitals taken for this visit.   Physical Exam Constitutional:      Appearance: She is ill-appearing.  Pulmonary:     Comments: Patient occasionally pauses between sentences though she is able to  complete full sentences.  Neurological:     Mental Status: She is alert.        Assessment & Plan     1. COVID-19  Patient diagnosed with COVID 08/06/2020 at risk for severe disease due to comorbid conditions including morbid obesity, asthma, diabetes, and HTN. She is 10 days from diagnosis and had MAB infusion on 08/13/2020 without much improvement on symptoms. Now with hypoxia ranging 88%-96% O2. Counseled patient it would be reasonable to proceed to ER for further evaluation. They defer currently, would like to get CXR outpatient. Will order CXR and start on prednisone taper. May consider abx, though a PNA would likely be from COVID.   Counseled on strict return precautions, concerning for impending respiratory failure.   - DG Chest 2 View; Future - predniSONE (DELTASONE) 10 MG tablet; Take 6 pills on day 1, 5 pills on day 2 and so on until complete.  Dispense: 21 tablet; Refill: 0  2. SOB (shortness of breath)    No follow-ups on file.     I discussed the assessment and treatment plan with the patient. The patient was provided an opportunity to ask questions and all were answered. The patient agreed with the plan and demonstrated an understanding of the instructions.   The patient was advised to call back or seek an in-person evaluation if the symptoms worsen or if the condition fails to improve as anticipated.  I spent 30 minutes dedicated to the care of this patient on the date of this encounter to include pre-visit review of records, face-to-face time with the patient discussing COVID, and post visit ordering of testing.  ITrey Sailors, PA-C, have reviewed all documentation for this visit. The documentation on 08/16/20 for the exam, diagnosis, procedures, and orders are all accurate and complete.  The entirety of the information documented in the History of Present Illness, Review of Systems and Physical Exam were personally obtained by me. Portions of this information  were initially documented by [facility] and reviewed by me for thoroughness and accuracy.    Maryella Shivers [facility] [phone] (phone) [phone] (fax)  [facility]

## 2020-08-17 ENCOUNTER — Emergency Department: Payer: HRSA Program

## 2020-08-17 ENCOUNTER — Telehealth: Payer: Self-pay | Admitting: Adult Health

## 2020-08-17 ENCOUNTER — Emergency Department
Admission: EM | Admit: 2020-08-17 | Discharge: 2020-08-17 | Disposition: A | Discharge status: Home / Self Care | Payer: HRSA Program | Attending: Emergency Medicine | Admitting: Emergency Medicine

## 2020-08-17 ENCOUNTER — Telehealth: Payer: Self-pay

## 2020-08-17 ENCOUNTER — Other Ambulatory Visit: Payer: Self-pay

## 2020-08-17 DIAGNOSIS — E039 Hypothyroidism, unspecified: Secondary | ICD-10-CM | POA: Diagnosis not present

## 2020-08-17 DIAGNOSIS — N189 Chronic kidney disease, unspecified: Secondary | ICD-10-CM | POA: Diagnosis not present

## 2020-08-17 DIAGNOSIS — U071 COVID-19: Secondary | ICD-10-CM | POA: Insufficient documentation

## 2020-08-17 DIAGNOSIS — R0602 Shortness of breath: Secondary | ICD-10-CM | POA: Diagnosis present

## 2020-08-17 DIAGNOSIS — J45901 Unspecified asthma with (acute) exacerbation: Secondary | ICD-10-CM | POA: Insufficient documentation

## 2020-08-17 DIAGNOSIS — R0789 Other chest pain: Secondary | ICD-10-CM | POA: Insufficient documentation

## 2020-08-17 DIAGNOSIS — I129 Hypertensive chronic kidney disease with stage 1 through stage 4 chronic kidney disease, or unspecified chronic kidney disease: Secondary | ICD-10-CM | POA: Diagnosis not present

## 2020-08-17 DIAGNOSIS — Z7984 Long term (current) use of oral hypoglycemic drugs: Secondary | ICD-10-CM | POA: Insufficient documentation

## 2020-08-17 DIAGNOSIS — U099 Post covid-19 condition, unspecified: Secondary | ICD-10-CM

## 2020-08-17 DIAGNOSIS — E1122 Type 2 diabetes mellitus with diabetic chronic kidney disease: Secondary | ICD-10-CM | POA: Diagnosis not present

## 2020-08-17 DIAGNOSIS — Z79899 Other long term (current) drug therapy: Secondary | ICD-10-CM | POA: Insufficient documentation

## 2020-08-17 LAB — TROPONIN I (HIGH SENSITIVITY): Troponin I (High Sensitivity): 5 ng/L (ref <18)

## 2020-08-17 LAB — BASIC METABOLIC PANEL
Anion gap: 13 (ref 5–15)
BUN: 11 mg/dL (ref 6–20)
CO2: 21 mmol/L — ABNORMAL LOW (ref 22–32)
Calcium: 9.6 mg/dL (ref 8.9–10.3)
Chloride: 105 mmol/L (ref 98–111)
Creatinine, Ser: 0.62 mg/dL (ref 0.44–1.00)
GFR, Estimated: >60 mL/min (ref >60)
Glucose, Bld: 246 mg/dL — ABNORMAL HIGH (ref 70–99)
Potassium: 4.2 mmol/L (ref 3.5–5.1)
Sodium: 139 mmol/L (ref 135–145)

## 2020-08-17 LAB — CBC
HCT: 45 % (ref 36.0–46.0)
Hemoglobin: 15.2 g/dL — ABNORMAL HIGH (ref 12.0–15.0)
MCH: 26.9 pg (ref 26.0–34.0)
MCHC: 33.8 g/dL (ref 30.0–36.0)
MCV: 79.5 fL — ABNORMAL LOW (ref 80.0–100.0)
Platelets: 353 10*3/uL (ref 150–400)
RBC: 5.66 MIL/uL — ABNORMAL HIGH (ref 3.87–5.11)
RDW: 13.6 % (ref 11.5–15.5)
WBC: 4.4 10*3/uL (ref 4.0–10.5)
nRBC: 0 % (ref 0.0–0.2)

## 2020-08-17 NOTE — ED Triage Notes (Signed)
Pt here via POV from home/  Pt here after being diagnosed with pneumonia after a chest x-ray at urgent care, pt was diagnosed with covid 12/6. States she has felt fine until a few days ago, she has now been havingchest pain, shortness of breath even with rest, states she can't catch her breath sometimes. Pt reports fevers at home when first diagnosed with covid but none recently.

## 2020-08-17 NOTE — Telephone Encounter (Signed)
Called patient and no answer, [facility] call if patient calls back okay for [facility] to advise.

## 2020-08-17 NOTE — Telephone Encounter (Signed)
-----   Message from Trey Sailors, [place] sent at 08/16/2020  4:32 PM EST ----- CXR shows bilateral multifocal pneumonia and low lung volumes consistent with COVID. I do not think anitbiotics will be helpful at this point. My best advice would be for her to be seen in the ER as she is having some trouble breathing and oxygen saturations are fluctuating from high 80s to low 90s. Can we please call patient? Thanks.

## 2020-08-17 NOTE — ED Provider Notes (Signed)
[facility] Emergency Department Provider Note   ____________________________________________   Event Date/Time   First MD Initiated Contact with Patient 08/17/20 1256     (approximate)  I have reviewed the triage vital signs and the nursing notes.   HISTORY  Chief Complaint Pneumonia    HPI Leah Bright is a 29 y.o. female with a stated past medical history of anxiety/depression, type 2 diabetes, chronic kidney disease, OCD, PTSD, and migraines who presents for shortness of breath.  Patient states that she was initially diagnosed with Covid on 12/6 and has had worsening shortness of breath since onset.  Patient was told by the urgent care physician yesterday that she had "bilateral pneumonia".  Patient states that she also has associated intermittent chest tightness that is worse with exertion and partially relieved at rest.  Patient states that she has been using her home breathing treatments including albuterol inhaler and duo nebs through a nebulizer that slightly helped this shortness of breath but it immediately recurs.  Patient has had monoclonal antibody infusion already and is currently on a course of prednisone.  Patient currently denies any vision changes, tinnitus, difficulty speaking, facial droop, sore throat, abdominal pain, nausea/vomiting/diarrhea, dysuria, or weakness/numbness/paresthesias in any extremity         Past Medical History:  Diagnosis Date  . Agitation   . Allergy   . Anxiety   . Asthma   . Chronic kidney disease   . Depression   . Diabetes mellitus without complication (HCC)   . High cholesterol   . Hypertension   . Hypothyroidism   . Left renal artery stenosis (HCC) 09/07/2019  . Migraine   . OCD (obsessive compulsive disorder)   . PTSD (post-traumatic stress disorder)   . Renal artery stenosis (HCC)   . Right renal atrophy   . Social anxiety disorder   . Social anxiety disorder   . Social phobia   . Thyroid  disease     Patient Active Problem List   Diagnosis Date Noted  . Nausea and vomiting 07/19/2020  . Viral upper respiratory tract infection 07/19/2020  . Suspected COVID-19 virus infection 07/19/2020  . Generalized anxiety disorder 05/21/2020  . Mood disorder (HCC) 01/19/2020  . Elevated liver enzymes 01/19/2020  . Hordeolum externum of left upper eyelid 10/13/2019  . Renal atrophy, right 09/20/2019  . History of diabetes mellitus, type II 09/20/2019  . Asthma, chronic 09/19/2019  . Migraine without aura and without status migrainosus, not intractable 09/18/2019  . Hypothyroidism 08/02/2018  . Essential hypertension 08/02/2018  . Morbid obesity (HCC) 08/02/2018  . Type 2 diabetes mellitus without complication, without long-term current use of insulin (HCC) 08/02/2018  . 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  . 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/02/2018  . Asthma exacerbation 09/28/2015  . CAP (community acquired pneumonia) 09/27/2015  . Hypokalemia 09/27/2015  . Hypomagnesemia 09/27/2015  . Hypoxia 09/27/2015  . Sepsis due to pneumonia (HCC) 09/27/2015  . Hypertension associated with diabetes (HCC) 09/27/2015  . Abdominal pain 01/22/2013  . Diarrhea 01/22/2013  . Rash 01/22/2013    Past Surgical History:  Procedure Laterality Date  . CHOLECYSTECTOMY  2008  . endoscopy/colonoscopy   2011  . LAPAROSCOPIC GASTRIC BANDING  2007  . LAPAROSCOPIC REPAIR AND REMOVAL OF GASTRIC BAND  2011  . TONSILLECTOMY  1994  . WISDOM TOOTH EXTRACTION      Prior to Admission medications   Medication Sig Start Date End Date Taking? Authorizing Provider  acetaminophen (TYLENOL) 500 MG tablet Take 1 tablet (500 mg total) by mouth every 6 (six) hours as needed. 08/06/20   Enid Derry, PA-C  albuterol (PROVENTIL) (5 MG/ML) 0.5% nebulizer solution Take 0.5 mLs (2.5 mg total) by nebulization every 6 (six) hours as needed for up to 5 days for wheezing or shortness of breath.  07/19/20 07/24/20  Shirlee Latch, PA-C  albuterol (VENTOLIN HFA) 108 (90 Base) MCG/ACT inhaler Inhale 2 puffs into the lungs every 6 (six) hours as needed for wheezing or shortness of breath. 07/19/20 08/18/20  Eusebio Friendly B, PA-C  amLODipine (NORVASC) 10 MG tablet Take 1 tablet (10 mg total) by mouth daily. 06/11/20   Flinchum, Eula Fried, FNP  brompheniramine-pseudoephedrine-DM 30-2-10 MG/5ML syrup Take 5 mLs by mouth 4 (four) times daily as needed. 08/06/20   Enid Derry, PA-C  busPIRone (BUSPAR) 5 MG tablet Take 5 mg by mouth 2 (two) times daily.    [provider]  butalbital-acetaminophen-caffeine (FIORICET) 50-325-40 MG tablet Take 1 tablet by mouth every 6 (six) hours as needed for headache. 06/12/20   Anson Fret, MD  ibuprofen (ADVIL) 600 MG tablet Take 1 tablet (600 mg total) by mouth every 6 (six) hours as needed. 08/06/20   Enid Derry, PA-C  levothyroxine (SYNTHROID) 75 MCG tablet Take 1 tablet (75 mcg total) by mouth daily before breakfast. Please go to lab for lab test. 07/18/20   Flinchum, Eula Fried, FNP  lisinopril (ZESTRIL) 40 MG tablet Take 40 mg by mouth daily.    [provider]  meloxicam (MOBIC) 15 MG tablet Take 1 tablet (15 mg total) by mouth daily. Patient not taking: Reported on 07/19/2020 05/22/20   Cuthriell, Delorise Royals, PA-C  metFORMIN (GLUCOPHAGE-XR) 500 MG 24 hr tablet TAKE 4 TABLETS BY MOUTH ONCE DAILY 07/14/20   Flinchum, Eula Fried, FNP  metoprolol succinate (TOPROL XL) 25 MG 24 hr tablet Take 1 tablet (25 mg total) by mouth daily. Check heart rate prior and do not take if heart rate is less than 60 beats per minute. 05/21/20   Flinchum, Eula Fried, FNP  norethindrone (MICRONOR) 0.35 MG tablet Take 1 tablet (0.35 mg total) by mouth daily. 07/27/19   Schuman, Christanna R, MD  ondansetron (ZOFRAN-ODT) 4 MG disintegrating tablet Take 1-2 tablets (4-8 mg total) by mouth every 8 (eight) hours as needed for nausea. 09/15/19   Anson Fret,  MD  predniSONE (DELTASONE) 10 MG tablet Take 6 pills on day 1, 5 pills on day 2 and so on until complete. 08/16/20   Trey Sailors, PA-C  traZODone (DESYREL) 100 MG tablet Take 100 mg by mouth at bedtime as needed for sleep.    [provider]  venlafaxine XR (EFFEXOR-XR) 75 MG 24 hr capsule Take 75 mg by mouth at bedtime.    [provider]    Allergies Toradol [ketorolac tromethamine], Codeine, Other, Pepto-bismol [bismuth], and Tramadol  Family History  Problem Relation Age of Onset  . Cancer Mother        brain tumor  . Hypertension Mother   . Depression Mother   . Diabetes Mother   . Migraines Mother        benign tumor, still gets headaches sometimes now   . Anxiety disorder Father   . Depression Father   . Hypertension Maternal Grandmother   . Thyroid disease Maternal Grandmother   . Diabetes Maternal Grandmother   . Hypertension Maternal Grandfather   . Cancer Maternal Grandfather  myositis  . High blood pressure Maternal Grandfather   . Diabetes Maternal Grandfather   . Stroke Maternal Grandfather     Social History Social History   Tobacco Use  . Smoking status: Never Smoker  . Smokeless tobacco: Never Used  Vaping Use  . Vaping Use: Never used  Substance Use Topics  . Alcohol use: Yes    Comment: rarely, maybe 1 shot every 2-3 months  . Drug use: Never    Review of Systems Constitutional: No fever/chills Eyes: No visual changes. ENT: No sore throat. Cardiovascular: Endorses chest pain. Respiratory: Endorses shortness of breath. Gastrointestinal: No abdominal pain.  No nausea, no vomiting.  No diarrhea. Genitourinary: Negative for dysuria. Musculoskeletal: Negative for acute arthralgias Skin: Negative for rash. Neurological: Negative for headaches, weakness/numbness/paresthesias in any extremity Psychiatric: Negative for suicidal ideation/homicidal ideation   ____________________________________________   PHYSICAL  EXAM:  VITAL SIGNS: ED Triage Vitals  Enc Vitals Group     BP 08/17/20 1204 (!) 146/95     Pulse Rate 08/17/20 1204 89     Resp 08/17/20 1204 (!) 22     Temp 08/17/20 1204 99 F (37.2 C)     Temp Source 08/17/20 1204 Oral     SpO2 08/17/20 1204 97 %     Weight 08/17/20 1205 300 lb (136.1 kg)     Height 08/17/20 1205 5\' 4"  (1.626 m)     Head Circumference --      Peak Flow --      Pain Score 08/17/20 1205 1     Pain Loc --      Pain Edu? --      Excl. in GC? --    Constitutional: Alert and oriented. Well appearing and in no acute distress. Eyes: Conjunctivae are normal. PERRL. Head: Atraumatic. Nose: No congestion/rhinnorhea. Mouth/Throat: Mucous membranes are moist. Neck: No stridor Cardiovascular: Grossly normal heart sounds.  Good peripheral circulation. Respiratory: Slightly increased respiratory effort.  No retractions. Gastrointestinal: Soft and nontender. No distention. Musculoskeletal: No obvious deformities Neurologic:  Normal speech and language. No gross focal neurologic deficits are appreciated. Skin:  Skin is warm and dry. No rash noted. Psychiatric: Mood and affect are normal. Speech and behavior are normal.  ____________________________________________   LABS (all labs ordered are listed, but only abnormal results are displayed)  Labs Reviewed  BASIC METABOLIC PANEL - Abnormal; Notable for the following components:      Result Value   CO2 21 (*)    Glucose, Bld 246 (*)    All other components within normal limits  CBC - Abnormal; Notable for the following components:   RBC 5.66 (*)    Hemoglobin 15.2 (*)    MCV 79.5 (*)    All other components within normal limits  POC URINE PREG, ED  TROPONIN I (HIGH SENSITIVITY)  TROPONIN I (HIGH SENSITIVITY)   ____________________________________________  EKG  ED ECG REPORT I, 08/19/20, the attending physician, personally viewed and interpreted this ECG.  Date: 08/17/2020 EKG Time: 1157 Rate:  82 Rhythm: normal sinus rhythm QRS Axis: normal Intervals: normal ST/T Wave abnormalities: normal Narrative Interpretation: no evidence of acute ischemia  ____________________________________________  RADIOLOGY  ED MD interpretation: Single view portable x-ray of the chest shows no evidence of acute abnormalities including no pneumonia, pneumothorax, or widened mediastinum  Official radiology report(s): DG Chest Port 1 View  Result Date: 08/17/2020 CLINICAL DATA:  Shortness of breath EXAM: PORTABLE CHEST 1 VIEW COMPARISON:  August 16, 2020 FINDINGS: The areas  of subtle airspace opacity in the lung bases seen 1 day prior are no longer evident. Lungs are clear. Heart size and pulmonary vascularity are normal. No adenopathy. No bone lesions. IMPRESSION: Lungs clear.  Cardiac silhouette normal. Electronically Signed   By: Bretta Bang III M.D.   On: 08/17/2020 13:13    ____________________________________________   PROCEDURES  Procedure(s) performed (including Critical Care):  .1-3 Lead EKG Interpretation Performed by: Merwyn Katos, MD Authorized by: Merwyn Katos, MD     Interpretation: normal     ECG rate:  71   ECG rate assessment: normal     Rhythm: sinus rhythm     Ectopy: none     Conduction: normal       ____________________________________________   INITIAL IMPRESSION / ASSESSMENT AND PLAN / ED COURSE  As part of my medical decision making, I reviewed the following data within the electronic MEDICAL RECORD NUMBER Nursing notes reviewed and incorporated, Labs reviewed, EKG interpreted, Old chart reviewed, Radiograph reviewed and Notes from prior ED visits reviewed and incorporated        Presentation most consistent with Viral Syndrome.  Patient has tested positive for COVID-19. Based on vitals and exam they are nontoxic and stable for discharge. Chest x-ray shows no signs of pneumonia Given History and Exam I have a lower suspicion for: Emergent  CardioPulmonary causes [such as Acute Asthma or COPD Exacerbation, acute Heart Failure or exacerbation, PE, PTX, atypical ACS, PNA]. Emergent Otolaryngeal causes [such as PTA, RPA, Ludwigs, Epiglottitis, EBV].  Regarding Emergent Travel or Immunosuppressive related infectious: I have a low suspicion for acute HIV.  Will provide strict return precautions and instructions on self-isolation/quarantine and anticipatory guidance.      ____________________________________________   FINAL CLINICAL IMPRESSION(S) / ED DIAGNOSES  Final diagnoses:  Persistent shortness of breath after COVID-19     ED Discharge Orders    None       Note:  This document was prepared using Dragon voice recognition software and may include unintentional dictation errors.   Merwyn Katos, MD 08/17/20 [phone]

## 2020-08-17 NOTE — Telephone Encounter (Signed)
Patient returned call and was read note by Osvaldo Angst PA-C sent 08/16/20. Patient verbalized understanding of information.  Patient will go to ER for evaluation.  She has her husband to transport.

## 2020-08-21 NOTE — Telephone Encounter (Signed)
LMTCB, PEC Triage Nurse may give patient results  

## 2020-08-22 DIAGNOSIS — Z0271 Encounter for disability determination: Secondary | ICD-10-CM

## 2020-08-22 NOTE — Telephone Encounter (Signed)
Not sure if anyone reached patient, need to triage her and see how she is feeling.

## 2020-08-22 NOTE — Telephone Encounter (Signed)
Pt seen in the ED at [facility] per chart.

## 2020-08-27 NOTE — Telephone Encounter (Signed)
lmtcb . KW 

## 2020-08-28 ENCOUNTER — Other Ambulatory Visit: Payer: Self-pay | Admitting: Adult Health

## 2020-08-28 ENCOUNTER — Telehealth: Payer: Self-pay | Admitting: *Deleted

## 2020-08-28 DIAGNOSIS — E119 Type 2 diabetes mellitus without complications: Secondary | ICD-10-CM

## 2020-08-28 NOTE — Telephone Encounter (Signed)
    Care Management   Unsuccessful Call Note 08/28/2020 Name: Leah Bright MRN: [id number] DOB: 1991/04/15  Leah Bright is a 29 year old female who sees Marvell Fuller, FNP for primary care.    This social worker was unable to reach patient via telephone today for follow up call. I have left HIPAA compliant voicemail asking patient to return my call. (unsuccessful outreach #[phone]).   Plan: This Child psychotherapist will make no additional outreach calls to patient, however will be happy to engage patient upon her return call.    Verna Czech, LCSW Clinical Social Worker  [facility] Care Management [phone]

## 2020-10-17 ENCOUNTER — Ambulatory Visit: Payer: Self-pay | Admitting: Obstetrics and Gynecology

## 2020-10-19 ENCOUNTER — Ambulatory Visit (INDEPENDENT_AMBULATORY_CARE_PROVIDER_SITE_OTHER): Payer: Self-pay | Admitting: Obstetrics and Gynecology

## 2020-10-19 ENCOUNTER — Other Ambulatory Visit: Payer: Self-pay

## 2020-10-19 ENCOUNTER — Ambulatory Visit: Payer: Self-pay | Admitting: *Deleted

## 2020-10-19 ENCOUNTER — Encounter: Payer: Self-pay | Admitting: Obstetrics and Gynecology

## 2020-10-19 VITALS — BP 130/80 | Ht 65.0 in | Wt 296.8 lb

## 2020-10-19 DIAGNOSIS — I152 Hypertension secondary to endocrine disorders: Secondary | ICD-10-CM

## 2020-10-19 DIAGNOSIS — I701 Atherosclerosis of renal artery: Secondary | ICD-10-CM

## 2020-10-19 DIAGNOSIS — N946 Dysmenorrhea, unspecified: Secondary | ICD-10-CM

## 2020-10-19 DIAGNOSIS — I10 Essential (primary) hypertension: Secondary | ICD-10-CM

## 2020-10-19 DIAGNOSIS — N939 Abnormal uterine and vaginal bleeding, unspecified: Secondary | ICD-10-CM

## 2020-10-19 DIAGNOSIS — E1159 Type 2 diabetes mellitus with other circulatory complications: Secondary | ICD-10-CM

## 2020-10-19 DIAGNOSIS — N926 Irregular menstruation, unspecified: Secondary | ICD-10-CM

## 2020-10-19 NOTE — Chronic Care Management (AMB) (Signed)
   10/19/2020  [name] 1990/10/23 [id number]  Message received from patient's OBGYN  visit requesting assistance with medication affordability for patient. CCM referral sent to [facility] for any possible assistance with medication.    Verna Czech, LCSW Clinical Social Worker  [facility] Care Management [phone]

## 2020-10-19 NOTE — Progress Notes (Signed)
Patient ID: Leah Bright, female   DOB: [birth year], 30 y.o.   MRN: [id number]  Reason for Consult: Gynecologic Exam   Referred by Berniece Pap, F*  Subjective:     HPI:  Leah Bright is a 30 y.o. female/she is following up today regarding irregular menstrual cycle.  She reports that she had been taking her progesterone only birth control pill but discontinued several months ago.  Since this time she reports that she has been having regular monthly menstrual cycle.  She reports that she generally has severe pain during her menstrual cycle.  She reports that she will have cramps which can keep her in bed and limit activity.  She reports that her husband has purchased her and Epsom salt bath which she uses and that gives her some relief.  She also applies a heating pad.  She has Midol Advil and Pamprin available at home to take if needed.  She generally only takes these medications if the pain is severe.  She reports that she has had some interested in obtaining pregnancy in the near future.  She is not currently using any methods to prevent pregnancy. She has been trying to improve her health before obtaining pregnancy.   She reports that she stopped taking her progesterone only birth control pill because she ran out of money for the medication.  She says that she has been out of several of her medications.  And included metoprolol as well and these medication she has been out of.  She reported that she had Covid and for about a month had difficulty taking any medication orally because of nausea and vomiting.  Patient has been having issues with worsening of her anxiety again.  She reports that she has pushed herself and twice went into the store 5 below on her own.  She is in there very briefly but is proud of herself for accomplishing that task on her own.  She reports that she is seeing a new therapist next week.  She also reports that she has an appointment to appear before a judge  regarding disability related to her social anxieties.  She reports that her and her husband have also been able to start caring for her third child on the weekends.  She is happy with this as she enjoys being around children.  They are planning a move across the street to a slightly larger home with 3 bedrooms to accommodate for the extra children.g    Past Medical History:  Diagnosis Date  . Agitation   . Allergy   . Anxiety   . Asthma   . Chronic kidney disease   . Depression   . Diabetes mellitus without complication (HCC)   . High cholesterol   . Hypertension   . Hypothyroidism   . Left renal artery stenosis (HCC) 09/07/2019  . Migraine   . OCD (obsessive compulsive disorder)   . PTSD (post-traumatic stress disorder)   . Renal artery stenosis (HCC)   . Right renal atrophy   . Social anxiety disorder   . Social anxiety disorder   . Social phobia   . Thyroid disease    Family History  Problem Relation Age of Onset  . Cancer Mother        brain tumor  . Hypertension Mother   . Depression Mother   . Diabetes Mother   . Migraines Mother        benign tumor, still gets headaches sometimes now   .  Anxiety disorder Father   . Depression Father   . Hypertension Maternal Grandmother   . Thyroid disease Maternal Grandmother   . Diabetes Maternal Grandmother   . Hypertension Maternal Grandfather   . Cancer Maternal Grandfather        myositis  . High blood pressure Maternal Grandfather   . Diabetes Maternal Grandfather   . Stroke Maternal Grandfather    Past Surgical History:  Procedure Laterality Date  . CHOLECYSTECTOMY  2008  . endoscopy/colonoscopy   2011  . LAPAROSCOPIC GASTRIC BANDING  2007  . LAPAROSCOPIC REPAIR AND REMOVAL OF GASTRIC BAND  2011  . TONSILLECTOMY  1994  . WISDOM TOOTH EXTRACTION      Short Social History:  Social History   Tobacco Use  . Smoking status: Never Smoker  . Smokeless tobacco: Never Used  Substance Use Topics  . Alcohol use:  Yes    Comment: rarely, maybe 1 shot every 2-3 months    Allergies  Allergen Reactions  . Toradol [Ketorolac Tromethamine] Hives  . Codeine Rash  . Other Rash    Cilantro and Cedar trees  . Pepto-Bismol [Bismuth] Rash  . Tramadol Rash    Current Outpatient Medications  Medication Sig Dispense Refill  . amLODipine (NORVASC) 10 MG tablet Take 1 tablet (10 mg total) by mouth daily. 90 tablet 0  . busPIRone (BUSPAR) 5 MG tablet Take 5 mg by mouth 2 (two) times daily.    . butalbital-acetaminophen-caffeine (FIORICET) 50-325-40 MG tablet Take 1 tablet by mouth every 6 (six) hours as needed for headache. 10 tablet 1  . levothyroxine (SYNTHROID) 75 MCG tablet Take 1 tablet (75 mcg total) by mouth daily before breakfast. Please go to lab for lab test. 30 tablet 0  . lisinopril (ZESTRIL) 40 MG tablet Take 40 mg by mouth daily.    . metFORMIN (GLUCOPHAGE-XR) 500 MG 24 hr tablet TAKE 4 TABLETS BY MOUTH ONCE DAILY 120 tablet 0  . metoprolol succinate (TOPROL XL) 25 MG 24 hr tablet Take 1 tablet (25 mg total) by mouth daily. Check heart rate prior and do not take if heart rate is less than 60 beats per minute. 90 tablet 0  . norethindrone (MICRONOR) 0.35 MG tablet Take 1 tablet (0.35 mg total) by mouth daily. 1 Package 11  . ondansetron (ZOFRAN-ODT) 4 MG disintegrating tablet Take 1-2 tablets (4-8 mg total) by mouth every 8 (eight) hours as needed for nausea. 30 tablet 11  . traZODone (DESYREL) 100 MG tablet Take 100 mg by mouth at bedtime as needed for sleep.    [name]. venlafaxine XR (EFFEXOR-XR) 75 MG 24 hr capsule Take 150 mg by mouth at bedtime.    [name]. acetaminophen (TYLENOL) 500 MG tablet Take 1 tablet (500 mg total) by mouth every 6 (six) hours as needed. (Patient not taking: Reported on 10/19/2020) 30 tablet 0  . albuterol (PROVENTIL) (5 MG/ML) 0.5% nebulizer solution Take 0.5 mLs (2.5 mg total) by nebulization every 6 (six) hours as needed for up to 5 days for wheezing or shortness of breath. 20 mL 3   . albuterol (VENTOLIN HFA) 108 (90 Base) MCG/ACT inhaler Inhale 2 puffs into the lungs every 6 (six) hours as needed for wheezing or shortness of breath. 1 g 0  . brompheniramine-pseudoephedrine-DM 30-2-10 MG/5ML syrup Take 5 mLs by mouth 4 (four) times daily as needed. (Patient not taking: Reported on 10/19/2020) 120 mL 0  . ibuprofen (ADVIL) 600 MG tablet Take 1 tablet (600 mg total) by mouth every  6 (six) hours as needed. (Patient not taking: Reported on 10/19/2020) 30 tablet 0  . meloxicam (MOBIC) 15 MG tablet Take 1 tablet (15 mg total) by mouth daily. (Patient not taking: No sig reported) 30 tablet 0  . predniSONE (DELTASONE) 10 MG tablet Take 6 pills on day 1, 5 pills on day 2 and so on until complete. (Patient not taking: Reported on 10/19/2020) 21 tablet 0   No current facility-administered medications for this visit.    Review of Systems  Constitutional: Negative for chills, fatigue, fever and unexpected weight change.  HENT: Negative for trouble swallowing.  Eyes: Negative for loss of vision.  Respiratory: Negative for cough, shortness of breath and wheezing.  Cardiovascular: Negative for chest pain, leg swelling, palpitations and syncope.  GI: Negative for abdominal pain, blood in stool, diarrhea, nausea and vomiting.  GU: Negative for difficulty urinating, dysuria, frequency and hematuria.  Musculoskeletal: Negative for back pain, leg pain and joint pain.  Skin: Negative for rash.  Neurological: Negative for dizziness, headaches, light-headedness, numbness and seizures.  Psychiatric: Positive for sleep disturbance. Negative for behavioral problem, confusion and depressed mood.       +Anxiety       Objective:  Objective   Vitals:   10/19/20 1036  BP: 130/80  Weight: 296 lb 12.8 oz (134.6 kg)  Height: 5\' 5"  (1.651 m)   Body mass index is 49.39 kg/m.  Physical Exam Vitals and nursing note reviewed.  Constitutional:      Appearance: She is well-developed and  well-nourished.  HENT:     Head: Normocephalic and atraumatic.  Eyes:     Extraocular Movements: EOM normal.     Pupils: Pupils are equal, round, and reactive to light.  Cardiovascular:     Rate and Rhythm: Normal rate and regular rhythm.     Heart sounds: Gallop:     Pulmonary:     Effort: Pulmonary effort is normal. No respiratory distress.  Skin:    General: Skin is warm and dry.  Neurological:     Mental Status: She is alert and oriented to person, place, and time.  Psychiatric:        Mood and Affect: Mood and affect normal.        Behavior: Behavior normal.        Thought Content: Thought content normal.        Judgment: Judgment normal.     Assessment/Plan:     30 year old With dysmenorrhea and irregular menstrual cycles.  Reports that her menstrual cycle frequency has improved and is more regular.  We discussed taking Advil regularly throughout menstrual cycle to help reduce issues with dysmenorrhea.  Patient could consider investigation for endometriosis in the future all this is not although this is not a priority at this time.  Reach out to chronic care social worker regarding patient's difficulty affording medications and she will place referral to [facility].  Encourage patient if she is considering pregnancy to start a prenatal vitamin.  Discussed timing of intercourse for optimal infertility chances.  Patient encouraged to follow-up if she does not obtain pregnancy.  We will plan to follow-up with the patient in 6 months  More than 20 minutes were spent face to face with the patient in the room, reviewing the medical record, labs and images, and coordinating care for the patient. The plan of management was discussed in detail and counseling was provided.       38 MD [facility], [facility] 10/19/2020 11:20 AM

## 2020-10-22 ENCOUNTER — Telehealth: Payer: Self-pay | Admitting: *Deleted

## 2020-10-22 NOTE — Chronic Care Management (AMB) (Signed)
  Care Management   Outreach Note  10/22/2020 Name: Leah Bright MRN: [id number] DOB: 03/29/1991  Leah Bright is a 30 y.o. year old female who is a primary care patient of Flinchum, Eula Fried, FNP. I reached out to Leah Bright by phone today in response to a referral sent by Ms. Wynona Canes Barasch's PCP, Flinchum, Eula Fried, FNP.  An unsuccessful telephone outreach was attempted today. The patient was referred to the case management team for assistance with care management and care coordination.   Follow Up Plan: A HIPAA compliant phone message was left for the patient providing contact information and requesting a return call.  The care management team will reach out to the patient again over the next 7 days. If patient returns call to provider office, please advise to call Embedded Care Management Care Guide Gwenevere Ghazi at [phone].  Gwenevere Ghazi  Care Guide, Embedded Care Coordination [facility] Management

## 2020-10-29 ENCOUNTER — Ambulatory Visit: Payer: Self-pay | Admitting: *Deleted

## 2020-10-29 NOTE — Telephone Encounter (Signed)
Summary: possible pink eye    Pt wants to know what she should do about her eyes/ she states that when she wakes up they get stuck closed / pt thinks she has pink eye and has eye drops she has been using/ pt has appt tomorrow morning/ please advise      Patient advised UC- no appointment open for today and this has been getting worse since Friday- patient agrees- will call back to cancel appointment if she can be seen today at [facility] Reason for Disposition . MODERATE eye pain (e.g., interferes with normal activities)  Answer Assessment - Initial Assessment Questions 1. EYE DISCHARGE: "Is the discharge in one or both eyes?" "What color is it?" "How much is there?" "When did the discharge start?"      Both, discharge is clear- possible tint of yellow, since Friday 2. REDNESS OF SCLERA: "Is the redness in one or both eyes?" "When did the redness start?"      Both eyes- under eyes- faint redness in the eyse 3. EYELIDS: "Are the eyelids red or swollen?" If Yes, ask: "How much?"      Yes- puffy 4. VISION: "Is there any difficulty seeing clearly?"      Sometimes blurry 5. PAIN: "Is there any pain? If Yes, ask: "How bad is it?" (Scale 1-10; or mild, moderate, severe)    - MILD (1-3): doesn't interfere with normal activities     - MODERATE (4-7): interferes with normal activities or awakens from sleep    - SEVERE (8-10): excruciating pain, unable to do any normal activities       Burning- severe comes and goes 6. CONTACT LENS: "Do you wear contacts?"     Has not been wearing since Thursday night 7. OTHER SYMPTOMS: "Do you have any other symptoms?" (e.g., fever, runny nose, cough)     no 8. PREGNANCY: "Is there any chance you are pregnant?" "When was your last menstrual period?"     No- LMP- just ended  Protocols used: EYE - PUS OR DISCHARGE-A-AH

## 2020-10-29 NOTE — Telephone Encounter (Signed)
Leah Bright- appointment scheduled tomorrow morning. KW

## 2020-10-30 ENCOUNTER — Ambulatory Visit: Payer: Self-pay | Admitting: Adult Health

## 2020-11-02 NOTE — Chronic Care Management (AMB) (Signed)
  Care Management   Note  11/02/2020 Name: KARTHIKA GLASPER MRN: [id number] DOB: [birth year]  [name] is a 30 y.o. year old female who is a primary care patient of Flinchum, Eula Fried, FNP. I reached out to [name] by phone today in response to a referral sent by Ms. Wynona Canes Segal's PCP, Flinchum, Eula Fried, FNP.   Ms. Castrellon was given information about care management services today including:  1. Care management services include personalized support from designated clinical staff supervised by her physician, including individualized plan of care and coordination with other care providers 2. 24/7 contact phone numbers for assistance for urgent and routine care needs. 3. The patient may stop care management services at any time by phone call to the office staff.  Patient agreed to services and verbal consent obtained.   Follow up plan: Telephone appointment with care management team member scheduled for:11/21/2020  [facility] Guide, Embedded Care Coordination [facility] Management

## 2020-11-20 ENCOUNTER — Telehealth: Payer: Self-pay

## 2020-11-20 NOTE — Progress Notes (Signed)
° ° °  Chronic Care Management Pharmacy Assistant   Name: Leah Bright  MRN: [id number] DOB: 1991/04/09  Reason for Encounter: Medication Review/Initial questions for initial visit with clinical pharmacist  on 11/21/2020.  Left Voice message to do initial question prior to patient appointment on 03/23/200 for CCM  with Angelena Sole the Clinical pharmacist.    Please bring medications and supplements to appointment  Medications: Outpatient Encounter Medications as of 11/20/2020  Medication Sig Note   albuterol (PROVENTIL) (5 MG/ML) 0.5% nebulizer solution Take 0.5 mLs (2.5 mg total) by nebulization every 6 (six) hours as needed for up to 5 days for wheezing or shortness of breath.    albuterol (VENTOLIN HFA) 108 (90 Base) MCG/ACT inhaler Inhale 2 puffs into the lungs every 6 (six) hours as needed for wheezing or shortness of breath.    amLODipine (NORVASC) 10 MG tablet Take 1 tablet (10 mg total) by mouth daily.    busPIRone (BUSPAR) 5 MG tablet Take 5 mg by mouth 2 (two) times daily.    butalbital-acetaminophen-caffeine (FIORICET) 50-325-40 MG tablet Take 1 tablet by mouth every 6 (six) hours as needed for headache.    levothyroxine (SYNTHROID) 75 MCG tablet Take 1 tablet (75 mcg total) by mouth daily before breakfast. Please go to lab for lab test.    lisinopril (ZESTRIL) 40 MG tablet Take 40 mg by mouth daily.    meloxicam (MOBIC) 15 MG tablet Take 1 tablet (15 mg total) by mouth daily. (Patient not taking: No sig reported)    metFORMIN (GLUCOPHAGE-XR) 500 MG 24 hr tablet TAKE 4 TABLETS BY MOUTH ONCE DAILY    metoprolol succinate (TOPROL XL) 25 MG 24 hr tablet Take 1 tablet (25 mg total) by mouth daily. Check heart rate prior and do not take if heart rate is less than 60 beats per minute.    norethindrone (MICRONOR) 0.35 MG tablet Take 1 tablet (0.35 mg total) by mouth daily.    ondansetron (ZOFRAN-ODT) 4 MG disintegrating tablet Take 1-2 tablets (4-8 mg total) by mouth every 8  (eight) hours as needed for nausea.    traZODone (DESYREL) 100 MG tablet Take 100 mg by mouth at bedtime as needed for sleep. 10/19/2020: 150 mg   venlafaxine XR (EFFEXOR-XR) 75 MG 24 hr capsule Take 150 mg by mouth at bedtime.    No facility-administered encounter medications on file as of 11/20/2020.      Star Rating Drugs:lisinopril,metformin   Everlean Cherry Clinical Pharmacist Assistant [phone]

## 2020-11-21 ENCOUNTER — Telehealth: Payer: Self-pay

## 2020-12-10 ENCOUNTER — Ambulatory Visit: Payer: Self-pay

## 2020-12-10 NOTE — Telephone Encounter (Signed)
Noted going to urgent care  

## 2020-12-10 NOTE — Telephone Encounter (Signed)
  Pt. Reports she has had nausea and vomiting on and off x 3 weeks. Keeping food and liquids down. Mild diarrhea at times. Tried to perform a home pregnancy test, "but it didn't work." No availability today in the practice. Pt. Will go to UC or try tele health visit through My Chart. Answer Assessment - Initial Assessment Questions 1. VOMITING SEVERITY: "How many times have you vomited in the past 24 hours?"     - MILD:  1 - 2 times/day    - MODERATE: 3 - 5 times/day, decreased oral intake without significant weight loss or symptoms of dehydration    - SEVERE: 6 or more times/day, vomits everything or nearly everything, with significant weight loss, symptoms of dehydration      2-3 2. ONSET: "When did the vomiting begin?"      3 weeks ago 3. FLUIDS: "What fluids or food have you vomited up today?" "Have you been able to keep any fluids down?"     Yes 4. ABDOMINAL PAIN: "Are your having any abdominal pain?" If yes : "How bad is it and what does it feel like?" (e.g., crampy, dull, intermittent, constant)      Dull 5. DIARRHEA: "Is there any diarrhea?" If Yes, ask: "How many times today?"      Mild 6. CONTACTS: "Is there anyone else in the family with the same symptoms?"      No 7. CAUSE: "What do you think is causing your vomiting?"     Unsure 8. HYDRATION STATUS: "Any signs of dehydration?" (e.g., dry mouth [not only dry lips], too weak to stand) "When did you last urinate?"     No 9. OTHER SYMPTOMS: "Do you have any other symptoms?" (e.g., fever, headache, vertigo, vomiting blood or coffee grounds, recent head injury)     Nausea 10. PREGNANCY: "Is there any chance you are pregnant?" "When was your last menstrual period?"       Might be pregnant  Protocols used: [facility]

## 2020-12-26 NOTE — Progress Notes (Signed)
Patient ID: Leah Bright, female   DOB: [birth year], 30 y.o.   MRN: [id number]  Reason for Consult: Vaginal Bleeding   Referred by Berniece Pap, F*  Subjective:     HPI:  Leah Bright is a 30 y.o. female . She is following up for irregular menstrual bleeding   Past Medical History:  Diagnosis Date  . Agitation   . Allergy   . Anxiety   . Asthma   . Chronic kidney disease   . Depression   . Diabetes mellitus without complication (HCC)   . High cholesterol   . Hypertension   . Hypothyroidism   . Left renal artery stenosis (HCC) 09/07/2019  . Migraine   . OCD (obsessive compulsive disorder)   . PTSD (post-traumatic stress disorder)   . Renal artery stenosis (HCC)   . Right renal atrophy   . Social anxiety disorder   . Social anxiety disorder   . Social phobia   . Thyroid disease    Family History  Problem Relation Age of Onset  . Cancer Mother        brain tumor  . Hypertension Mother   . Depression Mother   . Diabetes Mother   . Migraines Mother        benign tumor, still gets headaches sometimes now   . Anxiety disorder Father   . Depression Father   . Hypertension Maternal Grandmother   . Thyroid disease Maternal Grandmother   . Diabetes Maternal Grandmother   . Hypertension Maternal Grandfather   . Cancer Maternal Grandfather        myositis  . High blood pressure Maternal Grandfather   . Diabetes Maternal Grandfather   . Stroke Maternal Grandfather    Past Surgical History:  Procedure Laterality Date  . CHOLECYSTECTOMY  2008  . endoscopy/colonoscopy   2011  . LAPAROSCOPIC GASTRIC BANDING  2007  . LAPAROSCOPIC REPAIR AND REMOVAL OF GASTRIC BAND  2011  . TONSILLECTOMY  1994  . WISDOM TOOTH EXTRACTION      Short Social History:  Social History   Tobacco Use  . Smoking status: Never Smoker  . Smokeless tobacco: Never Used  Substance Use Topics  . Alcohol use: Yes    Comment: rarely, maybe 1 shot every 2-3 months    Allergies   Allergen Reactions  . Toradol [Ketorolac Tromethamine] Hives  . Codeine Rash  . Other Rash    Cilantro and Cedar trees  . Pepto-Bismol [Bismuth] Rash  . Tramadol Rash    Current Outpatient Medications  Medication Sig Dispense Refill  . busPIRone (BUSPAR) 5 MG tablet Take 5 mg by mouth 2 (two) times daily.    . norethindrone (MICRONOR) 0.35 MG tablet Take 1 tablet (0.35 mg total) by mouth daily. 1 Package 11  . ondansetron (ZOFRAN-ODT) 4 MG disintegrating tablet Take 1-2 tablets (4-8 mg total) by mouth every 8 (eight) hours as needed for nausea. 30 tablet 11  . traZODone (DESYREL) 100 MG tablet Take 100 mg by mouth at bedtime as needed for sleep.    [name] venlafaxine XR (EFFEXOR-XR) 75 MG 24 hr capsule Take 150 mg by mouth at bedtime.    [name] albuterol (PROVENTIL) (5 MG/ML) 0.5% nebulizer solution Take 0.5 mLs (2.5 mg total) by nebulization every 6 (six) hours as needed for up to 5 days for wheezing or shortness of breath. 20 mL 3  . albuterol (VENTOLIN HFA) 108 (90 Base) MCG/ACT inhaler Inhale 2 puffs into the lungs every 6 (  six) hours as needed for wheezing or shortness of breath. 1 g 0  . amLODipine (NORVASC) 10 MG tablet Take 1 tablet (10 mg total) by mouth daily. 90 tablet 0  . butalbital-acetaminophen-caffeine (FIORICET) 50-325-40 MG tablet Take 1 tablet by mouth every 6 (six) hours as needed for headache. 10 tablet 1  . levothyroxine (SYNTHROID) 75 MCG tablet Take 1 tablet (75 mcg total) by mouth daily before breakfast. Please go to lab for lab test. 30 tablet 0  . lisinopril (ZESTRIL) 40 MG tablet Take 40 mg by mouth daily.    . meloxicam (MOBIC) 15 MG tablet Take 1 tablet (15 mg total) by mouth daily. (Patient not taking: No sig reported) 30 tablet 0  . metFORMIN (GLUCOPHAGE-XR) 500 MG 24 hr tablet TAKE 4 TABLETS BY MOUTH ONCE DAILY 120 tablet 0  . metoprolol succinate (TOPROL XL) 25 MG 24 hr tablet Take 1 tablet (25 mg total) by mouth daily. Check heart rate prior and do not take if heart  rate is less than 60 beats per minute. 90 tablet 0   No current facility-administered medications for this visit.    REVIEW OF SYSTEMS      Objective:  Objective   Vitals:   12/19/19 1138  BP: 132/82  Weight: (!) 306 lb (138.8 kg)  Height: 5\' 5"  (1.651 m)   Body mass index is 50.92 kg/m.  Physical Exam  Assessment/Plan:     30 yo with irregular menstrual cycles Bleeding pattern has improved with micronor.   26 MD [facility], [facility] 12/26/2020 11:13 AM

## 2021-01-01 ENCOUNTER — Telehealth: Payer: Self-pay | Admitting: Physician Assistant

## 2021-01-01 DIAGNOSIS — J029 Acute pharyngitis, unspecified: Secondary | ICD-10-CM

## 2021-01-01 NOTE — Progress Notes (Signed)
E-Visit for Sore Throat  We are sorry that you are not feeling well.  Here is how we plan to help!  Your symptoms indicate a likely viral infection (Pharyngitis).   Pharyngitis is inflammation in the back of the throat which can cause a sore throat, scratchiness and sometimes difficulty swallowing.   Pharyngitis is typically caused by a respiratory virus and will just run its course.  Please keep in mind that your symptoms could last up to 10 days.  For throat pain, we recommend over the counter oral pain relief medications such as acetaminophen or aspirin, or anti-inflammatory medications such as ibuprofen or naproxen sodium.  Topical treatments such as oral throat lozenges or sprays may be used as needed.  Avoid close contact with loved ones, especially the very young and elderly.  Remember to wash your hands thoroughly throughout the day as this is the number one way to prevent the spread of infection and wipe down door knobs and counters with disinfectant.  After careful review of your answers, I would not recommend and antibiotic for your condition.  Antibiotics should not be used to treat conditions that we suspect are caused by viruses like the virus that causes the common cold or flu. However, some people can have Strep with atypical symptoms. You may need formal testing in clinic or office to confirm if your symptoms continue or worsen.  Providers prescribe antibiotics to treat infections caused by bacteria. Antibiotics are very powerful in treating bacterial infections when they are used properly.  To maintain their effectiveness, they should be used only when necessary.  Overuse of antibiotics has resulted in the development of super bugs that are resistant to treatment!    Home Care:  Only take medications as instructed by your medical team.  Do not drink alcohol while taking these medications.  A steam or ultrasonic humidifier can help congestion.  You can place a towel over your head  and breathe in the steam from hot water coming from a faucet.  Avoid close contacts especially the very young and the elderly.  Cover your mouth when you cough or sneeze.  Always remember to wash your hands.  Get Help Right Away If:  You develop worsening fever or throat pain.  You develop a severe head ache or visual changes.  Your symptoms persist after you have completed your treatment plan.  Make sure you  Understand these instructions.  Will watch your condition.  Will get help right away if you are not doing well or get worse.  Your e-visit answers were reviewed by a board certified advanced clinical practitioner to complete your personal care plan.  Depending on the condition, your plan could have included both over the counter or prescription medications.  If there is a problem please reply  once you have received a response from your provider.  Your safety is important to us.  If you have drug allergies check your prescription carefully.    You can use MyChart to ask questions about todays visit, request a non-urgent call back, or ask for a work or school excuse for 24 hours related to this e-Visit. If it has been greater than 24 hours you will need to follow up with your provider, or enter a new e-Visit to address those concerns.  You will get an e-mail in the next two days asking about your experience.  I hope that your e-visit has been valuable and will speed your recovery. Thank you for using e-visits.    

## 2021-01-01 NOTE — Progress Notes (Signed)
I have spent 5 minutes in review of e-visit questionnaire, review and updating patient chart, medical decision making and response to patient.   Maxie Slovacek Cody Esperanza Madrazo, PA-C    

## 2021-01-17 ENCOUNTER — Other Ambulatory Visit: Payer: Self-pay

## 2021-01-17 DIAGNOSIS — E119 Type 2 diabetes mellitus without complications: Secondary | ICD-10-CM

## 2021-01-17 NOTE — Telephone Encounter (Signed)
Copied from CRM [id number]. Topic: General - Inquiry >> Jan 17, 2021  2:24 PM Daphine Deutscher D wrote: Reason for CRM: pt had Covid back in December and since then she has been having some post covid low grade fevers and problems with her sense on taste and smell.  She wants to know is there anything she can do.  CB [phone]

## 2021-01-23 NOTE — Telephone Encounter (Signed)
More time. Some people have this linger longer than others.

## 2021-01-25 ENCOUNTER — Telehealth: Payer: Self-pay

## 2021-01-25 MED ORDER — METFORMIN HCL ER 500 MG PO TB24: ORAL_TABLET | ORAL | 1 refills | Status: DC

## 2021-01-25 MED ORDER — AMLODIPINE BESYLATE 10 MG PO TABS: 10.0000 mg | ORAL_TABLET | Freq: Every day | ORAL | 0 refills | Status: DC

## 2021-01-25 MED ORDER — LISINOPRIL 40 MG PO TABS: 40.0000 mg | ORAL_TABLET | Freq: Every day | ORAL | 0 refills | Status: DC

## 2021-01-25 NOTE — Addendum Note (Signed)
Addended by: Elby Beck F on: 01/25/2021 02:30 PM   Modules accepted: Orders

## 2021-01-25 NOTE — Addendum Note (Signed)
Addended by: Paschal Dopp on: 01/25/2021 04:38 PM   Modules accepted: Orders

## 2021-01-25 NOTE — Telephone Encounter (Signed)
Had COVID infection in December 2021 and still has not regained her sense of taste and smell completely. Meats and some spices smell bizarre to the point of taking her appetite away. May try sinus irrigation, Flonase at bedtime and some of the Smell Therapy with lemon, cloves, flowers, etc. Recheck prn.

## 2021-01-25 NOTE — Telephone Encounter (Signed)
Medication Refill - Medication: amLODipine (NORVASC) 10 MG tablet  lisinopril (ZESTRIL) 40 MG tablet  metFORMIN (GLUCOPHAGE-XR) 500 MG 24 hr tablet  Pt is completely out she says, she is scheduled for CPE in July (next available)    Has the patient contacted their pharmacy? Yes.   (Agent: If no, request that the patient contact the pharmacy for the refill.) (Agent: If yes, when and what did the pharmacy advise?)  Preferred Pharmacy (with phone number or street name):  [facility] [address] [place] - [address]  [address] [place] [address]  Phone: [phone] Fax: [phone]     Agent: Please be advised that RX refills may take up to 3 business days. We ask that you follow-up with your pharmacy.

## 2021-01-25 NOTE — Telephone Encounter (Signed)
Copied from CRM [id number]. Topic: General - Call Back - No Documentation >> Jan 25, 2021  2:28 PM Randol Kern wrote: Reason for CRM: Pt states that she has lingering symptoms from having Covid back in December. Reports low energy. Wants advice if she should seek a certain shot that she has heard about. Scheduled for CPE (next available in July)

## 2021-01-25 NOTE — Telephone Encounter (Signed)
Requested medication (s) are due for refill today - yes  Requested medication (s) are on the active medication list -yes  Future visit scheduled -yes-03/21/21  Last refill: last ordered- 1 year  Notes to clinic: Patient request RF- historical provider- sent for review of request  Requested Prescriptions  Pending Prescriptions Disp Refills   lisinopril (ZESTRIL) 40 MG tablet      Sig: Take 1 tablet (40 mg total) by mouth daily.      Cardiovascular:  ACE Inhibitors Passed - 01/25/2021  2:30 PM      Passed - Cr in normal range and within 180 days    Creatinine, Ser  Date Value Ref Range Status  08/17/2020 0.62 0.44 - 1.00 mg/dL Final          Passed - K in normal range and within 180 days    Potassium  Date Value Ref Range Status  08/17/2020 4.2 3.5 - 5.1 mmol/L Final          Passed - Patient is not pregnant      Passed - Last BP in normal range    BP Readings from Last 1 Encounters:  10/19/20 130/80          Passed - Valid encounter within last 6 months    Recent Outpatient Visits           5 months ago [place], [place], PA-C   6 months ago Suspected COVID-19 virus infection   [facility], Kelby Aline, FNP   8 months ago Type 2 diabetes mellitus without complication, without long-term current use of insulin ([place])   [facility], Kelby Aline, FNP   11 months ago Generalized abdominal pain   [facility], Vickki Muff, PA-C   1 year ago Type 2 diabetes mellitus without complication, without long-term current use of insulin ([place])   [place], Kelby Aline, FNP       Future Appointments             In 1 month [place], Vickki Muff, PA-C [facility], [facility]              Signed Prescriptions Disp Refills   amLODipine (NORVASC) 10 MG tablet 60 tablet 0    Sig: Take 1 tablet (10 mg total) by mouth daily.       Cardiovascular:  Calcium Channel Blockers Passed - 01/25/2021  2:30 PM      Passed - Last BP in normal range    BP Readings from Last 1 Encounters:  10/19/20 130/80          Passed - Valid encounter within last 6 months    Recent Outpatient Visits           5 months ago [place], [place], PA-C   6 months ago Suspected COVID-19 virus infection   [facility], Kelby Aline, FNP   8 months ago Type 2 diabetes mellitus without complication, without long-term current use of insulin [facility])   [facility], Kelby Aline, FNP   11 months ago Generalized abdominal pain   [place], Vickki Muff, PA-C   1 year ago Type 2 diabetes mellitus without complication, without long-term current use of insulin [facility])   [place], Kelby Aline, FNP       Future Appointments  In 1 month [place]ism[place], Vickki Muff, PA-C [facility], [facility]               metFORMIN (GLUCOPHAGE-XR) 500 MG 24 hr tablet 120 tablet 1    Sig: TAKE 4 TABLETS BY MOUTH ONCE DAILY      Endocrinology:  Diabetes - Biguanides Failed - 01/25/2021  2:30 PM      Failed - HBA1C is between 0 and 7.9 and within 180 days    Hemoglobin A1C  Date Value Ref Range Status  05/21/2020 8.0 (A) 4.0 - 5.6 % Final   Hgb A1c MFr Bld  Date Value Ref Range Status  03/19/2020 8.2 (H) 4.8 - 5.6 % Final    Comment:             Prediabetes: 5.7 - 6.4          Diabetes: >6.4          Glycemic control for adults with diabetes: <7.0           Passed - Cr in normal range and within 360 days    Creatinine, Ser  Date Value Ref Range Status  08/17/2020 0.62 0.44 - 1.00 mg/dL Final          Passed - eGFR in normal range and within 360 days    GFR calc Af Amer  Date Value Ref Range Status  05/22/2020 >60 >60 mL/min Final   GFR, Estimated  Date Value Ref Range Status  08/17/2020 >60 >60 mL/min Final     Comment:    (NOTE) Calculated using the CKD-EPI Creatinine Equation (2021)           Passed - Valid encounter within last 6 months    Recent Outpatient Visits           5 months ago [place], [place], PA-C   6 months ago Suspected COVID-19 virus infection   [facility], Kelby Aline, FNP   8 months ago Type 2 diabetes mellitus without complication, without long-term current use of insulin ([place])   [facility], Kelby Aline, FNP   11 months ago Generalized abdominal pain   [facility], Vickki Muff, PA-C   1 year ago Type 2 diabetes mellitus without complication, without long-term current use of insulin ([place])   [place], Kelby Aline, FNP       Future Appointments             In 1 month [place]ism[place], Vickki Muff, PA-C [facility], [facility]                 Requested Prescriptions  Pending Prescriptions Disp Refills   lisinopril (ZESTRIL) 40 MG tablet      Sig: Take 1 tablet (40 mg total) by mouth daily.      Cardiovascular:  ACE Inhibitors Passed - 01/25/2021  2:30 PM      Passed - Cr in normal range and within 180 days    Creatinine, Ser  Date Value Ref Range Status  08/17/2020 0.62 0.44 - 1.00 mg/dL Final          Passed - K in normal range and within 180 days    Potassium  Date Value Ref Range Status  08/17/2020 4.2 3.5 - 5.1 mmol/L Final          Passed - Patient is not pregnant      Passed - Last BP in normal  range    BP Readings from Last 1 Encounters:  10/19/20 130/80          Passed - Valid encounter within last 6 months    Recent Outpatient Visits           5 months ago [place], [place], [place]   6 months ago Suspected COVID-19 virus infection   [facility], Kelby Aline, FNP   8 months ago Type 2 diabetes mellitus without complication, without  long-term current use of insulin [facility])   [facility], Kelby Aline, FNP   11 months ago Generalized abdominal pain   [place], Vickki Muff, PA-C   1 year ago Type 2 diabetes mellitus without complication, without long-term current use of insulin ([place])   [place], Kelby Aline, FNP       Future Appointments             In 1 month [place]ism[place], Vickki Muff, PA-C [facility], [facility]              Signed Prescriptions Disp Refills   amLODipine (NORVASC) 10 MG tablet 60 tablet 0    Sig: Take 1 tablet (10 mg total) by mouth daily.      Cardiovascular:  Calcium Channel Blockers Passed - 01/25/2021  2:30 PM      Passed - Last BP in normal range    BP Readings from Last 1 Encounters:  10/19/20 130/80          Passed - Valid encounter within last 6 months    Recent Outpatient Visits           5 months ago [place], [place], PA-C   6 months ago Suspected COVID-19 virus infection   [facility], Kelby Aline, FNP   8 months ago Type 2 diabetes mellitus without complication, without long-term current use of insulin ([place])   [facility] [place], Kelby Aline, FNP   11 months ago Generalized abdominal pain   [facility], Vickki Muff, PA-C   1 year ago Type 2 diabetes mellitus without complication, without long-term current use of insulin ([place])   [place], Kelby Aline, FNP       Future Appointments             In 1 month [place], Vickki Muff, PA-C [facility], [facility]               metFORMIN (GLUCOPHAGE-XR) 500 MG 24 hr tablet 120 tablet 1    Sig: TAKE 4 TABLETS BY MOUTH ONCE DAILY      Endocrinology:  Diabetes - Biguanides Failed - 01/25/2021  2:30 PM      Failed - HBA1C is between 0 and 7.9 and within 180 days    Hemoglobin A1C  Date Value Ref Range Status   05/21/2020 8.0 (A) 4.0 - 5.6 % Final   Hgb A1c MFr Bld  Date Value Ref Range Status  03/19/2020 8.2 (H) 4.8 - 5.6 % Final    Comment:             Prediabetes: 5.7 - 6.4          Diabetes: >6.4          Glycemic control for adults with diabetes: <7.0           Passed - Cr in normal range and  within 360 days    Creatinine, Ser  Date Value Ref Range Status  08/17/2020 0.62 0.44 - 1.00 mg/dL Final          Passed - eGFR in normal range and within 360 days    GFR calc Af Amer  Date Value Ref Range Status  05/22/2020 >60 >60 mL/min Final   GFR, Estimated  Date Value Ref Range Status  08/17/2020 >60 >60 mL/min Final    Comment:    (NOTE) Calculated using the CKD-EPI Creatinine Equation (2021)           Passed - Valid encounter within last 6 months    Recent Outpatient Visits           5 months ago [place], [place], PA-C   6 months ago Suspected COVID-19 virus infection   [facility], Kelby Aline, FNP   8 months ago Type 2 diabetes mellitus without complication, without long-term current use of insulin ([place])   [facility], Kelby Aline, FNP   11 months ago Generalized abdominal pain   [facility], Vickki Muff, PA-C   1 year ago Type 2 diabetes mellitus without complication, without long-term current use of insulin ([place])   [place], Kelby Aline, FNP       Future Appointments             In 1 month [place], Vickki Muff, PA-C [facility], [place]

## 2021-01-25 NOTE — Telephone Encounter (Signed)
Patient has appointment scheduled in office- 7/21- courtesy RF 60 day given

## 2021-03-15 ENCOUNTER — Other Ambulatory Visit: Payer: Self-pay

## 2021-03-15 ENCOUNTER — Encounter: Payer: Self-pay | Admitting: Emergency Medicine

## 2021-03-15 ENCOUNTER — Emergency Department
Admission: EM | Admit: 2021-03-15 | Discharge: 2021-03-15 | Disposition: A | Discharge status: Home / Self Care | Payer: Self-pay | Attending: Emergency Medicine | Admitting: Emergency Medicine

## 2021-03-15 DIAGNOSIS — J45909 Unspecified asthma, uncomplicated: Secondary | ICD-10-CM | POA: Insufficient documentation

## 2021-03-15 DIAGNOSIS — G43819 Other migraine, intractable, without status migrainosus: Secondary | ICD-10-CM | POA: Insufficient documentation

## 2021-03-15 DIAGNOSIS — Z7984 Long term (current) use of oral hypoglycemic drugs: Secondary | ICD-10-CM | POA: Insufficient documentation

## 2021-03-15 DIAGNOSIS — E1122 Type 2 diabetes mellitus with diabetic chronic kidney disease: Secondary | ICD-10-CM | POA: Insufficient documentation

## 2021-03-15 DIAGNOSIS — Z79899 Other long term (current) drug therapy: Secondary | ICD-10-CM | POA: Insufficient documentation

## 2021-03-15 DIAGNOSIS — I129 Hypertensive chronic kidney disease with stage 1 through stage 4 chronic kidney disease, or unspecified chronic kidney disease: Secondary | ICD-10-CM | POA: Insufficient documentation

## 2021-03-15 DIAGNOSIS — E039 Hypothyroidism, unspecified: Secondary | ICD-10-CM | POA: Insufficient documentation

## 2021-03-15 DIAGNOSIS — N189 Chronic kidney disease, unspecified: Secondary | ICD-10-CM | POA: Insufficient documentation

## 2021-03-15 LAB — POC URINE PREG, ED: Preg Test, Ur: NEGATIVE

## 2021-03-15 LAB — CBG MONITORING, ED: Glucose-Capillary: 131 mg/dL — ABNORMAL HIGH (ref 70–99)

## 2021-03-15 MED ORDER — METOCLOPRAMIDE HCL 5 MG/ML IJ SOLN
10.0000 mg | Freq: Once | INTRAMUSCULAR | Status: AC
  Administered 2021-03-15: 10 mg via INTRAVENOUS
  Filled 2021-03-15: qty 2

## 2021-03-15 MED ORDER — ACETAMINOPHEN 500 MG PO TABS
1000.0000 mg | ORAL_TABLET | Freq: Once | ORAL | Status: AC
  Administered 2021-03-15: 1000 mg via ORAL
  Filled 2021-03-15: qty 2

## 2021-03-15 MED ORDER — DIPHENHYDRAMINE HCL 50 MG/ML IJ SOLN
12.5000 mg | Freq: Once | INTRAMUSCULAR | Status: AC
  Administered 2021-03-15: 12.5 mg via INTRAVENOUS
  Filled 2021-03-15: qty 1

## 2021-03-15 MED ORDER — ONDANSETRON 4 MG PO TBDP: 4.0000 mg | ORAL_TABLET | Freq: Three times a day (TID) | ORAL | 0 refills | Status: DC | PRN

## 2021-03-15 NOTE — Discharge Instructions (Addendum)
In case you do not have the refills at the pharmacy anymore I did do a new prescription to.  Take this to help with your migraines and return the ER for worsening symptoms or any other concern

## 2021-03-15 NOTE — ED Provider Notes (Signed)
[facility] Emergency Department Provider Note  ____________________________________________   Event Date/Time   First MD Initiated Contact with Patient 03/15/21 1710     (approximate)  I have reviewed the triage vital signs and the nursing notes.   HISTORY  Chief Complaint Headache and Nausea    HPI Leah Bright is a 30 y.o. female with history of diabetes, migraines who comes in with concern for migraine.  Patient reports a history of migraines occur about 2 times a month.  States that she has been having a headache for the last few days that is posterior, associated with some nausea and photophobia.  She reports that she is taking Fioricet without relief.  Denies any blurry vision.   On review of records patient last saw neurology in January 2022.  At that time the neurologist had reviewed the CT imaging from a few years prior that were negative and had also noted that patient previously seen an eye doctor and there was no signs of papilledema per patient report.  Patient was given a new prescription for Fioricet and some Zofran.       Past Medical History:  Diagnosis Date   Agitation    Allergy    Anxiety    Asthma    Chronic kidney disease    Depression    Diabetes mellitus without complication (HCC)    High cholesterol    Hypertension    Hypothyroidism    Left renal artery stenosis (HCC) 09/07/2019   Migraine    OCD (obsessive compulsive disorder)    PTSD (post-traumatic stress disorder)    Renal artery stenosis (HCC)    Right renal atrophy    Social anxiety disorder    Social anxiety disorder    Social phobia    Thyroid disease     Patient Active Problem List   Diagnosis Date Noted   Nausea and vomiting 07/19/2020   Viral upper respiratory tract infection 07/19/2020   Suspected COVID-19 virus infection 07/19/2020   Generalized anxiety disorder 05/21/2020   Mood disorder (HCC) 01/19/2020   Elevated liver enzymes 01/19/2020    Hordeolum externum of left upper eyelid 10/13/2019   Renal atrophy, right 09/20/2019   History of diabetes mellitus, type II 09/20/2019   Asthma, chronic 09/19/2019   Migraine without aura and without status migrainosus, not intractable 09/18/2019   Hypothyroidism 08/02/2018   Essential hypertension 08/02/2018   Morbid obesity (HCC) 08/02/2018   Type 2 diabetes mellitus without complication, without long-term current use of insulin (HCC) 08/02/2018   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/02/2018   Asthma exacerbation 09/28/2015   CAP (community acquired pneumonia) 09/27/2015   Hypokalemia 09/27/2015   Hypomagnesemia 09/27/2015   Hypoxia 09/27/2015   Sepsis due to pneumonia (HCC) 09/27/2015   Hypertension associated with diabetes (HCC) 09/27/2015   Abdominal pain 01/22/2013   Diarrhea 01/22/2013   Rash 01/22/2013    Past Surgical History:  Procedure Laterality Date   CHOLECYSTECTOMY  2008   endoscopy/colonoscopy   2011   LAPAROSCOPIC GASTRIC BANDING  2007   LAPAROSCOPIC REPAIR AND REMOVAL OF GASTRIC BAND  2011   TONSILLECTOMY  1994   WISDOM TOOTH EXTRACTION      Prior to Admission medications   Medication Sig Start Date End Date Taking? Authorizing Provider  albuterol (PROVENTIL) (5 MG/ML) 0.5% nebulizer solution Take 0.5 mLs (2.5 mg total) by nebulization every 6 (six) hours as needed for up to 5  days for wheezing or shortness of breath. 07/19/20 07/24/20  Shirlee Latch, PA-C  albuterol (VENTOLIN HFA) 108 (90 Base) MCG/ACT inhaler Inhale 2 puffs into the lungs every 6 (six) hours as needed for wheezing or shortness of breath. 07/19/20 08/18/20  Eusebio Friendly B, PA-C  amLODipine (NORVASC) 10 MG tablet Take 1 tablet (10 mg total) by mouth daily. 01/25/21   Chrismon, Jodell Cipro, PA-C  busPIRone (BUSPAR) 5 MG tablet Take 5 mg by mouth 2 (two) times daily.    [provider]  butalbital-acetaminophen-caffeine  (FIORICET) 50-325-40 MG tablet Take 1 tablet by mouth every 6 (six) hours as needed for headache. 06/12/20   Anson Fret, MD  levothyroxine (SYNTHROID) 75 MCG tablet Take 1 tablet (75 mcg total) by mouth daily before breakfast. Please go to lab for lab test. 07/18/20   Flinchum, Eula Fried, FNP  lisinopril (ZESTRIL) 40 MG tablet Take 1 tablet (40 mg total) by mouth daily. 01/25/21   Chrismon, Jodell Cipro, PA-C  meloxicam (MOBIC) 15 MG tablet Take 1 tablet (15 mg total) by mouth daily. Patient not taking: No sig reported 05/22/20   Cuthriell, Delorise Royals, PA-C  metFORMIN (GLUCOPHAGE-XR) 500 MG 24 hr tablet TAKE 4 TABLETS BY MOUTH ONCE DAILY 01/25/21   Chrismon, Jodell Cipro, PA-C  metoprolol succinate (TOPROL XL) 25 MG 24 hr tablet Take 1 tablet (25 mg total) by mouth daily. Check heart rate prior and do not take if heart rate is less than 60 beats per minute. 05/21/20   Flinchum, Eula Fried, FNP  norethindrone (MICRONOR) 0.35 MG tablet Take 1 tablet (0.35 mg total) by mouth daily. 07/27/19   Schuman, Christanna R, MD  ondansetron (ZOFRAN-ODT) 4 MG disintegrating tablet Take 1-2 tablets (4-8 mg total) by mouth every 8 (eight) hours as needed for nausea. 09/15/19   Anson Fret, MD  traZODone (DESYREL) 100 MG tablet Take 100 mg by mouth at bedtime as needed for sleep.    [provider]  venlafaxine XR (EFFEXOR-XR) 75 MG 24 hr capsule Take 150 mg by mouth at bedtime.    [provider]    Allergies Toradol [ketorolac tromethamine], Codeine, Other, Pepto-bismol [bismuth], and Tramadol  Family History  Problem Relation Age of Onset   Cancer Mother        brain tumor   Hypertension Mother    Depression Mother    Diabetes Mother    Migraines Mother        benign tumor, still gets headaches sometimes now    Anxiety disorder Father    Depression Father    Hypertension Maternal Grandmother    Thyroid disease Maternal Grandmother    Diabetes Maternal Grandmother    Hypertension  Maternal Grandfather    Cancer Maternal Grandfather        myositis   High blood pressure Maternal Grandfather    Diabetes Maternal Grandfather    Stroke Maternal Grandfather     Social History Social History   Tobacco Use   Smoking status: Never   Smokeless tobacco: Never  Vaping Use   Vaping Use: Never used  Substance Use Topics   Alcohol use: Yes    Comment: rarely, maybe 1 shot every 2-3 months   Drug use: Never      Review of Systems Constitutional: No fever/chills Eyes: No visual changes. ENT: No sore throat. Cardiovascular: Denies chest pain. Respiratory: Denies shortness of breath. Gastrointestinal: No abdominal pain.  No nausea, no vomiting.  No diarrhea.  No constipation. Genitourinary:  Negative for dysuria. Musculoskeletal: Negative for back pain. Skin: Negative for rash. Neurological: Positive headache, no focal weakness or numbness. All other ROS negative ____________________________________________   PHYSICAL EXAM:  VITAL SIGNS: ED Triage Vitals  Enc Vitals Group     BP 03/15/21 1318 (!) 170/108     Pulse Rate 03/15/21 1318 89     Resp 03/15/21 1318 16     Temp 03/15/21 1318 98.9 F (37.2 C)     Temp Source 03/15/21 1318 Oral     SpO2 03/15/21 1318 95 %     Weight 03/15/21 1342 290 lb (131.5 kg)     Height 03/15/21 1342 5\' 5"  (1.651 m)     Head Circumference --      Peak Flow --      Pain Score 03/15/21 1342 7     Pain Loc --      Pain Edu? --      Excl. in GC? --     Constitutional: Alert and oriented. Well appearing and in no acute distress. Eyes: Conjunctivae are normal. EOMI. pupils are reactive bilaterally although she does appear uncomfortable with the light secondary to photophobia Head: Atraumatic. Nose: No congestion/rhinnorhea. Mouth/Throat: Mucous membranes are moist.   Neck: No stridor. Trachea Midline. FROM Cardiovascular: Normal rate, regular rhythm. Grossly normal heart sounds.  Good peripheral circulation. Respiratory:  Normal respiratory effort.  No retractions. Lungs CTAB. Gastrointestinal: Soft and nontender. No distention. No abdominal bruits.  Musculoskeletal: No lower extremity tenderness nor edema.  No joint effusions. Neurologic:  Normal speech and language. No gross focal neurologic deficits are appreciated.  Cranial 2 through 12 are intact.  Equal strength in arms and legs. Skin:  Skin is warm, dry and intact. No rash noted. Psychiatric: Mood and affect are normal. Speech and behavior are normal. GU: Deferred   ____________________________________________   LABS (all labs ordered are listed, but only abnormal results are displayed)  Labs Reviewed  CBG MONITORING, ED - Abnormal; Notable for the following components:      Result Value   Glucose-Capillary 131 (*)    All other components within normal limits  POC URINE PREG, ED   ____________________________________________    INITIAL IMPRESSION / ASSESSMENT AND PLAN / ED COURSE  Leah Bright was evaluated in Emergency Department on 03/15/2021 for the symptoms described in the history of present illness. She was evaluated in the context of the global COVID-19 pandemic, which necessitated consideration that the patient might be at risk for infection with the SARS-CoV-2 virus that causes COVID-19. Institutional protocols and algorithms that pertain to the evaluation of patients at risk for COVID-19 are in a state of rapid change based on information released by regulatory bodies including the CDC and federal and state organizations. These policies and algorithms were followed during the patient's care in the ED.    Patient is a well-appearing 30 year old who comes in with headache and nausea.  Patient states this feels similar to her prior migraines except for not getting better with the Fioricet.  Not sudden or thunderclap in onset to suggest intracranial hemorrhage.  Has had prior CT imaging before that have been reassuring.  No blurry vision  just photophobia so unlikely pseudotumor cerebri.  I did get a pregnancy test to make sure not pregnant.  I got a glucose check to make sure no evidence of DKA  Glucose test and pregnancy test were reassuring.  Patient was given migraine cocktail and had significant relief in symptoms.  Offered her  additional medication but patient fixated that she felt good enough to go home and preferred to go home to sleep it off.  Offered to prescribe her some additional Zofran which she would like to do.  I discussed the provisional nature of ED diagnosis, the treatment so far, the ongoing plan of care, follow up appointments and return precautions with the patient and any family or support people present. They expressed understanding and agreed with the plan, discharged home.    ____________________________________________   FINAL CLINICAL IMPRESSION(S) / ED DIAGNOSES   Final diagnoses:  Other migraine without status migrainosus, intractable      MEDICATIONS GIVEN DURING THIS VISIT:  Medications  metoCLOPramide (REGLAN) injection 10 mg (10 mg Intravenous Given 03/15/21 1757)  diphenhydrAMINE (BENADRYL) injection 12.5 mg (12.5 mg Intravenous Given 03/15/21 1757)  acetaminophen (TYLENOL) tablet 1,000 mg (1,000 mg Oral Given 03/15/21 1757)     ED Discharge Orders          Ordered    ondansetron (ZOFRAN ODT) 4 MG disintegrating tablet  Every 8 hours PRN        03/15/21 1850             Note:  This document was prepared using Dragon voice recognition software and may include unintentional dictation errors.    Concha Se, MD 03/15/21 [phone]

## 2021-03-15 NOTE — ED Notes (Signed)
See triage note regarding CC. Pt endorses headache, dizziness, and nausea for 2 days that has gotten progressively worse.  A&O x 4. VSS

## 2021-03-15 NOTE — ED Triage Notes (Signed)
Pt reports that she has had a migraine for the last 4 days. She has taken her Fioricet but it has not helped. Pt reports that she is nauseated. Light and sound make it worse.

## 2021-03-15 NOTE — ED Notes (Signed)
E-signature pad unavailable for signature. Pt verbalized understanding of D/C instructions. No additional concerns at this time.

## 2021-03-21 ENCOUNTER — Encounter: Payer: Self-pay | Admitting: Family Medicine

## 2021-04-04 ENCOUNTER — Other Ambulatory Visit: Payer: Self-pay | Admitting: Adult Health

## 2021-04-04 DIAGNOSIS — E039 Hypothyroidism, unspecified: Secondary | ICD-10-CM

## 2021-04-07 ENCOUNTER — Other Ambulatory Visit: Payer: Self-pay | Admitting: Adult Health

## 2021-04-07 DIAGNOSIS — E039 Hypothyroidism, unspecified: Secondary | ICD-10-CM

## 2021-04-08 ENCOUNTER — Other Ambulatory Visit: Payer: Self-pay

## 2021-04-08 DIAGNOSIS — E039 Hypothyroidism, unspecified: Secondary | ICD-10-CM

## 2021-04-08 NOTE — Telephone Encounter (Signed)
Medication Refill - Medication:  levothyroxine (SYNTHROID) 75 MCG tablet  Has the patient contacted their pharmacy? Yes.   (Agent: If no, request that the patient contact the pharmacy for the refill.) (Agent: If yes, when and what did the pharmacy advise?)  Preferred Pharmacy (with phone number or street name):  [facility] [address] [place], [place] - [address] Phone:  [phone]  Fax:  [phone]      Agent: Please be advised that RX refills may take up to 3 business days. We ask that you follow-up with your pharmacy.    PATIENT GOT 2 PILLS FROM PHARMACY BUT NEEDS REFILL ASAP. HAS APPOINTMENT ON 04/29/21

## 2021-04-08 NOTE — Telephone Encounter (Signed)
   Notes to clinic:  Patient has appt scheduled for 04/29/2021 Review for supply until that time    Requested Prescriptions  Pending Prescriptions Disp Refills   levothyroxine (SYNTHROID) 75 MCG tablet 30 tablet 0    Sig: Take 1 tablet (75 mcg total) by mouth daily before breakfast. Please go to lab for lab test.      Endocrinology:  Hypothyroid Agents Failed - 04/08/2021  9:55 AM      Failed - TSH needs to be rechecked within 3 months after an abnormal result. Refill until TSH is due.      Failed - TSH in normal range and within 360 days    TSH  Date Value Ref Range Status  03/19/2020 6.330 (H) 0.450 - 4.500 uIU/mL Final          Passed - Valid encounter within last 12 months    Recent Outpatient Visits           7 months ago COVID-19   [facility] [place], [place], PA-C   8 months ago Suspected COVID-19 virus infection   [facility] Fl[place], Eula Fried, FNP   10 months ago Type 2 diabetes mellitus without complication, without long-term current use of insulin (HCC)   [facility] Flinchum, Eula Fried, FNP   1 year ago Generalized abdominal pain   [facility] Chrismon, Jodell Cipro, PA-C   1 year ago Type 2 diabetes mellitus without complication, without long-term current use of insulin (HCC)   [facility] Flinchum, Eula Fried, FNP       Future Appointments             In 3 weeks Chrismon, Jodell Cipro, PA-C [facility]

## 2021-04-22 ENCOUNTER — Ambulatory Visit (INDEPENDENT_AMBULATORY_CARE_PROVIDER_SITE_OTHER): Payer: Self-pay | Admitting: Obstetrics and Gynecology

## 2021-04-22 ENCOUNTER — Other Ambulatory Visit: Payer: Self-pay

## 2021-04-22 ENCOUNTER — Encounter: Payer: Self-pay | Admitting: Obstetrics and Gynecology

## 2021-04-22 VITALS — BP 140/95 | Ht 65.0 in | Wt 296.4 lb

## 2021-04-22 DIAGNOSIS — N926 Irregular menstruation, unspecified: Secondary | ICD-10-CM

## 2021-04-22 NOTE — Progress Notes (Signed)
Patient ID: Leah Bright, female   DOB: 1990/11/05, 30 y.o.   MRN: [id number]  Reason for Consult: Gynecologic Exam   Referred by Berniece Pap, F*  Subjective:     HPI:  Leah Bright is a 30 y.o. female. She is following up today for a GYN visit. She reports that she has been having a regular monthly period. She denies issues with pain or heavy bleeding. She is working with her therapist regarding her social anxiety. She reports she has a court hearing regarding qualifying for disability in October. 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 She has been taking a prenatal vitamin. She would like to obtain pregnancy and find out if she can get pregnant. She reports she generally is sexually active 4 times a month. She has not been timing intercourse based on her period.  Gynecological History  Patient's last menstrual period was 03/27/2021.   Past Medical History:  Diagnosis Date   Agitation    Allergy    Anxiety    Asthma    Chronic kidney disease    Depression    Diabetes mellitus without complication (HCC)    High cholesterol    Hypertension    Hypothyroidism    Left renal artery stenosis (HCC) 09/07/2019   Migraine    OCD (obsessive compulsive disorder)    PTSD (post-traumatic stress disorder)    Renal artery stenosis (HCC)    Right renal atrophy    Social anxiety disorder    Social anxiety disorder    Social phobia    Thyroid disease    Family History  Problem Relation Age of Onset   Cancer Mother        brain tumor   Hypertension Mother    Depression Mother    Diabetes Mother    Migraines Mother        benign tumor, still gets headaches sometimes now    Anxiety disorder Father    Depression Father    Hypertension Maternal Grandmother    Thyroid disease Maternal Grandmother    Diabetes Maternal Grandmother    Hypertension Maternal  Grandfather    Cancer Maternal Grandfather        myositis   High blood pressure Maternal Grandfather    Diabetes Maternal Grandfather    Stroke Maternal Grandfather    Past Surgical History:  Procedure Laterality Date   CHOLECYSTECTOMY  2008   endoscopy/colonoscopy   2011   LAPAROSCOPIC GASTRIC BANDING  2007   LAPAROSCOPIC REPAIR AND REMOVAL OF GASTRIC BAND  2011   TONSILLECTOMY  1994   WISDOM TOOTH EXTRACTION      Short Social History:  Social History   Tobacco Use   Smoking status: Never   Smokeless tobacco: Never  Substance Use Topics   Alcohol use: Yes    Comment: rarely, maybe 1 shot every 2-3 months    Allergies  Allergen Reactions   Toradol [Ketorolac Tromethamine] Hives   Codeine Rash   Other Rash    Cilantro and Cedar trees   Pepto-Bismol [Bismuth] Rash   Tramadol Rash    Current Outpatient Medications  Medication Sig Dispense Refill   amLODipine (NORVASC) 10 MG tablet Take 1 tablet (10 mg total) by mouth daily. 60 tablet 0   busPIRone (BUSPAR) 5 MG tablet Take 5 mg by mouth 2 (two) times daily.  butalbital-acetaminophen-caffeine (FIORICET) 50-325-40 MG tablet Take 1 tablet by mouth every 6 (six) hours as needed for headache. 10 tablet 1   levothyroxine (SYNTHROID) 75 MCG tablet Take 1 tablet (75 mcg total) by mouth daily before breakfast. Please go to lab for lab test. 30 tablet 0   metFORMIN (GLUCOPHAGE-XR) 500 MG 24 hr tablet TAKE 4 TABLETS BY MOUTH ONCE DAILY 120 tablet 1   metoprolol succinate (TOPROL XL) 25 MG 24 hr tablet Take 1 tablet (25 mg total) by mouth daily. Check heart rate prior and do not take if heart rate is less than 60 beats per minute. 90 tablet 0   ondansetron (ZOFRAN ODT) 4 MG disintegrating tablet Take 1 tablet (4 mg total) by mouth every 8 (eight) hours as needed for nausea or vomiting. 20 tablet 0   traZODone (DESYREL) 100 MG tablet Take 100 mg by mouth at bedtime as needed for sleep.     venlafaxine XR (EFFEXOR-XR) 75 MG 24 hr  capsule Take 150 mg by mouth at bedtime.     albuterol (PROVENTIL) (5 MG/ML) 0.5% nebulizer solution Take 0.5 mLs (2.5 mg total) by nebulization every 6 (six) hours as needed for up to 5 days for wheezing or shortness of breath. 20 mL 3   albuterol (VENTOLIN HFA) 108 (90 Base) MCG/ACT inhaler Inhale 2 puffs into the lungs every 6 (six) hours as needed for wheezing or shortness of breath. 1 g 0   lisinopril (ZESTRIL) 40 MG tablet Take 1 tablet (40 mg total) by mouth daily. (Patient not taking: Reported on 04/22/2021) 30 tablet 0   meloxicam (MOBIC) 15 MG tablet Take 1 tablet (15 mg total) by mouth daily. (Patient not taking: No sig reported) 30 tablet 0   norethindrone (MICRONOR) 0.35 MG tablet Take 1 tablet (0.35 mg total) by mouth daily. (Patient not taking: Reported on 04/22/2021) 1 Package 11   ondansetron (ZOFRAN-ODT) 4 MG disintegrating tablet Take 1-2 tablets (4-8 mg total) by mouth every 8 (eight) hours as needed for nausea. 30 tablet 11   No current facility-administered medications for this visit.    Review of Systems  Constitutional: Negative for chills, fatigue, fever and unexpected weight change.  HENT: Negative for trouble swallowing.  Eyes: Negative for loss of vision.  Respiratory: Negative for cough, shortness of breath and wheezing.  Cardiovascular: Negative for chest pain, leg swelling, palpitations and syncope.  GI: Negative for abdominal pain, blood in stool, diarrhea, nausea and vomiting.  GU: Negative for difficulty urinating, dysuria, frequency and hematuria.  Musculoskeletal: Negative for back pain, leg pain and joint pain.  Skin: Negative for rash.  Neurological: Negative for dizziness, headaches, light-headedness, numbness and seizures.  Psychiatric: Negative for behavioral problem, confusion, depressed mood and sleep disturbance.       Objective:  Objective   Vitals:   04/22/21 1106  BP: (!) 140/95  Weight: 296 lb 6.4 oz (134.4 kg)  Height: 5\' 5"  (1.651 m)    Body mass index is 49.32 kg/m.  Physical Exam Vitals and nursing note reviewed. Exam conducted with a chaperone present.  Constitutional:      Appearance: Normal appearance.  HENT:     Head: Normocephalic and atraumatic.  Eyes:     Extraocular Movements: Extraocular movements intact.     Pupils: Pupils are equal, round, and reactive to light.  Cardiovascular:     Rate and Rhythm: Normal rate and regular rhythm.  Pulmonary:     Effort: Pulmonary effort is normal.  Breath sounds: Normal breath sounds.  Abdominal:     General: Abdomen is flat.     Palpations: Abdomen is soft.  Musculoskeletal:     Cervical back: Normal range of motion.  Skin:    General: Skin is warm and dry.  Neurological:     General: No focal deficit present.     Mental Status: She is alert and oriented to person, place, and time.  Psychiatric:        Behavior: Behavior normal.        Thought Content: Thought content normal.        Judgment: Judgment normal.    Assessment/Plan:     30 yo who desires pregnancy We discussed timing of intercourse for conception. Encouraged every other day intercourse during cycle days 9-20. Provided with fertility awareness information. Continue PNV. Encouraged healthy lifestyle choices. Discussed that fertility evaluation involve labs to help determine ovulation, pelvic x-ray or HSG for tubal patency and semen analysis. She will consider if she wants to have this testing.   More than 15 minutes were spent face to face with the patient in the room, reviewing the medical record, labs and images, and coordinating care for the patient. The plan of management was discussed in detail and counseling was provided.    Adelene Idler MD [facility], [facility] 04/22/2021 11:20 AM

## 2021-04-29 ENCOUNTER — Ambulatory Visit (INDEPENDENT_AMBULATORY_CARE_PROVIDER_SITE_OTHER): Payer: Self-pay | Admitting: Family Medicine

## 2021-04-29 ENCOUNTER — Other Ambulatory Visit: Payer: Self-pay

## 2021-04-29 ENCOUNTER — Encounter: Payer: Self-pay | Admitting: Family Medicine

## 2021-04-29 VITALS — BP 140/100 | HR 80 | Temp 99.1°F | Wt 289.0 lb

## 2021-04-29 DIAGNOSIS — E119 Type 2 diabetes mellitus without complications: Secondary | ICD-10-CM

## 2021-04-29 DIAGNOSIS — E039 Hypothyroidism, unspecified: Secondary | ICD-10-CM

## 2021-04-29 DIAGNOSIS — Z6841 Body Mass Index (BMI) 40.0 and over, adult: Secondary | ICD-10-CM

## 2021-04-29 DIAGNOSIS — J40 Bronchitis, not specified as acute or chronic: Secondary | ICD-10-CM

## 2021-04-29 DIAGNOSIS — Z8679 Personal history of other diseases of the circulatory system: Secondary | ICD-10-CM

## 2021-04-29 DIAGNOSIS — J0140 Acute pansinusitis, unspecified: Secondary | ICD-10-CM

## 2021-04-29 DIAGNOSIS — I10 Essential (primary) hypertension: Secondary | ICD-10-CM

## 2021-04-29 MED ORDER — AMOXICILLIN 875 MG PO TABS: 875.0000 mg | ORAL_TABLET | Freq: Two times a day (BID) | ORAL | 0 refills | Status: AC

## 2021-04-29 MED ORDER — LEVOTHYROXINE SODIUM 75 MCG PO TABS: 75.0000 ug | ORAL_TABLET | Freq: Every day | ORAL | 0 refills | Status: DC

## 2021-04-29 MED ORDER — FLUTICASONE-SALMETEROL 250-50 MCG/ACT IN AEPB: 1.0000 | INHALATION_SPRAY | Freq: Two times a day (BID) | RESPIRATORY_TRACT | 0 refills | Status: DC

## 2021-04-29 NOTE — Progress Notes (Signed)
Complete physical exam   Patient: Leah Bright   DOB: [birth year]   30 y.o. Female  MRN: [id number] Visit Date: 04/29/2021  Today's healthcare provider: Dortha Kern, PA-C   No chief complaint on file.  Subjective    Leah Bright is a 30 y.o. female who presents today for a complete physical exam.  She reports consuming a general diet. The patient does not participate in regular exercise at present. She generally feels well. She reports sleeping fairly well. She does have additional problems to discuss today.    Patient also complains of cold like symptoms.  She has not tested for Covid-19.  Her symptoms have been present for 4 days.  Past Medical History:  Diagnosis Date   Agitation    Allergy    Anxiety    Asthma    Chronic kidney disease    Depression    Diabetes mellitus without complication (HCC)    High cholesterol    Hypertension    Hypothyroidism    Left renal artery stenosis (HCC) 09/07/2019   Migraine    OCD (obsessive compulsive disorder)    PTSD (post-traumatic stress disorder)    Renal artery stenosis (HCC)    Right renal atrophy    Social anxiety disorder    Social anxiety disorder    Social phobia    Thyroid disease    Past Surgical History:  Procedure Laterality Date   CHOLECYSTECTOMY  2008   endoscopy/colonoscopy   2011   LAPAROSCOPIC GASTRIC BANDING  2007   LAPAROSCOPIC REPAIR AND REMOVAL OF GASTRIC BAND  2011   TONSILLECTOMY  1994   WISDOM TOOTH EXTRACTION     Social History   Socioeconomic History   Marital status: Married    Spouse name: Not on file   Number of children: 2   Years of education: Not on file   Highest education level: Some college, no degree  Occupational History   Not on file  Tobacco Use   Smoking status: Never   Smokeless tobacco: Never  Vaping Use   Vaping Use: Never used  Substance and Sexual Activity   Alcohol use: Yes    Comment: rarely, maybe 1 shot every 2-3 months   Drug use: Never   Sexual  activity: Yes    Birth control/protection: Pill  Other Topics Concern   Not on file  Social History Narrative   Lives at home with husband & kids   Right handed   Caffeine: 2x weekly   Social Determinants of Health   Financial Resource Strain: Not on file  Food Insecurity: Not on file  Transportation Needs: Not on file  Physical Activity: Not on file  Stress: Not on file  Social Connections: Not on file  Intimate Partner Violence: Not on file   Family Status  Relation Name Status   Mother  Alive   Father  Alive   MGM  (Not Specified)   MGF  (Not Specified)   Family History  Problem Relation Age of Onset   Cancer Mother        brain tumor   Hypertension Mother    Depression Mother    Diabetes Mother    Migraines Mother        benign tumor, still gets headaches sometimes now    Anxiety disorder Father    Depression Father    Hypertension Maternal Grandmother    Thyroid disease Maternal Grandmother    Diabetes Maternal Grandmother    Hypertension  Maternal Grandfather    Cancer Maternal Grandfather        myositis   High blood pressure Maternal Grandfather    Diabetes Maternal Grandfather    Stroke Maternal Grandfather    Allergies  Allergen Reactions   Toradol [Ketorolac Tromethamine] Hives   Codeine Rash   Other Rash    Cilantro and Cedar trees   Pepto-Bismol [Bismuth] Rash   Tramadol Rash    Patient Care Team: Jacky KindlePayne, Elise T, FNP as PCP - General (Family Medicine) [place], [place] M, LCSW as [facility] Care Management Gaspar ColaFleury, Alexandre A, [facility]PH (Pharmacist)   Medications: Outpatient Medications Prior to Visit  Medication Sig   amLODipine (NORVASC) 10 MG tablet Take 1 tablet (10 mg total) by mouth daily.   busPIRone (BUSPAR) 5 MG tablet Take 5 mg by mouth 2 (two) times daily.   butalbital-acetaminophen-caffeine (FIORICET) 50-325-40 MG tablet Take 1 tablet by mouth every 6 (six) hours as needed for headache.   levothyroxine (SYNTHROID) 75 MCG  tablet Take 1 tablet (75 mcg total) by mouth daily before breakfast. Please go to lab for lab test.   metFORMIN (GLUCOPHAGE-XR) 500 MG 24 hr tablet TAKE 4 TABLETS BY MOUTH ONCE DAILY   metoprolol succinate (TOPROL XL) 25 MG 24 hr tablet Take 1 tablet (25 mg total) by mouth daily. Check heart rate prior and do not take if heart rate is less than 60 beats per minute.   ondansetron (ZOFRAN ODT) 4 MG disintegrating tablet Take 1 tablet (4 mg total) by mouth every 8 (eight) hours as needed for nausea or vomiting.   traZODone (DESYREL) 100 MG tablet Take 150 mg by mouth at bedtime as needed for sleep.   venlafaxine XR (EFFEXOR-XR) 75 MG 24 hr capsule Take 150 mg by mouth at bedtime.   albuterol (PROVENTIL) (5 MG/ML) 0.5% nebulizer solution Take 0.5 mLs (2.5 mg total) by nebulization every 6 (six) hours as needed for up to 5 days for wheezing or shortness of breath.   albuterol (VENTOLIN HFA) 108 (90 Base) MCG/ACT inhaler Inhale 2 puffs into the lungs every 6 (six) hours as needed for wheezing or shortness of breath.   lisinopril (ZESTRIL) 40 MG tablet Take 1 tablet (40 mg total) by mouth daily.   meloxicam (MOBIC) 15 MG tablet Take 1 tablet (15 mg total) by mouth daily.   norethindrone (MICRONOR) 0.35 MG tablet Take 1 tablet (0.35 mg total) by mouth daily.   [DISCONTINUED] ondansetron (ZOFRAN-ODT) 4 MG disintegrating tablet Take 1-2 tablets (4-8 mg total) by mouth every 8 (eight) hours as needed for nausea.   No facility-administered medications prior to visit.    Review of Systems  Constitutional: Negative.   HENT:  Positive for congestion, rhinorrhea, sneezing, sore throat and voice change.   Eyes: Negative.   Respiratory: Negative.    Cardiovascular: Negative.   Gastrointestinal: Negative.   Endocrine: Negative.   Genitourinary: Negative.   Musculoskeletal: Negative.   Skin: Negative.   Allergic/Immunologic: Negative.   Neurological: Negative.   Hematological: Negative.    Psychiatric/Behavioral: Negative.       Objective    BP (!) 140/100 (BP Location: Right Arm, Patient Position: Sitting, Cuff Size: Large)   Pulse 80   Temp 99.1 F (37.3 C) (Oral)   Wt 289 lb (131.1 kg)   SpO2 100%   BMI 48.09 kg/m  BP Readings from Last 3 Encounters:  04/29/21 (!) 140/100  04/22/21 (!) 140/95  03/15/21 (!) 158/88   Wt Readings from Last  3 Encounters: 04/29/21 289 lb (131.1 kg) 04/22/21 296 lb 6.4 oz (134.4 kg) 03/15/21 290 lb (131.5 kg)   Physical Exam Constitutional:      Appearance: Normal appearance. She is normal weight.  HENT:     Head: Normocephalic and atraumatic.     Right Ear: Tympanic membrane, ear canal and external ear normal.     Left Ear: Tympanic membrane, ear canal and external ear normal.     Nose: Nose normal.     Comments: Tender ethmoid, frontal and maxillary sinuses to palpation.    Mouth/Throat:     Mouth: Mucous membranes are moist.     Pharynx: Oropharynx is clear.  Eyes:     Extraocular Movements: Extraocular movements intact.     Conjunctiva/sclera: Conjunctivae normal.     Pupils: Pupils are equal, round, and reactive to light.  Cardiovascular:     Rate and Rhythm: Normal rate and regular rhythm.     Pulses: Normal pulses.     Heart sounds: Normal heart sounds.  Pulmonary:     Effort: Pulmonary effort is normal.     Breath sounds: Wheezing present.  Abdominal:     General: Abdomen is flat. Bowel sounds are normal.     Palpations: Abdomen is soft.  Musculoskeletal:        General: Normal range of motion.     Cervical back: Normal range of motion and neck supple.  Skin:    General: Skin is warm and dry.  Neurological:     General: No focal deficit present.     Mental Status: She is alert and oriented to person, place, and time. Mental status is at baseline.     Motor: Weakness: .prov.  Psychiatric:        Mood and Affect: Mood normal.        Behavior: Behavior normal.        Thought Content: Thought content  normal.        Judgment: Judgment normal.    Last depression screening scores PHQ 2/9 Scores 04/29/2021 09/26/2019 07/19/2019  PHQ - 2 Score 3 2 2   PHQ- 9 Score 10 7 7    Last fall risk screening Fall Risk  04/29/2021  Falls in the past year? 0  Number falls in past yr: 0  Injury with Fall? 0   Last Audit-C alcohol use screening Alcohol Use Disorder Test (AUDIT) 04/29/2021  1. How often do you have a drink containing alcohol? 1  2. How many drinks containing alcohol do you have on a typical day when you are drinking? 0  3. How often do you have six or more drinks on one occasion? 0  AUDIT-C Score 1   A score of 3 or more in women, and 4 or more in men indicates increased risk for alcohol abuse, EXCEPT if all of the points are from question 1   No results found for any visits on 04/29/21.  Assessment & Plan    Routine Health Maintenance and Physical Exam  Exercise Activities and Dietary recommendations  Goals       "I need a CT scan of my kidneys" (pt-stated)      Current Barriers:  Financial constraints related to lack of insurance  to cover needed medical follow up  Clinical Social Work Clinical Goal(s):  Over the next 90 days, patient will work on completing a 05/01/2021 to cover needed medical follow up  Interventions: Followed up with patient regarding medicaid application completion and submission. Patient  confirmed that she has everything submitted accept her spouse's banks statements, she is waiting on him to provide this  Patient continues to  follow up  with her therapist (twice per week) and her psychiatrist and feels that her treatment is going well This social worker continued to encouraged contacting the Medicaid Worker to discuss required documents and need for more time to avoid starting process over. Positive reinforcement provided for her continued participation in mental health treatment and follow through with the medicaid application  process Discussed plans with patient for ongoing care management follow up and provided patient with direct contact information for care management team   Patient Self Care Activities:  Patient verbalizes understanding of plan to completed Medicaid application once received Unable to perform IADLs independently Knowledge deficit related to community resources to meet her medical needs  Please see past updates related to this goal by clicking on the "Past Updates" button in the selected goal           There is no immunization history on file for this patient.  Health Maintenance  Topic Date Due   PNEUMOCOCCAL POLYSACCHARIDE VACCINE AGE 63-64 HIGH RISK  Never done   COVID-19 Vaccine (1) Never done   FOOT EXAM  Never done   HIV Screening  Never done   TETANUS/TDAP  Never done   OPHTHALMOLOGY EXAM  07/11/2020   HEMOGLOBIN A1C  11/18/2020   INFLUENZA VACCINE  04/01/2021   PAP SMEAR-Modifier  08/03/2022   Hepatitis C Screening  Completed   Pneumococcal Vaccine 41-60 Years old  Aged Out   HPV VACCINES  Aged Out    Discussed health benefits of physical activity, and encouraged her to engage in regular exercise appropriate for her age and condition.  1. Subacute pansinusitis Developed pressure around eyes over the past 4 days. No fever, loss of taste or sore throats. Tenderness to palpate maxillary, ethmoid and frontal sinuses. Will check labs for signs of infection. Advised to get COVID test if fever develops or persistent symptoms in 3 days. May use Flonase and Mucinex for congestion. Add antibiotic and recheck prn. - CBC with Differential/Platelet - amoxicillin (AMOXIL) 875 MG tablet; Take 1 tablet (875 mg total) by mouth 2 (two) times daily for 10 days.  Dispense: 20 tablet; Refill: 0  2. Bronchitis Some wheezing but no rhonchi. Will add Advair to her Albuterol inhaler and antibiotic for pansinusitis.  - CBC with Differential/Platelet - fluticasone-salmeterol (ADVAIR) 250-50  MCG/ACT AEPB; Inhale 1 puff into the lungs in the morning and at bedtime.  Dispense: 60 each; Refill: 0  3. Hypothyroidism, unspecified type Tolerating Levothyroxine 75 mcg qd without tremor, hair loss or exophthalmos. Need to recheck labs with difficulty losing weight. Refill Levothyroxine. - CBC with Differential/Platelet - Comprehensive metabolic panel - TSH - levothyroxine (SYNTHROID) 75 MCG tablet; Take 1 tablet (75 mcg total) by mouth daily before breakfast. Please go to lab for lab test.  Dispense: 30 tablet; Refill: 0  4. Type 2 diabetes mellitus without complication, without long-term current use of insulin (HCC) Denies hypoglycemic episodes. No significant polyuria but Class 3 Obesity.presently on Metformin-XR 2000 mg qd. Due for follow up labs - CBC with Differential/Platelet - Hemoglobin A1c - Lipid Panel With LDL/HDL Ratio - Comprehensive metabolic panel - Microalbumin / creatinine urine ratio  5. Essential hypertension History of right renal atrophy without demonstrable artery stenosis on CTA. BP still difficult to control with Lisinopril 40 mg qd, Metoprolol Succinate 25 mg qd (not to take if  HR <60) and Amlodipine 10 mg qd. Recheck labs. May need evaluation by nephrologist. - CBC with Differential/Platelet - Lipid Panel With LDL/HDL Ratio - Comprehensive metabolic panel - TSH  6. Class 3 severe obesity due to excess calories with serious comorbidity and body mass index (BMI) of 45.0 to 49.9 in adult [facility]) Counseled regarding weight management with hypertension and diabetes. Need to walk for exercise 30 minutes 3-4 days a week and reduce caloric intake. Recheck labs. - CBC with Differential/Platelet - Hemoglobin A1c - Lipid Panel With LDL/HDL Ratio - Comprehensive metabolic panel - TSH  7. History of stenosis of renal artery History of chronic right renal atrophy without renal artery stenosis per CT angiogram (Dr. Gilda Crease - vascular specialist). Recheck labs. - CBC  with Differential/Platelet - Comprehensive metabolic panel   No follow-ups on file.     I, Carmel Garfield, PA-C, have reviewed all documentation for this visit. The documentation on 04/29/21 for the exam, diagnosis, procedures, and orders are all accurate and complete.    Dortha Kern, PA-C  [employer] [phone] (phone) [phone] (fax)  [facility]

## 2021-05-10 LAB — CBC WITH DIFFERENTIAL/PLATELET
Basophils Absolute: 0 10*3/uL (ref 0.0–0.2)
Basos: 1 %
EOS (ABSOLUTE): 0.1 10*3/uL (ref 0.0–0.4)
Eos: 1 %
Hematocrit: 40.6 % (ref 34.0–46.6)
Hemoglobin: 13.5 g/dL (ref 11.1–15.9)
Immature Grans (Abs): 0 10*3/uL (ref 0.0–0.1)
Immature Granulocytes: 0 %
Lymphocytes Absolute: 2.1 10*3/uL (ref 0.7–3.1)
Lymphs: 34 %
MCH: 27.4 pg (ref 26.6–33.0)
MCHC: 33.3 g/dL (ref 31.5–35.7)
MCV: 82 fL (ref 79–97)
Monocytes Absolute: 0.4 10*3/uL (ref 0.1–0.9)
Monocytes: 6 %
Neutrophils Absolute: 3.5 10*3/uL (ref 1.4–7.0)
Neutrophils: 58 %
Platelets: 297 10*3/uL (ref 150–450)
RBC: 4.93 x10E6/uL (ref 3.77–5.28)
RDW: 13 % (ref 11.7–15.4)
WBC: 6.1 10*3/uL (ref 3.4–10.8)

## 2021-05-10 LAB — COMPREHENSIVE METABOLIC PANEL
ALT: 58 IU/L — ABNORMAL HIGH (ref 0–32)
AST: 49 IU/L — ABNORMAL HIGH (ref 0–40)
Albumin/Globulin Ratio: 1.7 (ref 1.2–2.2)
Albumin: 4 g/dL (ref 3.8–4.8)
Alkaline Phosphatase: 96 IU/L (ref 44–121)
BUN/Creatinine Ratio: 14 (ref 9–23)
BUN: 11 mg/dL (ref 6–20)
Bilirubin Total: 0.7 mg/dL (ref 0.0–1.2)
CO2: 20 mmol/L (ref 20–29)
Calcium: 9.8 mg/dL (ref 8.7–10.2)
Chloride: 98 mmol/L (ref 96–106)
Creatinine, Ser: 0.78 mg/dL (ref 0.57–1.00)
Globulin, Total: 2.3 g/dL (ref 1.5–4.5)
Glucose: 255 mg/dL — ABNORMAL HIGH (ref 65–99)
Potassium: 4.4 mmol/L (ref 3.5–5.2)
Sodium: 132 mmol/L — ABNORMAL LOW (ref 134–144)
Total Protein: 6.3 g/dL (ref 6.0–8.5)
eGFR: 103 mL/min/{1.73_m2} (ref >59)

## 2021-05-10 LAB — LIPID PANEL WITH LDL/HDL RATIO
Cholesterol, Total: 205 mg/dL — ABNORMAL HIGH (ref 100–199)
HDL: 49 mg/dL (ref >39)
LDL Chol Calc (NIH): 129 mg/dL — ABNORMAL HIGH (ref 0–99)
LDL/HDL Ratio: 2.6 ratio (ref 0.0–3.2)
Triglycerides: 149 mg/dL (ref 0–149)
VLDL Cholesterol Cal: 27 mg/dL (ref 5–40)

## 2021-05-10 LAB — MICROALBUMIN / CREATININE URINE RATIO
Creatinine, Urine: 238 mg/dL
Microalb/Creat Ratio: 56 mg/g creat — ABNORMAL HIGH (ref 0–29)
Microalbumin, Urine: 132.3 ug/mL

## 2021-05-10 LAB — HEMOGLOBIN A1C
Est. average glucose Bld gHb Est-mCnc: 249 mg/dL
Hgb A1c MFr Bld: 10.3 % — ABNORMAL HIGH (ref 4.8–5.6)

## 2021-05-10 LAB — TSH: TSH: 8.7 u[IU]/mL — ABNORMAL HIGH (ref 0.450–4.500)

## 2021-05-14 ENCOUNTER — Telehealth: Payer: Self-pay

## 2021-05-14 DIAGNOSIS — E119 Type 2 diabetes mellitus without complications: Secondary | ICD-10-CM

## 2021-05-14 MED ORDER — METFORMIN HCL ER 500 MG PO TB24: ORAL_TABLET | ORAL | 1 refills | Status: DC

## 2021-05-14 MED ORDER — AMLODIPINE BESYLATE 10 MG PO TABS: 10.0000 mg | ORAL_TABLET | Freq: Every day | ORAL | 3 refills | Status: DC

## 2021-05-14 NOTE — Telephone Encounter (Signed)
-----   Message from Tamsen Roers, PA-C sent at 05/14/2021  1:02 PM EDT ----- Normal blood cell counts. Hgb A1C very high at 10.3% and blood sugar 255 indicating diabetes out of control. TSH still high indicating Levothyroxine need to be increased to 100 mcg qd #90. Blood pressure and sugar control may be worse with sinusitis and bronchitis now. Would recommend Ozempic 0.25 mg injected once a week or Rybelsus 3 mg qd for 30 days to help MetforminXR 500 mg 4 tablets daily control diabetes. Should follow up with new PCP in a month to adjust further if needed.

## 2021-05-14 NOTE — Telephone Encounter (Signed)
Patient advised on lab results. Patient is requesting to continue with current medications. She reports that she was not taking her medications daily and has now set up a reminder with her google home to take medications daily. She also reports that she is working on her diet. Patient reports she does not have insurance at the moment and that also would make it hard for her to get new medications. Patient has scheduled a fu.

## 2021-05-27 ENCOUNTER — Other Ambulatory Visit: Payer: Self-pay

## 2021-05-27 ENCOUNTER — Emergency Department
Admission: EM | Admit: 2021-05-27 | Discharge: 2021-05-27 | Disposition: A | Discharge status: Home / Self Care | Payer: Medicaid Other | Attending: Emergency Medicine | Admitting: Emergency Medicine

## 2021-05-27 ENCOUNTER — Emergency Department: Payer: Medicaid Other

## 2021-05-27 DIAGNOSIS — Z79899 Other long term (current) drug therapy: Secondary | ICD-10-CM | POA: Insufficient documentation

## 2021-05-27 DIAGNOSIS — W548XXA Other contact with dog, initial encounter: Secondary | ICD-10-CM | POA: Insufficient documentation

## 2021-05-27 DIAGNOSIS — S060X0A Concussion without loss of consciousness, initial encounter: Secondary | ICD-10-CM

## 2021-05-27 DIAGNOSIS — E039 Hypothyroidism, unspecified: Secondary | ICD-10-CM | POA: Insufficient documentation

## 2021-05-27 DIAGNOSIS — Z7984 Long term (current) use of oral hypoglycemic drugs: Secondary | ICD-10-CM | POA: Insufficient documentation

## 2021-05-27 DIAGNOSIS — S0990XA Unspecified injury of head, initial encounter: Secondary | ICD-10-CM

## 2021-05-27 DIAGNOSIS — Z7952 Long term (current) use of systemic steroids: Secondary | ICD-10-CM | POA: Insufficient documentation

## 2021-05-27 DIAGNOSIS — I129 Hypertensive chronic kidney disease with stage 1 through stage 4 chronic kidney disease, or unspecified chronic kidney disease: Secondary | ICD-10-CM | POA: Insufficient documentation

## 2021-05-27 DIAGNOSIS — E1122 Type 2 diabetes mellitus with diabetic chronic kidney disease: Secondary | ICD-10-CM | POA: Insufficient documentation

## 2021-05-27 DIAGNOSIS — J45909 Unspecified asthma, uncomplicated: Secondary | ICD-10-CM | POA: Insufficient documentation

## 2021-05-27 DIAGNOSIS — N189 Chronic kidney disease, unspecified: Secondary | ICD-10-CM | POA: Insufficient documentation

## 2021-05-27 MED ORDER — ONDANSETRON 4 MG PO TBDP: 4.0000 mg | ORAL_TABLET | Freq: Three times a day (TID) | ORAL | 0 refills | Status: DC | PRN

## 2021-05-27 NOTE — ED Provider Notes (Signed)
Emergency Medicine Provider Triage Evaluation Note  Leah Bright , a 30 y.o. female  was evaluated in triage.  Pt complains of continued headache and nausea after she collided with her large pitbull mix dog's head yesterday.  Patient denies any syncope, or vomiting, but has noted some fatigue and nausea.  Review of Systems  Positive: Headache, nausea Negative: Syncope, distal paresthesias  Physical Exam  BP (!) 145/93 (BP Location: Left Arm)   Pulse 80   Temp 98.2 F (36.8 C) (Oral)   Resp 16   LMP 05/06/2021 (Approximate)   SpO2 99%  Gen:   Awake, no distress  NAD Resp:  Normal effort CTA MSK:   Moves extremities without difficulty  Other:  CVS: RRR  Medical Decision Making  Medically screening exam initiated at 5:01 PM.  Appropriate orders placed.  [name] was informed that the remainder of the evaluation will be completed by another provider, this initial triage assessment does not replace that evaluation, and the importance of remaining in the ED until their evaluation is complete.  Patient ED evaluation of closed head injury after she collided with her dog's head yesterday.   Lissa Hoard, PA-C 05/27/21 1702    Gilles Chiquito, MD 05/27/21 [phone]

## 2021-05-27 NOTE — ED Triage Notes (Signed)
Pt presents to ED from home C/O hitting head on dog's head while playing yesterday. Pt reports dizziness, headache. Denies nausea, vomiting.

## 2021-05-27 NOTE — ED Provider Notes (Addendum)
[facility] Emergency Department Provider Note   ____________________________________________   Event Date/Time   First MD Initiated Contact with Patient 05/27/21 1714     (approximate)  I have reviewed the triage vital signs and the nursing notes.   HISTORY  Chief Complaint Dizziness and Head Injury    HPI Leah Bright is a 30 y.o. female who presents after a head injury that she sustained after her large dog head butted her  LOCATION: Left head DURATION: Occurred last night TIMING: Symptoms have been worsening since onset SEVERITY: Severe QUALITY: Headache, lightheadedness, nausea CONTEXT: Patient states that she was getting on the bed when her approximately 60 pound dog jumped up on the bed as well at full speed and hit her head with his head.  Patient denies any loss of consciousness MODIFYING FACTORS: Noticed that the headache, lightheadedness, and nausea were worse during a kickball game last night and have since partially improved in a dark room with minimal stimuli ASSOCIATED SYMPTOMS: Nausea, lightheadedness   Per medical record review, past medical history noncontributory          Past Medical History:  Diagnosis Date   Agitation    Allergy    Anxiety    Asthma    Chronic kidney disease    Depression    Diabetes mellitus without complication (HCC)    High cholesterol    Hypertension    Hypothyroidism    Left renal artery stenosis (HCC) 09/07/2019   Migraine    OCD (obsessive compulsive disorder)    PTSD (post-traumatic stress disorder)    Renal artery stenosis (HCC)    Right renal atrophy    Social anxiety disorder    Social anxiety disorder    Social phobia    Thyroid disease     Patient Active Problem List   Diagnosis Date Noted   Nausea and vomiting 07/19/2020   Viral upper respiratory tract infection 07/19/2020   Suspected COVID-19 virus infection 07/19/2020   Generalized anxiety disorder 05/21/2020   Mood  disorder (HCC) 01/19/2020   Elevated liver enzymes 01/19/2020   Hordeolum externum of left upper eyelid 10/13/2019   Renal atrophy, right 09/20/2019   History of diabetes mellitus, type II 09/20/2019   Asthma, chronic 09/19/2019   Migraine without aura and without status migrainosus, not intractable 09/18/2019   Hypothyroidism 08/02/2018   Essential hypertension 08/02/2018   Morbid obesity (HCC) 08/02/2018   Type 2 diabetes mellitus without complication, without long-term current use of insulin (HCC) 08/02/2018   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/02/2018   Asthma exacerbation 09/28/2015   CAP (community acquired pneumonia) 09/27/2015   Hypokalemia 09/27/2015   Hypomagnesemia 09/27/2015   Hypoxia 09/27/2015   Sepsis due to pneumonia (HCC) 09/27/2015   Hypertension associated with diabetes (HCC) 09/27/2015   Abdominal pain 01/22/2013   Diarrhea 01/22/2013   Rash 01/22/2013    Past Surgical History:  Procedure Laterality Date   CHOLECYSTECTOMY  2008   endoscopy/colonoscopy   2011   LAPAROSCOPIC GASTRIC BANDING  2007   LAPAROSCOPIC REPAIR AND REMOVAL OF GASTRIC BAND  2011   TONSILLECTOMY  1994   WISDOM TOOTH EXTRACTION      Prior to Admission medications   Medication Sig Start Date End Date Taking? Authorizing Provider  ondansetron (ZOFRAN ODT) 4 MG disintegrating tablet Take 1 tablet (4 mg total) by mouth every 8 (eight) hours as needed for nausea or vomiting. 05/27/21  Yes  Merwyn Katos, MD  albuterol (PROVENTIL) (5 MG/ML) 0.5% nebulizer solution Take 0.5 mLs (2.5 mg total) by nebulization every 6 (six) hours as needed for up to 5 days for wheezing or shortness of breath. 07/19/20 07/24/20  Shirlee Latch, PA-C  albuterol (VENTOLIN HFA) 108 (90 Base) MCG/ACT inhaler Inhale 2 puffs into the lungs every 6 (six) hours as needed for wheezing or shortness of breath. 07/19/20 08/18/20  Eusebio Friendly B, PA-C  amLODipine  (NORVASC) 10 MG tablet Take 1 tablet (10 mg total) by mouth daily. 05/14/21   Chrismon, Jodell Cipro, PA-C  busPIRone (BUSPAR) 5 MG tablet Take 5 mg by mouth 2 (two) times daily.    [provider]  butalbital-acetaminophen-caffeine (FIORICET) 50-325-40 MG tablet Take 1 tablet by mouth every 6 (six) hours as needed for headache. 06/12/20   Anson Fret, MD  fluticasone-salmeterol (ADVAIR) 250-50 MCG/ACT AEPB Inhale 1 puff into the lungs in the morning and at bedtime. 04/29/21   Chrismon, Jodell Cipro, PA-C  levothyroxine (SYNTHROID) 75 MCG tablet Take 1 tablet (75 mcg total) by mouth daily before breakfast. Please go to lab for lab test. 04/29/21   Chrismon, Jodell Cipro, PA-C  lisinopril (ZESTRIL) 40 MG tablet Take 1 tablet (40 mg total) by mouth daily. 01/25/21   Chrismon, Jodell Cipro, PA-C  metFORMIN (GLUCOPHAGE-XR) 500 MG 24 hr tablet TAKE 4 TABLETS BY MOUTH ONCE DAILY 05/14/21   Chrismon, Jodell Cipro, PA-C  metoprolol succinate (TOPROL XL) 25 MG 24 hr tablet Take 1 tablet (25 mg total) by mouth daily. Check heart rate prior and do not take if heart rate is less than 60 beats per minute. 05/21/20   Flinchum, Eula Fried, FNP  norethindrone (MICRONOR) 0.35 MG tablet Take 1 tablet (0.35 mg total) by mouth daily. 07/27/19   Schuman, Jaquelyn Bitter, MD  traZODone (DESYREL) 100 MG tablet Take 150 mg by mouth at bedtime as needed for sleep.    [provider]  venlafaxine XR (EFFEXOR-XR) 75 MG 24 hr capsule Take 150 mg by mouth at bedtime.    [provider]    Allergies Toradol [ketorolac tromethamine], Codeine, Other, Pepto-bismol [bismuth], and Tramadol  Family History  Problem Relation Age of Onset   Cancer Mother        brain tumor   Hypertension Mother    Depression Mother    Diabetes Mother    Migraines Mother        benign tumor, still gets headaches sometimes now    Anxiety disorder Father    Depression Father    Hypertension Maternal Grandmother    Thyroid disease Maternal  Grandmother    Diabetes Maternal Grandmother    Hypertension Maternal Grandfather    Cancer Maternal Grandfather        myositis   High blood pressure Maternal Grandfather    Diabetes Maternal Grandfather    Stroke Maternal Grandfather     Social History Social History   Tobacco Use   Smoking status: Never   Smokeless tobacco: Never  Vaping Use   Vaping Use: Never used  Substance Use Topics   Alcohol use: Yes    Comment: rarely, maybe 1 shot every 2-3 months   Drug use: Never    Review of Systems Constitutional: No fever/chills Eyes: No visual changes. ENT: No sore throat. Cardiovascular: Denies chest pain. Respiratory: Denies shortness of breath. Gastrointestinal: No abdominal pain.  Endorses nausea, no vomiting.  No diarrhea. Genitourinary: Negative for dysuria. Musculoskeletal: Negative for acute arthralgias  Skin: Negative for rash. Neurological: Endorses global headaches with associated lightheadedness, denies weakness/numbness/paresthesias in any extremity Psychiatric: Negative for suicidal ideation/homicidal ideation   ____________________________________________   PHYSICAL EXAM:  VITAL SIGNS: ED Triage Vitals  Enc Vitals Group     BP 05/27/21 1643 (!) 145/93     Pulse Rate 05/27/21 1643 80     Resp 05/27/21 1643 16     Temp 05/27/21 1643 98.2 F (36.8 C)     Temp Source 05/27/21 1643 Oral     SpO2 05/27/21 1643 99 %     Weight --      Height --      Head Circumference --      Peak Flow --      Pain Score 05/27/21 1646 6     Pain Loc --      Pain Edu? --      Excl. in GC? --    Constitutional: Alert and oriented. Well appearing and in no acute distress. Eyes: Conjunctivae are normal. PERRL. Head: Atraumatic. Nose: No congestion/rhinnorhea. Mouth/Throat: Mucous membranes are moist. Neck: No stridor Cardiovascular: Grossly normal heart sounds.  Good peripheral circulation. Respiratory: Normal respiratory effort.  No  retractions. Gastrointestinal: Soft and nontender. No distention. Musculoskeletal: No obvious deformities Neurologic:  Normal speech and language. No gross focal neurologic deficits are appreciated. Skin:  Skin is warm and dry. No rash noted. Psychiatric: Mood and affect are normal. Speech and behavior are normal.  ____________________________________________   LABS (all labs ordered are listed, but only abnormal results are displayed)  Labs Reviewed - No data to display  RADIOLOGY  ED MD interpretation: CT of the head without contrast shows no evidence of acute abnormalities including no intracerebral hemorrhage, obvious masses, or significant edema  Official radiology report(s): No results found.  ____________________________________________   PROCEDURES  Procedure(s) performed (including Critical Care):  Procedures ____________________________________________   INITIAL IMPRESSION / ASSESSMENT AND PLAN / ED COURSE  As part of my medical decision making, I reviewed the following data within the electronic medical record, if available:  Nursing notes reviewed and incorporated, Labs reviewed, EKG interpreted, Old chart reviewed, Radiograph reviewed and Notes from prior ED visits reviewed and incorporated        Patient presenting with head trauma.  Patient's neurological exam was non-focal and unremarkable.  [place] Head CT Rule was applied and patient did not fall into the low risk category so a head CT was obtained.  This showed no significant findings.  At this time, it is felt that the most likely explanation for the patient's symptoms is concussion.   I also considered SAH, SDH, Epidural Hematoma, IPH, skull fracture, migraine but this appears less likely considering the data gathered thus far.   Patient provided concussion protocol.   Patient remained stable and neurologically intact while in the emergency department.  Discussed warning signs that would prompt return to  ED.  Head trauma handout was provided.  Discussed in detail concussion management.  No sports or strenuous activity until symptoms free.  Return to emergency department urgently if new or worsening symptoms develop.    Impression:  Concussion Head injury  Plan  Discharge from ED Tylenol for pain control. Avoid aspirin, NSAIDs, or other blood thinners. Advised patient on supportive measures for cognitive rest - avoid use of cognitive function for at least 24 hours.  This means no tv, books, texting, computers, etc. Limit visitors to the house.  Head trauma instructions provided in discharge instructions Instructed Pt to  monitor for neurologic symptoms, severe HA, change in mental status, seizures, loss of conciousness. Instructed Pt to f/up w/ PCP in 3 days or ETC should symptoms worsen or not improve. Pt verbally expressed understanding and all questions were addressed to Pt's satisfaction.      ____________________________________________   FINAL CLINICAL IMPRESSION(S) / ED DIAGNOSES  Final diagnoses:  Closed head injury, initial encounter  Concussion without loss of consciousness, initial encounter     ED Discharge Orders          Ordered    ondansetron (ZOFRAN ODT) 4 MG disintegrating tablet  Every 8 hours PRN        05/27/21 1748             Note:  This document was prepared using Dragon voice recognition software and may include unintentional dictation errors.    Merwyn Katos, MD 05/27/21 1755    Merwyn Katos, MD 06/10/21 1449    Merwyn Katos, MD 06/12/21 1343

## 2021-06-01 ENCOUNTER — Other Ambulatory Visit: Payer: Self-pay | Admitting: Neurology

## 2021-06-03 ENCOUNTER — Emergency Department
Admission: EM | Admit: 2021-06-03 | Discharge: 2021-06-03 | Disposition: A | Discharge status: Home / Self Care | Payer: Medicaid Other | Attending: Emergency Medicine | Admitting: Emergency Medicine

## 2021-06-03 ENCOUNTER — Ambulatory Visit: Payer: Self-pay

## 2021-06-03 ENCOUNTER — Other Ambulatory Visit: Payer: Self-pay

## 2021-06-03 DIAGNOSIS — Z20822 Contact with and (suspected) exposure to covid-19: Secondary | ICD-10-CM | POA: Insufficient documentation

## 2021-06-03 DIAGNOSIS — E039 Hypothyroidism, unspecified: Secondary | ICD-10-CM | POA: Insufficient documentation

## 2021-06-03 DIAGNOSIS — Z8616 Personal history of COVID-19: Secondary | ICD-10-CM | POA: Insufficient documentation

## 2021-06-03 DIAGNOSIS — Z79899 Other long term (current) drug therapy: Secondary | ICD-10-CM | POA: Insufficient documentation

## 2021-06-03 DIAGNOSIS — Z7951 Long term (current) use of inhaled steroids: Secondary | ICD-10-CM | POA: Insufficient documentation

## 2021-06-03 DIAGNOSIS — Z7984 Long term (current) use of oral hypoglycemic drugs: Secondary | ICD-10-CM | POA: Insufficient documentation

## 2021-06-03 DIAGNOSIS — J069 Acute upper respiratory infection, unspecified: Secondary | ICD-10-CM

## 2021-06-03 DIAGNOSIS — N189 Chronic kidney disease, unspecified: Secondary | ICD-10-CM | POA: Insufficient documentation

## 2021-06-03 DIAGNOSIS — I129 Hypertensive chronic kidney disease with stage 1 through stage 4 chronic kidney disease, or unspecified chronic kidney disease: Secondary | ICD-10-CM | POA: Insufficient documentation

## 2021-06-03 DIAGNOSIS — J45909 Unspecified asthma, uncomplicated: Secondary | ICD-10-CM | POA: Insufficient documentation

## 2021-06-03 DIAGNOSIS — E1122 Type 2 diabetes mellitus with diabetic chronic kidney disease: Secondary | ICD-10-CM | POA: Insufficient documentation

## 2021-06-03 LAB — RESP PANEL BY RT-PCR (FLU A&B, COVID) ARPGX2
Influenza A by PCR: NEGATIVE
Influenza B by PCR: NEGATIVE
SARS Coronavirus 2 by RT PCR: NEGATIVE

## 2021-06-03 MED ORDER — PREDNISONE 20 MG PO TABS: 40.0000 mg | ORAL_TABLET | Freq: Every day | ORAL | 0 refills | Status: AC

## 2021-06-03 MED ORDER — FLUTICASONE PROPIONATE 50 MCG/ACT NA SUSP: 2.0000 | Freq: Every day | NASAL | 0 refills | Status: DC

## 2021-06-03 MED ORDER — BENZONATATE 100 MG PO CAPS
ORAL_CAPSULE | ORAL | 0 refills | Status: DC
Start: 2021-06-03 — End: 2021-06-21

## 2021-06-03 MED ORDER — PSEUDOEPH-BROMPHEN-DM 30-2-10 MG/5ML PO SYRP: 5.0000 mL | ORAL_SOLUTION | Freq: Four times a day (QID) | ORAL | 0 refills | Status: DC | PRN

## 2021-06-03 NOTE — ED Triage Notes (Signed)
Pt comes with c/o cough congestion and body aches. Pt states this all started few days ago. Pt states runny nose as well.

## 2021-06-03 NOTE — Telephone Encounter (Signed)
Reason for Disposition  [1] MILD difficulty breathing (e.g., minimal/no SOB at rest, SOB with walking, pulse <100) AND [2] still present when not coughing  Answer Assessment - Initial Assessment Questions 1. ONSET: "When did the cough begin?"      Friday 2. SEVERITY: "How bad is the cough today?"      Moderate 3. SPUTUM: "Describe the color of your sputum" (none, dry cough; clear, white, yellow, green)     Clear 4. HEMOPTYSIS: "Are you coughing up any blood?" If so ask: "How much?" (flecks, streaks, tablespoons, etc.)     No 5. DIFFICULTY BREATHING: "Are you having difficulty breathing?" If Yes, ask: "How bad is it?" (e.g., mild, moderate, severe)    - MILD: No SOB at rest, mild SOB with walking, speaks normally in sentences, can lie down, no retractions, pulse < 100.    - MODERATE: SOB at rest, SOB with minimal exertion and prefers to sit, cannot lie down flat, speaks in phrases, mild retractions, audible wheezing, pulse 100-120.    - SEVERE: Very SOB at rest, speaks in single words, struggling to breathe, sitting hunched forward, retractions, pulse > 120      Moderate 6. FEVER: "Do you have a fever?" If Yes, ask: "What is your temperature, how was it measured, and when did it start?"     No 7. CARDIAC HISTORY: "Do you have any history of heart disease?" (e.g., heart attack, congestive heart failure)      No 8. LUNG HISTORY: "Do you have any history of lung disease?"  (e.g., pulmonary embolus, asthma, emphysema)     Asthma 9. PE RISK FACTORS: "Do you have a history of blood clots?" (or: recent major surgery, recent prolonged travel, bedridden)     No 10. OTHER SYMPTOMS: "Do you have any other symptoms?" (e.g., runny nose, wheezing, chest pain)       Runny nose, wheezing 11. PREGNANCY: "Is there any chance you are pregnant?" "When was your last menstrual period?"       No 12. TRAVEL: "Have you traveled out of the country in the last month?" (e.g., travel history, exposures)        No  Protocols used: Cough - Acute Productive-A-AH

## 2021-06-03 NOTE — ED Provider Notes (Signed)
[facility] Emergency Department Provider Note ____________________________________________  Time seen: 46  I have reviewed the triage vital signs and the nursing notes.  HISTORY  Chief Complaint  Cough   HPI Leah Bright is a 30 y.o. female presents to the ED for evaluation of cough, congestion, and wheezing.  She has been using her rescue inhaler today.  She reports symptoms began a few days ago but denies any frank fevers, chills, nausea, vomiting, dizziness.  Past Medical History:  Diagnosis Date   Agitation    Allergy    Anxiety    Asthma    Chronic kidney disease    Depression    Diabetes mellitus without complication (HCC)    High cholesterol    Hypertension    Hypothyroidism    Left renal artery stenosis (HCC) 09/07/2019   Migraine    OCD (obsessive compulsive disorder)    PTSD (post-traumatic stress disorder)    Renal artery stenosis (HCC)    Right renal atrophy    Social anxiety disorder    Social anxiety disorder    Social phobia    Thyroid disease     Patient Active Problem List   Diagnosis Date Noted   Nausea and vomiting 07/19/2020   Viral upper respiratory tract infection 07/19/2020   Suspected COVID-19 virus infection 07/19/2020   Generalized anxiety disorder 05/21/2020   Mood disorder (HCC) 01/19/2020   Elevated liver enzymes 01/19/2020   Hordeolum externum of left upper eyelid 10/13/2019   Renal atrophy, right 09/20/2019   History of diabetes mellitus, type II 09/20/2019   Asthma, chronic 09/19/2019   Migraine without aura and without status migrainosus, not intractable 09/18/2019   Hypothyroidism 08/02/2018   Essential hypertension 08/02/2018   Morbid obesity (HCC) 08/02/2018   Type 2 diabetes mellitus without complication, without long-term current use of insulin (HCC) 08/02/2018   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/02/2018   Asthma exacerbation  09/28/2015   CAP (community acquired pneumonia) 09/27/2015   Hypokalemia 09/27/2015   Hypomagnesemia 09/27/2015   Hypoxia 09/27/2015   Sepsis due to pneumonia (HCC) 09/27/2015   Hypertension associated with diabetes (HCC) 09/27/2015   Abdominal pain 01/22/2013   Diarrhea 01/22/2013   Rash 01/22/2013    Past Surgical History:  Procedure Laterality Date   CHOLECYSTECTOMY  2008   endoscopy/colonoscopy   2011   LAPAROSCOPIC GASTRIC BANDING  2007   LAPAROSCOPIC REPAIR AND REMOVAL OF GASTRIC BAND  2011   TONSILLECTOMY  1994   WISDOM TOOTH EXTRACTION      Prior to Admission medications   Medication Sig Start Date End Date Taking? Authorizing Provider  benzonatate (TESSALON PERLES) 100 MG capsule Take 1-2 tabs TID prn cough 06/03/21  Yes Jailen Coward, Charlesetta Ivory, PA-C  brompheniramine-pseudoephedrine-DM 30-2-10 MG/5ML syrup Take 5 mLs by mouth 4 (four) times daily as needed. 06/03/21  Yes Kendalynn Wideman, Charlesetta Ivory, PA-C  fluticasone (FLONASE) 50 MCG/ACT nasal spray Place 2 sprays into both nostrils daily. 06/03/21  Yes Gwendola Hornaday, Charlesetta Ivory, PA-C  predniSONE (DELTASONE) 20 MG tablet Take 2 tablets (40 mg total) by mouth daily with breakfast for 5 days. 06/03/21 06/08/21 Yes Pier Laux, Charlesetta Ivory, PA-C  albuterol (PROVENTIL) (5 MG/ML) 0.5% nebulizer solution Take 0.5 mLs (2.5 mg total) by nebulization every 6 (six) hours as needed for up to 5 days for wheezing or shortness of breath. 07/19/20 07/24/20  Eusebio Friendly B, PA-C  albuterol (VENTOLIN HFA) 108 (90  Base) MCG/ACT inhaler Inhale 2 puffs into the lungs every 6 (six) hours as needed for wheezing or shortness of breath. 07/19/20 08/18/20  Eusebio Friendly B, PA-C  amLODipine (NORVASC) 10 MG tablet Take 1 tablet (10 mg total) by mouth daily. 05/14/21   Chrismon, Jodell Cipro, PA-C  busPIRone (BUSPAR) 5 MG tablet Take 5 mg by mouth 2 (two) times daily.    [provider]  butalbital-acetaminophen-caffeine (FIORICET) 50-325-40 MG tablet Take 1  tablet by mouth every 6 (six) hours as needed for headache. 06/12/20   Anson Fret, MD  fluticasone-salmeterol (ADVAIR) 250-50 MCG/ACT AEPB Inhale 1 puff into the lungs in the morning and at bedtime. 04/29/21   Chrismon, Jodell Cipro, PA-C  levothyroxine (SYNTHROID) 75 MCG tablet Take 1 tablet (75 mcg total) by mouth daily before breakfast. Please go to lab for lab test. 04/29/21   Chrismon, Jodell Cipro, PA-C  lisinopril (ZESTRIL) 40 MG tablet Take 1 tablet (40 mg total) by mouth daily. 01/25/21   Chrismon, Jodell Cipro, PA-C  metFORMIN (GLUCOPHAGE-XR) 500 MG 24 hr tablet TAKE 4 TABLETS BY MOUTH ONCE DAILY 05/14/21   Chrismon, Jodell Cipro, PA-C  metoprolol succinate (TOPROL XL) 25 MG 24 hr tablet Take 1 tablet (25 mg total) by mouth daily. Check heart rate prior and do not take if heart rate is less than 60 beats per minute. 05/21/20   Flinchum, Eula Fried, FNP  norethindrone (MICRONOR) 0.35 MG tablet Take 1 tablet (0.35 mg total) by mouth daily. 07/27/19   Schuman, Jaquelyn Bitter, MD  ondansetron (ZOFRAN ODT) 4 MG disintegrating tablet Take 1 tablet (4 mg total) by mouth every 8 (eight) hours as needed for nausea or vomiting. 05/27/21   Merwyn Katos, MD  traZODone (DESYREL) 100 MG tablet Take 150 mg by mouth at bedtime as needed for sleep.    [provider]  venlafaxine XR (EFFEXOR-XR) 75 MG 24 hr capsule Take 150 mg by mouth at bedtime.    [provider]    Allergies Toradol [ketorolac tromethamine], Codeine, Other, Pepto-bismol [bismuth], and Tramadol  Family History  Problem Relation Age of Onset   Cancer Mother        brain tumor   Hypertension Mother    Depression Mother    Diabetes Mother    Migraines Mother        benign tumor, still gets headaches sometimes now    Anxiety disorder Father    Depression Father    Hypertension Maternal Grandmother    Thyroid disease Maternal Grandmother    Diabetes Maternal Grandmother    Hypertension Maternal Grandfather    Cancer Maternal  Grandfather        myositis   High blood pressure Maternal Grandfather    Diabetes Maternal Grandfather    Stroke Maternal Grandfather     Social History Social History   Tobacco Use   Smoking status: Never   Smokeless tobacco: Never  Vaping Use   Vaping Use: Never used  Substance Use Topics   Alcohol use: Yes    Comment: rarely, maybe 1 shot every 2-3 months   Drug use: Never    Review of Systems  Constitutional: Negative for fever. Eyes: Negative for visual changes. ENT: Negative for sore throat. Cardiovascular: Negative for chest pain. Respiratory: Negative for shortness of breath.  Reports cough and congestion as above. Gastrointestinal: Negative for abdominal pain, vomiting and diarrhea. Genitourinary: Negative for dysuria. Musculoskeletal: Negative for back pain. Skin: Negative for rash. Neurological: Negative for headaches,  focal weakness or numbness. ____________________________________________  PHYSICAL EXAM:  VITAL SIGNS: ED Triage Vitals  Enc Vitals Group     BP 06/03/21 1848 (!) 147/100     Pulse Rate 06/03/21 1848 83     Resp 06/03/21 1848 18     Temp 06/03/21 1848 98.8 F (37.1 C)     Temp src --      SpO2 06/03/21 1848 99 %     Weight --      Height --      Head Circumference --      Peak Flow --      Pain Score 06/03/21 1847 5     Pain Loc --      Pain Edu? --      Excl. in GC? --     Constitutional: Alert and oriented. Well appearing and in no distress. Head: Normocephalic and atraumatic. Eyes: Conjunctivae are normal. Normal extraocular movements Neck: Supple. No thyromegaly. Hematological/Lymphatic/Immunological: No cervical lymphadenopathy. Cardiovascular: Normal rate, regular rhythm. Normal distal pulses. Respiratory: Normal respiratory effort.  Mild expiratory wheezes. No rales/rhonchi. Gastrointestinal: Soft and nontender. No distention. Musculoskeletal: Nontender with normal range of motion in all extremities.  Neurologic:   Normal gait without ataxia. Normal speech and language. No gross focal neurologic deficits are appreciated. Skin:  Skin is warm, dry and intact. No rash noted. Psychiatric: Mood and affect are normal. Patient exhibits appropriate insight and judgment. ____________________________________________    {LABS (pertinent positives/negatives) Labs Reviewed  RESP PANEL BY RT-PCR (FLU A&B, COVID) ARPGX2    ____________________________________________  {EKG  ____________________________________________   RADIOLOGY Official radiology report(s): No results found. ____________________________________________  PROCEDURES   Procedures ____________________________________________   INITIAL IMPRESSION / ASSESSMENT AND PLAN / ED COURSE  As part of my medical decision making, I reviewed the following data within the electronic MEDICAL RECORD NUMBER Labs reviewed pending and Notes from prior ED visits   DDX: viral URI, Covid, influenza  ED evaluation of several days of cough, congestion, and mild intermittent wheeze requiring her albuterol inhaler.  She presents stable condition with no signs of acute respiratory stress, dehydration, or toxic appearance.  Patient has requested testing and will await her viral panel results.  She will be treated empirically with Tessalon Perles, Bromfed syrup, Flonase, and a steroid taper.  She will follow with primary provider for ongoing symptoms.  Return precautions have been reviewed.  [name] was evaluated in Emergency Department on 06/03/2021 for the symptoms described in the history of present illness. She was evaluated in the context of the global COVID-19 pandemic, which necessitated consideration that the patient might be at risk for infection with the SARS-CoV-2 virus that causes COVID-19. Institutional protocols and algorithms that pertain to the evaluation of patients at risk for COVID-19 are in a state of rapid change based on information released by  regulatory bodies including the CDC and federal and state organizations. These policies and algorithms were followed during the patient's care in the ED.  ____________________________________________  FINAL CLINICAL IMPRESSION(S) / ED DIAGNOSES  Final diagnoses:  Viral URI with cough      Renner Sebald, Charlesetta Ivory, PA-C 06/03/21 1905    Gilles Chiquito, MD 06/03/21 2047

## 2021-06-03 NOTE — Telephone Encounter (Signed)
Pt. Reports she started coughing Friday. Productive at times - white mucus. History of asthma. Has runny nose and wheezing. Has not done a COVID 19 Test. Has a nebulizer, but reports "the medicine for it is outdated." No availability in the practice. Instructed pt. To go to UC. Verbalizes understanding.

## 2021-06-03 NOTE — Discharge Instructions (Addendum)
Take the prescription meds as prescribed.  Follow-up with your primary provider for ongoing symptoms.  Return to the ED if needed.

## 2021-06-10 ENCOUNTER — Telehealth: Payer: Self-pay | Admitting: Family Medicine

## 2021-06-10 DIAGNOSIS — E039 Hypothyroidism, unspecified: Secondary | ICD-10-CM

## 2021-06-10 NOTE — Telephone Encounter (Signed)
[facility] faxed refill request for the following medications:    levothyroxine (SYNTHROID) 75 MCG tablet   Please advise.  

## 2021-06-11 MED ORDER — LEVOTHYROXINE SODIUM 100 MCG PO TABS: 100.0000 ug | ORAL_TABLET | Freq: Every day | ORAL | 0 refills | Status: DC

## 2021-06-11 NOTE — Telephone Encounter (Signed)
Leah Cipro Chrismon, PA-C  05/14/2021  1:02 PM EDT     Normal blood cell counts. Hgb A1C very high at 10.3% and blood sugar 255 indicating diabetes out of control. TSH still high indicating Levothyroxine need to be increased to 100 mcg qd #90. Blood pressure and sugar control may be worse with sinusitis and bronchitis now. Would recommend Ozempic 0.25 mg injected once a week or Rybelsus 3 mg qd for 30 days to help MetforminXR 500 mg 4 tablets daily control diabetes. Should follow up with new PCP in a month to adjust further if needed.   Will send in refill per Maurine Minister at . KW

## 2021-06-21 ENCOUNTER — Ambulatory Visit (INDEPENDENT_AMBULATORY_CARE_PROVIDER_SITE_OTHER): Payer: Self-pay | Admitting: Family Medicine

## 2021-06-21 ENCOUNTER — Other Ambulatory Visit: Payer: Self-pay

## 2021-06-21 ENCOUNTER — Encounter: Payer: Self-pay | Admitting: Family Medicine

## 2021-06-21 VITALS — BP 151/92 | HR 66 | Temp 98.6°F | Resp 18 | Wt 294.6 lb

## 2021-06-21 DIAGNOSIS — I10 Essential (primary) hypertension: Secondary | ICD-10-CM

## 2021-06-21 DIAGNOSIS — F411 Generalized anxiety disorder: Secondary | ICD-10-CM

## 2021-06-21 DIAGNOSIS — E119 Type 2 diabetes mellitus without complications: Secondary | ICD-10-CM

## 2021-06-21 DIAGNOSIS — F39 Unspecified mood [affective] disorder: Secondary | ICD-10-CM

## 2021-06-21 MED ORDER — BUTALBITAL-APAP-CAFFEINE 50-325-40 MG PO TABS: 1.0000 | ORAL_TABLET | Freq: Four times a day (QID) | ORAL | 1 refills | Status: DC | PRN

## 2021-06-21 NOTE — Progress Notes (Signed)
Established patient visit   Patient: Leah Bright   DOB: 1991-02-04   30 y.o. Female  MRN: [id number] Visit Date: 06/21/2021  Today's healthcare provider: Gwyneth Sprout, FNP   Chief Complaint  Patient presents with   Diabetes   Hypothyroidism   Subjective    HPI  Diabetes Mellitus Type II, Follow-up  Lab Results  Component Value Date   HGBA1C 10.3 (H) 05/09/2021   HGBA1C 8.0 (A) 05/21/2020   HGBA1C 8.2 (H) 03/19/2020   Wt Readings from Last 3 Encounters:  06/21/21 294 lb 9.6 oz (133.6 kg)  04/29/21 289 lb (131.1 kg)  04/22/21 296 lb 6.4 oz (134.4 kg)   Last seen for diabetes 2 months ago. Labs done 05/09/21 where management was changed Management since then includes recommend Ozempic 0.25 mg injected once a week or Rybelsus 3 mg qd for 30 days to help MetforminXR 500 mg 4 tablets daily control diabetes. She reports fair compliance with treatment. She is not having side effects.  Symptoms: Yes fatigue No foot ulcerations  No appetite changes Yes nausea  No paresthesia of the feet  No polydipsia  No polyuria No visual disturbances   No vomiting     Home blood sugar records:  not being checked  Episodes of hypoglycemia? No    Current insulin regiment: patient reports that she is not on insulin Most Recent Eye Exam: 07/12/2019 Current exercise: walking Current diet habits: well balanced  Pertinent Labs: Lab Results  Component Value Date   CHOL 205 (H) 05/09/2021   HDL 49 05/09/2021   LDLCALC 129 (H) 05/09/2021   TRIG 149 05/09/2021   CHOLHDL 4.0 07/19/2019   Lab Results  Component Value Date   NA 132 (L) 05/09/2021   K 4.4 05/09/2021   CREATININE 0.78 05/09/2021   EGFR 103 05/09/2021   GFRNONAA >60 08/17/2020   GLUCOSE 255 (H) 05/09/2021     ---------------------------------------------------------------------------------------------------  Hypothyroid, follow-up  Lab Results  Component Value Date   TSH 8.700 (H) 05/09/2021   TSH 6.330 (H)  03/19/2020   TSH 10.500 (H) 07/19/2019   T4TOTAL 7.6 07/19/2019   Wt Readings from Last 3 Encounters:  06/21/21 294 lb 9.6 oz (133.6 kg)  04/29/21 289 lb (131.1 kg)  04/22/21 296 lb 6.4 oz (134.4 kg)    She was last seen for hypothyroid 2 months ago. Labs done 05/09/21 where management was changed Management since that visit includes levothyroxine need to be increased to 100 mcg qd. She reports excellent compliance with treatment. She is not having side effects.   Symptoms: No change in energy level No constipation  No diarrhea No heat / cold intolerance  No nervousness No palpitations  No weight changes    -----------------------------------------------------------------------------------------     Medications: Outpatient Medications Prior to Visit  Medication Sig Note   amLODipine (NORVASC) 10 MG tablet Take 1 tablet (10 mg total) by mouth daily.    busPIRone (BUSPAR) 5 MG tablet Take 5 mg by mouth 2 (two) times daily.    fluticasone (FLONASE) 50 MCG/ACT nasal spray Place 2 sprays into both nostrils daily.    fluticasone-salmeterol (ADVAIR) 250-50 MCG/ACT AEPB Inhale 1 puff into the lungs in the morning and at bedtime.    levothyroxine (SYNTHROID) 100 MCG tablet Take 1 tablet (100 mcg total) by mouth daily before breakfast. Please go to lab for lab test.    metFORMIN (GLUCOPHAGE-XR) 500 MG 24 hr tablet TAKE 4 TABLETS BY MOUTH ONCE DAILY  ondansetron (ZOFRAN ODT) 4 MG disintegrating tablet Take 1 tablet (4 mg total) by mouth every 8 (eight) hours as needed for nausea or vomiting.    traZODone (DESYREL) 100 MG tablet Take 150 mg by mouth at bedtime as needed for sleep.    venlafaxine XR (EFFEXOR-XR) 75 MG 24 hr capsule Take 150 mg by mouth at bedtime. 06/21/2021: Patient reports that she takes 274m   albuterol (PROVENTIL) (5 MG/ML) 0.5% nebulizer solution Take 0.5 mLs (2.5 mg total) by nebulization every 6 (six) hours as needed for up to 5 days for wheezing or shortness of  breath.    albuterol (VENTOLIN HFA) 108 (90 Base) MCG/ACT inhaler Inhale 2 puffs into the lungs every 6 (six) hours as needed for wheezing or shortness of breath.    [DISCONTINUED] benzonatate (TESSALON PERLES) 100 MG capsule Take 1-2 tabs TID prn cough    [DISCONTINUED] brompheniramine-pseudoephedrine-DM 30-2-10 MG/5ML syrup Take 5 mLs by mouth 4 (four) times daily as needed.    [DISCONTINUED] butalbital-acetaminophen-caffeine (FIORICET) 50-325-40 MG tablet Take 1 tablet by mouth every 6 (six) hours as needed for headache.    [DISCONTINUED] lisinopril (ZESTRIL) 40 MG tablet Take 1 tablet (40 mg total) by mouth daily.    [DISCONTINUED] metoprolol succinate (TOPROL XL) 25 MG 24 hr tablet Take 1 tablet (25 mg total) by mouth daily. Check heart rate prior and do not take if heart rate is less than 60 beats per minute.    [DISCONTINUED] norethindrone (MICRONOR) 0.35 MG tablet Take 1 tablet (0.35 mg total) by mouth daily.    No facility-administered medications prior to visit.    Review of Systems     Objective    BP (!) 151/92   Pulse 66   Temp 98.6 F (37 C) (Oral)   Resp 18   Wt 294 lb 9.6 oz (133.6 kg)   BMI 49.02 kg/m  {Show previous vital signs (optional):23777}  Physical Exam Vitals and nursing note reviewed.  Constitutional:      General: She is not in acute distress.    Appearance: Normal appearance. She is obese. She is not ill-appearing, toxic-appearing or diaphoretic.  HENT:     Head: Normocephalic and atraumatic.  Cardiovascular:     Rate and Rhythm: Normal rate and regular rhythm.     Pulses: Normal pulses.     Heart sounds: Normal heart sounds. No murmur heard.   No friction rub. No gallop.  Pulmonary:     Effort: Pulmonary effort is normal. No respiratory distress.     Breath sounds: Normal breath sounds. No stridor. No wheezing, rhonchi or rales.  Chest:     Chest wall: No tenderness.  Abdominal:     General: Bowel sounds are normal.     Palpations: Abdomen  is soft.  Musculoskeletal:        General: No swelling, tenderness, deformity or signs of injury. Normal range of motion.     Right lower leg: No edema.     Left lower leg: No edema.  Skin:    General: Skin is warm and dry.     Capillary Refill: Capillary refill takes less than 2 seconds.     Coloration: Skin is not jaundiced or pale.     Findings: No bruising, erythema, lesion or rash.  Neurological:     General: No focal deficit present.     Mental Status: She is alert and oriented to person, place, and time. Mental status is at baseline.     Cranial Nerves:  No cranial nerve deficit.     Sensory: No sensory deficit.     Motor: No weakness.     Coordination: Coordination normal.  Psychiatric:        Mood and Affect: Mood is anxious and depressed. Affect is flat.        Speech: Speech is delayed.        Behavior: Behavior is slowed and withdrawn.        Thought Content: Thought content is paranoid.        Judgment: Judgment normal.     No results found for any visits on 06/21/21.  Assessment & Plan     Problem List Items Addressed This Visit       Cardiovascular and Mediastinum   Essential hypertension    Followed by [facility] in [place] today Reports taking additional BP medication; however, unable to pull records/recall name Encouraged to bring medications with her to appt        Endocrine   Type 2 diabetes mellitus without complication, without long-term current use of insulin (HCC) - Primary    Afraid of needles; glucometer out of batteries/does not have the funds to get new batteries Denies highs/lows Continue regimen Reports 'too busy' to eat morning meal and take medication Lunch today was 2 pickles and milk Advised to speak with dietician for dm mgmt- refused d/t self pay status- will revisit once insured         Other   Morbid obesity ([place])    BMI 49 -htn -dm -depression/anxiety Discussed importance of healthy weight  management Discussed diet and exercise       Mood disorder ([place])    Continue to monitor anxiety/depression      Generalized anxiety disorder    Reports appts can be difficult d/t social anxiety Continue to work on triggers Suggest journaling as way to address stressors         Return in about 8 weeks (around 08/16/2021) for chonic disease management.     Vonna Kotyk, FNP, have reviewed all documentation for this visit. The documentation on 06/21/21 for the exam, diagnosis, procedures, and orders are all accurate and complete.    Gwyneth Sprout, [place] [phone] (phone) [phone] (fax)  [place]

## 2021-06-21 NOTE — Assessment & Plan Note (Signed)
Reports appts can be difficult d/t social anxiety Continue to work on Lawyer as way to address stressors

## 2021-06-21 NOTE — Assessment & Plan Note (Signed)
BMI 49 -htn -dm -depression/anxiety Discussed importance of healthy weight management Discussed diet and exercise

## 2021-06-21 NOTE — Assessment & Plan Note (Signed)
Continue to monitor anxiety/depression

## 2021-06-21 NOTE — Assessment & Plan Note (Signed)
Afraid of needles; glucometer out of batteries/does not have the funds to get new batteries Denies highs/lows Continue regimen Reports 'too busy' to eat morning meal and take medication Lunch today was 2 pickles and milk Advised to speak with dietician for dm mgmt- refused d/t self pay status- will revisit once insured

## 2021-06-21 NOTE — Assessment & Plan Note (Signed)
Followed by [facility] in [place] today Reports taking additional BP medication; however, unable to pull records/recall name Encouraged to bring medications with her to appt

## 2021-07-18 ENCOUNTER — Ambulatory Visit: Payer: Self-pay

## 2021-07-18 NOTE — Telephone Encounter (Signed)
Please advise 

## 2021-07-18 NOTE — Telephone Encounter (Signed)
Patient advised to go to [facility] located on [address] which is open till 8PM or to [facility] ED, patient agreed and stated that if she dosent make it tonight she will go first thing in the morning. KW

## 2021-07-18 NOTE — Telephone Encounter (Signed)
Pt. Reports she has had a migraine on and off since Saturday. Has taken Fioricet and Excedrin and "nothing is really helping." Has tried cool and warm compresses. Has dizziness at times. Requests appointment. Will forward triage per [facility] in the practice. Please advise pt.     Answer Assessment - Initial Assessment Questions 1. LOCATION: "Where does it hurt?"      Back of head 2. ONSET: "When did the headache start?" (Minutes, hours or days)      Saturday 3. PATTERN: "Does the pain come and go, or has it been constant since it started?"     Comes and goes 4. SEVERITY: "How bad is the pain?" and "What does it keep you from doing?"  (e.g., Scale 1-10; mild, moderate, or severe)   - MILD (1-3): doesn't interfere with normal activities    - MODERATE (4-7): interferes with normal activities or awakens from sleep    - SEVERE (8-10): excruciating pain, unable to do any normal activities        Now - 7-8 5. RECURRENT SYMPTOM: "Have you ever had headaches before?" If Yes, ask: "When was the last time?" and "What happened that time?"      Yes 6. CAUSE: "What do you think is causing the headache?"     Migraine 7. MIGRAINE: "Have you been diagnosed with migraine headaches?" If Yes, ask: "Is this headache similar?"      Yes 8. HEAD INJURY: "Has there been any recent injury to the head?"      No 9. OTHER SYMPTOMS: "Do you have any other symptoms?" (fever, stiff neck, eye pain, sore throat, cold symptoms)     Dizziness 10. PREGNANCY: "Is there any chance you are pregnant?" "When was your last menstrual period?"       No  Protocols used: Headache-A-AH

## 2021-07-24 ENCOUNTER — Emergency Department: Payer: Self-pay

## 2021-07-24 ENCOUNTER — Other Ambulatory Visit: Payer: Self-pay

## 2021-07-24 ENCOUNTER — Emergency Department
Admission: EM | Admit: 2021-07-24 | Discharge: 2021-07-24 | Disposition: A | Discharge status: Home / Self Care | Payer: Self-pay | Attending: Emergency Medicine | Admitting: Emergency Medicine

## 2021-07-24 DIAGNOSIS — G43819 Other migraine, intractable, without status migrainosus: Secondary | ICD-10-CM | POA: Insufficient documentation

## 2021-07-24 DIAGNOSIS — Z7984 Long term (current) use of oral hypoglycemic drugs: Secondary | ICD-10-CM | POA: Insufficient documentation

## 2021-07-24 DIAGNOSIS — N189 Chronic kidney disease, unspecified: Secondary | ICD-10-CM | POA: Insufficient documentation

## 2021-07-24 DIAGNOSIS — Z79899 Other long term (current) drug therapy: Secondary | ICD-10-CM | POA: Insufficient documentation

## 2021-07-24 DIAGNOSIS — E1122 Type 2 diabetes mellitus with diabetic chronic kidney disease: Secondary | ICD-10-CM | POA: Insufficient documentation

## 2021-07-24 DIAGNOSIS — E1159 Type 2 diabetes mellitus with other circulatory complications: Secondary | ICD-10-CM | POA: Insufficient documentation

## 2021-07-24 DIAGNOSIS — I129 Hypertensive chronic kidney disease with stage 1 through stage 4 chronic kidney disease, or unspecified chronic kidney disease: Secondary | ICD-10-CM | POA: Insufficient documentation

## 2021-07-24 DIAGNOSIS — J45909 Unspecified asthma, uncomplicated: Secondary | ICD-10-CM | POA: Insufficient documentation

## 2021-07-24 LAB — CBC WITH DIFFERENTIAL/PLATELET
Abs Immature Granulocytes: 0.01 10*3/uL (ref 0.00–0.07)
Basophils Absolute: 0.1 10*3/uL (ref 0.0–0.1)
Basophils Relative: 1 %
Eosinophils Absolute: 0.1 10*3/uL (ref 0.0–0.5)
Eosinophils Relative: 1 %
HCT: 40.9 % (ref 36.0–46.0)
Hemoglobin: 14 g/dL (ref 12.0–15.0)
Immature Granulocytes: 0 %
Lymphocytes Relative: 42 %
Lymphs Abs: 3.4 10*3/uL (ref 0.7–4.0)
MCH: 26.7 pg (ref 26.0–34.0)
MCHC: 34.2 g/dL (ref 30.0–36.0)
MCV: 78.1 fL — ABNORMAL LOW (ref 80.0–100.0)
Monocytes Absolute: 0.4 10*3/uL (ref 0.1–1.0)
Monocytes Relative: 5 %
Neutro Abs: 4.1 10*3/uL (ref 1.7–7.7)
Neutrophils Relative %: 51 %
Platelets: 304 10*3/uL (ref 150–400)
RBC: 5.24 MIL/uL — ABNORMAL HIGH (ref 3.87–5.11)
RDW: 13.2 % (ref 11.5–15.5)
WBC: 8.1 10*3/uL (ref 4.0–10.5)
nRBC: 0 % (ref 0.0–0.2)

## 2021-07-24 LAB — COMPREHENSIVE METABOLIC PANEL
ALT: 45 U/L — ABNORMAL HIGH (ref 0–44)
AST: 38 U/L (ref 15–41)
Albumin: 4.1 g/dL (ref 3.5–5.0)
Alkaline Phosphatase: 81 U/L (ref 38–126)
Anion gap: 8 (ref 5–15)
BUN: 8 mg/dL (ref 6–20)
CO2: 23 mmol/L (ref 22–32)
Calcium: 9 mg/dL (ref 8.9–10.3)
Chloride: 102 mmol/L (ref 98–111)
Creatinine, Ser: 0.67 mg/dL (ref 0.44–1.00)
GFR, Estimated: >60 mL/min (ref >60)
Glucose, Bld: 286 mg/dL — ABNORMAL HIGH (ref 70–99)
Potassium: 4.2 mmol/L (ref 3.5–5.1)
Sodium: 133 mmol/L — ABNORMAL LOW (ref 135–145)
Total Bilirubin: 0.7 mg/dL (ref 0.3–1.2)
Total Protein: 7.7 g/dL (ref 6.5–8.1)

## 2021-07-24 LAB — POC URINE PREG, ED: Preg Test, Ur: NEGATIVE

## 2021-07-24 MED ORDER — PROCHLORPERAZINE EDISYLATE 10 MG/2ML IJ SOLN
10.0000 mg | Freq: Once | INTRAMUSCULAR | Status: AC
  Administered 2021-07-24: 10 mg via INTRAVENOUS
  Filled 2021-07-24: qty 2

## 2021-07-24 MED ORDER — ONDANSETRON 4 MG PO TBDP: 4.0000 mg | ORAL_TABLET | Freq: Three times a day (TID) | ORAL | 0 refills | Status: DC | PRN

## 2021-07-24 MED ORDER — DIPHENHYDRAMINE HCL 50 MG/ML IJ SOLN
25.0000 mg | Freq: Once | INTRAMUSCULAR | Status: AC
  Administered 2021-07-24: 25 mg via INTRAVENOUS
  Filled 2021-07-24: qty 1

## 2021-07-24 NOTE — Discharge Instructions (Addendum)
Return to the ER for fevers, worsening pain or any other concerns.  You could take up to 4 g of Tylenol in a day so make sure that you are not using too much Excedrin because this has Tylenol in it.

## 2021-07-24 NOTE — ED Provider Notes (Signed)
[facility] Emergency Department Provider Note  ____________________________________________   Event Date/Time   First MD Initiated Contact with Patient 07/24/21 1958     (approximate)  I have reviewed the triage vital signs and the nursing notes.   HISTORY  Chief Complaint Migraine    HPI Leah Bright is a 30 y.o. female with diabetes, hypertension who comes in with concerns for migraines.  Patient reports 1 week of constantly gradually getting worse headaches.  Reports it is in her back of her head, not better with trying Excedrin, Fioricet at home.  She states that this headache is lasting longer than any of her other headaches.  Denies any vision changes.  Reports feeling some palpitations.  She does report that the headaches have been associate with some vomiting as well.       Past Medical History:  Diagnosis Date   Agitation    Allergy    Anxiety    Asthma    Chronic kidney disease    Depression    Diabetes mellitus without complication (HCC)    High cholesterol    Hypertension    Hypothyroidism    Left renal artery stenosis (HCC) 09/07/2019   Migraine    OCD (obsessive compulsive disorder)    PTSD (post-traumatic stress disorder)    Renal artery stenosis (HCC)    Right renal atrophy    Social anxiety disorder    Social anxiety disorder    Social phobia    Thyroid disease     Patient Active Problem List   Diagnosis Date Noted   Generalized anxiety disorder 05/21/2020   Mood disorder (HCC) 01/19/2020   Elevated liver enzymes 01/19/2020   Hordeolum externum of left upper eyelid 10/13/2019   History of diabetes mellitus, type II 09/20/2019   Migraine without aura and without status migrainosus, not intractable 09/18/2019   Hypothyroidism 08/02/2018   Essential hypertension 08/02/2018   Morbid obesity (HCC) 08/02/2018   Type 2 diabetes mellitus without complication, without long-term current use of insulin (HCC) 08/02/2018    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 Authorizing Provider  albuterol (PROVENTIL) (5 MG/ML) 0.5% nebulizer solution Take 0.5 mLs (2.5 mg total) by nebulization every 6 (six) hours as needed for up to 5 days for wheezing or shortness of breath. 07/19/20 07/24/20  Shirlee Latch, PA-C  albuterol (VENTOLIN HFA) 108 (90 Base) MCG/ACT inhaler Inhale 2 puffs into the lungs every 6 (six) hours as needed for wheezing or shortness of breath. 07/19/20 08/18/20  Eusebio Friendly B, PA-C  amLODipine (NORVASC) 10 MG tablet Take 1 tablet (10 mg total) by mouth daily. 05/14/21   Chrismon, Jodell Cipro, PA-C  busPIRone (BUSPAR) 5 MG tablet Take 5 mg by mouth 2 (two) times daily.    [provider]  butalbital-acetaminophen-caffeine (FIORICET) 50-325-40 MG tablet Take 1 tablet by mouth every 6 (six) hours as needed for headache. 06/21/21  Jacky Kindle, FNP  fluticasone [facility]) 50 MCG/ACT nasal spray Place 2 sprays into both nostrils daily. 06/03/21   Menshew, Charlesetta Ivory, PA-C  fluticasone-salmeterol (ADVAIR) 250-50 MCG/ACT AEPB Inhale 1 puff into the lungs in the morning and at bedtime. 04/29/21   Chrismon, Jodell Cipro, PA-C  levothyroxine (SYNTHROID) 100 MCG tablet Take 1 tablet (100 mcg total) by mouth daily before breakfast. Please go to lab for lab test. 06/11/21   Jacky Kindle, FNP  metFORMIN (GLUCOPHAGE-XR) 500 MG 24 hr tablet TAKE 4 TABLETS BY  MOUTH ONCE DAILY 05/14/21   Chrismon, Jodell Cipro, PA-C  ondansetron (ZOFRAN ODT) 4 MG disintegrating tablet Take 1 tablet (4 mg total) by mouth every 8 (eight) hours as needed for nausea or vomiting. 05/27/21   Merwyn Katos, MD  traZODone (DESYREL) 100 MG tablet Take 150 mg by mouth at bedtime as needed for sleep.    [provider]  venlafaxine XR (EFFEXOR-XR) 75 MG 24 hr capsule Take 150 mg by mouth at bedtime.    [provider]    Allergies Toradol [ketorolac tromethamine], Codeine, Other, Pepto-bismol [bismuth], and Tramadol  Family History  Problem Relation Age of Onset   Cancer Mother        brain tumor   Hypertension Mother    Depression Mother    Diabetes Mother    Migraines Mother        benign tumor, still gets headaches sometimes now    Anxiety disorder Father    Depression Father    Hypertension Maternal Grandmother    Thyroid disease Maternal Grandmother    Diabetes Maternal Grandmother    Hypertension Maternal Grandfather    Cancer Maternal Grandfather        myositis   High blood pressure Maternal Grandfather    Diabetes Maternal Grandfather    Stroke Maternal Grandfather     Social History Social History   Tobacco Use   Smoking status: Never   Smokeless tobacco: Never  Vaping Use   Vaping Use: Never used  Substance Use Topics   Alcohol use: Yes    Comment: rarely, maybe 1 shot every 2-3 months   Drug use: Never      Review of Systems Constitutional: No fever/chills Eyes: No visual changes. ENT: No sore throat. Cardiovascular: Denies chest pain.  Palpitations Respiratory: Denies shortness of breath. Gastrointestinal: No abdominal pain.  No nausea, no vomiting.  No diarrhea.  No constipation. Genitourinary: Negative for dysuria. Musculoskeletal: Negative for back pain. Skin: Negative for rash. Neurological: Positive headache, no focal weakness or numbness. All other ROS  negative ____________________________________________   PHYSICAL EXAM:  VITAL SIGNS: ED Triage Vitals  Enc Vitals Group     BP 07/24/21 1832 (!) 172/111     Pulse Rate 07/24/21 1829 (!) 101     Resp 07/24/21 1829 18     Temp 07/24/21 1829 98 F (36.7 C)     Temp Source 07/24/21 1829 Oral     SpO2 07/24/21 1829 95 %     Weight 07/24/21 1832 294 lb (133.4 kg)     Height 07/24/21 1829 5\' 4"  (1.626 m)     Head Circumference --      Peak Flow --      Pain Score 07/24/21 1832 8     Pain Loc --      Pain Edu? --      Excl. in GC? --     Constitutional: Alert and oriented.  Patient is  sitting in a dark room, tearful Eyes: Conjunctivae are normal. EOMI. pupils reactive bilaterally Head: Atraumatic. Nose: No congestion/rhinnorhea. Mouth/Throat: Mucous membranes are moist.   Neck: No stridor. Trachea Midline. FROM Cardiovascular: Normal rate, regular rhythm. Grossly normal heart sounds.  Good peripheral circulation. Respiratory: Normal respiratory effort.  No retractions. Lungs CTAB. Gastrointestinal: Soft and nontender. No distention. No abdominal bruits.  Musculoskeletal: No lower extremity tenderness nor edema.  No joint effusions. Neurologic:  Normal speech and language. No gross focal neurologic deficits are appreciated.  Cranial nerves appear intact.  Equal strength in arms and legs. Skin:  Skin is warm, dry and intact. No rash noted. Psychiatric: Mood and affect are normal. Speech and behavior are normal. GU: Deferred   ____________________________________________   LABS (all labs ordered are listed, but only abnormal results are displayed)  Labs Reviewed  CBC WITH DIFFERENTIAL/PLATELET - Abnormal; Notable for the following components:      Result Value   RBC 5.24 (*)    MCV 78.1 (*)    All other components within normal limits  COMPREHENSIVE METABOLIC PANEL - Abnormal; Notable for the following components:   Sodium 133 (*)    Glucose, Bld 286 (*)    ALT 45 (*)     All other components within normal limits  POC URINE PREG, ED   ____________________________________________   ED ECG REPORT I, Concha Se, the attending physician, personally viewed and interpreted this ECG.  Normal sinus rate of 75, no ST elevation, no T wave versions, normal intervals ____________________________________________  RADIOLOGY   Official radiology report(s): CT HEAD WO CONTRAST ( )  Result Date: 07/24/2021 CLINICAL DATA:  Headache EXAM: CT HEAD WITHOUT CONTRAST TECHNIQUE: Contiguous axial images were obtained from the base of the skull through the vertex without intravenous contrast. COMPARISON:  CT 05/27/2021 FINDINGS: Brain: No evidence of acute infarction, hemorrhage, hydrocephalus, extra-axial collection or mass lesion/mass effect. Vascular: No hyperdense vessel or unexpected calcification. Skull: Normal. Negative for fracture or focal lesion. Sinuses/Orbits: No acute finding. Other: None IMPRESSION: Negative non contrasted CT appearance of the brain Electronically Signed   By: Jasmine Pang M.D.   On: 07/24/2021 20:41    ____________________________________________   PROCEDURES  Procedure(s) performed (including Critical Care):  Procedures   ____________________________________________   INITIAL IMPRESSION / ASSESSMENT AND PLAN / ED COURSE  Leah Bright was evaluated in Emergency Department on 07/24/2021 for the symptoms described in the history of present illness. She was evaluated in the context of the global COVID-19 pandemic, which necessitated consideration that the patient might be at risk for infection with the SARS-CoV-2 virus that causes COVID-19. Institutional protocols and algorithms that pertain to the evaluation of patients at risk for COVID-19 are in a state of rapid change based on information released by regulatory bodies including the CDC and federal and state organizations. These policies and algorithms were followed during the  patient's care in the ED.    Patient comes in with migraine.  Reports it is severe.  Will get migraine cocktail.  Given patient was significantly hypertensive will get CT scan of extremity intercranial hemorrhage, edema but seems more likely to be related to one of her chronic migraines.  We will get labs to make sure of his pregnancy, DKA, dehydration.  Labs show slightly elevated glucose of 286, around her baseline but no evidence of DKA.  No anemia.  CT imaging was negative.  EKG without evidence of any arrhythmia.  9:33 PM reevaluated patient and she is feeling better.  Offered her additional medications but she declines and that she felt like she could go home.  We will give her some Zofran to help at home and she will follow-up with a neurologist to be started on medications to help prevent headaches.  Patient has been taking a lot of Fioricet I did discuss the dangers of rebound headaches related to this.      ____________________________________________   FINAL CLINICAL IMPRESSION(S) / ED DIAGNOSES   Final diagnoses:  Other migraine without status migrainosus, intractable      MEDICATIONS GIVEN DURING THIS VISIT:  Medications  prochlorperazine (COMPAZINE) injection 10 mg (has no administration in time range)  diphenhydrAMINE (BENADRYL) injection 25 mg (has no administration in time range)     ED Discharge Orders     None        Note:  This document was prepared using Dragon voice recognition software and may include unintentional dictation errors.    Concha Se, MD 07/24/21 2135

## 2021-07-24 NOTE — ED Triage Notes (Signed)
Pt c/o right sided HA with N/V, states she has a hx of migraines , states she has been taking Fioricet and Excedrin migraine with no relief,

## 2021-08-05 ENCOUNTER — Other Ambulatory Visit: Payer: Self-pay

## 2021-08-05 ENCOUNTER — Emergency Department: Payer: Medicaid Other

## 2021-08-05 ENCOUNTER — Emergency Department
Admission: EM | Admit: 2021-08-05 | Discharge: 2021-08-05 | Disposition: A | Discharge status: Home / Self Care | Payer: Medicaid Other | Attending: Emergency Medicine | Admitting: Emergency Medicine

## 2021-08-05 DIAGNOSIS — E039 Hypothyroidism, unspecified: Secondary | ICD-10-CM | POA: Insufficient documentation

## 2021-08-05 DIAGNOSIS — Z79899 Other long term (current) drug therapy: Secondary | ICD-10-CM | POA: Insufficient documentation

## 2021-08-05 DIAGNOSIS — S63501A Unspecified sprain of right wrist, initial encounter: Secondary | ICD-10-CM | POA: Insufficient documentation

## 2021-08-05 DIAGNOSIS — Z7984 Long term (current) use of oral hypoglycemic drugs: Secondary | ICD-10-CM | POA: Insufficient documentation

## 2021-08-05 DIAGNOSIS — J45909 Unspecified asthma, uncomplicated: Secondary | ICD-10-CM | POA: Insufficient documentation

## 2021-08-05 DIAGNOSIS — E1122 Type 2 diabetes mellitus with diabetic chronic kidney disease: Secondary | ICD-10-CM | POA: Insufficient documentation

## 2021-08-05 DIAGNOSIS — W010XXA Fall on same level from slipping, tripping and stumbling without subsequent striking against object, initial encounter: Secondary | ICD-10-CM | POA: Insufficient documentation

## 2021-08-05 DIAGNOSIS — I129 Hypertensive chronic kidney disease with stage 1 through stage 4 chronic kidney disease, or unspecified chronic kidney disease: Secondary | ICD-10-CM | POA: Insufficient documentation

## 2021-08-05 DIAGNOSIS — N189 Chronic kidney disease, unspecified: Secondary | ICD-10-CM | POA: Insufficient documentation

## 2021-08-05 MED ORDER — NAPROXEN 500 MG PO TABS: 500.0000 mg | ORAL_TABLET | Freq: Two times a day (BID) | ORAL | 2 refills | Status: DC

## 2021-08-05 NOTE — ED Notes (Signed)
Triage done by Yaresly Menzel RN

## 2021-08-05 NOTE — ED Triage Notes (Signed)
Pt states she was carrying a heavy box and then dropped it. States tripped and now co right wrist pain. States happened a few days ago and is having pain.

## 2021-08-05 NOTE — ED Provider Notes (Signed)
[facility] Emergency Department Provider Note   ____________________________________________    I have reviewed the triage vital signs and the nursing notes.   HISTORY  Chief Complaint Wrist Pain     HPI Leah Bright is a 30 y.o. female with history as detailed below who presents with complaints of right wrist pain.  Patient reports that she tripped and fell several days ago and she thinks she injured her right wrist.  She reports it has been painful along the medial dorsal aspect.  has taken ibuprofen with some improvement.  No other injuries reported  Past Medical History:  Diagnosis Date   Agitation    Allergy    Anxiety    Asthma    Chronic kidney disease    Depression    Diabetes mellitus without complication (HCC)    High cholesterol    Hypertension    Hypothyroidism    Left renal artery stenosis (HCC) 09/07/2019   Migraine    OCD (obsessive compulsive disorder)    PTSD (post-traumatic stress disorder)    Renal artery stenosis (HCC)    Right renal atrophy    Social anxiety disorder    Social anxiety disorder    Social phobia    Thyroid disease     Patient Active Problem List   Diagnosis Date Noted   Generalized anxiety disorder 05/21/2020   Mood disorder (HCC) 01/19/2020   Elevated liver enzymes 01/19/2020   Hordeolum externum of left upper eyelid 10/13/2019   History of diabetes mellitus, type II 09/20/2019   Migraine without aura and without status migrainosus, not intractable 09/18/2019   Hypothyroidism 08/02/2018   Essential hypertension 08/02/2018   Morbid obesity (HCC) 08/02/2018   Type 2 diabetes mellitus without complication, without long-term current use of insulin (HCC) 08/02/2018   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/02/2018   Hypokalemia 09/27/2015   Hypomagnesemia 09/27/2015   Hypertension associated with diabetes (HCC) 09/27/2015   Abdominal pain  01/22/2013   Diarrhea 01/22/2013    Past Surgical History:  Procedure Laterality Date   CHOLECYSTECTOMY  2008   endoscopy/colonoscopy   2011   LAPAROSCOPIC GASTRIC BANDING  2007   LAPAROSCOPIC REPAIR AND REMOVAL OF GASTRIC BAND  2011   TONSILLECTOMY  1994   WISDOM TOOTH EXTRACTION      Prior to Admission medications   Medication Sig Start Date End Date Taking? Authorizing Provider  naproxen (NAPROSYN) 500 MG tablet Take 1 tablet (500 mg total) by mouth 2 (two) times daily with a meal. 08/05/21  Yes Jene Every, MD  albuterol (PROVENTIL) (5 MG/ML) 0.5% nebulizer solution Take 0.5 mLs (2.5 mg total) by nebulization every 6 (six) hours as needed for up to 5 days for wheezing or shortness of breath. 07/19/20 07/24/20  Shirlee Latch, PA-C  albuterol (VENTOLIN HFA) 108 (90 Base) MCG/ACT inhaler Inhale 2 puffs into the lungs every 6 (six) hours as needed for wheezing or shortness of breath. 07/19/20 08/18/20  Eusebio Friendly B, PA-C  amLODipine (NORVASC) 10 MG tablet Take 1 tablet (10 mg total) by mouth daily. 05/14/21   Chrismon, Jodell Cipro, PA-C  busPIRone (BUSPAR) 5 MG tablet Take 5 mg by mouth 2 (two) times daily.    [provider]  butalbital-acetaminophen-caffeine (FIORICET) 50-325-40 MG tablet Take 1 tablet by mouth every 6 (six) hours as needed for headache. 06/21/21   Jacky Kindle, FNP  fluticasone (FLONASE) 50 MCG/ACT nasal spray Place  2 sprays into both nostrils daily. 06/03/21   Menshew, Charlesetta Ivory, PA-C  fluticasone-salmeterol (ADVAIR) 250-50 MCG/ACT AEPB Inhale 1 puff into the lungs in the morning and at bedtime. 04/29/21   Chrismon, Jodell Cipro, PA-C  levothyroxine (SYNTHROID) 100 MCG tablet Take 1 tablet (100 mcg total) by mouth daily before breakfast. Please go to lab for lab test. 06/11/21   Jacky Kindle, FNP  metFORMIN (GLUCOPHAGE-XR) 500 MG 24 hr tablet TAKE 4 TABLETS BY MOUTH ONCE DAILY 05/14/21   Chrismon, Jodell Cipro, PA-C  ondansetron (ZOFRAN-ODT) 4 MG  disintegrating tablet Take 1 tablet (4 mg total) by mouth every 8 (eight) hours as needed for nausea or vomiting. 07/24/21   Concha Se, MD  traZODone (DESYREL) 100 MG tablet Take 150 mg by mouth at bedtime as needed for sleep.    [provider]  venlafaxine XR (EFFEXOR-XR) 75 MG 24 hr capsule Take 150 mg by mouth at bedtime.    [provider]     Allergies Toradol [ketorolac tromethamine], Codeine, Other, Pepto-bismol [bismuth], and Tramadol  Family History  Problem Relation Age of Onset   Cancer Mother        brain tumor   Hypertension Mother    Depression Mother    Diabetes Mother    Migraines Mother        benign tumor, still gets headaches sometimes now    Anxiety disorder Father    Depression Father    Hypertension Maternal Grandmother    Thyroid disease Maternal Grandmother    Diabetes Maternal Grandmother    Hypertension Maternal Grandfather    Cancer Maternal Grandfather        myositis   High blood pressure Maternal Grandfather    Diabetes Maternal Grandfather    Stroke Maternal Grandfather     Social History Social History   Tobacco Use   Smoking status: Never   Smokeless tobacco: Never  Vaping Use   Vaping Use: Never used  Substance Use Topics   Alcohol use: Yes    Comment: rarely, maybe 1 shot every 2-3 months   Drug use: Never    Review of Systems  Constitutional: No fever/chills  Musculoskeletal: As above Skin: Negative for abrasion or laceration     ____________________________________________   PHYSICAL EXAM:  VITAL SIGNS: ED Triage Vitals [08/05/21 1138]  Enc Vitals Group     BP (!) 143/85     Pulse Rate 81     Resp 20     Temp 98.2 F (36.8 C)     Temp Source Oral     SpO2 98 %     Weight 133.4 kg (294 lb)     Height 1.626 m (5\' 4" )     Head Circumference      Peak Flow      Pain Score 5     Pain Loc      Pain Edu?      Excl. in GC?      Constitutional: Alert and oriented. No acute distress.  Pleasant and interactive Eyes: Conjunctivae are normal.  Head: Atraumatic. Nose: No congestion/rhinnorhea. Mouth/Throat: Mucous membranes are moist.   Cardiovascular: Normal rate, regular rhythm.  Respiratory: Normal respiratory effort.  No retractions. Genitourinary: deferred Musculoskeletal: No significant swelling, painful range of motion of the right wrist, some tenderness along the medial dorsal aspect, no bony modalities palpated abnormalities palpated Neurologic:  Normal speech and language. No gross focal neurologic deficits are appreciated.   Skin:  Skin is  warm, dry and intact. No rash noted.   ____________________________________________   LABS (all labs ordered are listed, but only abnormal results are displayed)  Labs Reviewed - No data to display ____________________________________________  EKG   ____________________________________________  RADIOLOGY  X-ray reviewed by me, no fracture ____________________________________________   PROCEDURES  Procedure(s) performed: No  Procedures   Critical Care performed: No ____________________________________________   INITIAL IMPRESSION / ASSESSMENT AND PLAN / ED COURSE  Pertinent labs & imaging results that were available during my care of the patient were reviewed by me and considered in my medical decision making (see chart for details).   Patient presents with wrist pain as detailed above, exam is most consistent with sprain, x-ray confirms no fracture, premade wrist splint applied, outpatient follow-up with Ortho if no improvement over the next week.   ____________________________________________   FINAL CLINICAL IMPRESSION(S) / ED DIAGNOSES  Final diagnoses:  Sprain of right wrist, initial encounter      NEW MEDICATIONS STARTED DURING THIS VISIT:  Discharge Medication List as of 08/05/2021  2:01 PM     START taking these medications   Details  naproxen (NAPROSYN) 500 MG tablet Take 1 tablet  (500 mg total) by mouth 2 (two) times daily with a meal., Starting Mon 08/05/2021, Normal         Note:  This document was prepared using Dragon voice recognition software and may include unintentional dictation errors.    Jene Every, MD 08/05/21 [phone]

## 2021-08-07 ENCOUNTER — Ambulatory Visit: Payer: Self-pay | Admitting: *Deleted

## 2021-08-07 NOTE — Telephone Encounter (Signed)
Reason for Disposition  [1] Numbness (i.e., loss of sensation) in hand or fingers AND [2] new-onset    Right thumb numb.   Has wrist injury in a brace.   Was seen in the ED  Answer Assessment - Initial Assessment Questions 1. ONSET: "When did the pain start?"      Pt calling in.  I was carrying in a box of cans and tripped.  I dropped box and I fell.   I caught myself with my right hand/wrist.   I did break the top of my foot.   I have a boot on it.   My wrist is hurting.   An x ray was done at the hospital.   I fell 2 weeks ago.    I went Monday to ER.    I was trying to self care.  My son closed a car door on my injured wrist and I hit the wrist on a cabinet.   It's not broken but I could have a ligament torn or something.   I need an MRI and a dr has to order that.   I have a brace on my wrist from the hospital.   Yesterday my fingers started feeling weird.   Last night I removed the brace and did not sleep in it. My thumb is numb.  The ER dr told me to see an orthopedic dr if I got numbness in my fingers.   I can't feel the top of my right thumb around the thumbnail.   It's numb and tingling.   My other fingers are fine. I don't know what to do. 2. LOCATION and RADIATION: "Where is the pain located?"  (e.g., fingertip, around nail, joint, entire  finger)      The numbness is on tip of my right thumb around the nail.   Numb a day and a half. 3. SEVERITY: "How bad is the pain?" "What does it keep you from doing?"   (Scale 1-10; or mild, moderate, severe)  - MILD (1-3): doesn't interfere with normal activities.   - MODERATE (4-7): interferes with normal activities or awakens from sleep.  - SEVERE (8-10): excruciating pain, unable to hold a glass of water or bend finger even a little.     No pain 4. APPEARANCE: "What does the finger look like?" (e.g., redness, swelling, bruising, pallor)     Right thumb is not swollen or discolored.  It's just numb 5. WORK OR EXERCISE: "Has there been any recent  work or exercise that involved this part (i.e., fingers or hand) of the body?"     See above 6. CAUSE: "What do you think is causing the pain?"     I don't know.   It's numb without the brace too and with it.  The wrist brace. 7. AGGRAVATING FACTORS: "What makes the pain worse?" (e.g., using computer)     NA 8. OTHER SYMPTOMS: "Do you have any other symptoms?" (e.g., fever, neck pain, numbness)     Tingling and numbness in right thumb.    9. PREGNANCY: "Is there any chance you are pregnant?" "When was your last menstrual period?"     Not asked  Protocols used: Finger Pain-A-AH

## 2021-08-07 NOTE — Telephone Encounter (Signed)
Pt called in c/o her right thumb being numb since yesterday evening.   She has a wrist injury and is in a wrist brace.   She took it off and did not sleep with it on last night thinking it was the brace but her right thumb continues to be numb on the tip around the nail.  She was seen in the ED for the wrist injury and told to follow up with an orthopedic dr for an MRI and if she had any further problems.   Due to financial issues instead of me scheduling her at [facility] she wants to go on to the [facility] walk in clinic first.   She's going to see if someone in her church can help her with the finances.   I instructed her to call us back if the ortho dr arrangement did not work out because she needs to have her thumb evaluated for the numbness.   She verbalized understanding.   See triage notes.

## 2021-08-14 ENCOUNTER — Ambulatory Visit: Payer: Self-pay | Admitting: *Deleted

## 2021-08-14 NOTE — Telephone Encounter (Signed)
Spoke with patient on the phone and reports that her wrist is tender and painful, she states that she can feel a knot and believes she has a possible strain. Advised patient ov would be needed to evaluate and asses. Scheduled patient appt with Lillia Abed for 08/15/21. KW

## 2021-08-14 NOTE — Telephone Encounter (Signed)
Patient called in to say that she have a bump or knot on the inside of her arm that she sprained a few weeks ago and been wearing a brace. She is not sure what happen but say that it is painful. Please call Ph#  [phone]    Chief Complaint: Small knot inner wrist Symptoms: tender to touch Frequency: Noted this AM Pertinent Negatives: Patient denies redness, warmth, no fever Disposition: [] ED /[] Urgent Care (no appt availability in office) / [] Appointment(In office/virtual)/ []  [place] [facility]/ [x] Home Care/ [] Refused Recommended Disposition  Additional Notes: Reports small lump under skin after wearing brace for sprain. Smaller than a pea. No redness or warmth. Home care advise given. Assured pt NT would route to practice for PCPs review. Pt verbalizes understanding.         Reason for Disposition  [1] Small swelling or lump AND [2] unexplained AND [3] present < 1 week  Answer Assessment - Initial Assessment Questions 1. APPEARANCE of SWELLING: "What does it look like?" (e.g., lymph node, insect bite, mole)     Under skin 2. SIZE: "How large is the swelling?" (e.g., inches, cm; or compare to size of pinhead, tip of pen, eraser, coin, pea, grape, ping pong ball)      Smaller than a pea 3. LOCATION: "Where is the swelling located?"     Inner right wrist 4. ONSET: "When did the swelling start?"     This AM 5. PAIN: "Is it painful?" If Yes, ask: "How much?"     Tender to touch, 7-8/10 with movement 6. ITCH: "Does it itch?" If Yes, ask: "How much?"     no 7. CAUSE: "What do you think caused the swelling?"     Unsure 8. OTHER SYMPTOMS: "Do you have any other symptoms?" (e.g., fever)     no  Protocols used: Skin Lump or Localized Swelling-A-AH

## 2021-08-14 NOTE — Progress Notes (Signed)
Established patient visit   Patient: Leah Bright   DOB: 1991/07/19   30 y.o. Female  MRN: [id number] Visit Date: 08/15/2021  Today's healthcare provider: Alfredia Ferguson, PA-C   Cc. Right wrist pain x 2-3 weeks  Subjective    HPI   Leah Bright is a 30 y/o female who presents today with concerns of right wrist pain and tenderness for the last 3 weeks.  She states that week for Thanksgiving she tripped over some cords and dropped a box on her foot which caused her to lose her balance and she landed on her right wrist.  After this event she began experiencing pain with range of motion and tenderness to the top of her wrist.  She states that there was some bruising along the top of her hand and underside of her wrist.  She was evaluated in the ED where they gave her a soft brace to wear and advised her wrist was sprained.  Wrist x-ray series were completed, no abnormalities.  She has been taking Advil occasionally, was given naproxen but is hesitant to take this medication.  She also reports that for the last few months her right thumb has been numb which doctors have told her is likely secondary to carpal tunnel.  She has never been officially evaluated for carpal tunnel.  Denies any other numbness or tingling in the right hand.  Medications: Outpatient Medications Prior to Visit  Medication Sig   amLODipine (NORVASC) 10 MG tablet Take 1 tablet (10 mg total) by mouth daily.   busPIRone (BUSPAR) 5 MG tablet Take 5 mg by mouth 2 (two) times daily.   butalbital-acetaminophen-caffeine (FIORICET) 50-325-40 MG tablet Take 1 tablet by mouth every 6 (six) hours as needed for headache.   fluticasone (FLONASE) 50 MCG/ACT nasal spray Place 2 sprays into both nostrils daily.   fluticasone-salmeterol (ADVAIR) 250-50 MCG/ACT AEPB Inhale 1 puff into the lungs in the morning and at bedtime.   levothyroxine (SYNTHROID) 100 MCG tablet Take 1 tablet (100 mcg total) by mouth daily before breakfast. Please go  to lab for lab test.   metFORMIN (GLUCOPHAGE-XR) 500 MG 24 hr tablet TAKE 4 TABLETS BY MOUTH ONCE DAILY   naproxen (NAPROSYN) 500 MG tablet Take 1 tablet (500 mg total) by mouth 2 (two) times daily with a meal.   ondansetron (ZOFRAN-ODT) 4 MG disintegrating tablet Take 1 tablet (4 mg total) by mouth every 8 (eight) hours as needed for nausea or vomiting.   traZODone (DESYREL) 100 MG tablet Take 150 mg by mouth at bedtime as needed for sleep.   venlafaxine XR (EFFEXOR-XR) 75 MG 24 hr capsule Take 150 mg by mouth at bedtime.   albuterol (PROVENTIL) (5 MG/ML) 0.5% nebulizer solution Take 0.5 mLs (2.5 mg total) by nebulization every 6 (six) hours as needed for up to 5 days for wheezing or shortness of breath.   albuterol (VENTOLIN HFA) 108 (90 Base) MCG/ACT inhaler Inhale 2 puffs into the lungs every 6 (six) hours as needed for wheezing or shortness of breath.   No facility-administered medications prior to visit.    Review of Systems  Musculoskeletal:  Positive for arthralgias and joint swelling.   Last CBC Lab Results  Component Value Date   WBC 8.1 07/24/2021   HGB 14.0 07/24/2021   HCT 40.9 07/24/2021   MCV 78.1 (L) 07/24/2021   MCH 26.7 07/24/2021   RDW 13.2 07/24/2021   PLT 304 07/24/2021   Last metabolic panel Lab Results  Component Value Date   GLUCOSE 286 (H) 07/24/2021   NA 133 (L) 07/24/2021   K 4.2 07/24/2021   CL 102 07/24/2021   CO2 23 07/24/2021   BUN 8 07/24/2021   CREATININE 0.67 07/24/2021   GFRNONAA >60 07/24/2021   CALCIUM 9.0 07/24/2021   PHOS 3.0 07/19/2019   PROT 7.7 07/24/2021   ALBUMIN 4.1 07/24/2021   LABGLOB 2.3 05/09/2021   AGRATIO 1.7 05/09/2021   BILITOT 0.7 07/24/2021   ALKPHOS 81 07/24/2021   AST 38 07/24/2021   ALT 45 (H) 07/24/2021   ANIONGAP 8 07/24/2021   Last lipids Lab Results  Component Value Date   CHOL 205 (H) 05/09/2021   HDL 49 05/09/2021   LDLCALC 129 (H) 05/09/2021   TRIG 149 05/09/2021   CHOLHDL 4.0 07/19/2019   Last  hemoglobin A1c Lab Results  Component Value Date   HGBA1C 10.3 (H) 05/09/2021   Last thyroid functions Lab Results  Component Value Date   TSH 8.700 (H) 05/09/2021   T4TOTAL 7.6 07/19/2019   Last vitamin D No results found for: 25OHVITD2, 25OHVITD3, VD25OH Last vitamin B12 and Folate No results found for: VITAMINB12, FOLATE     Objective    BP 128/86 (BP Location: Left Arm, Patient Position: Sitting, Cuff Size: Large)    Pulse 78    Ht 5\' 5"  (1.651 m)    Wt 295 lb 14.4 oz (134.2 kg)    LMP 07/25/2021    SpO2 96%    BMI 49.24 kg/m  BP Readings from Last 3 Encounters:  08/15/21 128/86  08/05/21 (!) 143/85  07/24/21 128/67   Wt Readings from Last 3 Encounters:  08/15/21 295 lb 14.4 oz (134.2 kg)  08/05/21 294 lb (133.4 kg)  07/24/21 294 lb (133.4 kg)   Physical Exam Constitutional:      Appearance: She is not ill-appearing.  HENT:     Head: Normocephalic.  Eyes:     Conjunctiva/sclera: Conjunctivae normal.  Cardiovascular:     Rate and Rhythm: Normal rate.  Pulmonary:     Effort: Pulmonary effort is normal. No respiratory distress.  Musculoskeletal:     Right wrist: Swelling, tenderness and bony tenderness present. No snuff box tenderness. Decreased range of motion.       Arms:     Comments: Right wrist acutely tender to flexion, patient able to extend without significant pain.  Patient is tender to to the ulnar styloid process.  There is a significant edema along this area as well.  Neurological:     General: No focal deficit present.     Mental Status: She is alert and oriented to person, place, and time.  Psychiatric:        Mood and Affect: Mood normal.        Behavior: Behavior normal.     No results found for any visits on 08/15/21.  Assessment & Plan     1.  Right wrist sprain  The current brace she is wearing is very large and cumbersome, we discussed a cock-up wrist splint instead.  She says she will look in [place] for options.  I advised her to take  the naproxen, reassured it is an anti-inflammatory medication not a narcotic.  Also advised her to ice this area.  Refer to orthopedic/orthopedic hand surgery for further evaluation as this pain is not improved over the last 2-3 weeks.  Return if symptoms worsen or fail to improve.      I, [place], PA-C have  reviewed all documentation for this visit. The documentation on  08/15/2021  for the exam, diagnosis, procedures, and orders are all accurate and complete.    Alfredia Ferguson, PA-C  [facility] [phone] (phone) [phone] (fax)  [facility]

## 2021-08-15 ENCOUNTER — Ambulatory Visit (INDEPENDENT_AMBULATORY_CARE_PROVIDER_SITE_OTHER): Payer: Self-pay | Admitting: Physician Assistant

## 2021-08-15 ENCOUNTER — Other Ambulatory Visit: Payer: Self-pay

## 2021-08-15 ENCOUNTER — Encounter: Payer: Self-pay | Admitting: Physician Assistant

## 2021-08-15 VITALS — BP 128/86 | HR 78 | Ht 65.0 in | Wt 295.9 lb

## 2021-08-15 DIAGNOSIS — S63501A Unspecified sprain of right wrist, initial encounter: Secondary | ICD-10-CM

## 2021-08-15 DIAGNOSIS — M25531 Pain in right wrist: Secondary | ICD-10-CM

## 2021-09-21 ENCOUNTER — Other Ambulatory Visit: Payer: Self-pay | Admitting: Family Medicine

## 2021-09-21 DIAGNOSIS — E039 Hypothyroidism, unspecified: Secondary | ICD-10-CM

## 2021-09-21 NOTE — Telephone Encounter (Signed)
Rf due  Last RF 06/11/21 #90  Needs lab work repeat TSH.  Sent pt message to call and make appt.   Requested Prescriptions  Pending Prescriptions Disp Refills   levothyroxine (SYNTHROID) 100 MCG tablet [Pharmacy Med Name: Levothyroxine Sodium 100 MCG Oral Tablet] 90 tablet 0    Sig: TAKE 1 TABLET BY MOUTH DAILY BEFORE BREAKFAST (PLEASE GO TO LAB FOR LAB TEST)     Endocrinology:  Hypothyroid Agents Failed - 09/21/2021  4:16 PM      Failed - TSH needs to be rechecked within 3 months after an abnormal result. Refill until TSH is due.      Failed - TSH in normal range and within 360 days    TSH  Date Value Ref Range Status  05/09/2021 8.700 (H) 0.450 - 4.500 uIU/mL Final          Passed - Valid encounter within last 12 months    Recent Outpatient Visits           1 month ago Right wrist pain   [place] Mikey Kirschner, PA-C   3 months ago Type 2 diabetes mellitus without complication, without long-term current use of insulin [facility])   [facility] Gwyneth Sprout, FNP   4 months ago Subacute pansinusitis   [employer], Vickki Muff, PA-C   1 year ago [place], Wendee Beavers, PA-C   1 year ago Suspected COVID-19 virus infection   [place], Kelby Aline, [place]

## 2021-09-23 ENCOUNTER — Telehealth: Payer: Self-pay | Admitting: Family Medicine

## 2021-09-23 DIAGNOSIS — E119 Type 2 diabetes mellitus without complications: Secondary | ICD-10-CM

## 2021-09-23 MED ORDER — METFORMIN HCL ER 500 MG PO TB24: ORAL_TABLET | ORAL | 0 refills | Status: DC

## 2021-09-23 NOTE — Telephone Encounter (Signed)
[facility] faxed refill request for the following medications:  metFORMIN (GLUCOPHAGE-XR) 500 MG 24 hr tablet   Please advise.

## 2021-10-07 ENCOUNTER — Ambulatory Visit: Payer: Self-pay | Admitting: *Deleted

## 2021-10-07 NOTE — Telephone Encounter (Signed)
°  Chief Complaint: cough , request appt  Symptoms: cough, taste like blood but sputum clear, ear pain, shortness of breath after coughing  Frequency: x 1 week  Pertinent Negatives: Patient denies chest pain now , difficulty breathing or fever  Disposition: [] ED /[x] Urgent Care (no appt availability in office) / [] Appointment(In office/virtual)/ [x]   Virtual Care/ [] Home Care/ [] Refused Recommended Disposition /[]  Mobile Bus/ []  Follow-up with PCP Additional Notes:   Hx asthma, recommended My chart VV or UC      Reason for Disposition  [1] MILD difficulty breathing (e.g., minimal/no SOB at rest, SOB with walking, pulse <100) AND [2] still present when not coughing  Answer Assessment - Initial Assessment Questions 1. ONSET: "When did the cough begin?"      Last week  2. SEVERITY: "How bad is the cough today?"      Yes  3. SPUTUM: "Describe the color of your sputum" (none, dry cough; clear, white, yellow, green)     Clear  4. HEMOPTYSIS: "Are you coughing up any blood?" If so ask: "How much?" (flecks, streaks, tablespoons, etc.)     No taste like blood  5. DIFFICULTY BREATHING: "Are you having difficulty breathing?" If Yes, ask: "How bad is it?" (e.g., mild, moderate, severe)    - MILD: No SOB at rest, mild SOB with walking, speaks normally in sentences, can lie down, no retractions, pulse < 100.    - MODERATE: SOB at rest, SOB with minimal exertion and prefers to sit, cannot lie down flat, speaks in phrases, mild retractions, audible wheezing, pulse 100-120.    - SEVERE: Very SOB at rest, speaks in single words, struggling to breathe, sitting hunched forward, retractions, pulse > 120      Shortness of breath after coughing, wheezing at times  6. FEVER: "Do you have a fever?" If Yes, ask: "What is your temperature, how was it measured, and when did it start?"     none now  7. CARDIAC HISTORY: "Do you have any history of heart disease?" (e.g., heart attack, congestive  heart failure)      Na  8. LUNG HISTORY: "Do you have any history of lung disease?"  (e.g., pulmonary embolus, asthma, emphysema)     Asthma  9. PE RISK FACTORS: "Do you have a history of blood clots?" (or: recent major surgery, recent prolonged travel, bedridden)     na 10. OTHER SYMPTOMS: "Do you have any other symptoms?" (e.g., runny nose, wheezing, chest pain)       Chest pain after coughing  11. PREGNANCY: "Is there any chance you are pregnant?" "When was your last menstrual period?"       na 12. TRAVEL: "Have you traveled out of the country in the last month?" (e.g., travel history, exposures)       na  Protocols used: Cough - Acute Productive-A-AH

## 2021-10-23 ENCOUNTER — Ambulatory Visit: Payer: Medicaid Other | Admitting: Obstetrics and Gynecology

## 2021-10-28 ENCOUNTER — Ambulatory Visit: Payer: Medicaid Other | Admitting: Obstetrics and Gynecology

## 2021-11-21 ENCOUNTER — Ambulatory Visit: Payer: Medicaid Other | Admitting: Obstetrics and Gynecology

## 2021-12-11 ENCOUNTER — Ambulatory Visit: Payer: Medicaid Other | Admitting: Obstetrics and Gynecology

## 2021-12-23 ENCOUNTER — Ambulatory Visit: Payer: Medicaid Other | Admitting: Obstetrics

## 2022-01-15 ENCOUNTER — Other Ambulatory Visit: Payer: Self-pay | Admitting: Family Medicine

## 2022-01-15 DIAGNOSIS — E039 Hypothyroidism, unspecified: Secondary | ICD-10-CM

## 2022-01-15 NOTE — Telephone Encounter (Signed)
Requested medications are due for refill today.  yes ? ?Requested medications are on the active medications list.  yes ? ?Last refill. 1/23/20203 330 0 refills ? ?Future visit scheduled.   no ? ?Notes to clinic.  Medication refill failed protocol d/t abnormal labs. ? ? ? ?Requested Prescriptions  ?Pending Prescriptions Disp Refills  ? EUTHYROX 100 MCG tablet [Pharmacy Med Name: Euthyrox 100 MCG Oral Tablet] 30 tablet 0  ?  Sig: TAKE 1 TABLET BY MOUTH ONCE DAILY BEFORE BREAKFAST PLEASE  GO  TO  LAB  FOR  LAB  TEST  ?  ? Endocrinology:  Hypothyroid Agents Failed - 01/15/2022 10:12 AM  ?  ?  Failed - TSH in normal range and within 360 days  ?  TSH  ?Date Value Ref Range Status  ?05/09/2021 8.700 (H) 0.450 - 4.500 uIU/mL Final  ?   ?  ?  Passed - Valid encounter within last 12 months  ?  Recent Outpatient Visits   ? ?      ? 5 months ago Right wrist pain  ? [facility] Thedore Mins, Ria Comment, PA-C  ? 6 months ago Type 2 diabetes mellitus without complication, without long-term current use of insulin ([place])  ? [facility] Gwyneth Sprout, FNP  ? 8 months ago Subacute pansinusitis  ? [place], PA-C  ? 1 year ago COVID-19  ? [place], [place]  ? 1 year ago Suspected COVID-19 virus infection  ? [place], FNP  ? ?  ?  ? ? ?  ?  ?  ?  ?

## 2022-01-17 LAB — HM DIABETES EYE EXAM

## 2022-02-19 ENCOUNTER — Telehealth: Payer: Self-pay | Admitting: Family Medicine

## 2022-02-19 NOTE — Telephone Encounter (Signed)
[facility] faxed refill request for the following medications: ° °levothyroxine (EUTHYROX) 100 MCG tablet  ° °Please advise. ° °

## 2022-02-20 NOTE — Progress Notes (Unsigned)
      Established patient visit   Patient: Leah Bright   DOB: 1991-07-01   31 y.o. Female  MRN: [id number] Visit Date: 02/24/2022  Today's healthcare provider: Jacky Kindle, FNP   No chief complaint on file.  Subjective    HPI  Hypothyroid, follow-up  Lab Results  Component Value Date   TSH 8.700 (H) 05/09/2021   TSH 6.330 (H) 03/19/2020   TSH 10.500 (H) 07/19/2019   T4TOTAL 7.6 07/19/2019    Wt Readings from Last 3 Encounters:  08/15/21 295 lb 14.4 oz (134.2 kg)  08/05/21 294 lb (133.4 kg)  07/24/21 294 lb (133.4 kg)    She was last seen for hypothyroid 10 months ago.  Management since that visit includes continue medication. She reports {excellent/good/fair/poor:19665} compliance with treatment. She {is/is not:21021397} having side effects. {document side effects if present:1}  Symptoms: {Yes/No:20286} change in energy level {Yes/No:20286} constipation  {Yes/No:20286} diarrhea {Yes/No:20286} heat / cold intolerance  {Yes/No:20286} nervousness {Yes/No:20286} palpitations  {Yes/No:20286} weight changes    -----------------------------------------------------------------------------------------   Medications: Outpatient Medications Prior to Visit  Medication Sig   albuterol (PROVENTIL) (5 MG/ML) 0.5% nebulizer solution Take 0.5 mLs (2.5 mg total) by nebulization every 6 (six) hours as needed for up to 5 days for wheezing or shortness of breath.   albuterol (VENTOLIN HFA) 108 (90 Base) MCG/ACT inhaler Inhale 2 puffs into the lungs every 6 (six) hours as needed for wheezing or shortness of breath.   amLODipine (NORVASC) 10 MG tablet Take 1 tablet (10 mg total) by mouth daily.   busPIRone (BUSPAR) 5 MG tablet Take 5 mg by mouth 2 (two) times daily.   butalbital-acetaminophen-caffeine (FIORICET) 50-325-40 MG tablet Take 1 tablet by mouth every 6 (six) hours as needed for headache.   fluticasone (FLONASE) 50 MCG/ACT nasal spray Place 2 sprays into both nostrils daily.    fluticasone-salmeterol (ADVAIR) 250-50 MCG/ACT AEPB Inhale 1 puff into the lungs in the morning and at bedtime.   levothyroxine (EUTHYROX) 100 MCG tablet Take 1 tablet (100 mcg total) by mouth daily before breakfast. Labs are needed to ensure this dose is still correct; please come to our lab or go to the nearest [facility] for TSH/Free T4 within the next month.   metFORMIN (GLUCOPHAGE-XR) 500 MG 24 hr tablet TAKE 4 TABLETS BY MOUTH ONCE DAILY   naproxen (NAPROSYN) 500 MG tablet Take 1 tablet (500 mg total) by mouth 2 (two) times daily with a meal.   ondansetron (ZOFRAN-ODT) 4 MG disintegrating tablet Take 1 tablet (4 mg total) by mouth every 8 (eight) hours as needed for nausea or vomiting.   traZODone (DESYREL) 100 MG tablet Take 150 mg by mouth at bedtime as needed for sleep.   venlafaxine XR (EFFEXOR-XR) 75 MG 24 hr capsule Take 150 mg by mouth at bedtime.   No facility-administered medications prior to visit.    Review of Systems  {Labs  Heme  Chem  Endocrine  Serology  Results Review (optional):23779}   Objective    There were no vitals taken for this visit. {Show previous vital signs (optional):23777}  Physical Exam  ***  No results found for any visits on 02/24/22.  Assessment & Plan     ***  No follow-ups on file.      {provider attestation***:1}   Jacky Kindle, FNP  [facility] [phone] (phone) [phone] (fax)  [facility]

## 2022-02-24 ENCOUNTER — Ambulatory Visit (INDEPENDENT_AMBULATORY_CARE_PROVIDER_SITE_OTHER): Payer: Managed Care, Other (non HMO) | Admitting: Family Medicine

## 2022-02-24 ENCOUNTER — Encounter: Payer: Self-pay | Admitting: Family Medicine

## 2022-02-24 DIAGNOSIS — I152 Hypertension secondary to endocrine disorders: Secondary | ICD-10-CM

## 2022-02-24 DIAGNOSIS — Z596 Low income: Secondary | ICD-10-CM

## 2022-02-24 DIAGNOSIS — F331 Major depressive disorder, recurrent, moderate: Secondary | ICD-10-CM

## 2022-02-24 DIAGNOSIS — Z114 Encounter for screening for human immunodeficiency virus [HIV]: Secondary | ICD-10-CM

## 2022-02-24 DIAGNOSIS — E1159 Type 2 diabetes mellitus with other circulatory complications: Secondary | ICD-10-CM

## 2022-02-24 DIAGNOSIS — F39 Unspecified mood [affective] disorder: Secondary | ICD-10-CM

## 2022-02-24 DIAGNOSIS — Z599 Problem related to housing and economic circumstances, unspecified: Secondary | ICD-10-CM

## 2022-02-24 DIAGNOSIS — E039 Hypothyroidism, unspecified: Secondary | ICD-10-CM | POA: Diagnosis not present

## 2022-02-24 DIAGNOSIS — E1165 Type 2 diabetes mellitus with hyperglycemia: Secondary | ICD-10-CM

## 2022-02-24 DIAGNOSIS — Z5941 Food insecurity: Secondary | ICD-10-CM

## 2022-02-24 HISTORY — DX: Encounter for screening for human immunodeficiency virus (HIV): Z11.4

## 2022-02-24 LAB — POCT GLYCOSYLATED HEMOGLOBIN (HGB A1C)
Est. average glucose Bld gHb Est-mCnc: 318
Hemoglobin A1C: 12.7 % — AB (ref 4.0–5.6)

## 2022-02-24 MED ORDER — AMLODIPINE BESYLATE 10 MG PO TABS: 10.0000 mg | ORAL_TABLET | Freq: Every day | ORAL | 1 refills | Status: DC

## 2022-02-24 NOTE — Assessment & Plan Note (Signed)
Chronic, worsening Multiple points of stressors in her life Notes that her dogs keep her going; however, is controlled financially by her husband, as he is employed Theme park manager by [employer]

## 2022-02-24 NOTE — Assessment & Plan Note (Signed)
Chronic, borderline Continue to recommend purposeful weight loss of 5-10%, goal of DASH diet and continue to use Norvasc 10 mg to assist Goal of <130/<80 recommended for co-existing DM

## 2022-02-24 NOTE — Assessment & Plan Note (Signed)
Chronic, worsening control Patient notes not being able to afford to eat, let alone take her medication Has experienced acute stressors with the children in her life including suicidal attempts, failed, and injury, both mental and physical Continue to recommend use of once daily insulin to assist metformin and diet/exercise plan At this time, patient does not want to make any changes that will change her current drug costs, and her spouse handles all financial aspects Continue to recommend balanced, lower carb meals. Smaller meal size, adding snacks. Choosing water as drink of choice and increasing purposeful exercise.

## 2022-02-25 ENCOUNTER — Encounter: Payer: Self-pay | Admitting: Family Medicine

## 2022-02-25 ENCOUNTER — Telehealth: Payer: Self-pay | Admitting: *Deleted

## 2022-02-25 ENCOUNTER — Other Ambulatory Visit: Payer: Self-pay | Admitting: Family Medicine

## 2022-02-25 DIAGNOSIS — E1165 Type 2 diabetes mellitus with hyperglycemia: Secondary | ICD-10-CM

## 2022-02-25 DIAGNOSIS — E119 Type 2 diabetes mellitus without complications: Secondary | ICD-10-CM

## 2022-02-25 DIAGNOSIS — E039 Hypothyroidism, unspecified: Secondary | ICD-10-CM

## 2022-02-25 LAB — TSH+FREE T4
Free T4: 1.39 ng/dL (ref 0.82–1.77)
TSH: 6.99 u[IU]/mL — ABNORMAL HIGH (ref 0.450–4.500)

## 2022-02-25 LAB — HIV ANTIBODY (ROUTINE TESTING W REFLEX)

## 2022-02-25 LAB — HEMOGLOBIN A1C
Est. average glucose Bld gHb Est-mCnc: 309 mg/dL
Hgb A1c MFr Bld: 12.4 % — ABNORMAL HIGH (ref 4.8–5.6)

## 2022-02-25 MED ORDER — LEVOTHYROXINE SODIUM 150 MCG PO TABS
150.0000 ug | ORAL_TABLET | Freq: Every day | ORAL | 0 refills | Status: DC
Start: 2022-02-25 — End: 2022-05-19

## 2022-02-25 MED ORDER — GLIPIZIDE 10 MG PO TABS: 10.0000 mg | ORAL_TABLET | Freq: Two times a day (BID) | ORAL | 1 refills | Status: DC

## 2022-02-25 MED ORDER — METFORMIN HCL ER 500 MG PO TB24: 1000.0000 mg | ORAL_TABLET | Freq: Two times a day (BID) | ORAL | 1 refills | Status: DC

## 2022-02-25 MED ORDER — PIOGLITAZONE HCL 45 MG PO TABS: 45.0000 mg | ORAL_TABLET | Freq: Every day | ORAL | 1 refills | Status: DC

## 2022-02-25 NOTE — Telephone Encounter (Signed)
   Telephone encounter was:  Successful.  02/25/2022 Name: Leah Bright MRN: [id number] DOB: 03/20/1991  Leah Bright is a 31 y.o. year old female who is a primary care patient of Jacky Kindle, FNP . The community resource team was consulted for assistance with Food Insecurity  Care guide performed the following interventions: Patient provided with information about care guide support team and interviewed to confirm resource needs Discussed resources to assist with food .  Provide food finder app and additional food banks to patient  Follow Up Plan:  No further follow up planned at this time. The patient has been provided with needed resources. Alois Cliche -[facility] Guide , Embedded Care Coordination [facility], Care Management  [phone] [address] [place] , [place] [address] Email : [email or web address]

## 2022-02-27 ENCOUNTER — Ambulatory Visit: Payer: Managed Care, Other (non HMO) | Admitting: *Deleted

## 2022-02-27 DIAGNOSIS — E119 Type 2 diabetes mellitus without complications: Secondary | ICD-10-CM

## 2022-02-27 DIAGNOSIS — E1159 Type 2 diabetes mellitus with other circulatory complications: Secondary | ICD-10-CM

## 2022-02-27 NOTE — Chronic Care Management (AMB) (Signed)
Assessment: Assessed patient's previous and current treatment, coping skills, support system and barriers to care.. No Care Plan was developed during this encounter.  Recent life changes Efrain Sella: patient discussed financial challenges in general  and is not able to pick up her medications that are at the pharmacy. Per patient, she resides with her spouse and they are living on one income. Patient has been denied disability and does not qualify for food stamps. The careguide has provided patient with resources for food banks.  Per patient, her primary concern is obtaining her medications. Resources provided for Emergency Assistance, Holiday representative, [employer], Department of [facility], and as well as [place] [address] for additional resources for financial assistance. Interventions:Solution-Focused Strategies employed:  Active listening / Reflection utilized  Emotional Support Provided  Review various resources, discussed options and provided patient information about  Referral to pharmacy for ,medication assistance  Recommendation: Patient may benefit from referral for medication assistance  Follow up Plan: No follow up scheduled with CCM LCSW at this time. Patient will call office if needed.

## 2022-05-17 ENCOUNTER — Other Ambulatory Visit: Payer: Self-pay | Admitting: Family Medicine

## 2022-05-17 DIAGNOSIS — E039 Hypothyroidism, unspecified: Secondary | ICD-10-CM

## 2022-05-19 NOTE — Telephone Encounter (Signed)
Requested Prescriptions  Pending Prescriptions Disp Refills  . levothyroxine (SYNTHROID) 150 MCG tablet [Pharmacy Med Name: Levothyroxine Sodium 150 MCG Oral Tablet] 90 tablet 0    Sig: Take 1 tablet by mouth once daily     Endocrinology:  Hypothyroid Agents Failed - 05/17/2022  1:19 PM      Failed - TSH in normal range and within 360 days    TSH  Date Value Ref Range Status  02/24/2022 6.990 (H) 0.450 - 4.500 uIU/mL Final         Passed - Valid encounter within last 12 months    Recent Outpatient Visits          2 months ago Morbid obesity [facility])   [facility] Tally Joe T, FNP   9 months ago Right wrist pain   [facility] Mikey Kirschner, PA-C   11 months ago Type 2 diabetes mellitus without complication, without long-term current use of insulin [facility])   [facility] Gwyneth Sprout, FNP   1 year ago Subacute pansinusitis   [place], Vickki Muff, PA-C   1 year ago [facility], [place], [place]

## 2022-05-27 ENCOUNTER — Ambulatory Visit: Payer: Managed Care, Other (non HMO) | Admitting: Family Medicine

## 2022-06-14 ENCOUNTER — Other Ambulatory Visit: Payer: Self-pay | Admitting: Family Medicine

## 2022-06-14 DIAGNOSIS — E039 Hypothyroidism, unspecified: Secondary | ICD-10-CM

## 2022-06-30 ENCOUNTER — Encounter (INDEPENDENT_AMBULATORY_CARE_PROVIDER_SITE_OTHER): Payer: Self-pay

## 2022-10-20 ENCOUNTER — Ambulatory Visit (INDEPENDENT_AMBULATORY_CARE_PROVIDER_SITE_OTHER): Payer: Managed Care, Other (non HMO) | Admitting: Physician Assistant

## 2022-10-20 ENCOUNTER — Ambulatory Visit: Payer: Self-pay | Admitting: *Deleted

## 2022-10-20 ENCOUNTER — Encounter: Payer: Self-pay | Admitting: Physician Assistant

## 2022-10-20 ENCOUNTER — Telehealth: Payer: Self-pay | Admitting: Family Medicine

## 2022-10-20 VITALS — BP 148/98 | HR 85 | Temp 98.8°F | Wt 310.8 lb

## 2022-10-20 DIAGNOSIS — H9201 Otalgia, right ear: Secondary | ICD-10-CM | POA: Diagnosis not present

## 2022-10-20 MED ORDER — CIPRO HC 0.2-1 % OT SUSP: 3.0000 [drp] | Freq: Two times a day (BID) | OTIC | 0 refills | Status: DC

## 2022-10-20 MED ORDER — AMOXICILLIN-POT CLAVULANATE 875-125 MG PO TABS: 1.0000 | ORAL_TABLET | Freq: Two times a day (BID) | ORAL | 0 refills | Status: DC

## 2022-10-20 NOTE — Progress Notes (Unsigned)
I,Connie R Striblin,acting as a Education administrator for [employer], PA-C.,have documented all relevant documentation on the behalf of Mardene Speak, PA-C,as directed by  [employer], PA-C while in the presence of [employer], PA-C.   Established patient visit   Patient: Leah Bright   DOB: 1990/11/23   32 y.o. Female  MRN: [id number] Visit Date: 10/20/2022  Today's healthcare provider: Mardene Speak, PA-C   No chief complaint on file.  Subjective     Otalgia  There is pain in the right ear. This is a new problem. The current episode started yesterday night . There has been no fever or chills. The pain is severe. Associated symptoms headaches and  jaw pain. comments: Eye edema Thursday, now gone, has been reoccurring for over a month.   Had a similar symptoms before and was treated for otitis media No viral infection Has a case of bad allergy   Medications: Outpatient Medications Prior to Visit  Medication Sig   amLODipine (NORVASC) 10 MG tablet Take 1 tablet (10 mg total) by mouth daily.   busPIRone (BUSPAR) 5 MG tablet Take 10 mg by mouth 2 (two) times daily. [facility]    glipiZIDE (GLUCOTROL) 10 MG tablet Take 1 tablet (10 mg total) by mouth 2 (two) times daily before a meal.   levothyroxine (SYNTHROID) 150 MCG tablet Take 1 tablet by mouth once daily   metFORMIN (GLUCOPHAGE-XR) 500 MG 24 hr tablet Take 2 tablets (1,000 mg total) by mouth 2 (two) times daily before a meal.   ondansetron (ZOFRAN-ODT) 4 MG disintegrating tablet Take 1 tablet (4 mg total) by mouth every 8 (eight) hours as needed for nausea or vomiting.   pioglitazone (ACTOS) 45 MG tablet Take 1 tablet (45 mg total) by mouth daily.   traZODone (DESYREL) 100 MG tablet Take 150 mg by mouth at bedtime as needed for sleep. [facility]     venlafaxine XR (EFFEXOR-XR) 75 MG 24 hr capsule Take 150 mg by mouth at bedtime. [facility]     No facility-administered medications prior to visit.     Review of Systems  HENT:  Positive for ear pain.   Neurological:  Negative for headaches.    {Labs  Heme  Chem  Endocrine  Serology  Results Review (optional):23779}   Objective    There were no vitals taken for this visit. {Show previous vital signs (optional):23777}  Physical Exam Vitals reviewed.  Constitutional:      General: She is not in acute distress.    Appearance: Normal appearance. She is well-developed. She is not diaphoretic.  HENT:     Head: Normocephalic and atraumatic.  Eyes:     General: No scleral icterus.    Conjunctiva/sclera: Conjunctivae normal.  Neck:     Thyroid: No thyromegaly.  Cardiovascular:     Rate and Rhythm: Normal rate and regular rhythm.     Pulses: Normal pulses.     Heart sounds: Normal heart sounds. No murmur heard. Pulmonary:     Effort: Pulmonary effort is normal. No respiratory distress.     Breath sounds: Normal breath sounds. No wheezing, rhonchi or rales.  Musculoskeletal:     Cervical back: Neck supple.     Right lower leg: No edema.     Left lower leg: No edema.  Lymphadenopathy:     Cervical: No cervical adenopathy.  Skin:    General: Skin is warm and dry.     Findings: No rash.  Neurological:  Mental Status: She is alert and oriented to person, place, and time. Mental status is at baseline.  Psychiatric:        Mood and Affect: Mood normal.        Behavior: Behavior normal.     ***  No results found for any visits on 10/20/22.  Assessment & Plan     1. Right ear pain Could be due to otitis externa, media Due to financial limitations, pt could not afford - amoxicillin-clavulanate (AUGMENTIN) 875-125 MG tablet; Take 1 tablet by mouth 2 (two) times daily.  Dispense: 14 tablet; Refill: 0 - ciprofloxacin-hydrocortisone (CIPRO HC) OTIC suspension; Place 3 drops into the right ear 2 (two) times daily.  Dispense: 10 mL; Refill: 0  The patient was advised to call back or seek an in-person evaluation if the  symptoms worsen or if the condition fails to improve as anticipated.  I discussed the assessment and treatment plan with the patient. The patient was provided an opportunity to ask questions and all were answered. The patient agreed with the plan and demonstrated an understanding of the instructions.  I, Mardene Speak, PA-C have reviewed all documentation for this visit. The documentation on  10/20/22 for the exam, diagnosis, procedures, and orders are all accurate and complete.  Mardene Speak, [facility], [place] [phone] (phone) [phone] (fax)

## 2022-10-20 NOTE — Telephone Encounter (Signed)
Hang from the Pharmacy states that the medication that was prescribed for the pt is not covered by her insurance. ciprofloxacin-hydrocortisone (CIPRO HC) OTIC suspension  Elbert Ewings is wanting to know if something else can be called in for the pt. Please advise.  [place] Phone: [phone]  Fax: [phone]      Please advise

## 2022-10-20 NOTE — Telephone Encounter (Signed)
  Chief Complaint: Ear Pain Symptoms: right earache, radiates to back of head,jaw on right side Frequency: Last night Pertinent Negatives: Patient denies fever, cold symptoms Disposition: []$ ED /[]$ Urgent Care (no appt availability in office) / [x]$ Appointment(In office/virtual)/ []$  [place] [facility]/ []$ Home Care/ []$ Refused Recommended Disposition /[]$ [place] [facility]/ []$  Follow-up with PCP Additional Notes: Appt secured for today. Care advise provided, pt verbalizes understanding. Reason for Disposition  Earache  (Exceptions: brief ear pain of < 60 minutes duration, earache occurring during air travel  Answer Assessment - Initial Assessment Questions 1. LOCATION: "Which ear is involved?"     Right 2. ONSET: "When did the ear start hurting"      Last night 3. SEVERITY: "How bad is the pain?"  (Scale 1-10; mild, moderate or severe)   - MILD (1-3): doesn't interfere with normal activities    - MODERATE (4-7): interferes with normal activities or awakens from sleep    - SEVERE (8-10): excruciating pain, unable to do any normal activities      5-8/10 4. URI SYMPTOMS: "Do you have a runny nose or cough?"     No 5. FEVER: "Do you have a fever?" If Yes, ask: "What is your temperature, how was it measured, and when did it start?"     No 6. CAUSE: "Have you been swimming recently?", "How often do you use Q-TIPS?", "Have you had any recent air travel or scuba diving?"     No 7. OTHER SYMPTOMS: "Do you have any other symptoms?" (e.g., headache, stiff neck, dizziness, vomiting, runny nose, decreased hearing)     Headache, radiates to jaw, eyes swollen Thursday, not presently  Protocols used: Earache-A-AH

## 2022-10-22 ENCOUNTER — Other Ambulatory Visit: Payer: Self-pay | Admitting: Physician Assistant

## 2022-12-07 ENCOUNTER — Emergency Department: Payer: Managed Care, Other (non HMO)

## 2022-12-07 ENCOUNTER — Emergency Department
Admission: EM | Admit: 2022-12-07 | Discharge: 2022-12-07 | Disposition: A | Discharge status: Home / Self Care | Payer: Managed Care, Other (non HMO) | Attending: Emergency Medicine | Admitting: Emergency Medicine

## 2022-12-07 ENCOUNTER — Other Ambulatory Visit: Payer: Self-pay

## 2022-12-07 DIAGNOSIS — R519 Headache, unspecified: Secondary | ICD-10-CM

## 2022-12-07 DIAGNOSIS — E1122 Type 2 diabetes mellitus with diabetic chronic kidney disease: Secondary | ICD-10-CM | POA: Insufficient documentation

## 2022-12-07 DIAGNOSIS — J018 Other acute sinusitis: Secondary | ICD-10-CM | POA: Diagnosis not present

## 2022-12-07 DIAGNOSIS — N189 Chronic kidney disease, unspecified: Secondary | ICD-10-CM | POA: Insufficient documentation

## 2022-12-07 DIAGNOSIS — Z79899 Other long term (current) drug therapy: Secondary | ICD-10-CM | POA: Diagnosis not present

## 2022-12-07 DIAGNOSIS — I129 Hypertensive chronic kidney disease with stage 1 through stage 4 chronic kidney disease, or unspecified chronic kidney disease: Secondary | ICD-10-CM | POA: Insufficient documentation

## 2022-12-07 DIAGNOSIS — E039 Hypothyroidism, unspecified: Secondary | ICD-10-CM | POA: Diagnosis not present

## 2022-12-07 DIAGNOSIS — J45909 Unspecified asthma, uncomplicated: Secondary | ICD-10-CM | POA: Insufficient documentation

## 2022-12-07 DIAGNOSIS — Z7984 Long term (current) use of oral hypoglycemic drugs: Secondary | ICD-10-CM | POA: Diagnosis not present

## 2022-12-07 LAB — POC URINE PREG, ED: Preg Test, Ur: NEGATIVE

## 2022-12-07 MED ORDER — AMOXICILLIN-POT CLAVULANATE 875-125 MG PO TABS
1.0000 | ORAL_TABLET | Freq: Once | ORAL | Status: AC
  Administered 2022-12-07: 1 via ORAL
  Filled 2022-12-07: qty 1

## 2022-12-07 MED ORDER — DEXTROSE-NACL 5-0.45 % IV SOLN: Freq: Once | INTRAVENOUS | Status: AC

## 2022-12-07 MED ORDER — METOCLOPRAMIDE HCL 5 MG/ML IJ SOLN
10.0000 mg | Freq: Once | INTRAMUSCULAR | Status: AC
  Administered 2022-12-07: 10 mg via INTRAVENOUS
  Filled 2022-12-07: qty 2

## 2022-12-07 MED ORDER — METOCLOPRAMIDE HCL 5 MG/ML IJ SOLN
5.0000 mg | Freq: Once | INTRAMUSCULAR | Status: AC
  Administered 2022-12-07: 5 mg via INTRAVENOUS
  Filled 2022-12-07: qty 2

## 2022-12-07 MED ORDER — AMOXICILLIN-POT CLAVULANATE 875-125 MG PO TABS: 1.0000 | ORAL_TABLET | Freq: Two times a day (BID) | ORAL | 0 refills | Status: DC

## 2022-12-07 MED ORDER — DEXAMETHASONE SODIUM PHOSPHATE 10 MG/ML IJ SOLN
10.0000 mg | Freq: Once | INTRAMUSCULAR | Status: AC
  Administered 2022-12-07: 10 mg via INTRAVENOUS
  Filled 2022-12-07: qty 1

## 2022-12-07 MED ORDER — PROCHLORPERAZINE MALEATE 10 MG PO TABS: 10.0000 mg | ORAL_TABLET | Freq: Four times a day (QID) | ORAL | 0 refills | Status: DC | PRN

## 2022-12-07 NOTE — ED Notes (Signed)
Dr. Marisa Severin at bedside reassessing pt at this time.

## 2022-12-07 NOTE — ED Provider Notes (Addendum)
-----------------------------------------   10:01 AM on 12/07/2022 -----------------------------------------   I took over care of this patient from Dr. Dolores Frame.  CT shows evidence of sinusitis.  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  The patient does not have any specific signs or symptoms to suggest elevated intracranial hypertension at this time and has no vision changes.  Overall presentation is consistent with sinusitis, especially given the confirmatory CT findings.  There is no indication for emergent MRI or further workup in the ED.  I counseled the patient on the results of the imaging and plan of care.  I have provided neurology referral.  She has been prescribed Compazine and Augmentin by Dr. Dolores Frame.  I gave the patient strict return precautions and she expressed understanding.  CT head:  IMPRESSION:  1. Largely normal noncontrast CT appearance of the brain, but  partially empty sella is more apparent compared to 2022. This can be  a normal variant, or be associated with idiopathic intracranial  hypertension (pseudotumor cerebri).  2. Mild to moderate paranasal sinus inflammation also new since  2022.    Dionne Bucy, MD 12/07/22 1007    Dionne Bucy, MD 12/07/22 1043

## 2022-12-07 NOTE — ED Provider Notes (Signed)
[facility] Provider Note    Event Date/Time   First MD Initiated Contact with Patient 12/07/22 0500     (approximate)   History   Headache   HPI  Leah Bright is a [age] y.o. female who presents to the ED from home with a chief complaint of headache.  Patient with a history of migraines.  Complains of frontal headache for several days, progressively worsened.  Complains of pressure behind her eyes and in her nose. + Photophobia.  Tried OTC medications without relief of symptoms.  Different from migraine headaches which affects the vertex of her head.  Endorses nausea without vomiting.  Denies neck pain, chest pain, shortness of breath, abdominal pain, dizziness.     Past Medical History   Past Medical History:  Diagnosis Date   Agitation    Allergy    Anxiety    Asthma    Chronic kidney disease    Depression    Diabetes mellitus without complication    Encounter for screening for HIV 02/24/2022   High cholesterol    Hypertension    Hypothyroidism    Left renal artery stenosis 09/07/2019   Migraine    OCD (obsessive compulsive disorder)    PTSD (post-traumatic stress disorder)    Renal artery stenosis    Right renal atrophy    Social anxiety disorder    Social anxiety disorder    Social phobia    Thyroid disease      Active Problem List   Patient Active Problem List   Diagnosis Date Noted   Encounter for screening for HIV 02/24/2022   Generalized anxiety disorder 05/21/2020   Mood disorder 01/19/2020   Elevated liver enzymes 01/19/2020   Hordeolum externum of left upper eyelid 10/13/2019   History of diabetes mellitus, type II 09/20/2019   Migraine without aura and without status migrainosus, not intractable 09/18/2019   Hypothyroidism 08/02/2018   Morbid obesity 08/02/2018   Type 2 diabetes mellitus without complication, without long-term current use of insulin 08/02/2018   Moderate recurrent major depression 03/02/2018    Stenosis of left renal artery 03/02/2018   Hypokalemia 09/27/2015   Hypomagnesemia 09/27/2015   Hypertension associated with diabetes 09/27/2015   Abdominal pain 01/22/2013   Diarrhea 01/22/2013     Past Surgical History   Past Surgical History:  Procedure Laterality Date   CHOLECYSTECTOMY  2008   endoscopy/colonoscopy   2011   LAPAROSCOPIC GASTRIC BANDING  2007   LAPAROSCOPIC REPAIR AND REMOVAL OF GASTRIC BAND  2011   TONSILLECTOMY  1994   WISDOM TOOTH EXTRACTION       Home Medications   Prior to Admission medications   Medication Sig Start Date End Date Taking? Authorizing Provider  amoxicillin-clavulanate (AUGMENTIN) 875-125 MG tablet Take 1 tablet by mouth 2 (two) times daily. 12/07/22  Yes Irean HongSung, Boy Delamater J, MD  prochlorperazine (COMPAZINE) 10 MG tablet Take 1 tablet (10 mg total) by mouth every 6 (six) hours as needed for nausea (headache). 12/07/22  Yes Irean HongSung, Babita Amaker J, MD  amLODipine (NORVASC) 10 MG tablet Take 1 tablet (10 mg total) by mouth daily. 02/24/22   Jacky KindlePayne, Elise T, FNP  busPIRone (BUSPAR) 5 MG tablet Take 10 mg by mouth 2 (two) times daily. [facility]  02/24/22   [provider]  ciprofloxacin-hydrocortisone (CIPRO HC) OTIC suspension Place 3 drops into the right ear 2 (two) times daily. 10/20/22   Ostwalt, Edmon CrapeJanna, PA-C  glipiZIDE (GLUCOTROL) 10 MG tablet Take 1 tablet (  10 mg total) by mouth 2 (two) times daily before a meal. Patient not taking: Reported on 10/20/2022 02/25/22   Jacky Kindle, FNP  levothyroxine (SYNTHROID) 150 MCG tablet Take 1 tablet by mouth once daily 06/16/22   Jacky Kindle, FNP  metFORMIN (GLUCOPHAGE-XR) 500 MG 24 hr tablet Take 2 tablets (1,000 mg total) by mouth 2 (two) times daily before a meal. 02/25/22   Jacky Kindle, FNP  ondansetron (ZOFRAN-ODT) 4 MG disintegrating tablet Take 1 tablet (4 mg total) by mouth every 8 (eight) hours as needed for nausea or vomiting. Patient not taking: Reported on 10/20/2022 07/24/21   Concha Se,  MD  pioglitazone (ACTOS) 45 MG tablet Take 1 tablet (45 mg total) by mouth daily. 02/25/22   Jacky Kindle, FNP  traZODone (DESYREL) 100 MG tablet Take 150 mg by mouth at bedtime as needed for sleep. [employer]   Patient not taking: Reported on 10/20/2022    [provider]  venlafaxine XR (EFFEXOR-XR) 75 MG 24 hr capsule Take 150 mg by mouth at bedtime. [employer]   Patient not taking: Reported on 10/20/2022    [provider]     Allergies  Toradol [ketorolac tromethamine], Codeine, Other, Pepto-bismol [bismuth], and Tramadol   Family History   Family History  Problem Relation Age of Onset   Cancer Mother        brain tumor   Hypertension Mother    Depression Mother    Diabetes Mother    Migraines Mother        benign tumor, still gets headaches sometimes now    Anxiety disorder Father    Depression Father    Hypertension Maternal Grandmother    Thyroid disease Maternal Grandmother    Diabetes Maternal Grandmother    Hypertension Maternal Grandfather    Cancer Maternal Grandfather        myositis   High blood pressure Maternal Grandfather    Diabetes Maternal Grandfather    Stroke Maternal Grandfather      Physical Exam  Triage Vital Signs: ED Triage Vitals  Enc Vitals Group     BP 12/07/22 0410 (!) 164/127     Pulse Rate 12/07/22 0410 (!) 114     Resp 12/07/22 0410 20     Temp 12/07/22 0419 98.5 F (36.9 C)     Temp Source 12/07/22 0410 Oral     SpO2 12/07/22 0410 97 %     Weight 12/07/22 0410 (!) 305 lb (138.3 kg)     Height 12/07/22 0410 5\' 5"  (1.651 m)     Head Circumference --      Peak Flow --      Pain Score 12/07/22 0419 7     Pain Loc --      Pain Edu? --      Excl. in GC? --     Updated Vital Signs: BP (!) 164/127 (BP Location: Right Arm)   Pulse (!) 114   Temp 98.5 F (36.9 C) (Oral)   Resp 20   Ht 5\' 5"  (1.651 m)   Wt (!) 138.3 kg   LMP 11/06/2022 (Approximate)   SpO2 97%   BMI 50.75 kg/m     General: Awake, no distress.  CV:  RRR.  Good peripheral perfusion.  Resp:  Normal effort.  CTAB. Abd:  No distention.  Other:  PERRL.  EOMI.  Frontal maxillary sinus pressure.  Nasal congestion.  Supple neck without meningismus.  CN II-XII intact.  5/5 motor strength and sensation all extremities.  No petechiae.   ED Results / Procedures / Treatments  Labs (all labs ordered are listed, but only abnormal results are displayed) Labs Reviewed  POC URINE PREG, ED     EKG  None   RADIOLOGY CT head pending   Official radiology report(s): No results found.   PROCEDURES:  Critical Care performed: No  Procedures   MEDICATIONS ORDERED IN ED: Medications  dextrose 5 %-0.45 % sodium chloride infusion (has no administration in time range)  dexamethasone (DECADRON) injection 10 mg (has no administration in time range)  metoCLOPramide (REGLAN) injection 5 mg (has no administration in time range)  amoxicillin-clavulanate (AUGMENTIN) 875-125 MG per tablet 1 tablet (has no administration in time range)     IMPRESSION / MDM / ASSESSMENT AND PLAN / ED COURSE  I reviewed the triage vital signs and the nursing notes.                             32 year old female presenting with headache and sinus pressure. Differential diagnosis includes, but is not limited to, intracranial hemorrhage, meningitis/encephalitis, previous head trauma, cavernous venous thrombosis, tension headache, temporal arteritis, migraine or migraine equivalent, idiopathic intracranial hypertension, and non-specific headache.  I have personally reviewed patient's records and note a PCP office visit for allergic rhinitis on 11/06/2022.  Patient's presentation is most consistent with acute complicated illness / injury requiring diagnostic workup.  Neck is supple and patient is without focal neurological deficits.  Elevated pressure likely secondary to pain but will obtain CT head to rule out intracranial  hemorrhage.  Will administer IV fluids, Decadron, Reglan and reassess.  Clinical Course as of 12/07/22 0654  Wynelle Link Dec 07, 2022  3734 Care will be signed out to oncoming provider Dr. Marisa Severin at change of shift pending CT and reassessment after IV medications. [JS]    Clinical Course User Index [JS] Irean Hong, MD     FINAL CLINICAL IMPRESSION(S) / ED DIAGNOSES   Final diagnoses:  Sinus headache     Rx / DC Orders   ED Discharge Orders          Ordered    amoxicillin-clavulanate (AUGMENTIN) 875-125 MG tablet  2 times daily        12/07/22 0615    prochlorperazine (COMPAZINE) 10 MG tablet  Every 6 hours PRN        12/07/22 0615             Note:  This document was prepared using Dragon voice recognition software and may include unintentional dictation errors.   Irean Hong, MD 12/07/22 [phone]

## 2022-12-07 NOTE — ED Notes (Signed)
Pt continues to report R head pressure after migraine cocktail. MD made aware.

## 2022-12-07 NOTE — Discharge Instructions (Addendum)
1.  Take and finish antibiotic as prescribed. 2.  You may take Compazine as needed for headache and nausea. 3.  Return to the ER for worsening symptoms, persistent vomiting, difficulty breathing or other concerns. 4.  Your CT scan shows some findings which could be related to a condition called idiopathic intracranial hypertension which means an elevated pressure in the fluid around your brain (although these findings are nonspecific, and you may not have this condition).  We have given you referral to follow-up with a neurologist.

## 2022-12-07 NOTE — ED Triage Notes (Signed)
Pt to ED via POV c/o headache. Pt says it started yesterday and has been getting worse. Says it feels like pressure behind her eyes and goes all the way to top of head. Sensitive to light. Pt has taken multiple OTC pain meds with no relief. Hx of migraines bu says this feels different. NAD at this time, denies CP, SOB.

## 2023-03-05 ENCOUNTER — Other Ambulatory Visit: Payer: Self-pay | Admitting: Family Medicine

## 2023-03-05 DIAGNOSIS — E1165 Type 2 diabetes mellitus with hyperglycemia: Secondary | ICD-10-CM

## 2023-03-06 NOTE — Telephone Encounter (Signed)
Requested medication (s) are due for refill today: yes  Requested medication (s) are on the active medication list: yes  Last refill:  02/25/22 #360/1  Future visit scheduled: no  Notes to clinic:  pt due for OV and updated labs. Called pt, LVMTCB     Requested Prescriptions  Pending Prescriptions Disp Refills   metFORMIN (GLUCOPHAGE-XR) 500 MG 24 hr tablet [Pharmacy Med Name: metFORMIN HCl ER 500 MG Oral Tablet Extended Release 24 Hour] 360 tablet 0    Sig: TAKE 2 TABLETS BY MOUTH TWICE DAILY BEFORE A MEAL     Endocrinology:  Diabetes - Biguanides Failed - 03/05/2023  5:34 PM      Failed - Cr in normal range and within 360 days    Creatinine, Ser  Date Value Ref Range Status  07/24/2021 0.67 0.44 - 1.00 mg/dL Final         Failed - HBA1C is between 0 and 7.9 and within 180 days    Hemoglobin A1C  Date Value Ref Range Status  02/24/2022 12.7 (A) 4.0 - 5.6 % Final   Hgb A1c MFr Bld  Date Value Ref Range Status  02/24/2022 12.4 (H) 4.8 - 5.6 % Final    Comment:             Prediabetes: 5.7 - 6.4          Diabetes: >6.4          Glycemic control for adults with diabetes: <7.0          Failed - eGFR in normal range and within 360 days    GFR calc Af Amer  Date Value Ref Range Status  05/22/2020 >60 >60 mL/min Final   GFR, Estimated  Date Value Ref Range Status  07/24/2021 >60 >60 mL/min Final    Comment:    (NOTE) Calculated using the CKD-EPI Creatinine Equation (2021)    eGFR  Date Value Ref Range Status  05/09/2021 103 >59 mL/min/1.73 Final         Failed - B12 Level in normal range and within 720 days    No results found for: "VITAMINB12"       Failed - CBC within normal limits and completed in the last 12 months    WBC  Date Value Ref Range Status  07/24/2021 8.1 4.0 - 10.5 K/uL Final   RBC  Date Value Ref Range Status  07/24/2021 5.24 (H) 3.87 - 5.11 MIL/uL Final   Hemoglobin  Date Value Ref Range Status  07/24/2021 14.0 12.0 - 15.0 g/dL Final   16/06/9603 54.0 11.1 - 15.9 g/dL Final   HCT  Date Value Ref Range Status  07/24/2021 40.9 36.0 - 46.0 % Final   Hematocrit  Date Value Ref Range Status  05/09/2021 40.6 34.0 - 46.6 % Final   MCHC  Date Value Ref Range Status  07/24/2021 34.2 30.0 - 36.0 g/dL Final   [facility]  Date Value Ref Range Status  07/24/2021 26.7 26.0 - 34.0 pg Final   MCV  Date Value Ref Range Status  07/24/2021 78.1 (L) 80.0 - 100.0 fL Final  05/09/2021 82 79 - 97 fL Final   No results found for: "PLTCOUNTKUC", "LABPLAT", "POCPLA" RDW  Date Value Ref Range Status  07/24/2021 13.2 11.5 - 15.5 % Final  05/09/2021 13.0 11.7 - 15.4 % Final         Passed - Valid encounter within last 6 months    Recent Outpatient Visits  4 months ago Right ear pain   [place] [facility] [place], [place], [place]   1 year ago Morbid obesity [facility])   [place] [facility] Merita Norton T, FNP   1 year ago Right wrist pain   [place] [facility] Alfredia Ferguson, PA-C   1 year ago Type 2 diabetes mellitus without complication, without long-term current use of insulin [facility])   [place] [facility] Jacky Kindle, FNP   1 year ago Subacute pansinusitis   [facility] [place], [place], [place]

## 2023-03-06 NOTE — Telephone Encounter (Signed)
Patient called, left VM to return the call to the office to scheduled an appt for medication refill request.   

## 2023-03-11 ENCOUNTER — Ambulatory Visit (INDEPENDENT_AMBULATORY_CARE_PROVIDER_SITE_OTHER): Payer: Managed Care, Other (non HMO) | Admitting: Family Medicine

## 2023-03-11 VITALS — BP 156/89 | HR 86 | Ht 65.0 in | Wt 299.0 lb

## 2023-03-11 DIAGNOSIS — Z794 Long term (current) use of insulin: Secondary | ICD-10-CM

## 2023-03-11 DIAGNOSIS — Z91199 Patient's noncompliance with other medical treatment and regimen due to unspecified reason: Secondary | ICD-10-CM | POA: Insufficient documentation

## 2023-03-11 DIAGNOSIS — F39 Unspecified mood [affective] disorder: Secondary | ICD-10-CM

## 2023-03-11 DIAGNOSIS — E1165 Type 2 diabetes mellitus with hyperglycemia: Secondary | ICD-10-CM | POA: Diagnosis not present

## 2023-03-11 DIAGNOSIS — I701 Atherosclerosis of renal artery: Secondary | ICD-10-CM

## 2023-03-11 DIAGNOSIS — E1159 Type 2 diabetes mellitus with other circulatory complications: Secondary | ICD-10-CM

## 2023-03-11 DIAGNOSIS — Z6841 Body Mass Index (BMI) 40.0 and over, adult: Secondary | ICD-10-CM

## 2023-03-11 DIAGNOSIS — I152 Hypertension secondary to endocrine disorders: Secondary | ICD-10-CM

## 2023-03-11 MED ORDER — BUSPIRONE HCL 10 MG PO TABS
10.0000 mg | ORAL_TABLET | Freq: Two times a day (BID) | ORAL | 3 refills | Status: DC
Start: 2023-03-11 — End: 2023-06-17

## 2023-03-11 NOTE — Progress Notes (Signed)
Established patient visit  Patient: Leah Bright   DOB: 1991-06-03   32 y.o. Female  MRN: [id number] Visit Date: 03/11/2023  Today's healthcare provider: Jacky Kindle, FNP  Re Introduced to nurse practitioner role and practice setting.  All questions answered.  Discussed provider/patient relationship and expectations.  Subjective    HPI  Request for refills of Buspar; and referral for psychiatry care; previously seen by [facility]. Reports they no longer take her insurance.  Medications: Outpatient Medications Prior to Visit  Medication Sig   [DISCONTINUED] amLODipine (NORVASC) 10 MG tablet Take 1 tablet (10 mg total) by mouth daily.   [DISCONTINUED] busPIRone (BUSPAR) 5 MG tablet Take 10 mg by mouth 2 (two) times daily. [employer]    [DISCONTINUED] levothyroxine (SYNTHROID) 150 MCG tablet Take 1 tablet by mouth once daily   [DISCONTINUED] metFORMIN (GLUCOPHAGE-XR) 500 MG 24 hr tablet TAKE 2 TABLETS BY MOUTH TWICE DAILY BEFORE A MEAL   [DISCONTINUED] pioglitazone (ACTOS) 45 MG tablet Take 1 tablet (45 mg total) by mouth daily.   [DISCONTINUED] prochlorperazine (COMPAZINE) 10 MG tablet Take 1 tablet (10 mg total) by mouth every 6 (six) hours as needed for nausea (headache).   [DISCONTINUED] amoxicillin-clavulanate (AUGMENTIN) 875-125 MG tablet Take 1 tablet by mouth 2 (two) times daily.   [DISCONTINUED] ciprofloxacin-hydrocortisone (CIPRO HC) OTIC suspension Place 3 drops into the right ear 2 (two) times daily.   [DISCONTINUED] glipiZIDE (GLUCOTROL) 10 MG tablet Take 1 tablet (10 mg total) by mouth 2 (two) times daily before a meal. (Patient not taking: Reported on 10/20/2022)   [DISCONTINUED] ondansetron (ZOFRAN-ODT) 4 MG disintegrating tablet Take 1 tablet (4 mg total) by mouth every 8 (eight) hours as needed for nausea or vomiting. (Patient not taking: Reported on 10/20/2022)   [DISCONTINUED] traZODone (DESYREL) 100 MG tablet Take 150 mg by mouth at bedtime as needed  for sleep. [employer]   (Patient not taking: Reported on 10/20/2022)   [DISCONTINUED] venlafaxine XR (EFFEXOR-XR) 75 MG 24 hr capsule Take 150 mg by mouth at bedtime. [employer]   (Patient not taking: Reported on 10/20/2022)   No facility-administered medications prior to visit.    Review of Systems  Last CBC Lab Results  Component Value Date   WBC 8.1 07/24/2021   HGB 14.0 07/24/2021   HCT 40.9 07/24/2021   MCV 78.1 (L) 07/24/2021   MCH 26.7 07/24/2021   RDW 13.2 07/24/2021   PLT 304 07/24/2021   Last metabolic panel Lab Results  Component Value Date   GLUCOSE 286 (H) 07/24/2021   NA 133 (L) 07/24/2021   K 4.2 07/24/2021   CL 102 07/24/2021   CO2 23 07/24/2021   BUN 8 07/24/2021   CREATININE 0.67 07/24/2021   GFRNONAA >60 07/24/2021   CALCIUM 9.0 07/24/2021   PHOS 3.0 07/19/2019   PROT 7.7 07/24/2021   ALBUMIN 4.1 07/24/2021   LABGLOB 2.3 05/09/2021   AGRATIO 1.7 05/09/2021   BILITOT 0.7 07/24/2021   ALKPHOS 81 07/24/2021   AST 38 07/24/2021   ALT 45 (H) 07/24/2021   ANIONGAP 8 07/24/2021   Last lipids Lab Results  Component Value Date   CHOL 205 (H) 05/09/2021   HDL 49 05/09/2021   LDLCALC 129 (H) 05/09/2021   TRIG 149 05/09/2021   CHOLHDL 4.0 07/19/2019   Last hemoglobin A1c Lab Results  Component Value Date   HGBA1C 12.4 (H) 02/24/2022   Last thyroid functions Lab Results  Component Value Date   TSH 6.990 (H)  02/24/2022   T4TOTAL 7.6 07/19/2019       Objective    BP (!) 156/89   Pulse 86   Ht 5\' 5"  (1.651 m)   Wt 299 lb (135.6 kg)   SpO2 96%   BMI 49.76 kg/m  BP Readings from Last 3 Encounters:  03/11/23 (!) 156/89  12/07/22 120/88  10/20/22 (!) 148/98   Wt Readings from Last 3 Encounters:  03/11/23 299 lb (135.6 kg)  12/07/22 (!) 305 lb (138.3 kg)  10/20/22 (!) 310 lb 12.8 oz (141 kg)      Physical Exam Vitals and nursing note reviewed.  Constitutional:      General: She is not in acute distress.    Appearance:  Normal appearance. She is obese. She is not ill-appearing, toxic-appearing or diaphoretic.  HENT:     Head: Normocephalic and atraumatic.     Mouth/Throat:     Comments: Hoarseness noted; denies concerns Cardiovascular:     Rate and Rhythm: Normal rate and regular rhythm.     Pulses: Normal pulses.     Heart sounds: Normal heart sounds. No murmur heard.    No friction rub. No gallop.  Pulmonary:     Effort: Pulmonary effort is normal. No respiratory distress.     Breath sounds: Normal breath sounds. No stridor. No wheezing, rhonchi or rales.  Chest:     Chest wall: No tenderness.  Abdominal:     General: Bowel sounds are normal.     Palpations: Abdomen is soft.  Musculoskeletal:        General: No swelling, tenderness, deformity or signs of injury. Normal range of motion.     Right lower leg: No edema.     Left lower leg: No edema.  Skin:    General: Skin is warm and dry.     Capillary Refill: Capillary refill takes less than 2 seconds.     Coloration: Skin is not jaundiced or pale.     Findings: No bruising, erythema, lesion or rash.  Neurological:     General: No focal deficit present.     Mental Status: She is alert and oriented to person, place, and time. Mental status is at baseline.     Cranial Nerves: No cranial nerve deficit.     Sensory: No sensory deficit.     Motor: No weakness.     Coordination: Coordination normal.  Psychiatric:        Mood and Affect: Mood is depressed. Affect is flat.        Behavior: Behavior normal.        Thought Content: Thought content normal.        Judgment: Judgment normal.    No results found for any visits on 03/11/23.  Assessment & Plan     Problem List Items Addressed This Visit       Cardiovascular and Mediastinum   Hypertension associated with diabetes (HCC)    Chronic; uncontrolled Declines labs at this time Medication review notes poor adherence to norvasc 10 mg daily Encourage consult with medication assistance  members to support chronic health needs       Stenosis of left renal artery (HCC)    Chronic; nonadherent to BP control BP remains elevated today Goal 129/79 Previously on norvsac 10 mg        Endocrine   Uncontrolled type 2 diabetes mellitus with hyperglycemia (HCC)    Chronic; uncontrolled Last A1c >12% Goal <7% Continue to recommend balanced, lower carb meals. Smaller  meal size, adding snacks. Choosing water as drink of choice and increasing purposeful exercise. Declines A1c or foot check today due to cost Previously prescribed -1000 mg metformin bid -actos 45 mg every day -glipizide 10 mg bid  Not on statin Not on ACE  Due for DM foot exam- declined Due for labs/urine micro albumin creatinine ratio- declined Due for vision exam- declined Due for dental exam- declined        Other   Mood disorder (HCC) - Primary    Chronic; continued flat affect and poor adherence to multiple co morbid conditions Request for referral as well as refills of buspar 10 mg BID; encourage follow up within 4-6 weeks Previously on 150 mg effexor; however, has since self discontinued medication       Relevant Orders   Ambulatory referral to Psychiatry   Morbid obesity (HCC)    Chronic Body mass index is 49.76 kg/m. Many co morbid conditions Discussed importance of healthy weight management Discussed diet and exercise       Nonadherence to medical treatment    Chronic; ongoing medical nonadherent behaviors s/s medication and medical care costs -patient is not up to date on medications -patient declines labs to assess chronic conditions including diabetes, thyroid, cell counts/blood chemistry for noted renal artery stenosis with HTN -morbid obesity remains Body mass index is 49.76 kg/m. -secure chat to social work and pop assistance nursing support to assist in SDOH needs Also due for PAP smear and TDAP; declined at this time as well      Return in about 4 weeks (around  04/08/2023) for annual examination, chonic disease management.     Leilani Merl, FNP, have reviewed all documentation for this visit. The documentation on 03/11/23 for the exam, diagnosis, procedures, and orders are all accurate and complete.  Jacky Kindle, FNP  [facility] [phone] (phone) [phone] (fax)  [facility]

## 2023-03-11 NOTE — Assessment & Plan Note (Signed)
Chronic Body mass index is 49.76 kg/m. Many co morbid conditions Discussed importance of healthy weight management Discussed diet and exercise

## 2023-03-11 NOTE — Assessment & Plan Note (Addendum)
Chronic; ongoing medical nonadherent behaviors s/s medication and medical care costs -patient is not up to date on medications -patient declines labs to assess chronic conditions including diabetes, thyroid, cell counts/blood chemistry for noted renal artery stenosis with HTN -morbid obesity remains Body mass index is 49.76 kg/m. -secure chat to social work and pop assistance nursing support to assist in SDOH needs Also due for PAP smear and TDAP; declined at this time as well

## 2023-03-11 NOTE — Assessment & Plan Note (Signed)
Chronic; nonadherent to BP control BP remains elevated today Goal 129/79 Previously on norvsac 10 mg

## 2023-03-11 NOTE — Assessment & Plan Note (Addendum)
Chronic; uncontrolled Last A1c >12% Goal <7% Continue to recommend balanced, lower carb meals. Smaller meal size, adding snacks. Choosing water as drink of choice and increasing purposeful exercise. Declines A1c or foot check today due to cost Previously prescribed -1000 mg metformin bid -actos 45 mg every day -glipizide 10 mg bid  Not on statin Not on ACE  Due for DM foot exam- declined Due for labs/urine micro albumin creatinine ratio- declined Due for vision exam- declined Due for dental exam- declined

## 2023-03-11 NOTE — Assessment & Plan Note (Signed)
Chronic; uncontrolled Declines labs at this time Medication review notes poor adherence to norvasc 10 mg daily Encourage consult with medication assistance members to support chronic health needs

## 2023-03-11 NOTE — Assessment & Plan Note (Addendum)
Chronic; continued flat affect and poor adherence to multiple co morbid conditions Request for referral as well as refills of buspar 10 mg BID; encourage follow up within 4-6 weeks Previously on 150 mg effexor; however, has since self discontinued medication

## 2023-04-21 ENCOUNTER — Other Ambulatory Visit: Payer: Self-pay | Admitting: Family Medicine

## 2023-04-21 DIAGNOSIS — I152 Hypertension secondary to endocrine disorders: Secondary | ICD-10-CM

## 2023-04-21 DIAGNOSIS — E1165 Type 2 diabetes mellitus with hyperglycemia: Secondary | ICD-10-CM

## 2023-04-22 ENCOUNTER — Telehealth: Payer: Managed Care, Other (non HMO) | Admitting: Family Medicine

## 2023-04-22 ENCOUNTER — Ambulatory Visit: Payer: Self-pay

## 2023-04-22 ENCOUNTER — Emergency Department
Admission: EM | Admit: 2023-04-22 | Discharge: 2023-04-22 | Disposition: A | Discharge status: Home / Self Care | Payer: Managed Care, Other (non HMO) | Attending: Emergency Medicine | Admitting: Emergency Medicine

## 2023-04-22 ENCOUNTER — Encounter: Payer: Self-pay | Admitting: Family Medicine

## 2023-04-22 DIAGNOSIS — J45909 Unspecified asthma, uncomplicated: Secondary | ICD-10-CM | POA: Insufficient documentation

## 2023-04-22 DIAGNOSIS — U071 COVID-19: Secondary | ICD-10-CM | POA: Diagnosis not present

## 2023-04-22 DIAGNOSIS — R509 Fever, unspecified: Secondary | ICD-10-CM | POA: Diagnosis present

## 2023-04-22 DIAGNOSIS — I10 Essential (primary) hypertension: Secondary | ICD-10-CM | POA: Diagnosis not present

## 2023-04-22 DIAGNOSIS — E039 Hypothyroidism, unspecified: Secondary | ICD-10-CM | POA: Insufficient documentation

## 2023-04-22 DIAGNOSIS — J4520 Mild intermittent asthma, uncomplicated: Secondary | ICD-10-CM | POA: Insufficient documentation

## 2023-04-22 LAB — RESP PANEL BY RT-PCR (RSV, FLU A&B, COVID)  RVPGX2
Influenza A by PCR: NEGATIVE
Influenza B by PCR: NEGATIVE
Resp Syncytial Virus by PCR: NEGATIVE
SARS Coronavirus 2 by RT PCR: POSITIVE — AB

## 2023-04-22 LAB — GROUP A STREP BY PCR: Group A Strep by PCR: NOT DETECTED

## 2023-04-22 MED ORDER — NIRMATRELVIR/RITONAVIR (PAXLOVID)TABLET
3.0000 | ORAL_TABLET | Freq: Two times a day (BID) | ORAL | 0 refills | Status: AC
Start: 2023-04-22 — End: 2023-04-27

## 2023-04-22 MED ORDER — ONDANSETRON 4 MG PO TBDP: 4.0000 mg | ORAL_TABLET | Freq: Three times a day (TID) | ORAL | 0 refills | Status: DC | PRN

## 2023-04-22 MED ORDER — PSEUDOEPH-BROMPHEN-DM 30-2-10 MG/5ML PO SYRP: 5.0000 mL | ORAL_SOLUTION | Freq: Four times a day (QID) | ORAL | 0 refills | Status: DC | PRN

## 2023-04-22 MED ORDER — BENZONATATE 200 MG PO CAPS: 200.0000 mg | ORAL_CAPSULE | Freq: Three times a day (TID) | ORAL | 0 refills | Status: DC | PRN

## 2023-04-22 MED ORDER — BENZONATATE 100 MG PO CAPS
200.0000 mg | ORAL_CAPSULE | Freq: Once | ORAL | Status: AC
  Administered 2023-04-22: 200 mg via ORAL
  Filled 2023-04-22: qty 2

## 2023-04-22 MED ORDER — ALBUTEROL SULFATE HFA 108 (90 BASE) MCG/ACT IN AERS
2.0000 | INHALATION_SPRAY | Freq: Four times a day (QID) | RESPIRATORY_TRACT | 1 refills | Status: AC | PRN
Start: 2023-04-22 — End: ?

## 2023-04-22 NOTE — Assessment & Plan Note (Signed)
Due to presence of multiple significant comorbidities, will start patient on Paxlovid.  Ordered home temperature monitoring.  Advised patient to take the benzonatate prescribed this morning for her cough.  Also prescribed an additional albuterol inhaler as noted, as her previous prescription likely expired.   - Encouraged patient to take Tylenol as needed for pain and/or fever.  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   - Counseled patient on supportive measures and encouraged her to be mindful of her food selections to avoid pushing herself further into hyperglycemia than her illness already will.  Due to patient's poor appetite, she will nevertheless be trying crackers, applesauce, yogurt and similar foods, so that she is eating something.  Advised her that sugar-free popsicles and Gatorade Zero are useful for maintaining hydration and encouraged her to continue this. - Gave strict return/ER precautions.

## 2023-04-22 NOTE — Discharge Instructions (Addendum)
Your exam and labs overall reassuring at this time.  You do have COVID as confirmed by your test.  Strep test negative this time.  Take the prescription cough medicine as needed.  Follow-up primary provider return to ED if needed.

## 2023-04-22 NOTE — ED Provider Notes (Signed)
[facility] Emergency Department Provider Note     Event Date/Time   First MD Initiated Contact with Patient 04/22/23 1140     (approximate)   History   Fever   HPI  Leah Bright is a 32 y.o. female with a history of asthma, HTN, hypothyroidism, morbid obesity, and type 2 diabetes, presents to the ED from home.  She reports onset of some bodyaches and a fever last night of 100.2 F.  She also endorses sore throat.  She presents to the ED for evaluation of her symptoms.  Physical Exam   Triage Vital Signs: ED Triage Vitals  Encounter Vitals Group     BP 04/22/23 1126 (!) 150/96     Systolic BP Percentile --      Diastolic BP Percentile --      Pulse Rate 04/22/23 1122 90     Resp 04/22/23 1122 20     Temp 04/22/23 1122 99.6 F (37.6 C)     Temp Source 04/22/23 1122 Oral     SpO2 04/22/23 1122 99 %     Weight --      Height --      Head Circumference --      Peak Flow --      Pain Score 04/22/23 1125 4     Pain Loc --      Pain Education --      Exclude from Growth Chart --     Most recent vital signs: Vitals:   04/22/23 1122 04/22/23 1126  BP:  (!) 150/96  Pulse: 90   Resp: 20   Temp: 99.6 F (37.6 C)   SpO2: 99%     General Awake, no distress. NAD HEENT NCAT. PERRL. EOMI. No rhinorrhea. Mucous membranes are moist.  Uvula is midline tonsils are absent.  No oropharyngeal lesions appreciated. CV:  Good peripheral perfusion. RRR RESP:  Normal effort. CTA ABD:  No distention.   ED Results / Procedures / Treatments   Labs (all labs ordered are listed, but only abnormal results are displayed) Labs Reviewed  RESP PANEL BY RT-PCR (RSV, FLU A&B, COVID)  [id number] - Abnormal; Notable for the following components:      Result Value   SARS Coronavirus 2 by RT PCR POSITIVE (*)    All other components within normal limits  GROUP A STREP BY PCR   EKG   RADIOLOGY   No results found.   PROCEDURES:  Critical Care performed:  No  Procedures   MEDICATIONS ORDERED IN ED: Medications  benzonatate (TESSALON) capsule 200 mg (200 mg Oral Given 04/22/23 1213)     IMPRESSION / MDM / ASSESSMENT AND PLAN / ED COURSE  I reviewed the triage vital signs and the nursing notes.                              Differential diagnosis includes, but is not limited to,, flu, RSV, strep pharyngitis, AOM, viral URI, viral gastroenteritis  Patient's presentation is most consistent with acute complicated illness / injury requiring diagnostic workup.  Patient's diagnosis is consistent with COVID.  With confirmed COVID PCR.  Patient will be discharged home with prescriptions for Bromfed syrup, Tessalon Perles, and Zofran ODT. Patient is to follow up with her PCP as needed or otherwise directed. Patient is given ED precautions to return to the ED for any worsening or new symptoms.  FINAL CLINICAL IMPRESSION(S) / ED  DIAGNOSES   Final diagnoses:  COVID-19     Rx / DC Orders   ED Discharge Orders          Ordered    brompheniramine-pseudoephedrine-DM 30-2-10 MG/5ML syrup  4 times daily PRN        04/22/23 1259    benzonatate (TESSALON) 200 MG capsule  3 times daily PRN        04/22/23 1259    ondansetron (ZOFRAN-ODT) 4 MG disintegrating tablet  Every 8 hours PRN        04/22/23 1259             Note:  This document was prepared using Dragon voice recognition software and may include unintentional dictation errors.    Lissa Hoard, PA-C 04/22/23 1302    Minna Antis, MD 04/22/23 1525

## 2023-04-22 NOTE — Assessment & Plan Note (Signed)
Will prescribe an additional albuterol inhaler in case patient runs out, as she is experiencing shortness of breath with this illness.

## 2023-04-22 NOTE — Progress Notes (Signed)
MyChart Video Visit    Virtual Visit via Video Note   This format is felt to be most appropriate for this patient at this time. Physical exam was limited by quality of the video and audio technology used for the visit.   Patient location: Home Provider location: [facility]  I discussed the limitations of evaluation and management by telemedicine and the availability of in person appointments. The patient expressed understanding and agreed to proceed.  Patient: Leah Bright   DOB: Jan 22, 1991   32 y.o. Female  MRN: [id number] Visit Date: 04/22/2023  Today's healthcare provider: Sherlyn Hay, DO   Chief Complaint  Patient presents with   Covid Positive    Started symptoms yesterday.  Some difficulty breathing, cough, fever, chest tightness.   Subjective    HPI HPI     Covid Positive    Additional comments: Started symptoms yesterday.  Some difficulty breathing, cough, fever, chest tightness.      Last edited by Adline Peals, CMA on 04/22/2023  1:52 PM.      Tested at the hospital this morning; was told the result an hour ago. Son's friend's mom's coworker's tested positive for covid; son's friend's mom has been testing consistently negative.  However, her son's friend wasn't feeling when he was over over the weekend. He has not been tested.  T-max 100.2 last night Took nyquil last night and dayquil today Loose stool x2 (last night and day before) - these were both very green. No urgency/incontinence. Had a snow cone last night but hasn't been hungry and hasn't eaten since.  Husband is getting her gatorade zero.  Had covid when it first started; has never taken paxlovid   At the ER this morning, she was prescribed benzonatate, cough med, and zofran  History of asthma: has a purple inhaler she does not remember the name of in addition to albuterol    Medications: Outpatient Medications Prior to Visit  Medication Sig Note   amLODipine  (NORVASC) 10 MG tablet Take 10 mg by mouth daily.    busPIRone (BUSPAR) 10 MG tablet Take 1 tablet (10 mg total) by mouth 2 (two) times daily. 04/22/2023: Patient states she takes 5 mg BID   cetirizine (ZYRTEC) 10 MG tablet Take 10 mg by mouth daily.    fluticasone (VERAMYST) 27.5 MCG/SPRAY nasal spray Place 2 sprays into the nose daily.    levothyroxine (SYNTHROID) 150 MCG tablet Take 150 mcg by mouth daily before breakfast.    metFORMIN (GLUCOPHAGE) 500 MG tablet Take 1,000 mg by mouth 2 (two) times daily with a meal.    pioglitazone (ACTOS) 45 MG tablet Take 45 mg by mouth daily.    [DISCONTINUED] albuterol (VENTOLIN HFA) 108 (90 Base) MCG/ACT inhaler Inhale 1-2 puffs into the lungs every 6 (six) hours as needed for wheezing or shortness of breath.    benzonatate (TESSALON) 200 MG capsule Take 1 capsule (200 mg total) by mouth 3 (three) times daily as needed for cough. Take 1-2 tabs TID prn cough    brompheniramine-pseudoephedrine-DM 30-2-10 MG/5ML syrup Take 5 mLs by mouth 4 (four) times daily as needed.    ondansetron (ZOFRAN-ODT) 4 MG disintegrating tablet Take 1 tablet (4 mg total) by mouth every 8 (eight) hours as needed for nausea or vomiting.    No facility-administered medications prior to visit.    Review of Systems  Constitutional:  Positive for activity change, appetite change (not hungry), chills, fatigue and fever (tactile).  Respiratory:  Positive for cough, chest tightness and shortness of breath. Negative for wheezing.   Cardiovascular:  Negative for chest pain, palpitations and leg swelling.  Gastrointestinal:  Positive for diarrhea. Negative for abdominal pain, constipation, nausea and vomiting.  Musculoskeletal:  Positive for myalgias (yesterday).  Neurological:  Negative for weakness and headaches.        Objective    There were no vitals taken for this visit.      Physical Exam Constitutional:      General: She is not in acute distress.    Appearance:  Normal appearance.  HENT:     Head: Normocephalic.  Eyes:     General: No scleral icterus.       Right eye: No discharge.        Left eye: No discharge.     Conjunctiva/sclera: Conjunctivae normal.  Pulmonary:     Effort: Pulmonary effort is normal. No respiratory distress.     Comments: +dry cough Neurological:     Mental Status: She is alert and oriented to person, place, and time. Mental status is at baseline.  Psychiatric:        Mood and Affect: Mood normal.        Behavior: Behavior normal.        Assessment & Plan    Lab test positive for detection of COVID-19 virus Assessment & Plan: Due to presence of multiple significant comorbidities, will start patient on Paxlovid.  Ordered home temperature monitoring.  Advised patient to take the benzonatate prescribed this morning for her cough.  Also prescribed an additional albuterol inhaler as noted, as her previous prescription likely expired.   - Encouraged patient to take Tylenol as needed for pain and/or fever.  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   - Counseled patient on supportive measures and encouraged her to be mindful of her food selections to avoid pushing herself further into hyperglycemia than her illness already will.  Due to patient's poor appetite, she will nevertheless be trying crackers, applesauce, yogurt and similar foods, so that she is eating something.  Advised her that sugar-free popsicles and Gatorade Zero are useful for maintaining hydration and encouraged her to continue this. - Gave strict return/ER precautions.    Orders: -     MYCHART COVID-19 HOME MONITORING PROGRAM; Future -     Albuterol Sulfate HFA; Inhale 2 puffs into the lungs every 6 (six) hours as needed for wheezing or shortness of breath.  Dispense: 1 each; Refill: 1 -     nirmatrelvir/ritonavir; Take 3 tablets by mouth 2 (two) times daily for 5 days. (Take nirmatrelvir 150 mg two tablets twice daily for 5  days and ritonavir 100 mg one tablet twice daily for 5 days) Patient GFR is 102  Dispense: 30 tablet; Refill: 0 -     MyChart Temperature FLOWSHEET; Future  Asthma in adult, mild intermittent, uncomplicated Assessment & Plan: Will prescribe an additional albuterol inhaler in case patient runs out, as she is experiencing shortness of breath with this illness.    Return if symptoms worsen or fail to improve.     I discussed the assessment and treatment plan with the patient. The patient was provided an opportunity to ask questions and all were answered. The patient agreed with the plan and demonstrated an understanding of the instructions.   The patient was advised to call back or seek an in-person evaluation if the symptoms worsen or if  the condition fails to improve as anticipated.  I provided 21 minutes of virtual-face-to-face time during this encounter.  I discussed the assessment and treatment plan with the patient  The patient was provided an opportunity to ask questions and all were answered. The patient agreed with the plan and demonstrated an understanding of the instructions.   The patient was advised to call back or seek an in-person evaluation if the symptoms worsen or if the condition fails to improve as anticipated.   Sherlyn Hay, DO [facility] [phone] (phone) [phone] (fax)  [facility]

## 2023-04-22 NOTE — Telephone Encounter (Signed)
Chief Complaint: COVID positive tested today in ED Symptoms: Cough, hurts to take a breath, diarrhea, sore throat, fever/body aches yesterday Frequency: Onset symptoms yesterday Pertinent Negatives: Patient denies other symptoms Disposition: [] ED /[] Urgent Care (no appt availability in office) / [x] Appointment(In office/virtual)/ []  [facility]/ [] Home Care/ [] Refused Recommended Disposition /[] [place] [facility]/ []  Follow-up with PCP Additional Notes: Patient was seen in ED today, she says nothing was given as far as medication but something for nausea. Advised she will need a visit, scheduled MyChart today with Dr. Payton Mccallum.   Reason for Disposition  MILD difficulty breathing (e.g., minimal/no SOB at rest, SOB with walking, pulse <100)  Answer Assessment - Initial Assessment Questions 1. COVID-19 DIAGNOSIS: "How do you know that you have COVID?" (e.g., positive lab test or self-test, diagnosed by doctor or NP/PA, symptoms after exposure).     Went to ED and tested positive 2. COVID-19 EXPOSURE: "Was there any known exposure to COVID before the symptoms began?" CDC Definition of close contact: within 6 feet (2 meters) for a total of 15 minutes or more over a 24-hour period.      Son's friend possibly 3. ONSET: "When did the COVID-19 symptoms start?"      Yesterday 4. WORST SYMPTOM: "What is your worst symptom?" (e.g., cough, fever, shortness of breath, muscle aches)     Cough, hard to breathe 5. COUGH: "Do you have a cough?" If Yes, ask: "How bad is the cough?"       Yes 6. FEVER: "Do you have a fever?" If Yes, ask: "What is your temperature, how was it measured, and when did it start?"     Yesterday 7. RESPIRATORY STATUS: "Describe your breathing?" (e.g., normal; shortness of breath, wheezing, unable to speak)      Hurts to take a breath 8. BETTER-SAME-WORSE: "Are you getting better, staying the same or getting worse compared to yesterday?"  If getting worse, ask, "In what  way?"     A little better 9. OTHER SYMPTOMS: "Do you have any other symptoms?"  (e.g., chills, fatigue, headache, loss of smell or taste, muscle pain, sore throat)     Diarrhea, sore throat 10. HIGH RISK DISEASE: "Do you have any chronic medical problems?" (e.g., asthma, heart or lung disease, weak immune system, obesity, etc.)       Yes asthma  Protocols used: Coronavirus (COVID-19) Diagnosed or Suspected-A-AH

## 2023-04-22 NOTE — ED Triage Notes (Signed)
Pt to ED via POV from home. Pt reports last night started having body aches, temp of 100.2 and sore throat.

## 2023-04-25 ENCOUNTER — Encounter (INDEPENDENT_AMBULATORY_CARE_PROVIDER_SITE_OTHER): Payer: Self-pay

## 2023-05-02 ENCOUNTER — Other Ambulatory Visit: Payer: Self-pay | Admitting: Family Medicine

## 2023-05-05 NOTE — Telephone Encounter (Signed)
Requested medication (s) are due for refill today - unsure  Requested medication (s) are on the active medication list -yes  Future visit scheduled -no  Last refill: unsure  Notes to clinic: all medications requested are listed as historical provider- need PCP review   Requested Prescriptions  Pending Prescriptions Disp Refills   amLODipine (NORVASC) 10 MG tablet [Pharmacy Med Name: amLODIPine Besylate 10 MG Oral Tablet] 90 tablet 0    Sig: Take 1 tablet by mouth once daily     Cardiovascular: Calcium Channel Blockers 2 Failed - 05/02/2023  3:01 PM      Failed - Last BP in normal range    BP Readings from Last 1 Encounters:  04/22/23 (!) 150/96         Passed - Last Heart Rate in normal range    Pulse Readings from Last 1 Encounters:  04/22/23 90         Passed - Valid encounter within last 6 months    Recent Outpatient Visits           1 week ago Lab test positive for detection of COVID-19 virus   [facility] Pardue, Monico Blitz, DO   1 month ago Mood disorder [facility])   [facility] Jacky Kindle, FNP   6 months ago Right ear pain   [place] [facility] [place], [place], PA-C   1 year ago Morbid obesity [place])   [facility] Merita Norton T, FNP   1 year ago Right wrist pain   [facility] Alfredia Ferguson, PA-C               levothyroxine (SYNTHROID) 150 MCG tablet [Pharmacy Med Name: Levothyroxine Sodium 150 MCG Oral Tablet] 90 tablet 0    Sig: Take 1 tablet by mouth once daily     Endocrinology:  Hypothyroid Agents Failed - 05/02/2023  3:01 PM      Failed - TSH in normal range and within 360 days    TSH  Date Value Ref Range Status  02/24/2022 6.990 (H) 0.450 - 4.500 uIU/mL Final         Passed - Valid encounter within last 12 months    Recent Outpatient Visits           1 week ago Lab test positive for detection of COVID-19 virus   [facility]ardue, Monico Blitz, DO   1 month ago Mood disorder [facility])   [place] [facility] Jacky Kindle, FNP   6 months ago Right ear pain   [facility] [place], [place], PA-C   1 year ago Morbid obesity [facility])   [place] [facility] Merita Norton T, FNP   1 year ago Right wrist pain    [facility] [place], Lillia Abed, PA-C               pioglitazone (ACTOS) 45 MG tablet [Pharmacy Med Name: PIOGLITAZONE 45MG  TAB] 90 tablet 0    Sig: Take 1 tablet by mouth once daily     Endocrinology:  Diabetes - Glitazones - pioglitazone Failed - 05/02/2023  3:01 PM      Failed - HBA1C is between 0 and 7.9 and within 180 days    Hemoglobin A1C  Date Value Ref Range Status  02/24/2022 12.7 (A) 4.0 - 5.6 % Final   Hgb A1c MFr Bld  Date  Value Ref Range Status  02/24/2022 12.4 (H) 4.8 - 5.6 % Final    Comment:             Prediabetes: 5.7 - 6.4          Diabetes: >6.4          Glycemic control for adults with diabetes: <7.0          Passed - Valid encounter within last 6 months    Recent Outpatient Visits           1 week ago Lab test positive for detection of COVID-19 virus   [facility], Monico Blitz, DO   1 month ago Mood disorder [facility])   [place] [facility] Jacky Kindle, FNP   6 months ago Right ear pain   [place] [facility] Lyman, [place], PA-C   1 year ago Morbid obesity [facility])   [facility] Merita Norton T, FNP   1 year ago Right wrist pain   [place] [facility] Alfredia Ferguson, PA-C                 Requested Prescriptions  Pending Prescriptions Disp Refills   amLODipine (NORVASC) 10 MG tablet [Pharmacy Med Name: amLODIPine Besylate 10 MG Oral Tablet] 90 tablet 0    Sig: Take 1 tablet by mouth once daily     Cardiovascular: Calcium Channel Blockers 2  Failed - 05/02/2023  3:01 PM      Failed - Last BP in normal range    BP Readings from Last 1 Encounters:  04/22/23 (!) 150/96         Passed - Last Heart Rate in normal range    Pulse Readings from Last 1 Encounters:  04/22/23 90         Passed - Valid encounter within last 6 months    Recent Outpatient Visits           1 week ago Lab test positive for detection of COVID-19 virus   [facility] P[place], Monico Blitz, DO   1 month ago Mood disorder [facility])   [facility] Jacky Kindle, FNP   6 months ago Right ear pain   [place] [facility] [place], [place], PA-C   1 year ago Morbid obesity [facility])   [place] [facility] Merita Norton T, FNP   1 year ago Right wrist pain    [facility] Alfredia Ferguson, PA-C               levothyroxine (SYNTHROID) 150 MCG tablet [Pharmacy Med Name: Levothyroxine Sodium 150 MCG Oral Tablet] 90 tablet 0    Sig: Take 1 tablet by mouth once daily     Endocrinology:  Hypothyroid Agents Failed - 05/02/2023  3:01 PM      Failed - TSH in normal range and within 360 days    TSH  Date Value Ref Range Status  02/24/2022 6.990 (H) 0.450 - 4.500 uIU/mL Final         Passed - Valid encounter within last 12 months    Recent Outpatient Visits           1 week ago Lab test positive for detection of COVID-19 virus   [facility] [place], Monico Blitz, DO   1 month ago Mood disorder [facility])   [facility] Merita Norton  T, FNP   6 months ago Right ear pain   [facility], [place], PA-C   1 year ago Morbid obesity [facility])   [place] [facility] Merita Norton T, FNP   1 year ago Right wrist pain   [facility] Alfredia Ferguson, PA-C               pioglitazone (ACTOS) 45 MG tablet [Pharmacy Med Name: PIOGLITAZONE 45MG  TAB] 90  tablet 0    Sig: Take 1 tablet by mouth once daily     Endocrinology:  Diabetes - Glitazones - pioglitazone Failed - 05/02/2023  3:01 PM      Failed - HBA1C is between 0 and 7.9 and within 180 days    Hemoglobin A1C  Date Value Ref Range Status  02/24/2022 12.7 (A) 4.0 - 5.6 % Final   Hgb A1c MFr Bld  Date Value Ref Range Status  02/24/2022 12.4 (H) 4.8 - 5.6 % Final    Comment:             Prediabetes: 5.7 - 6.4          Diabetes: >6.4          Glycemic control for adults with diabetes: <7.0          Passed - Valid encounter within last 6 months    Recent Outpatient Visits           1 week ago Lab test positive for detection of COVID-19 virus   [facility] [place], Monico Blitz, DO   1 month ago Mood disorder [facility])   [facility] Jacky Kindle, FNP   6 months ago Right ear pain   [facility] [place], [place], PA-C   1 year ago Morbid obesity [facility])   [place] Merita Norton T, FNP   1 year ago Right wrist pain   [place] [facility] Alfredia Ferguson, [place]

## 2023-05-11 ENCOUNTER — Other Ambulatory Visit: Payer: Self-pay | Admitting: Family Medicine

## 2023-05-12 NOTE — Telephone Encounter (Signed)
Requested medication (s) are due for refill today: Yes  Requested medication (s) are on the active medication list: Yes  Last refill:  04/22/23  Future visit scheduled: Yes  Notes to clinic:  Historical provider.    Requested Prescriptions  Pending Prescriptions Disp Refills   amLODipine (NORVASC) 10 MG tablet [Pharmacy Med Name: amLODIPine Besylate 10 MG Oral Tablet] 90 tablet 0    Sig: Take 1 tablet by mouth once daily     Cardiovascular: Calcium Channel Blockers 2 Failed - 05/11/2023 12:24 PM      Failed - Last BP in normal range    BP Readings from Last 1 Encounters:  04/22/23 (!) 150/96         Passed - Last Heart Rate in normal range    Pulse Readings from Last 1 Encounters:  04/22/23 90         Passed - Valid encounter within last 6 months    Recent Outpatient Visits           2 weeks ago Lab test positive for detection of COVID-19 virus   [facility], Monico Blitz, DO   2 months ago Mood disorder [facility])   [place] [facility] Merita Norton T, FNP   6 months ago Right ear pain   [place] [facility] [place], [place], PA-C   1 year ago Morbid obesity [facility])   [place] [facility] Merita Norton T, FNP   1 year ago Right wrist pain   [place] [facility] Alfredia Ferguson, [place]       Future Appointments             Tomorrow Jacky Kindle, FNP [facility], [facility]             pioglitazone (ACTOS) 45 MG tablet [Pharmacy Med Name: PIOGLITAZONE 45MG  TAB] 90 tablet 0    Sig: Take 1 tablet by mouth once daily     Endocrinology:  Diabetes - Glitazones - pioglitazone Failed - 05/11/2023 12:24 PM      Failed - HBA1C is between 0 and 7.9 and within 180 days    Hemoglobin A1C  Date Value Ref Range Status  02/24/2022 12.7 (A) 4.0 - 5.6 % Final   Hgb A1c MFr Bld  Date Value Ref Range Status  02/24/2022 12.4 (H) 4.8 - 5.6 % Final    Comment:              Prediabetes: 5.7 - 6.4          Diabetes: >6.4          Glycemic control for adults with diabetes: <7.0          Passed - Valid encounter within last 6 months    Recent Outpatient Visits           2 weeks ago Lab test positive for detection of COVID-19 virus   [facility] [place], Monico Blitz, DO   2 months ago Mood disorder [facility])   [place] [facility] Jacky Kindle, FNP   6 months ago Right ear pain   [place] [facility], [place], PA-C   1 year ago Morbid obesity [facility])   [place] [facility] Merita Norton T, FNP   1 year ago Right wrist pain   [place] [facility] Alfredia Ferguson, [place]       Future Appointments  Tomorrow Jacky Kindle, FNP [place] [facility], [facility]

## 2023-05-13 ENCOUNTER — Ambulatory Visit (INDEPENDENT_AMBULATORY_CARE_PROVIDER_SITE_OTHER): Payer: Managed Care, Other (non HMO) | Admitting: Family Medicine

## 2023-05-13 ENCOUNTER — Encounter: Payer: Self-pay | Admitting: Family Medicine

## 2023-05-13 VITALS — BP 125/84 | HR 92 | Ht 65.0 in | Wt 298.6 lb

## 2023-05-13 DIAGNOSIS — F39 Unspecified mood [affective] disorder: Secondary | ICD-10-CM

## 2023-05-13 DIAGNOSIS — E1165 Type 2 diabetes mellitus with hyperglycemia: Secondary | ICD-10-CM | POA: Diagnosis not present

## 2023-05-13 DIAGNOSIS — Z7984 Long term (current) use of oral hypoglycemic drugs: Secondary | ICD-10-CM

## 2023-05-13 DIAGNOSIS — E1159 Type 2 diabetes mellitus with other circulatory complications: Secondary | ICD-10-CM | POA: Diagnosis not present

## 2023-05-13 DIAGNOSIS — I152 Hypertension secondary to endocrine disorders: Secondary | ICD-10-CM

## 2023-05-13 DIAGNOSIS — Z6841 Body Mass Index (BMI) 40.0 and over, adult: Secondary | ICD-10-CM

## 2023-05-13 MED ORDER — FLUOXETINE HCL 20 MG PO CAPS
20.0000 mg | ORAL_CAPSULE | Freq: Every day | ORAL | 3 refills | Status: DC
Start: 2023-05-13 — End: 2023-09-09

## 2023-05-13 MED ORDER — BUPROPION HCL ER (XL) 150 MG PO TB24
150.0000 mg | ORAL_TABLET | Freq: Every day | ORAL | 3 refills | Status: DC
Start: 2023-05-13 — End: 2023-09-09

## 2023-05-13 MED ORDER — AMLODIPINE BESYLATE 10 MG PO TABS: 10.0000 mg | ORAL_TABLET | Freq: Every day | ORAL | 3 refills | Status: DC

## 2023-05-13 NOTE — Progress Notes (Unsigned)
Established patient visit   Patient: Leah Bright   DOB: 1991/06/26   32 y.o. Female  MRN: [id number] Visit Date: 05/13/2023  Today's healthcare provider: Jacky Kindle, FNP  Introduced to nurse practitioner role and practice setting.  All questions answered.  Discussed provider/patient relationship and expectations.  Subjective    HPI HPI     Medical Management of Chronic Issues    Additional comments: Medication refills on amlodipine, Pioglitazone, buspirone      Last edited by Rolly Salter, CMA on 05/13/2023 11:11 AM.      Medications: Outpatient Medications Prior to Visit  Medication Sig   albuterol (VENTOLIN HFA) 108 (90 Base) MCG/ACT inhaler Inhale 2 puffs into the lungs every 6 (six) hours as needed for wheezing or shortness of breath.   busPIRone (BUSPAR) 10 MG tablet Take 1 tablet (10 mg total) by mouth 2 (two) times daily.   cetirizine (ZYRTEC) 10 MG tablet Take 10 mg by mouth daily.   fluticasone (VERAMYST) 27.5 MCG/SPRAY nasal spray Place 2 sprays into the nose daily.   ondansetron (ZOFRAN-ODT) 4 MG disintegrating tablet Take 1 tablet (4 mg total) by mouth every 8 (eight) hours as needed for nausea or vomiting.   [DISCONTINUED] amLODipine (NORVASC) 10 MG tablet Take 10 mg by mouth daily.   [DISCONTINUED] levothyroxine (SYNTHROID) 150 MCG tablet Take 150 mcg by mouth daily before breakfast.   [DISCONTINUED] metFORMIN (GLUCOPHAGE) 500 MG tablet Take 1,000 mg by mouth 2 (two) times daily with a meal.   [DISCONTINUED] pioglitazone (ACTOS) 45 MG tablet Take 45 mg by mouth daily.   [DISCONTINUED] benzonatate (TESSALON) 200 MG capsule Take 1 capsule (200 mg total) by mouth 3 (three) times daily as needed for cough. Take 1-2 tabs TID prn cough (Patient not taking: Reported on 05/13/2023)   [DISCONTINUED] brompheniramine-pseudoephedrine-DM 30-2-10 MG/5ML syrup Take 5 mLs by mouth 4 (four) times daily as needed. (Patient not taking: Reported on 05/13/2023)   No  facility-administered medications prior to visit.     Objective    BP 125/84 (BP Location: Right Arm, Patient Position: Sitting, Cuff Size: Large)   Pulse 92   Ht 5\' 5"  (1.651 m)   Wt 298 lb 9.6 oz (135.4 kg)   SpO2 100%   BMI 49.69 kg/m   Physical Exam Vitals and nursing note reviewed.  Constitutional:      General: She is not in acute distress.    Appearance: Normal appearance. She is obese. She is not ill-appearing, toxic-appearing or diaphoretic.  HENT:     Head: Normocephalic and atraumatic.  Cardiovascular:     Rate and Rhythm: Normal rate and regular rhythm.     Pulses: Normal pulses.     Heart sounds: Normal heart sounds. No murmur heard.    No friction rub. No gallop.  Pulmonary:     Effort: Pulmonary effort is normal. No respiratory distress.     Breath sounds: Normal breath sounds. No stridor. No wheezing, rhonchi or rales.  Chest:     Chest wall: No tenderness.  Musculoskeletal:        General: No swelling, tenderness, deformity or signs of injury. Normal range of motion.     Right lower leg: No edema.     Left lower leg: No edema.  Skin:    General: Skin is warm and dry.     Capillary Refill: Capillary refill takes less than 2 seconds.     Coloration: Skin is not jaundiced or pale.  Findings: No bruising, erythema, lesion or rash.  Neurological:     General: No focal deficit present.     Mental Status: She is alert and oriented to person, place, and time. Mental status is at baseline.     Cranial Nerves: No cranial nerve deficit.     Sensory: No sensory deficit.     Motor: No weakness.     Coordination: Coordination normal.  Psychiatric:        Mood and Affect: Mood is depressed.        Behavior: Behavior normal.        Thought Content: Thought content normal.        Judgment: Judgment normal.     Results for orders placed or performed in visit on 05/13/23  Urine microalbumin-creatinine with uACR  Result Value Ref Range   Creatinine, Urine 199.0  Not Estab. mg/dL   Microalbumin, Urine 16.1 Not Estab. ug/mL   Microalb/Creat Ratio 49 (H) 0 - 29 mg/g creat  CBC with Differential/Platelet  Result Value Ref Range   WBC 7.5 3.4 - 10.8 x10E3/uL   RBC 4.87 3.77 - 5.28 x10E6/uL   Hemoglobin 13.7 11.1 - 15.9 g/dL   Hematocrit 09.6 04.5 - 46.6 %   MCV 85 79 - 97 fL   MCH 28.1 26.6 - 33.0 pg   MCHC 33.1 31.5 - 35.7 g/dL   RDW 40.9 81.1 - 91.4 %   Platelets 296 150 - 450 x10E3/uL   Neutrophils 59 Not Estab. %   Lymphs 33 Not Estab. %   Monocytes 5 Not Estab. %   Eos 2 Not Estab. %   Basos 1 Not Estab. %   Neutrophils Absolute 4.4 1.4 - 7.0 x10E3/uL   Lymphocytes Absolute 2.5 0.7 - 3.1 x10E3/uL   Monocytes Absolute 0.3 0.1 - 0.9 x10E3/uL   EOS (ABSOLUTE) 0.2 0.0 - 0.4 x10E3/uL   Basophils Absolute 0.1 0.0 - 0.2 x10E3/uL   Immature Granulocytes 0 Not Estab. %   Immature Grans (Abs) 0.0 0.0 - 0.1 x10E3/uL  Comprehensive Metabolic Panel (CMET)  Result Value Ref Range   Glucose 187 (H) 70 - 99 mg/dL   BUN 13 6 - 20 mg/dL   Creatinine, Ser 7.82 0.57 - 1.00 mg/dL   eGFR 956 >21 HY/QMV/7.84   BUN/Creatinine Ratio 19 9 - 23   Sodium 139 134 - 144 mmol/L   Potassium 4.2 3.5 - 5.2 mmol/L   Chloride 102 96 - 106 mmol/L   CO2 22 20 - 29 mmol/L   Calcium 9.5 8.7 - 10.2 mg/dL   Total Protein 6.2 6.0 - 8.5 g/dL   Albumin 4.0 3.9 - 4.9 g/dL   Globulin, Total 2.2 1.5 - 4.5 g/dL   Bilirubin Total 0.5 0.0 - 1.2 mg/dL   Alkaline Phosphatase 102 44 - 121 IU/L   AST 16 0 - 40 IU/L   ALT 19 0 - 32 IU/L  TSH  Result Value Ref Range   TSH 6.740 (H) 0.450 - 4.500 uIU/mL  Hemoglobin A1c  Result Value Ref Range   Hgb A1c MFr Bld 10.0 (H) 4.8 - 5.6 %   Est. average glucose Bld gHb Est-mCnc 240 mg/dL  Lipid panel  Result Value Ref Range   Cholesterol, Total 204 (H) 100 - 199 mg/dL   Triglycerides 696 0 - 149 mg/dL   HDL 54 >29 mg/dL   VLDL Cholesterol Cal 25 5 - 40 mg/dL   LDL Chol Calc (NIH) 528 (H) 0 - 99 mg/dL  Chol/HDL Ratio 3.8 0.0 - 4.4  ratio    Assessment & Plan     Problem List Items Addressed This Visit       Cardiovascular and Mediastinum   Hypertension associated with diabetes (HCC)    Chronic, stable Continue on 10 mg Norvasc daily      Relevant Medications   amLODipine (NORVASC) 10 MG tablet   Other Relevant Orders   Urine microalbumin-creatinine with uACR (Completed)   CBC with Differential/Platelet (Completed)   Comprehensive Metabolic Panel (CMET) (Completed)   TSH (Completed)   Hemoglobin A1c (Completed)   Lipid panel (Completed)     Endocrine   Uncontrolled type 2 diabetes mellitus with hyperglycemia (HCC)    Chronic, historically uncontrolled Repeat A1c Pt reports non adherence to metformin; previously taking only once daily Also reports poor adherence to actos Recommend BID dosing of metformin; restart of actos and Continue to recommend balanced, lower carb meals. Smaller meal size, adding snacks. Choosing water as drink of choice and increasing purposeful exercise.       Relevant Medications   amLODipine (NORVASC) 10 MG tablet   Other Relevant Orders   Urine microalbumin-creatinine with uACR (Completed)   CBC with Differential/Platelet (Completed)   Comprehensive Metabolic Panel (CMET) (Completed)   TSH (Completed)   Hemoglobin A1c (Completed)   Lipid panel (Completed)     Other   Class 3 severe obesity due to excess calories with serious comorbidity and body mass index (BMI) of 45.0 to 49.9 in adult [facility])    Chronic, stable Body mass index is 49.69 kg/m. Discussed importance of healthy weight management Discussed diet and exercise       Relevant Medications   amLODipine (NORVASC) 10 MG tablet   Other Relevant Orders   Urine microalbumin-creatinine with uACR (Completed)   CBC with Differential/Platelet (Completed)   Comprehensive Metabolic Panel (CMET) (Completed)   TSH (Completed)   Hemoglobin A1c (Completed)   Lipid panel (Completed)   Mood disorder (HCC) - Primary     Chronic depression and anxiety; strong religious background Continues on buspar as needed; however, reports some vague side effects Recommend start of wellbutrin and prozac to further assist 6 week f/u recommended       Relevant Medications   amLODipine (NORVASC) 10 MG tablet   Other Relevant Orders   Urine microalbumin-creatinine with uACR (Completed)   CBC with Differential/Platelet (Completed)   Comprehensive Metabolic Panel (CMET) (Completed)   TSH (Completed)   Hemoglobin A1c (Completed)   Lipid panel (Completed)   No follow-ups on file.     Leilani Merl, FNP, have reviewed all documentation for this visit. The documentation on 05/14/23 for the exam, diagnosis, procedures, and orders are all accurate and complete.  Jacky Kindle, FNP  [facility] [phone] (phone) [phone] (fax)  [facility]

## 2023-05-14 ENCOUNTER — Other Ambulatory Visit: Payer: Self-pay | Admitting: Family Medicine

## 2023-05-14 ENCOUNTER — Encounter: Payer: Self-pay | Admitting: Family Medicine

## 2023-05-14 LAB — CBC WITH DIFFERENTIAL/PLATELET
Basophils Absolute: 0.1 10*3/uL (ref 0.0–0.2)
Basos: 1 %
EOS (ABSOLUTE): 0.2 10*3/uL (ref 0.0–0.4)
Eos: 2 %
Hematocrit: 41.4 % (ref 34.0–46.6)
Hemoglobin: 13.7 g/dL (ref 11.1–15.9)
Immature Grans (Abs): 0 10*3/uL (ref 0.0–0.1)
Immature Granulocytes: 0 %
Lymphocytes Absolute: 2.5 10*3/uL (ref 0.7–3.1)
Lymphs: 33 %
MCH: 28.1 pg (ref 26.6–33.0)
MCHC: 33.1 g/dL (ref 31.5–35.7)
MCV: 85 fL (ref 79–97)
Monocytes Absolute: 0.3 10*3/uL (ref 0.1–0.9)
Monocytes: 5 %
Neutrophils Absolute: 4.4 10*3/uL (ref 1.4–7.0)
Neutrophils: 59 %
Platelets: 296 10*3/uL (ref 150–450)
RBC: 4.87 x10E6/uL (ref 3.77–5.28)
RDW: 13.2 % (ref 11.7–15.4)
WBC: 7.5 10*3/uL (ref 3.4–10.8)

## 2023-05-14 LAB — COMPREHENSIVE METABOLIC PANEL
ALT: 19 IU/L (ref 0–32)
AST: 16 IU/L (ref 0–40)
Albumin: 4 g/dL (ref 3.9–4.9)
Alkaline Phosphatase: 102 IU/L (ref 44–121)
BUN/Creatinine Ratio: 19 (ref 9–23)
BUN: 13 mg/dL (ref 6–20)
Bilirubin Total: 0.5 mg/dL (ref 0.0–1.2)
CO2: 22 mmol/L (ref 20–29)
Calcium: 9.5 mg/dL (ref 8.7–10.2)
Chloride: 102 mmol/L (ref 96–106)
Creatinine, Ser: 0.68 mg/dL (ref 0.57–1.00)
Globulin, Total: 2.2 g/dL (ref 1.5–4.5)
Glucose: 187 mg/dL — ABNORMAL HIGH (ref 70–99)
Potassium: 4.2 mmol/L (ref 3.5–5.2)
Sodium: 139 mmol/L (ref 134–144)
Total Protein: 6.2 g/dL (ref 6.0–8.5)
eGFR: 117 mL/min/{1.73_m2} (ref >59)

## 2023-05-14 LAB — LIPID PANEL
Chol/HDL Ratio: 3.8 ratio (ref 0.0–4.4)
Cholesterol, Total: 204 mg/dL — ABNORMAL HIGH (ref 100–199)
HDL: 54 mg/dL (ref >39)
LDL Chol Calc (NIH): 125 mg/dL — ABNORMAL HIGH (ref 0–99)
Triglycerides: 143 mg/dL (ref 0–149)
VLDL Cholesterol Cal: 25 mg/dL (ref 5–40)

## 2023-05-14 LAB — MICROALBUMIN / CREATININE URINE RATIO
Creatinine, Urine: 199 mg/dL
Microalb/Creat Ratio: 49 mg/g{creat} — ABNORMAL HIGH (ref 0–29)
Microalbumin, Urine: 97.1 ug/mL

## 2023-05-14 LAB — HEMOGLOBIN A1C
Est. average glucose Bld gHb Est-mCnc: 240 mg/dL
Hgb A1c MFr Bld: 10 % — ABNORMAL HIGH (ref 4.8–5.6)

## 2023-05-14 LAB — TSH: TSH: 6.74 u[IU]/mL — ABNORMAL HIGH (ref 0.450–4.500)

## 2023-05-14 MED ORDER — ROSUVASTATIN CALCIUM 20 MG PO TABS: 20.0000 mg | ORAL_TABLET | Freq: Every day | ORAL | 3 refills | Status: DC

## 2023-05-14 MED ORDER — PIOGLITAZONE HCL 45 MG PO TABS: 45.0000 mg | ORAL_TABLET | Freq: Every day | ORAL | 3 refills | Status: DC

## 2023-05-14 MED ORDER — LEVOTHYROXINE SODIUM 175 MCG PO TABS: 175.0000 ug | ORAL_TABLET | Freq: Every day | ORAL | 0 refills | Status: DC

## 2023-05-14 MED ORDER — LISINOPRIL 5 MG PO TABS
5.0000 mg | ORAL_TABLET | Freq: Every day | ORAL | 3 refills | Status: DC
Start: 2023-05-14 — End: 2024-06-27

## 2023-05-14 MED ORDER — METFORMIN HCL ER 500 MG PO TB24: 1000.0000 mg | ORAL_TABLET | Freq: Two times a day (BID) | ORAL | 3 refills | Status: DC

## 2023-05-14 NOTE — Assessment & Plan Note (Signed)
Chronic, historically uncontrolled Repeat A1c Pt reports non adherence to metformin; previously taking only once daily Also reports poor adherence to actos Recommend BID dosing of metformin; restart of actos and Continue to recommend balanced, lower carb meals. Smaller meal size, adding snacks. Choosing water as drink of choice and increasing purposeful exercise.

## 2023-05-14 NOTE — Assessment & Plan Note (Signed)
Chronic depression and anxiety; strong religious background Continues on buspar as needed; however, reports some vague side effects Recommend start of wellbutrin and prozac to further assist 6 week f/u recommended

## 2023-05-14 NOTE — Assessment & Plan Note (Signed)
Chronic, stable Body mass index is 49.69 kg/m. Discussed importance of healthy weight management Discussed diet and exercise

## 2023-05-14 NOTE — Assessment & Plan Note (Signed)
Chronic, stable Continue on 10 mg Norvasc daily

## 2023-05-21 ENCOUNTER — Encounter: Payer: Self-pay | Admitting: Family Medicine

## 2023-05-22 ENCOUNTER — Other Ambulatory Visit: Payer: Self-pay | Admitting: Family Medicine

## 2023-06-17 ENCOUNTER — Ambulatory Visit: Payer: Managed Care, Other (non HMO) | Admitting: Family Medicine

## 2023-06-17 ENCOUNTER — Encounter: Payer: Self-pay | Admitting: Family Medicine

## 2023-06-17 ENCOUNTER — Other Ambulatory Visit: Payer: Self-pay | Admitting: Family Medicine

## 2023-06-17 VITALS — BP 137/84 | HR 81 | Ht 67.0 in | Wt 308.0 lb

## 2023-06-17 DIAGNOSIS — Z91199 Patient's noncompliance with other medical treatment and regimen due to unspecified reason: Secondary | ICD-10-CM

## 2023-06-17 DIAGNOSIS — F39 Unspecified mood [affective] disorder: Secondary | ICD-10-CM

## 2023-06-17 DIAGNOSIS — E66813 Obesity, class 3: Secondary | ICD-10-CM | POA: Diagnosis not present

## 2023-06-17 DIAGNOSIS — W275XXA Contact with paper-cutter, initial encounter: Secondary | ICD-10-CM | POA: Insufficient documentation

## 2023-06-17 DIAGNOSIS — Z3169 Encounter for other general counseling and advice on procreation: Secondary | ICD-10-CM

## 2023-06-17 DIAGNOSIS — N270 Small kidney, unilateral: Secondary | ICD-10-CM

## 2023-06-17 DIAGNOSIS — E1159 Type 2 diabetes mellitus with other circulatory complications: Secondary | ICD-10-CM | POA: Diagnosis not present

## 2023-06-17 DIAGNOSIS — S63612D Unspecified sprain of right middle finger, subsequent encounter: Secondary | ICD-10-CM

## 2023-06-17 DIAGNOSIS — W275XXS Contact with paper-cutter, sequela: Secondary | ICD-10-CM

## 2023-06-17 DIAGNOSIS — I152 Hypertension secondary to endocrine disorders: Secondary | ICD-10-CM

## 2023-06-17 DIAGNOSIS — S63612S Unspecified sprain of right middle finger, sequela: Secondary | ICD-10-CM

## 2023-06-17 DIAGNOSIS — S63612A Unspecified sprain of right middle finger, initial encounter: Secondary | ICD-10-CM | POA: Insufficient documentation

## 2023-06-17 DIAGNOSIS — Z556 Problems related to health literacy: Secondary | ICD-10-CM | POA: Insufficient documentation

## 2023-06-17 DIAGNOSIS — E1165 Type 2 diabetes mellitus with hyperglycemia: Secondary | ICD-10-CM

## 2023-06-17 DIAGNOSIS — Z6841 Body Mass Index (BMI) 40.0 and over, adult: Secondary | ICD-10-CM

## 2023-06-17 DIAGNOSIS — W275XXD Contact with paper-cutter, subsequent encounter: Secondary | ICD-10-CM
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
## 2023-06-17 NOTE — Assessment & Plan Note (Signed)
Chronic, stable Previously stable labs Continue to monitor IMPRESSION: 1. No evidence of renal artery stenosis. 2. 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 3. No CT evidence of FMD. 4. Bilateral presumably physiologic adnexal cysts.     Electronically Signed   By: Simonne Come M.D.   On: 10/04/2019 16:33

## 2023-06-17 NOTE — Assessment & Plan Note (Signed)
10/16 Pt presents with R finger in splint; noting concern for a "hairline fracture" as she cannot move her finger, it was black and blue and she spoke with her family member who is in healthcare Denies any imaging or referral to hand specialist today d/t cost

## 2023-06-17 NOTE — Assessment & Plan Note (Signed)
R 4th finger injury; at PIP Reports discoloration and pain and swelling Declines assessment of site and redresses with bandaid to continue H2O2 baths at home and monitoring

## 2023-06-17 NOTE — Assessment & Plan Note (Signed)
Patient reports TTC for years; and has self removed herself from one of her antihypertensives to assist this process Wishes to speak with OBGYN to assist Denies pregnancy at any time or previous treatment options

## 2023-06-17 NOTE — Assessment & Plan Note (Signed)
Chronic, uncontrolled Patient remains non adherent to medical instructions Has self discontinued lisinopril for blood pressure and proteinuria control due to reports of trying to conceive and reports of RAS; imaging confirms pt does NOT have RAS.  Goal BP of 129/79   IMPRESSION: 1. No evidence of renal artery stenosis. 2. 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 3. No CT evidence of FMD. 4. Bilateral presumably physiologic adnexal cysts.     Electronically Signed   By: Simonne Come M.D.   On: 10/04/2019 16:33

## 2023-06-17 NOTE — Assessment & Plan Note (Signed)
Chronic, worsening with acute 10# weight gain in <1 month Body mass index is 48.24 kg/m. Discussed importance of healthy weight management Discussed diet and exercise
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
## 2023-06-17 NOTE — Assessment & Plan Note (Signed)
Chronic, previously uncontrolled with medication non adherence Pt is up 10# in <4 weeks due to "eating her feelings" due to stress A1c goal <7% Continues to decline use of injection based insulin vs GLP to assist Previously prescribed 1000 mg Metformin BID as well as Actos 45 mg daily Continue to recommend balanced, lower carb meals. Smaller meal size, adding snacks. Choosing water as drink of choice and increasing purposeful exercise.

## 2023-06-29 ENCOUNTER — Ambulatory Visit: Payer: Self-pay | Admitting: *Deleted

## 2023-06-29 ENCOUNTER — Telehealth: Payer: Managed Care, Other (non HMO) | Admitting: Family Medicine

## 2023-06-29 DIAGNOSIS — E1165 Type 2 diabetes mellitus with hyperglycemia: Secondary | ICD-10-CM | POA: Diagnosis not present

## 2023-06-29 DIAGNOSIS — Z7984 Long term (current) use of oral hypoglycemic drugs: Secondary | ICD-10-CM

## 2023-06-29 DIAGNOSIS — I152 Hypertension secondary to endocrine disorders: Secondary | ICD-10-CM

## 2023-06-29 DIAGNOSIS — B850 Pediculosis due to Pediculus humanus capitis: Secondary | ICD-10-CM

## 2023-06-29 DIAGNOSIS — Z6841 Body Mass Index (BMI) 40.0 and over, adult: Secondary | ICD-10-CM

## 2023-06-29 DIAGNOSIS — E66813 Obesity, class 3: Secondary | ICD-10-CM

## 2023-06-29 DIAGNOSIS — F39 Unspecified mood [affective] disorder: Secondary | ICD-10-CM | POA: Diagnosis not present

## 2023-06-29 DIAGNOSIS — E1159 Type 2 diabetes mellitus with other circulatory complications: Secondary | ICD-10-CM

## 2023-06-29 MED ORDER — PERMETHRIN 5 % EX CREA: 1.0000 | TOPICAL_CREAM | Freq: Once | CUTANEOUS | 0 refills | Status: AC

## 2023-06-29 NOTE — Telephone Encounter (Signed)
Summary: poss headlice   Pt called she has an appt this morning at 1040 at [facility].  She sad her daughter has head lice and she thinks she may have it to.  She is wanting to know what to do as whether to treat before she comes.  She wants some one to look also because she is not sure.  CB@  [phone]         Reason for Disposition  [1] New-onset head lice AND [2] usually respond to over-the-counter medicines (e.g., Nix) in community  Answer Assessment - Initial Assessment Questions 1. LICE APPEARANCE: "Have you seen any lice?" If Yes, ask: "What do they look like?" (Correct answer: A gray bug that's 1/16 inch or 2 millimeters long)      One chid with lice- patient has not seen her head 2. NITS APPEARANCE: "Have you seen any eggs (nits) in the hair?" If Yes, ask: "What do they look like?" (Correct answer: white or tan eggs attached to hair shafts)     no 3. ONSET: "How long have the eggs been present?"      Not seen 4. ITCH: "Is the scalp itchy?" If Yes, ask: "How bad is the itch?"      yes 5. RASH: "Is there a rash?" If Yes, ask: "What does it look like?"      no 6. TREATMENT:  "What treatment have you tried?" "What happened?"     Just received lice treatment kit  Protocols used: Head Lice-A-AH

## 2023-06-29 NOTE — Telephone Encounter (Signed)
  Chief Complaint: lice exposure Symptoms: patient is not sure if she has lice- but her daughter has them, patient also has appointment in office today- that has been changed to VV/MyChart Frequency: today  Disposition: [] ED /[] Urgent Care (no appt availability in office) / [] Appointment(In office/virtual)/ []  [facility]/ [x] Home Care/ [] Refused Recommended Disposition /[] [facility]/ []  Follow-up with PCP Additional Notes: Patient has exposure to lice- she has appointment in office today- she is not sure if she has lice- but she is itching. Dur to home exposure- appointment has been changed to virtual and advised to treat her self as positive. Patient also advised to discuss with her PCP at visit today.

## 2023-07-08 ENCOUNTER — Ambulatory Visit: Payer: Managed Care, Other (non HMO) | Admitting: Family Medicine

## 2023-07-08 VITALS — BP 160/98 | HR 84 | Temp 98.7°F | Resp 16 | Ht 67.0 in | Wt 308.8 lb

## 2023-07-08 DIAGNOSIS — I152 Hypertension secondary to endocrine disorders: Secondary | ICD-10-CM

## 2023-07-08 DIAGNOSIS — K529 Noninfective gastroenteritis and colitis, unspecified: Secondary | ICD-10-CM

## 2023-07-08 DIAGNOSIS — E1159 Type 2 diabetes mellitus with other circulatory complications: Secondary | ICD-10-CM

## 2023-07-08 DIAGNOSIS — R0989 Other specified symptoms and signs involving the circulatory and respiratory systems: Secondary | ICD-10-CM | POA: Diagnosis not present

## 2023-07-08 DIAGNOSIS — J060 Acute laryngopharyngitis: Secondary | ICD-10-CM

## 2023-07-08 LAB — POC SOFIA 2 FLU + SARS ANTIGEN FIA
Influenza A, POC: NEGATIVE
Influenza B, POC: NEGATIVE
SARS Coronavirus 2 Ag: NEGATIVE

## 2023-07-08 MED ORDER — PREDNISONE 10 MG (21) PO TBPK
ORAL_TABLET | ORAL | 0 refills | Status: DC
Start: 2023-07-08 — End: 2023-09-09

## 2023-07-08 MED ORDER — LOPERAMIDE HCL 2 MG PO CAPS: 2.0000 mg | ORAL_CAPSULE | ORAL | 0 refills | Status: DC | PRN

## 2023-07-08 MED ORDER — BENZONATATE 100 MG PO CAPS
100.0000 mg | ORAL_CAPSULE | Freq: Two times a day (BID) | ORAL | 0 refills | Status: DC | PRN
Start: 2023-07-08 — End: 2024-06-27

## 2023-07-08 NOTE — Patient Instructions (Addendum)
You can take Tylenol and/or Ibuprofen as needed for fever reduction and pain relief.   For cough: honey 1/2 to 1 teaspoon (you can dilute the honey in water or another fluid).  You can also use guaifenesin and dextromethorphan for cough. You can use a humidifier for chest congestion and cough.  If you don't have a humidifier, you can sit in the bathroom with the hot shower running.      For sore throat: try warm salt water gargles, cepacol lozenges, throat spray, warm tea or water with lemon/honey, popsicles or ice, or OTC cold relief medicine for throat discomfort.   For congestion:   - take a daily anti-histamine like Zyrtec (cetirizine), Claritin (loratadine), or Allegra (fexofenadine),   - You can use Flonase 1-2 sprays in each nostril daily.  - do NOT take pseudoephedrine (an oral decongestant) due to your high blood pressure.    It is important to stay hydrated: drink plenty of fluids (water, gatorade/powerade/pedialyte, juices, or teas) to keep your throat moisturized and help further relieve irritation/discomfort.  These should be sugar-free if you are diabetic.

## 2023-07-08 NOTE — Progress Notes (Signed)
Established patient visit   Patient: Leah Bright   DOB: Jul 15, 1991   32 y.o. Female  MRN: [id number] Visit Date: 07/08/2023  Today's healthcare provider: Sherlyn Hay, DO   Chief Complaint  Patient presents with   Acute Visit    Nausea, sore throat, diarrhea and cough as well as headache x 3 days, but felt worse yesterday. No at home covid test.    Subjective    HPI Daughter got sick two days after leaving to her mom's Couple kids out last week from the school -some sickness going aorund  Just started new job in preschool at [employer]  The patient, with a history of diabetes and kidney atrophy, presents with a three-day history of nausea, sore throat, diarrhea, cough, and headache. They report a recent loss of voice and extreme fatigue. They deny fever, chills, and body aches. The patient has been managing symptoms with Tylenol at home. They also report a recent exposure to a sick child, their daughter, who had been ill at her mother's house before returning home.  The patient describes a significant change in bowel habits, with green, watery to loose stools occurring four to six times a day. They deny taking any medication for diarrhea. They also report chest discomfort, particularly when coughing, and occasional chest tightness. They have been using albuterol at home for symptom management and report that their supply is running low.  The patient also reports difficulty swallowing, with pain localized in the upper chest area. They have been managing this with Tylenol, as they are unable to take ibuprofen due to kidney issues.  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 They report that several children at the high school were out sick the previous week. The patient has not traveled recently.    Medications: Outpatient Medications Prior to Visit  Medication Sig   albuterol  (VENTOLIN HFA) 108 (90 Base) MCG/ACT inhaler Inhale 2 puffs into the lungs every 6 (six) hours as needed for wheezing or shortness of breath.   amLODipine (NORVASC) 10 MG tablet Take 1 tablet (10 mg total) by mouth daily.   buPROPion (WELLBUTRIN XL) 150 MG 24 hr tablet Take 1 tablet (150 mg total) by mouth daily.   cetirizine (ZYRTEC) 10 MG tablet Take 10 mg by mouth daily.   FLUoxetine (PROZAC) 20 MG capsule Take 1 capsule (20 mg total) by mouth daily.   fluticasone (VERAMYST) 27.5 MCG/SPRAY nasal spray Place 2 sprays into the nose daily.   levothyroxine (SYNTHROID) 175 MCG tablet Take 1 tablet (175 mcg total) by mouth daily.   lisinopril (ZESTRIL) 5 MG tablet Take 1 tablet (5 mg total) by mouth daily.   metFORMIN (GLUCOPHAGE-XR) 500 MG 24 hr tablet Take 2 tablets (1,000 mg total) by mouth 2 (two) times daily with a meal.   ondansetron (ZOFRAN-ODT) 4 MG disintegrating tablet Take 1 tablet (4 mg total) by mouth every 8 (eight) hours as needed for nausea or vomiting.   pioglitazone (ACTOS) 45 MG tablet Take 1 tablet (45 mg total) by mouth daily.   rosuvastatin (CRESTOR) 20 MG tablet Take 1 tablet (20 mg total) by mouth daily.   No facility-administered medications prior to visit.    Review of Systems  Constitutional:  Positive for appetite change (not hungry). Negative for chills, fatigue and fever.  Respiratory:  Positive for cough. Negative for chest tightness and shortness of breath.   Cardiovascular:  Positive for chest pain (when she coughs). Negative for palpitations.  Gastrointestinal:  Positive for diarrhea (green diarrhea, occurring 4-6 times per day, varies between watery and just mildly loose) and nausea. Negative for abdominal pain and vomiting.  Skin:  Pallor: lopera.  Neurological:  Negative for dizziness and weakness.        Objective    BP (!) 160/98   Pulse 84   Temp 98.7 F (37.1 C) (Oral)   Resp 16   Ht 5\' 7"  (1.702 m)   Wt (!) 308 lb 12.8 oz (140.1 kg)   SpO2  99%   BMI 48.36 kg/m     Physical Exam Vitals reviewed.  Constitutional:      General: She is not in acute distress.    Appearance: Normal appearance. She is well-developed. She is not diaphoretic.  HENT:     Head: Normocephalic and atraumatic.     Right Ear: Tympanic membrane, ear canal and external ear normal.     Left Ear: Tympanic membrane, ear canal and external ear normal.     Nose: Nose normal.     Mouth/Throat:     Mouth: Mucous membranes are moist.     Pharynx: Oropharynx is clear. No oropharyngeal exudate.  Eyes:     General: No scleral icterus.    Conjunctiva/sclera: Conjunctivae normal.     Pupils: Pupils are equal, round, and reactive to light.  Cardiovascular:     Rate and Rhythm: Normal rate and regular rhythm.     Pulses: Normal pulses.     Heart sounds: Normal heart sounds. No murmur heard. Pulmonary:     Effort: Pulmonary effort is normal. No respiratory distress.     Breath sounds: Normal breath sounds. No wheezing or rales.  Musculoskeletal:     Cervical back: Neck supple.     Right lower leg: No edema.     Left lower leg: No edema.  Lymphadenopathy:     Cervical: No cervical adenopathy.  Skin:    General: Skin is warm and dry.     Findings: No rash.  Neurological:     Mental Status: She is alert.      Results for orders placed or performed in visit on 07/08/23  POC SOFIA 2 FLU + SARS ANTIGEN FIA  Result Value Ref Range   Influenza A, POC Negative Negative   Influenza B, POC Negative Negative   SARS Coronavirus 2 Ag Negative Negative    Assessment & Plan    Acute laryngopharyngitis -     Benzonatate; Take 1 capsule (100 mg total) by mouth 2 (two) times daily as needed for cough.  Dispense: 20 capsule; Refill: 0 -     predniSONE; 6 tabs day 1, 5 tabs day 2, 4 tabs day 3, 3 tabs day 4, 2 tabs day 5, 1 tab day 6  Dispense: 1 each; Refill: 0  Upper respiratory symptom -     POC SOFIA 2 FLU + SARS ANTIGEN FIA  Gastroenteritis -      Loperamide HCl; Take 1 capsule (2 mg total) by mouth as needed for diarrhea or loose stools. Take 4 mg (2 capsules) orally followed by 2 mg (1 capsule) after each unformed stool; MAX 16 mg (8 capsules) per day  Dispense: 30 capsule; Refill: 0   Upper Respiratory Infection/laryngopharyngitis Symptoms include sore throat, cough, headache, fatigue, and voice loss for three days. No fever or body aches. COVID and flu tests negative. -Continue supportive treatment with rest, hydration, and  small meals throughout the day. -Start prednisone taper pack to reduce inflammation. -Use Tessalon Perles for cough. -Consider over-the-counter dextromethorphan for cough. -Consider warm salt water gargles, sugar-free lozenges, and throat sprays for sore throat. -Return to work on Monday if feeling better.  Gastroenteritis: Green, watery to loose stools 4-6 times a day. No recent antibiotics or hospitalization. No recent travel. 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 -Start loperamide for diarrhea. -Ensure adequate hydration, including electrolytes.   Return if symptoms worsen or fail to improve.      I discussed the assessment and treatment plan with the patient  The patient was provided an opportunity to ask questions and all were answered. The patient agreed with the plan and demonstrated an understanding of the instructions.   The patient was advised to call back or seek an in-person evaluation if the symptoms worsen or if the condition fails to improve as anticipated.  Total time was 30 minutes. That includes chart review before the visit, the actual patient visit, and time spent on documentation after the visit.     Sherlyn Hay, DO  [facility] [phone] (phone) [phone] (fax)  [facility]

## 2023-07-09 ENCOUNTER — Encounter: Payer: Self-pay | Admitting: Family Medicine

## 2023-07-09 NOTE — Assessment & Plan Note (Signed)
Chronic, worsening PHQ d/t stress at patient's church Pt notes concern for mistreatment of the elders from other parishioners and as a result patient feels as if 'she's lost a parent' Declines changes in medication at this time Continue wellbutrin 150 and prozac 20 mg Pt declines to use PRN buspar d/t side effects Defer use of benzo medication given known medication non adherence   Patient reports acute stress from her step daughter's mother and a recent panic attack regarding a job interview She continues to have poor adjustment reaction to stress and poor self esteem as a result of chronically strained relationships with authority figures

## 2023-07-09 NOTE — Assessment & Plan Note (Signed)
Chronic, uncontrolled Patient remains non adherent to medical instructions Has self discontinued lisinopril for blood pressure and proteinuria control due to reports of trying to conceive and reports of RAS; imaging confirms pt does NOT have RAS.  Goal BP of 129/79   IMPRESSION: 1. No evidence of renal artery stenosis. 2. 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 3. No CT evidence of FMD. 4. Bilateral presumably physiologic adnexal cysts.     Electronically Signed   By: Simonne Come M.D.   On: 10/04/2019 16:33

## 2023-07-09 NOTE — Assessment & Plan Note (Signed)
Chronic; last BMI of 48 Many co morbid conditions Discussed importance of healthy weight management Discussed diet and exercise

## 2023-07-09 NOTE — Assessment & Plan Note (Signed)
Chronic, previously uncontrolled with medication non adherence Pt is up 10# in <4 weeks due to "eating her feelings" due to stress A1c goal <7% Continues to decline use of injection based insulin vs GLP to assist Previously prescribed 1000 mg Metformin BID as well as Actos 45 mg daily Continue to recommend balanced, lower carb meals. Smaller meal size, adding snacks. Choosing water as drink of choice and increasing purposeful exercise.

## 2023-07-09 NOTE — Assessment & Plan Note (Signed)
Acute, self limiting Recommend treatment for all involved/exposes as well as environmental surveillance to assist

## 2023-07-20 NOTE — Assessment & Plan Note (Addendum)
Elevated blood pressure noted during visit, possibly due to discomfort. -Continue current management (amlodipine 10 mg daily and lisinopril 5 mg daily) and reassess when no longer sick.  Diabetes: Continue metformin XR 1000 mg 2 times daily Continue pioglitazone 45 mg daily Continue rosuvastatin 20 mg daily

## 2023-08-07 ENCOUNTER — Other Ambulatory Visit: Payer: Self-pay | Admitting: Family Medicine

## 2023-08-18 ENCOUNTER — Ambulatory Visit: Payer: Managed Care, Other (non HMO) | Admitting: Family Medicine

## 2023-08-27 ENCOUNTER — Ambulatory Visit: Payer: Managed Care, Other (non HMO) | Admitting: Family Medicine

## 2023-09-09 ENCOUNTER — Emergency Department: Payer: Managed Care, Other (non HMO)

## 2023-09-09 ENCOUNTER — Ambulatory Visit
Admission: RE | Admit: 2023-09-09 | Discharge: 2023-09-09 | Disposition: A | Discharge status: Home / Self Care | Payer: Managed Care, Other (non HMO) | Source: Home / Self Care | Attending: Family Medicine | Admitting: Family Medicine

## 2023-09-09 ENCOUNTER — Telehealth: Payer: Self-pay

## 2023-09-09 ENCOUNTER — Ambulatory Visit: Payer: Managed Care, Other (non HMO) | Admitting: Family Medicine

## 2023-09-09 ENCOUNTER — Ambulatory Visit
Admission: RE | Admit: 2023-09-09 | Discharge: 2023-09-09 | Disposition: A | Discharge status: Home / Self Care | Payer: Managed Care, Other (non HMO) | Source: Ambulatory Visit | Attending: Family Medicine | Admitting: Family Medicine

## 2023-09-09 ENCOUNTER — Ambulatory Visit: Payer: Self-pay

## 2023-09-09 ENCOUNTER — Emergency Department
Admission: EM | Admit: 2023-09-09 | Discharge: 2023-09-09 | Disposition: A | Discharge status: Home / Self Care | Payer: Managed Care, Other (non HMO) | Attending: Student in an Organized Health Care Education/Training Program | Admitting: Student in an Organized Health Care Education/Training Program

## 2023-09-09 ENCOUNTER — Other Ambulatory Visit: Payer: Self-pay

## 2023-09-09 VITALS — BP 142/96 | HR 99 | Temp 98.5°F | Resp 20 | Ht 67.0 in | Wt 310.0 lb

## 2023-09-09 DIAGNOSIS — R197 Diarrhea, unspecified: Secondary | ICD-10-CM

## 2023-09-09 DIAGNOSIS — R051 Acute cough: Secondary | ICD-10-CM | POA: Insufficient documentation

## 2023-09-09 DIAGNOSIS — Z20822 Contact with and (suspected) exposure to covid-19: Secondary | ICD-10-CM | POA: Insufficient documentation

## 2023-09-09 DIAGNOSIS — R1084 Generalized abdominal pain: Secondary | ICD-10-CM | POA: Insufficient documentation

## 2023-09-09 DIAGNOSIS — J040 Acute laryngitis: Secondary | ICD-10-CM

## 2023-09-09 DIAGNOSIS — R1010 Upper abdominal pain, unspecified: Secondary | ICD-10-CM | POA: Diagnosis not present

## 2023-09-09 DIAGNOSIS — R112 Nausea with vomiting, unspecified: Secondary | ICD-10-CM | POA: Diagnosis not present

## 2023-09-09 DIAGNOSIS — R052 Subacute cough: Secondary | ICD-10-CM | POA: Diagnosis not present

## 2023-09-09 DIAGNOSIS — N926 Irregular menstruation, unspecified: Secondary | ICD-10-CM

## 2023-09-09 DIAGNOSIS — J069 Acute upper respiratory infection, unspecified: Secondary | ICD-10-CM

## 2023-09-09 LAB — URINALYSIS, ROUTINE W REFLEX MICROSCOPIC
Bilirubin Urine: NEGATIVE
Glucose, UA: NEGATIVE mg/dL
Hgb urine dipstick: NEGATIVE
Ketones, ur: NEGATIVE mg/dL
Leukocytes,Ua: NEGATIVE
Nitrite: NEGATIVE
Protein, ur: 100 mg/dL — AB
Specific Gravity, Urine: 1.027 (ref 1.005–1.030)
pH: 5 (ref 5.0–8.0)

## 2023-09-09 LAB — COMPREHENSIVE METABOLIC PANEL
ALT: 32 U/L (ref 0–44)
AST: 30 U/L (ref 15–41)
Albumin: 4 g/dL (ref 3.5–5.0)
Alkaline Phosphatase: 78 U/L (ref 38–126)
Anion gap: 13 (ref 5–15)
BUN: 13 mg/dL (ref 6–20)
CO2: 21 mmol/L — ABNORMAL LOW (ref 22–32)
Calcium: 9.6 mg/dL (ref 8.9–10.3)
Chloride: 104 mmol/L (ref 98–111)
Creatinine, Ser: 0.62 mg/dL (ref 0.44–1.00)
GFR, Estimated: >60 mL/min (ref >60)
Glucose, Bld: 184 mg/dL — ABNORMAL HIGH (ref 70–99)
Potassium: 3.9 mmol/L (ref 3.5–5.1)
Sodium: 138 mmol/L (ref 135–145)
Total Bilirubin: 1 mg/dL (ref 0.0–1.2)
Total Protein: 7.4 g/dL (ref 6.5–8.1)

## 2023-09-09 LAB — LIPASE, BLOOD: Lipase: 26 U/L (ref 11–51)

## 2023-09-09 LAB — CBC
HCT: 43.3 % (ref 36.0–46.0)
Hemoglobin: 14.9 g/dL (ref 12.0–15.0)
MCH: 27.6 pg (ref 26.0–34.0)
MCHC: 34.4 g/dL (ref 30.0–36.0)
MCV: 80.2 fL (ref 80.0–100.0)
Platelets: 281 10*3/uL (ref 150–400)
RBC: 5.4 MIL/uL — ABNORMAL HIGH (ref 3.87–5.11)
RDW: 12.7 % (ref 11.5–15.5)
WBC: 6.7 10*3/uL (ref 4.0–10.5)
nRBC: 0 % (ref 0.0–0.2)

## 2023-09-09 LAB — RESP PANEL BY RT-PCR (RSV, FLU A&B, COVID)  RVPGX2
Influenza A by PCR: NEGATIVE
Influenza B by PCR: NEGATIVE
Resp Syncytial Virus by PCR: NEGATIVE
SARS Coronavirus 2 by RT PCR: NEGATIVE

## 2023-09-09 LAB — POC URINE PREG, ED: Preg Test, Ur: NEGATIVE

## 2023-09-09 MED ORDER — PREDNISONE 20 MG PO TABS
40.0000 mg | ORAL_TABLET | Freq: Once | ORAL | Status: AC
  Administered 2023-09-09: 40 mg via ORAL
  Filled 2023-09-09: qty 2

## 2023-09-09 MED ORDER — PREDNISONE 10 MG PO TABS: 10.0000 mg | ORAL_TABLET | Freq: Every day | ORAL | 0 refills | Status: DC

## 2023-09-09 MED ORDER — AZITHROMYCIN 500 MG PO TABS
500.0000 mg | ORAL_TABLET | Freq: Once | ORAL | Status: AC
Start: 2023-09-09 — End: 2023-09-09
  Administered 2023-09-09: 500 mg via ORAL
  Filled 2023-09-09: qty 1

## 2023-09-09 MED ORDER — ONDANSETRON 4 MG PO TBDP
4.0000 mg | ORAL_TABLET | Freq: Once | ORAL | Status: AC
  Administered 2023-09-09: 4 mg via ORAL
  Filled 2023-09-09: qty 1

## 2023-09-09 MED ORDER — HYDROCOD POLI-CHLORPHE POLI ER 10-8 MG/5ML PO SUER: 5.0000 mL | Freq: Two times a day (BID) | ORAL | 0 refills | Status: DC | PRN

## 2023-09-09 MED ORDER — ONDANSETRON 4 MG PO TBDP: 4.0000 mg | ORAL_TABLET | Freq: Three times a day (TID) | ORAL | 0 refills | Status: DC | PRN

## 2023-09-09 MED ORDER — IOHEXOL 350 MG/ML SOLN
125.0000 mL | Freq: Once | INTRAVENOUS | Status: AC | PRN
  Administered 2023-09-09: 125 mL via INTRAVENOUS

## 2023-09-09 MED ORDER — AZITHROMYCIN 250 MG PO TABS: ORAL_TABLET | ORAL | 0 refills | Status: AC

## 2023-09-09 NOTE — Telephone Encounter (Signed)
 Patient has gone to ER for further evaluation.

## 2023-09-09 NOTE — ED Provider Triage Note (Signed)
 Emergency Medicine Provider Triage Evaluation Note  Leah Bright , a 33 y.o. female  was evaluated in triage.  Pt complains of history of abdominal pain starting 3 weeks ago, nauseas, diarrhea, cough, hoarseness.  Denies fever.  Sick contacts at home  Review of Systems  Positive:  Negative:   Physical Exam  BP (!) 138/118 (BP Location: Left Wrist)   Pulse 92   Temp 98 F (36.7 C) (Oral)   Resp 17   Ht 5' 7 (1.702 m)   Wt (!) 140.6 kg   LMP 08/01/2023 (Exact Date)   SpO2 100%   BMI 48.55 kg/m  Gen:   Awake, no distress   Resp:  Normal effort no wheezing MSK:   Moves extremities without difficulty  Other:    Medical Decision Making  Medically screening exam initiated at 5:34 PM.  Appropriate orders placed.  Leah Bright was informed that the remainder of the evaluation will be completed by another provider, this initial triage assessment does not replace that evaluation, and the importance of remaining in the ED until their evaluation is complete.  Patient with respiratory and abdominal symptoms, ordered chest x-ray, respiratory panel, lab work   Janit Kast, PA-C 09/09/23 1737

## 2023-09-09 NOTE — ED Provider Notes (Signed)
 [facility] Provider Note    Event Date/Time   First MD Initiated Contact with Patient 09/09/23 1958     (approximate)   History   Abdominal Pain   HPI  Leah Bright is a 33 y.o. female who presents to the ER for evaluation of feeling unwell since Christmas.  She comes to the ER today as she was seen in outpatient clinic for abdominal pain generalized sharp and stabbing in nature.  Some right upper quadrant pain but also left lower quadrant pain.  Was sent to the hospital for CT imaging.  She denies any dysuria.  Has been having cough sore throat.  Was placed on cough suppressant no recent antibiotics.     Physical Exam   Triage Vital Signs: ED Triage Vitals [09/09/23 1731]  Encounter Vitals Group     BP (!) 138/118     Systolic BP Percentile      Diastolic BP Percentile      Pulse Rate 92     Resp 17     Temp 98 F (36.7 C)     Temp Source Oral     SpO2 100 %     Weight (!) 310 lb (140.6 kg)     Height 5' 7 (1.702 m)     Head Circumference      Peak Flow      Pain Score 7     Pain Loc      Pain Education      Exclude from Growth Chart     Most recent vital signs: Vitals:   09/09/23 1731 09/09/23 2005  BP: (!) 138/118   Pulse: 92   Resp: 17   Temp: 98 F (36.7 C)   SpO2: 100% 100%     Constitutional: Alert  Eyes: Conjunctivae are normal.  Head: Atraumatic. Nose: No congestion/rhinnorhea. Mouth/Throat: Mucous membranes are moist.  No tonsillar erythema or exudates.  Uvula midline. Neck: Painless ROM.  Cardiovascular:   Good peripheral circulation. Respiratory: Normal respiratory effort.  No retractions.  Bronchitic sounding cough. Gastrointestinal: Soft with mild generalized tenderness to palpation. Musculoskeletal:  no deformity Neurologic:  MAE spontaneously. No gross focal neurologic deficits are appreciated.  Skin:  Skin is warm, dry and intact. No rash noted. Psychiatric: Mood and affect are normal. Speech and  behavior are normal.    ED Results / Procedures / Treatments   Labs (all labs ordered are listed, but only abnormal results are displayed) Labs Reviewed  COMPREHENSIVE METABOLIC PANEL - Abnormal; Notable for the following components:      Result Value   CO2 21 (*)    Glucose, Bld 184 (*)    All other components within normal limits  CBC - Abnormal; Notable for the following components:   RBC 5.40 (*)    All other components within normal limits  URINALYSIS, ROUTINE W REFLEX MICROSCOPIC - Abnormal; Notable for the following components:   Color, Urine YELLOW (*)    APPearance HAZY (*)    Protein, ur 100 (*)    Bacteria, UA RARE (*)    All other components within normal limits  RESP PANEL BY RT-PCR (RSV, FLU A&B, COVID)  RVPGX2  LIPASE, BLOOD  POC URINE PREG, ED     EKG     RADIOLOGY Please see ED Course for my review and interpretation.  I personally reviewed all radiographic images ordered to evaluate for the above acute complaints and reviewed radiology reports and findings.  These findings were  personally discussed with the patient.  Please see medical record for radiology report.    PROCEDURES:  Critical Care performed: No  Procedures   MEDICATIONS ORDERED IN ED: Medications  azithromycin  (ZITHROMAX ) tablet 500 mg (has no administration in time range)  ondansetron  (ZOFRAN -ODT) disintegrating tablet 4 mg (has no administration in time range)  predniSONE  (DELTASONE ) tablet 40 mg (has no administration in time range)  iohexol  (OMNIPAQUE ) 350 MG/ML injection 125 mL (125 mLs Intravenous Contrast Given 09/09/23 2053)     IMPRESSION / MDM / ASSESSMENT AND PLAN / ED COURSE  I reviewed the triage vital signs and the nursing notes.                              Differential diagnosis includes, but is not limited to, enteritis, gastritis, musculoskeletal strain, pneumonia, appendicitis, colitis, SBO  Patient presenting to the ER for evaluation of symptoms as  described above.  Based on symptoms, risk factors and considered above differential, this presenting complaint could reflect a potentially life-threatening illness therefore the patient will be placed on continuous pulse oximetry and telemetry for monitoring.  Laboratory evaluation will be sent to evaluate for the above complaints.      Clinical Course as of 09/09/23 2230  Wed Sep 09, 2023  2124 CT imaging on my review and interpretation without evidence of perforation. [PR]    Clinical Course User Index [PR] Lang Dover, MD   CT imaging without acute findings.  I do suspect the patient's pain is secondary to musculoskeletal pain in the setting of persistent frequent cough.  Exam with findings concerning for bronchitis.  Has responded to steroids and bronchodilators in the past will place back on this.  Will also cover with antibiotic given productive nature of cough.  Her chest x-ray is negative.  She does appear stable and appropriate for outpatient follow-up.  FINAL CLINICAL IMPRESSION(S) / ED DIAGNOSES   Final diagnoses:  Generalized abdominal pain  Subacute cough     Rx / DC Orders   ED Discharge Orders          Ordered    ondansetron  (ZOFRAN -ODT) 4 MG disintegrating tablet  Every 8 hours PRN        09/09/23 2219    predniSONE  (DELTASONE ) 10 MG tablet  Daily        09/09/23 2219    azithromycin  (ZITHROMAX  Z-PAK) 250 MG tablet        09/09/23 2219             Note:  This document was prepared using Dragon voice recognition software and may include unintentional dictation errors.    Lang Dover, MD 09/09/23 2230

## 2023-09-09 NOTE — Progress Notes (Signed)
 Established patient visit   Patient: Leah Bright   DOB: 12/26/1990   32 y.o. Female  MRN: [id number] Visit Date: 09/09/2023  Today's healthcare provider: LAURAINE LOISE BUOY, DO   Chief Complaint  Patient presents with   Sore Throat    Started Christmas Eve, had improved but now worsening, has lost voice   Nasal Congestion   Cough    Worsening, home COVID negative today   Nausea   Fever    Feels feverish intermittently, but not measured fevers since beginning of illness   Emesis    Twice today   Subjective    Sore Throat  Associated symptoms include abdominal pain, congestion, coughing, diarrhea (overall unchanged from baseline), headaches and vomiting. Pertinent negatives include no shortness of breath.  Cough Associated symptoms include a fever, headaches and myalgias. Pertinent negatives include no chest pain, chills or shortness of breath.  Fever  Associated symptoms include abdominal pain, congestion, coughing, diarrhea (overall unchanged from baseline), headaches and vomiting. Pertinent negatives include no chest pain or nausea.  Emesis  Associated symptoms include abdominal pain, coughing, diarrhea (overall unchanged from baseline), a fever, headaches and myalgias. Pertinent negatives include no chest pain, chills or dizziness.    The patient, with a history of right renal atrophy, presented with a persistent cough and nasal congestion that began on Christmas Eve. The cough is occasionally productive of clear sputum and is associated with shortness of breath. The patient also reports episodes of gagging and occasional coughing fits. Over-the-counter cold and flu medications, including the Equate generic version of DayQuil and Nyquil, have provided some relief.  The patient has also been experiencing headaches, which have improved with the aforementioned medications. She reports significant fatigue and a desire to rest frequently. The patient has also been experiencing  gastrointestinal symptoms, including a 'gurgly' stomach and two episodes of vomiting. She has had a decreased appetite but has been able to keep down food and fluids. The patient also reports ongoing diarrhea, which she attributes to the removal of her gallbladder. She denies any change in her stool relative to her usual loose stool. She also has history of gastric band surgery and has the scar from the port site on the left upper quadrant.    She notes that she was having pain last week in the right upper quadrant which was a stabbing sharp pain that last for approximately an hour and then subsided.  She notes that she is now experiencing the same sort of pain in her left upper quadrant at the location of her scar from her lab-band port.  The patient has noticed a decrease in her sense of smell, which is slowly returning. She has also been experiencing generalized body aches and joint pains, likening the sensation to being 'wrung out.' The patient has not been sexually active since prior to her most recent period due to a decreased sex drive and ongoing illness. She has irregular menstrual cycles and has not had a period since November.  The patient has been managing her symptoms at home with a humidifier and a nebulizer, as well as Vicks vapor rub for the cough. She has also been ensuring adequate fluid intake and using her albuterol  as needed.      Medications: Outpatient Medications Prior to Visit  Medication Sig Note   BUSPIRONE  HCL PO Take by mouth.    VENLAFAXINE  HCL ER PO Take 1 tablet by mouth daily.    albuterol  (VENTOLIN  HFA) 108 (  90 Base) MCG/ACT inhaler Inhale 2 puffs into the lungs every 6 (six) hours as needed for wheezing or shortness of breath.    amLODipine  (NORVASC ) 10 MG tablet Take 1 tablet (10 mg total) by mouth daily.    benzonatate  (TESSALON ) 100 MG capsule Take 1 capsule (100 mg total) by mouth 2 (two) times daily as needed for cough.    cetirizine (ZYRTEC) 10 MG tablet  Take 10 mg by mouth daily.    fluticasone  (VERAMYST) 27.5 MCG/SPRAY nasal spray Place 2 sprays into the nose daily.    levothyroxine  (SYNTHROID ) 175 MCG tablet Take 1 tablet by mouth once daily    lisinopril  (ZESTRIL ) 5 MG tablet Take 1 tablet (5 mg total) by mouth daily.    metFORMIN  (GLUCOPHAGE -XR) 500 MG 24 hr tablet Take 2 tablets (1,000 mg total) by mouth 2 (two) times daily with a meal.    pioglitazone  (ACTOS ) 45 MG tablet Take 1 tablet (45 mg total) by mouth daily.    rosuvastatin  (CRESTOR ) 20 MG tablet Take 1 tablet (20 mg total) by mouth daily.    [DISCONTINUED] buPROPion  (WELLBUTRIN  XL) 150 MG 24 hr tablet Take 1 tablet (150 mg total) by mouth daily. 09/09/2023: felt really bad   [DISCONTINUED] FLUoxetine  (PROZAC ) 20 MG capsule Take 1 capsule (20 mg total) by mouth daily.    [DISCONTINUED] loperamide  (IMODIUM ) 2 MG capsule Take 1 capsule (2 mg total) by mouth as needed for diarrhea or loose stools. Take 4 mg (2 capsules) orally followed by 2 mg (1 capsule) after each unformed stool; MAX 16 mg (8 capsules) per day    [DISCONTINUED] ondansetron  (ZOFRAN -ODT) 4 MG disintegrating tablet Take 1 tablet (4 mg total) by mouth every 8 (eight) hours as needed for nausea or vomiting.    [DISCONTINUED] predniSONE  (STERAPRED UNI-PAK 21 TAB) 10 MG (21) TBPK tablet 6 tabs day 1, 5 tabs day 2, 4 tabs day 3, 3 tabs day 4, 2 tabs day 5, 1 tab day 6    No facility-administered medications prior to visit.    Review of Systems  Constitutional:  Positive for activity change, fatigue and fever. Negative for appetite change and chills.  HENT:  Positive for congestion.   Respiratory:  Positive for cough. Negative for chest tightness and shortness of breath.   Cardiovascular:  Negative for chest pain and palpitations.  Gastrointestinal:  Positive for abdominal pain, diarrhea (overall unchanged from baseline) and vomiting. Negative for constipation and nausea.  Musculoskeletal:  Positive for myalgias.   Neurological:  Positive for headaches. Negative for dizziness, weakness and light-headedness.        Objective    BP (!) 142/96 (BP Location: Left Wrist, Patient Position: Sitting, Cuff Size: Normal)   Pulse 99   Temp 98.5 F (36.9 C)   Resp 20   Ht 5' 7 (1.702 m)   Wt (!) 310 lb (140.6 kg)   LMP 08/01/2023 (Exact Date)   SpO2 98%   BMI 48.55 kg/m     Physical Exam Vitals reviewed.  Constitutional:      General: She is not in acute distress.    Appearance: Normal appearance. She is well-developed. She is not diaphoretic.  HENT:     Head: Normocephalic and atraumatic.     Right Ear: External ear normal.     Left Ear: External ear normal.     Ears:     Comments: Cerumen obstructing both canal; unable to visualize TMs. Neither side if impacted.    Nose: Nose normal.  Mouth/Throat:     Mouth: Mucous membranes are moist.     Pharynx: Oropharynx is clear. No oropharyngeal exudate.     Tonsils: No tonsillar exudate or tonsillar abscesses. 0 on the right. 0 on the left.  Eyes:     General: No scleral icterus.    Conjunctiva/sclera: Conjunctivae normal.     Pupils: Pupils are equal, round, and reactive to light.  Cardiovascular:     Rate and Rhythm: Normal rate and regular rhythm.     Pulses: Normal pulses.     Heart sounds: Normal heart sounds. No murmur heard. Pulmonary:     Effort: Pulmonary effort is normal. No respiratory distress.     Breath sounds: Normal breath sounds. No wheezing or rales.  Abdominal:     General: Abdomen is protuberant. A surgical scar is present. There is no distension.     Tenderness: There is abdominal tenderness in the right upper quadrant and left upper quadrant. There is guarding.     Comments: Patient got of the exam tablet and doubled over due to exacerbation of pain on palpation.  Musculoskeletal:     Cervical back: Neck supple.     Right lower leg: No edema.     Left lower leg: No edema.  Lymphadenopathy:     Cervical: No  cervical adenopathy.  Skin:    General: Skin is warm and dry.     Findings: No rash.  Neurological:     Mental Status: She is alert.       Assessment & Plan    Acute laryngitis  Acute cough -     DG Chest 2 View; Future -     Hydrocod Poli-Chlorphe Poli ER; Take 5 mLs by mouth every 12 (twelve) hours as needed for cough.  Dispense: 115 mL; Refill: 0  Pain of upper abdomen -     CT ABDOMEN W CONTRAST; Future -     CBC with Differential/Platelet -     Lipase -     Amylase  Nausea and vomiting, unspecified vomiting type -     CBC with Differential/Platelet -     Lipase -     Amylase  Pain of Upper Abdomen Patient noted to have significant pain with palpation of her abdomen. Given her medical history and ongoing constellation of constitutional symptoms with fairly mild respiratory symptoms, differentials include but are not limited to concern for colitis, abscess, and pancreatitis. Recommended patient proceed to the ER for more emergent evaluation which patient refused. Subsequently ordered CT and lab work as noted.  Nausea and Vomiting Reports nausea (brief) abruptly followed by vomiting, with two episodes today. Symptoms occur after coughing or during bathroom visits. Able to keep food and fluids down after initial days of illness. Discussed maintaining hydration and eating small, bland meals. - Evaluate as noted above. - Monitor symptoms and report if vomiting persists or worsens  Diarrhea Chronic diarrhea with no significant change in bowel habits. However, patient seemed to waver on this point.  - Evaluate as noted above. - Monitor for changes in bowel habits or new symptoms  Acute Upper Respiratory Infection Symptoms include cough, nasal congestion, shortness of breath during coughing, occasional wheezing, headaches, fatigue, and body aches since Christmas Eve. Using DayQuil and NyQuil with some relief. No fever measured, but feels feverish at night. Physical exam reveals  clear lung sounds. Viral etiology is most likely, but bacterial infection is a differential diagnosis. Discussed potential chest x-ray to rule out pneumonia. Supportive  treatment recommended. - Order chest x-ray to rule out pneumonia or other complications - Recommend fluids, humidifier, and rest - Advise continued use of nebulizer and Vicks vapor rub for symptomatic relief - Follow up with chest x-ray results and consider antibiotics if indicated  Acute Laryngitis Symptomatic management as noted above. - Patient to contact clinic if symptoms are not improving within the next week, as non-infectious etiologies become more likely after three weeks of symptoms.  - Consider ENT referral at that time if appropriate  Acute Cough Tussionex prescribed for management of ongoing cough. - Patient to return to clinic if symptoms worsen.  Irregular Menstrual Cycles Reports irregular menstrual cycles, last period in November. Patient emphasized no sexual activity since before her menstrual cycle the month prior. Recommended urine pregnancy test before chest x-ray, which patient refused.  Follow-up - Send CT abdomen with contrast and chest x-ray orders to the hospital - Patient to have lab work drawn - Follow up on chest x-ray results and potential need for antibiotics - Consider ENT referral if throat symptoms persist beyond 3-4 weeks.   Recommended patient proceed to the ER to be evaluated on a more emergent evaluation, which she refused. Ordered chest XR and STAT CT abdomen as noted.  Return in about 1 week (around 09/16/2023) for recheck, HTN.      I discussed the assessment and treatment plan with the patient  The patient was provided an opportunity to ask questions and all were answered. The patient agreed with the plan and demonstrated an understanding of the instructions.   The patient was advised to call back or seek an in-person evaluation if the symptoms worsen or if the condition fails  to improve as anticipated.    LAURAINE LOISE BUOY, DO  [facility] [phone] (phone) [phone] (fax)  [facility]

## 2023-09-09 NOTE — ED Triage Notes (Signed)
 Pt sts that she has been sick since xmas. Pt sts that she has been having abd pain that is all through out her abd. Pt sts that she has been Vomiting and diarrhea.

## 2023-09-09 NOTE — Telephone Encounter (Signed)
 I attempted to call patient following her appointment at our office. It went immediately to VM and I left VM asking her to return our call. Dr. Donzella would like for her to return to have her labs drawn today if possible.  PEC if patient calls back, please advise her of Dr. Greggory request.

## 2023-09-09 NOTE — Telephone Encounter (Signed)
  Chief Complaint: sore throat  Symptoms: sore throat, hoarseness, cough with congestion, vomiting and SOB  Frequency: last week  Pertinent Negatives: pt unsure if she had fever or not  Disposition: [] ED /[] Urgent Care (no appt availability in office) / [x] Appointment(In office/virtual)/ []  [facility]/ [] Home Care/ [] Refused Recommended Disposition /[] [facility]/ []  Follow-up with PCP Additional Notes: scheduled OV today at 1300 with Dr. Donzella.   Reason for Disposition  SEVERE (e.g., excruciating) throat pain  Answer Assessment - Initial Assessment Questions 1. ONSET: When did the throat start hurting? (Hours or days ago)      Last week  2. SEVERITY: How bad is the sore throat? (Scale 1-10; mild, moderate or severe)   - MILD (1-3):  Doesn't interfere with eating or normal activities.   - MODERATE (4-7): Interferes with eating some solids and normal activities.   - SEVERE (8-10):  Excruciating pain, interferes with most normal activities.   - SEVERE WITH DYSPHAGIA (10): Can't swallow liquids, drooling.     Severe  4.  VIRAL SYMPTOMS: Are there any symptoms of a cold, such as a runny nose, cough, hoarse voice or red eyes?      Yes cough with congestion hoarseness 5. FEVER: Do you have a fever? If Yes, ask: What is your temperature, how was it measured, and when did it start?     Unsure  7. OTHER SYMPTOMS: Do you have any other symptoms? (e.g., difficulty breathing, headache, rash)     Vomiting and SOB  Protocols used: Sore Throat-A-AH

## 2023-09-15 ENCOUNTER — Encounter: Payer: Self-pay | Admitting: Family Medicine

## 2023-10-08 ENCOUNTER — Telehealth: Payer: Self-pay

## 2023-10-08 NOTE — Telephone Encounter (Signed)
 PA needed for Hydrocod Poli-Chlorphepoli ER 10-08 mg/5Ml suspension  WJX:[id number]

## 2023-10-13 ENCOUNTER — Telehealth: Payer: Self-pay | Admitting: Family Medicine

## 2023-10-13 NOTE — Telephone Encounter (Signed)
[facility] pharmacy is requesting refill levothyroxine (SYNTHROID) 175 MCG tablet   Please advise

## 2023-10-13 NOTE — Telephone Encounter (Signed)
Does she need to have her thyroid check?  Last checked was 0911/2024 and Leah Bright put on her note under labs" recommend changes to assist "  Please review

## 2023-10-14 ENCOUNTER — Other Ambulatory Visit (HOSPITAL_COMMUNITY): Payer: Self-pay

## 2023-10-14 ENCOUNTER — Telehealth: Payer: Self-pay

## 2023-10-14 NOTE — Telephone Encounter (Signed)
Pharmacy Patient Advocate Encounter  Received notification from [employer] that Prior Authorization for Hydrocod Poli-Chlorphepoli ER 10-08 mg/5Ml suspension  has been APPROVED from 10/14/23 to 04/12/24. Ran test claim, Copay is $0. This test claim was processed through [facility]- copay amounts may vary at other pharmacies due to pharmacy/plan contracts, or as the patient moves through the different stages of their insurance plan.   PA #/Case ID/Reference #: [id number]

## 2023-10-16 ENCOUNTER — Telehealth: Payer: Self-pay | Admitting: Family Medicine

## 2023-10-16 MED ORDER — LEVOTHYROXINE SODIUM 175 MCG PO TABS: 175.0000 ug | ORAL_TABLET | Freq: Every day | ORAL | 0 refills | Status: DC

## 2023-10-16 NOTE — Telephone Encounter (Signed)
I spoke to patient. She is going to come one day next week for lab work. 30 day prescription sent.

## 2023-10-16 NOTE — Telephone Encounter (Signed)
[facility] is requested refill levothyroxine (SYNTHROID) 175 MCG tablet  Please advise

## 2023-10-18 ENCOUNTER — Other Ambulatory Visit: Payer: Self-pay

## 2023-10-18 DIAGNOSIS — R300 Dysuria: Secondary | ICD-10-CM | POA: Insufficient documentation

## 2023-10-18 DIAGNOSIS — R197 Diarrhea, unspecified: Secondary | ICD-10-CM | POA: Diagnosis not present

## 2023-10-18 DIAGNOSIS — R1031 Right lower quadrant pain: Secondary | ICD-10-CM | POA: Insufficient documentation

## 2023-10-18 DIAGNOSIS — R112 Nausea with vomiting, unspecified: Secondary | ICD-10-CM | POA: Diagnosis not present

## 2023-10-18 LAB — COMPREHENSIVE METABOLIC PANEL
ALT: 20 U/L (ref 0–44)
AST: 19 U/L (ref 15–41)
Albumin: 4 g/dL (ref 3.5–5.0)
Alkaline Phosphatase: 65 U/L (ref 38–126)
Anion gap: 12 (ref 5–15)
BUN: 13 mg/dL (ref 6–20)
CO2: 23 mmol/L (ref 22–32)
Calcium: 9.2 mg/dL (ref 8.9–10.3)
Chloride: 103 mmol/L (ref 98–111)
Creatinine, Ser: 0.78 mg/dL (ref 0.44–1.00)
GFR, Estimated: >60 mL/min (ref >60)
Glucose, Bld: 265 mg/dL — ABNORMAL HIGH (ref 70–99)
Potassium: 4.3 mmol/L (ref 3.5–5.1)
Sodium: 138 mmol/L (ref 135–145)
Total Bilirubin: 0.6 mg/dL (ref 0.0–1.2)
Total Protein: 7.2 g/dL (ref 6.5–8.1)

## 2023-10-18 LAB — CBC
HCT: 41.9 % (ref 36.0–46.0)
Hemoglobin: 14.4 g/dL (ref 12.0–15.0)
MCH: 28 pg (ref 26.0–34.0)
MCHC: 34.4 g/dL (ref 30.0–36.0)
MCV: 81.5 fL (ref 80.0–100.0)
Platelets: 324 10*3/uL (ref 150–400)
RBC: 5.14 MIL/uL — ABNORMAL HIGH (ref 3.87–5.11)
RDW: 13.6 % (ref 11.5–15.5)
WBC: 8.8 10*3/uL (ref 4.0–10.5)
nRBC: 0 % (ref 0.0–0.2)

## 2023-10-18 LAB — LIPASE, BLOOD: Lipase: 32 U/L (ref 11–51)

## 2023-10-18 NOTE — ED Triage Notes (Addendum)
Pt to ed from home via POV for abd pain all day. Pt is caox4, crying and nauseated and some mild vomiting. Pt started her period yesterday as well and she advised its two weeks early.

## 2023-10-19 ENCOUNTER — Encounter (INDEPENDENT_AMBULATORY_CARE_PROVIDER_SITE_OTHER): Payer: Self-pay

## 2023-10-19 ENCOUNTER — Emergency Department: Payer: Managed Care, Other (non HMO)

## 2023-10-19 ENCOUNTER — Emergency Department
Admission: EM | Admit: 2023-10-19 | Discharge: 2023-10-19 | Disposition: A | Discharge status: Home / Self Care | Payer: Managed Care, Other (non HMO) | Attending: Emergency Medicine | Admitting: Emergency Medicine

## 2023-10-19 DIAGNOSIS — R112 Nausea with vomiting, unspecified: Secondary | ICD-10-CM

## 2023-10-19 DIAGNOSIS — R1031 Right lower quadrant pain: Secondary | ICD-10-CM

## 2023-10-19 LAB — URINALYSIS, ROUTINE W REFLEX MICROSCOPIC
Bacteria, UA: NONE SEEN
Bilirubin Urine: NEGATIVE
Glucose, UA: >=500 mg/dL — AB
Ketones, ur: NEGATIVE mg/dL
Leukocytes,Ua: NEGATIVE
Nitrite: NEGATIVE
Protein, ur: 100 mg/dL — AB
RBC / HPF: >50 RBC/hpf (ref 0–5)
Specific Gravity, Urine: 1.033 — ABNORMAL HIGH (ref 1.005–1.030)
pH: 5 (ref 5.0–8.0)

## 2023-10-19 LAB — POC URINE PREG, ED: Preg Test, Ur: NEGATIVE

## 2023-10-19 MED ORDER — SODIUM CHLORIDE 0.9 % IV BOLUS (SEPSIS)
1000.0000 mL | Freq: Once | INTRAVENOUS | Status: AC
  Administered 2023-10-19: 500 mL via INTRAVENOUS

## 2023-10-19 MED ORDER — HYDROMORPHONE HCL 1 MG/ML IJ SOLN
1.0000 mg | Freq: Once | INTRAMUSCULAR | Status: AC
  Administered 2023-10-19: 1 mg via INTRAVENOUS
  Filled 2023-10-19: qty 1

## 2023-10-19 MED ORDER — ONDANSETRON HCL 4 MG/2ML IJ SOLN
4.0000 mg | Freq: Once | INTRAMUSCULAR | Status: AC
  Administered 2023-10-19: 4 mg via INTRAVENOUS
  Filled 2023-10-19: qty 2

## 2023-10-19 MED ORDER — ONDANSETRON 4 MG PO TBDP: 4.0000 mg | ORAL_TABLET | Freq: Four times a day (QID) | ORAL | 0 refills | Status: DC | PRN

## 2023-10-19 MED ORDER — DICYCLOMINE HCL 20 MG PO TABS: 20.0000 mg | ORAL_TABLET | Freq: Three times a day (TID) | ORAL | 0 refills | Status: DC | PRN

## 2023-10-19 MED ORDER — IOHEXOL 300 MG/ML  SOLN
100.0000 mL | Freq: Once | INTRAMUSCULAR | Status: AC | PRN
  Administered 2023-10-19: 100 mL via INTRAVENOUS

## 2023-10-19 MED ORDER — MORPHINE SULFATE (PF) 4 MG/ML IV SOLN
4.0000 mg | Freq: Once | INTRAVENOUS | Status: AC
  Administered 2023-10-19: 4 mg via INTRAVENOUS
  Filled 2023-10-19: qty 1

## 2023-10-19 NOTE — Discharge Instructions (Addendum)
You may alternate Tylenol 1000 mg every 6 hours as needed for pain, fever and Ibuprofen 800 mg every 6-8 hours as needed for pain, fever.  Please take Ibuprofen with food.  Do not take more than 4000 mg of Tylenol (acetaminophen) in a 24 hour period.  You may use over-the-counter Imodium as needed for diarrhea.  I recommend a bland diet for the next several days.

## 2023-10-19 NOTE — ED Provider Notes (Signed)
[facility] Provider Note    Event Date/Time   First MD Initiated Contact with Patient 10/19/23 [phone]     (approximate)   History   Abdominal Pain   HPI  Leah Bright is a 33 y.o. female with history of cholecystectomy, gastric banding, removal of gastric band who presents to the emergency department right lower quadrant pain, nausea, vomiting, diarrhea.  No fever but has had chills.  Has had some dysuria.  No abnormal vaginal bleeding or discharge.   History provided by patient.    History reviewed. No pertinent past medical history.  Past Surgical History:  Procedure Laterality Date   CHOLECYSTECTOMY  2008   endoscopy/colonoscopy   2011   LAPAROSCOPIC GASTRIC BANDING  2007   LAPAROSCOPIC REPAIR AND REMOVAL OF GASTRIC BAND  2011   TONSILLECTOMY  1994   WISDOM TOOTH EXTRACTION      MEDICATIONS:  Prior to Admission medications   Medication Sig Start Date End Date Taking? Authorizing Provider  albuterol (VENTOLIN HFA) 108 (90 Base) MCG/ACT inhaler Inhale 2 puffs into the lungs every 6 (six) hours as needed for wheezing or shortness of breath. 04/22/23   Pardue, Monico Blitz, DO  amLODipine (NORVASC) 10 MG tablet Take 1 tablet (10 mg total) by mouth daily. 05/13/23   Jacky Kindle, FNP  benzonatate (TESSALON) 100 MG capsule Take 1 capsule (100 mg total) by mouth 2 (two) times daily as needed for cough. 07/08/23   Sherlyn Hay, DO  BUSPIRONE HCL PO Take by mouth.    [provider]  cetirizine (ZYRTEC) 10 MG tablet Take 10 mg by mouth daily.    [provider]  chlorpheniramine-HYDROcodone (TUSSIONEX) 10-8 MG/5ML Take 5 mLs by mouth every 12 (twelve) hours as needed for cough. 09/09/23   Pardue, Monico Blitz, DO  fluticasone (VERAMYST) 27.5 MCG/SPRAY nasal spray Place 2 sprays into the nose daily.    [provider]  levothyroxine (SYNTHROID) 175 MCG tablet Take 1 tablet (175 mcg total) by mouth daily. 10/16/23   Sherlyn Hay, DO   lisinopril (ZESTRIL) 5 MG tablet Take 1 tablet (5 mg total) by mouth daily. 05/14/23   Jacky Kindle, FNP  metFORMIN (GLUCOPHAGE-XR) 500 MG 24 hr tablet Take 2 tablets (1,000 mg total) by mouth 2 (two) times daily with a meal. 05/14/23   Jacky Kindle, FNP  ondansetron (ZOFRAN-ODT) 4 MG disintegrating tablet Take 1 tablet (4 mg total) by mouth every 8 (eight) hours as needed for nausea or vomiting. 09/09/23   Willy Eddy, MD  pioglitazone (ACTOS) 45 MG tablet Take 1 tablet (45 mg total) by mouth daily. 05/14/23   Jacky Kindle, FNP  predniSONE (DELTASONE) 10 MG tablet Take 1 tablet (10 mg total) by mouth daily. Day 1-2: Take 50 mg  ( 5 pills) Day 3-4 : Take 40 mg (4pills) Day 5-6: Take 30 mg (3 pills) Day 7-8:  Take 20 mg (2 pills) Day 9:  Take 10mg  (1 pill) 09/09/23   Willy Eddy, MD  rosuvastatin (CRESTOR) 20 MG tablet Take 1 tablet (20 mg total) by mouth daily. 05/14/23   Jacky Kindle, FNP  VENLAFAXINE HCL ER PO Take 1 tablet by mouth daily.    [provider]    Physical Exam   Triage Vital Signs: ED Triage Vitals  Encounter Vitals Group     BP 10/18/23 2022 (!) 153/103     Systolic BP Percentile --      Diastolic  BP Percentile --      Pulse Rate 10/18/23 2022 99     Resp 10/18/23 2022 20     Temp 10/18/23 2022 98.7 F (37.1 C)     Temp Source 10/18/23 2022 Oral     SpO2 10/18/23 2022 95 %     Weight 10/18/23 2023 289 lb (131.1 kg)     Height 10/18/23 2023 5\' 5"  (1.651 m)     Head Circumference --      Peak Flow --      Pain Score 10/18/23 2031 10     Pain Loc --      Pain Education --      Exclude from Growth Chart --     Most recent vital signs: Vitals:   10/19/23 0720 10/19/23 0722  BP:  (!) 150/86  Pulse:  73  Resp:  18  Temp: 97.7 F (36.5 C) 97.7 F (36.5 C)  SpO2:  96%    CONSTITUTIONAL: Alert, responds appropriately to questions. Well-appearing; well-nourished HEAD: Normocephalic, atraumatic EYES: Conjunctivae clear, pupils appear equal,  sclera nonicteric ENT: normal nose; moist mucous membranes NECK: Supple, normal ROM CARD: RRR; S1 and S2 appreciated RESP: Normal chest excursion without splinting or tachypnea; breath sounds clear and equal bilaterally; no wheezes, no rhonchi, no rales, no hypoxia or respiratory distress, speaking full sentences ABD/GI: Non-distended; soft, tender to palpation at McBurney's point without guarding or rebound BACK: The back appears normal EXT: Normal ROM in all joints; no deformity noted, no edema SKIN: Normal color for age and race; warm; no rash on exposed skin NEURO: Moves all extremities equally, normal speech PSYCH: The patient's mood and manner are appropriate.   ED Results / Procedures / Treatments   LABS: (all labs ordered are listed, but only abnormal results are displayed) Labs Reviewed  COMPREHENSIVE METABOLIC PANEL - Abnormal; Notable for the following components:      Result Value   Glucose, Bld 265 (*)    All other components within normal limits  CBC - Abnormal; Notable for the following components:   RBC 5.14 (*)    All other components within normal limits  URINALYSIS, ROUTINE W REFLEX MICROSCOPIC - Abnormal; Notable for the following components:   Color, Urine AMBER (*)    APPearance HAZY (*)    Specific Gravity, Urine 1.033 (*)    Glucose, UA >=500 (*)    Hgb urine dipstick LARGE (*)    Protein, ur 100 (*)    All other components within normal limits  LIPASE, BLOOD  POC URINE PREG, ED     EKG:   RADIOLOGY: My personal review and interpretation of imaging: CT shows normal appendix, normal ovaries.  Ultrasound unable to visualize ovaries.  I have personally reviewed all radiology reports.   US PELVIC COMPLETE WITH TRANSVAGINAL Result Date: 10/19/2023 CLINICAL DATA:  Pelvic pain EXAM: TRANSABDOMINAL AND TRANSVAGINAL ULTRASOUND OF PELVIS TECHNIQUE: Both transabdominal and transvaginal ultrasound examinations of the pelvis were performed. Transabdominal  technique was performed for global imaging of the pelvis including uterus, ovaries, adnexal regions, and pelvic cul-de-sac. It was necessary to proceed with endovaginal exam following the transabdominal exam to visualize the ovaries and endometrium. COMPARISON:  Abdominal CT from earlier today FINDINGS: Uterus Measurements: 9 x 5 x 5 cm = volume: 100 mL. No fibroids or other mass visualized. Endometrium Thickness: 10 mm.  No focal abnormality visualized. Right ovary Not visualized.  No adnexal mass. Left ovary Not visualized.  No adnexal mass. Other findings No  abnormal free fluid. IMPRESSION: 1. Nonvisualized ovaries. Both ovaries have a normal appearance on preceding CT. 2. Normal appearance of the uterus. Electronically Signed   By: Tiburcio Pea M.D.   On: 10/19/2023 07:30   CT ABDOMEN PELVIS W CONTRAST Result Date: 10/19/2023 CLINICAL DATA:  Right lower quadrant abdominal pain EXAM: CT ABDOMEN AND PELVIS WITH CONTRAST TECHNIQUE: Multidetector CT imaging of the abdomen and pelvis was performed using the standard protocol following bolus administration of intravenous contrast. RADIATION DOSE REDUCTION: This exam was performed according to the departmental dose-optimization program which includes automated exposure control, adjustment of the mA and/or kV according to patient size and/or use of iterative reconstruction technique. CONTRAST:  OMNIPAQUE IOHEXOL 300 MG/ML  SOLN COMPARISON:  09/09/2023 FINDINGS: Lower chest:  No contributory findings. Hepatobiliary: No focal liver abnormality.Cholecystectomy. No biliary dilatation. Pancreas: Unremarkable. Spleen: Unremarkable. Adrenals/Urinary Tract: Negative adrenals. No hydronephrosis or ureteral stone. Pronounced right renal atrophy with small calculus. Unremarkable bladder. Stomach/Bowel: Redundant appearance at the GE junction protruding into the gastric cardia lumen, also seen on prior and in 2021, attributed to under distension. Vascular/Lymphatic: No  acute vascular abnormality. No mass or adenopathy. Reproductive:No pathologic findings.  Tampon present. Other: No ascites or pneumoperitoneum. Musculoskeletal: No acute abnormalities. L3 limbus vertebra with L2-3 disc space narrowing and gas containing fissure. IMPRESSION: 1. No acute finding or change from prior.  No appendicitis. 2. Advanced right renal atrophy. Electronically Signed   By: Tiburcio Pea M.D.   On: 10/19/2023 05:59     PROCEDURES:  Critical Care performed: No    Procedures    IMPRESSION / MDM / ASSESSMENT AND PLAN / ED COURSE  I reviewed the triage vital signs and the nursing notes.    Patient here for right lower quad abdominal pain, vomiting and diarrhea.     DIFFERENTIAL DIAGNOSIS (includes but not limited to):   Appendicitis, UTI, pyelonephritis, kidney stone, colitis, diverticulitis, gastroenteritis, ovarian cyst, ovarian torsion, PID   Patient's presentation is most consistent with acute presentation with potential threat to life or bodily function.   PLAN: Workup initiated from triage.  No leukocytosis.  Normal LFTs, lipase, creatinine.  Urine does show blood.  Patient is on her menstrual cycle.  Pregnancy test negative.  Will obtain CT of the abdomen pelvis for further evaluation.  Will give pain and nausea medicine.   MEDICATIONS GIVEN IN ED: Medications  sodium chloride 0.9 % bolus 1,000 mL (0 mLs Intravenous Stopped 10/19/23 0524)  morphine (PF) 4 MG/ML injection 4 mg (4 mg Intravenous Given 10/19/23 0429)  ondansetron (ZOFRAN) injection 4 mg (4 mg Intravenous Given 10/19/23 0429)  iohexol (OMNIPAQUE) 300 MG/ML solution 100 mL (100 mLs Intravenous Contrast Given 10/19/23 0526)  HYDROmorphone (DILAUDID) injection 1 mg (1 mg Intravenous Given 10/19/23 0729)     ED COURSE: CT of the abdomen pelvis shows no acute abnormality, normal appendix.  Will obtain transvaginal ultrasound with Doppler.  Ultrasound was not able to visualize patient's ovaries  but they appeared normal on CT scan today which makes me feel like this is less likely torsion.  Suspect symptoms likely secondary to a viral gastroenteritis given vomiting, diarrhea.  Low suspicion clinically for PID, STI.  Will p.o. challenge but anticipate discharge home with Zofran, Bentyl, Tylenol Motrin and Imodium as needed.   At this time, I do not feel there is any life-threatening condition present. I reviewed all nursing notes, vitals, pertinent previous records.  All lab and urine results, EKGs, imaging ordered have been independently  reviewed and interpreted by myself.  I reviewed all available radiology reports from any imaging ordered this visit.  Based on my assessment, I feel the patient is safe to be discharged home without further emergent workup and can continue workup as an outpatient as needed. Discussed all findings, treatment plan as well as usual and customary return precautions.  They verbalize understanding and are comfortable with this plan.  Outpatient follow-up has been provided as needed.  All questions have been answered.    CONSULTS:  none   OUTSIDE RECORDS REVIEWED: Reviewed last family medicine in 09/09/2023.       FINAL CLINICAL IMPRESSION(S) / ED DIAGNOSES   Final diagnoses:  RLQ abdominal pain  Nausea vomiting and diarrhea     Rx / DC Orders   ED Discharge Orders          Ordered    ondansetron (ZOFRAN-ODT) 4 MG disintegrating tablet  Every 6 hours PRN        10/19/23 0737    dicyclomine (BENTYL) 20 MG tablet  Every 8 hours PRN        10/19/23 0737             Note:  This document was prepared using Dragon voice recognition software and may include unintentional dictation errors.   [place], Layla Maw, DO 10/19/23 [phone]

## 2023-10-19 NOTE — ED Notes (Signed)
 Pt to CT

## 2023-11-24 ENCOUNTER — Encounter: Payer: Self-pay | Admitting: Family Medicine

## 2023-11-24 ENCOUNTER — Other Ambulatory Visit: Payer: Self-pay | Admitting: Family Medicine

## 2023-11-24 ENCOUNTER — Telehealth: Payer: Self-pay | Admitting: Family Medicine

## 2023-11-24 DIAGNOSIS — B850 Pediculosis due to Pediculus humanus capitis: Secondary | ICD-10-CM

## 2023-11-24 MED ORDER — IVERMECTIN 0.5 % EX LOTN: TOPICAL_LOTION | CUTANEOUS | 0 refills | Status: DC

## 2023-11-24 NOTE — Telephone Encounter (Signed)
 Patient called in thru [facility] to see if she can get appt for lice. Checked with provider and she is going to prescribe something for her without the appt

## 2023-12-22 LAB — HM DIABETES EYE EXAM

## 2023-12-23 ENCOUNTER — Ambulatory Visit: Payer: Self-pay

## 2023-12-23 NOTE — Telephone Encounter (Signed)
 Copied from CRM [id number]. Topic: Clinical - Red Word Triage >> Dec 23, 2023 11:45 AM Juluis Ok wrote: [facility] that prompted transfer to Nurse Triage: vomiting, headaches, abdominal pain   Chief Complaint: Abdominal pain Symptoms: Abdominal pain, vomiting, headache  Frequency: Constant  Disposition: [] ED /[x] Urgent Care (no appt availability in office) / [] Appointment(In office/virtual)/ []  [facility]/ [] Home Care/ [] Refused Recommended Disposition /[] [facility]/ []  Follow-up with PCP Additional Notes: Patient states she woke up this morning with left sided abdominal pain, vomiting, and headache. She states that her pain is 3/10 but is worsened when vomiting. She states she has vomited twice so far this morning. Patient advised that there are no appointments in the office until Friday and that she should go to urgent care for evaluation. Patient understood and is agreeable with this plan.     Reason for Disposition  [1] MILD-MODERATE pain AND [2] constant AND [3] present > 2 hours  Answer Assessment - Initial Assessment Questions 1. LOCATION: "Where does it hurt?"      Left sided pain 2. RADIATION: "Does the pain shoot anywhere else?" (e.g., chest, back)     Does not radiate  3. ONSET: "When did the pain begin?" (e.g., minutes, hours or days ago)      This morning  4. SUDDEN: "Gradual or sudden onset?"     Sudden  5. PATTERN "Does the pain come and go, or is it constant?"    - If it comes and goes: "How long does it last?" "Do you have pain now?"     (Note: Comes and goes means the pain is intermittent. It goes away completely between bouts.)    - If constant: "Is it getting better, staying the same, or getting worse?"      (Note: Constant means the pain never goes away completely; most serious pain is constant and gets worse.)      Constant   6. SEVERITY: "How bad is the pain?"  (e.g., Scale 1-10; mild, moderate, or severe)    - MILD (1-3): Doesn't interfere  with normal activities, abdomen soft and not tender to touch.     - MODERATE (4-7): Interferes with normal activities or awakens from sleep, abdomen tender to touch.     - SEVERE (8-10): Excruciating pain, doubled over, unable to do any normal activities.       3/10 7. RECURRENT SYMPTOM: "Have you ever had this type of stomach pain before?" If Yes, ask: "When was the last time?" and "What happened that time?"      Unsure  8. CAUSE: "What do you think is causing the stomach pain?"     Unsure  9. RELIEVING/AGGRAVATING FACTORS: "What makes it better or worse?" (e.g., antacids, bending or twisting motion, bowel movement)     Vomiting worsens pain 10. OTHER SYMPTOMS: "Do you have any other symptoms?" (e.g., back pain, diarrhea, fever, urination pain, vomiting)       Vomiting, headache  11. PREGNANCY: "Is there any chance you are pregnant?" "When was your last menstrual period?"       No  Protocols used: Abdominal Pain - [facility]

## 2024-01-20 ENCOUNTER — Ambulatory Visit: Payer: Self-pay

## 2024-01-20 ENCOUNTER — Other Ambulatory Visit: Payer: Self-pay | Admitting: Family Medicine

## 2024-01-20 ENCOUNTER — Ambulatory Visit: Admitting: Family Medicine

## 2024-01-20 NOTE — Telephone Encounter (Signed)
 Pt called in requesting information about what will be taking place at her appt on 5/23. Per MyChart, the appt note states "testing for spectrum." Pt concerned about what this means.  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 Pt would like to know whether imaging or labs will be drawn. RN advised pt that it is unlikely she will need imaging or labs. RN advised pt it is likely the appt will consist of a thorough history review and self-assessments. CAL is currently on lunch. Pt would appreciate a call after 2pm about what will take place at this appt.    Reason for Disposition  Health Information question, no triage required and triager able to answer question  Answer Assessment - Initial Assessment Questions 1. REASON FOR CALL or QUESTION: "What is your reason for calling today?" or "How can I best help you?" or "What question do you have that I can help answer?"     Pt would like information about what will be taking place at her appt on Friday 5/23  Protocols used: Information Only Call - No Triage-A-AH

## 2024-01-22 ENCOUNTER — Ambulatory Visit: Admitting: Family Medicine

## 2024-01-22 ENCOUNTER — Ambulatory Visit: Admitting: Nurse Practitioner

## 2024-01-22 ENCOUNTER — Other Ambulatory Visit: Payer: Self-pay | Admitting: Family Medicine

## 2024-01-22 NOTE — Telephone Encounter (Unsigned)
 Copied from CRM [id number]. Topic: Clinical - Medication Refill >> Jan 22, 2024  8:24 AM Marissa P wrote: Medication: metFORMIN  (GLUCOPHAGE -XR) 500 MG 24 hr tablet  Has the patient contacted their pharmacy? No (Agent: If no, request that the patient contact the pharmacy for the refill. If patient does not wish to contact the pharmacy document the reason why and proceed with request.) (Agent: If yes, when and what did the pharmacy advise?)  This is the patient's preferred pharmacy:  [facility] [address], [place] - [address] [employer] [place] [address] Phone: [phone] Fax: [phone]  Is this the correct pharmacy for this prescription? Yes If no, delete pharmacy and type the correct one.   Has the prescription been filled recently? Yes  Is the patient out of the medication? Yes  Has the patient been seen for an appointment in the last year OR does the patient have an upcoming appointment? Yes  Can we respond through MyChart? Yes  Agent: Please be advised that Rx refills may take up to 3 business days. We ask that you follow-up with your pharmacy.

## 2024-01-22 NOTE — Telephone Encounter (Signed)
 Pt advised. States "ok"

## 2024-01-26 NOTE — Telephone Encounter (Signed)
 Rx 05/14/23 #360 [id number] Requested Prescriptions  Pending Prescriptions Disp Refills   metFORMIN  (GLUCOPHAGE -XR) 500 MG 24 hr tablet 360 tablet 3    Sig: Take 2 tablets (1,000 mg total) by mouth 2 (two) times daily with a meal.     Endocrinology:  Diabetes - Biguanides Failed - 01/26/2024 11:15 AM      Failed - HBA1C is between 0 and 7.9 and within 180 days    Hgb A1c MFr Bld  Date Value Ref Range Status  05/13/2023 10.0 (H) 4.8 - 5.6 % Final    Comment:             Prediabetes: 5.7 - 6.4          Diabetes: >6.4          Glycemic control for adults with diabetes: <7.0          Failed - B12 Level in normal range and within 720 days    No results found for: "VITAMINB12"       Failed - Valid encounter within last 6 months    Recent Outpatient Visits   None            Passed - Cr in normal range and within 360 days    Creatinine, Ser  Date Value Ref Range Status  10/18/2023 0.78 0.44 - 1.00 mg/dL Final         Passed - eGFR in normal range and within 360 days    GFR calc Af Amer  Date Value Ref Range Status  05/22/2020 >60 >60 mL/min Final   GFR, Estimated  Date Value Ref Range Status  10/18/2023 >60 >60 mL/min Final    Comment:    (NOTE) Calculated using the CKD-EPI Creatinine Equation (2021)    eGFR  Date Value Ref Range Status  05/13/2023 117 >59 mL/min/1.73 Final         Passed - CBC within normal limits and completed in the last 12 months    WBC  Date Value Ref Range Status  10/18/2023 8.8 4.0 - 10.5 K/uL Final   RBC  Date Value Ref Range Status  10/18/2023 5.14 (H) 3.87 - 5.11 MIL/uL Final   Hemoglobin  Date Value Ref Range Status  10/18/2023 14.4 12.0 - 15.0 g/dL Final  82/95/6213 08.6 11.1 - 15.9 g/dL Final   HCT  Date Value Ref Range Status  10/18/2023 41.9 36.0 - 46.0 % Final   Hematocrit  Date Value Ref Range Status  05/13/2023 41.4 34.0 - 46.6 % Final   MCHC  Date Value Ref Range Status  10/18/2023 34.4 30.0 - 36.0 g/dL Final    [facility]  Date Value Ref Range Status  10/18/2023 28.0 26.0 - 34.0 pg Final   MCV  Date Value Ref Range Status  10/18/2023 81.5 80.0 - 100.0 fL Final  05/13/2023 85 79 - 97 fL Final   No results found for: "PLTCOUNTKUC", "LABPLAT", "POCPLA" RDW  Date Value Ref Range Status  10/18/2023 13.6 11.5 - 15.5 % Final  05/13/2023 13.2 11.7 - 15.4 % Final

## 2024-02-11 NOTE — Telephone Encounter (Unsigned)
 Copied from CRM [id number]. Topic: Clinical - Medication Refill >> Feb 11, 2024  2:53 PM Tiffany S wrote: Medication: levothyroxine  (SYNTHROID ) 175 MCG tablet [[id number]]  Need 90 day supply in order for insurance to cover   Has the patient contacted their pharmacy? Yes (Agent: If no, request that the patient contact the pharmacy for the refill. If patient does not wish to contact the pharmacy document the reason why and proceed with request.) (Agent: If yes, when and what did the pharmacy advise?)  This is the patient's preferred pharmacy:  [facility] [address] [place] - [address] [place] [address] Phone: [phone] Fax: [phone]  Is this the correct pharmacy for this prescription? Yes If no, delete pharmacy and type the correct one.   Has the prescription been filled recently? Yes  Is the patient out of the medication? Yes  Has the patient been seen for an appointment in the last year OR does the patient have an upcoming appointment? Yes  Can we respond through MyChart? Yes  Agent: Please be advised that Rx refills may take up to 3 business days. We ask that you follow-up with your pharmacy.

## 2024-03-08 ENCOUNTER — Other Ambulatory Visit: Payer: Self-pay | Admitting: Family Medicine

## 2024-03-21 ENCOUNTER — Ambulatory Visit: Payer: Self-pay

## 2024-03-21 NOTE — Telephone Encounter (Signed)
 FYI Only or Action Required?: Action required by provider: refusing ED, requesting call back with further recommendations/appt options.  Patient was last seen in primary care on 09/09/2023 by Donzella Lauraine SAILOR, DO.  Called Nurse Triage reporting Shortness of Breath, Chest Pain, Cough, Ear Pain, and Sore Throat.  Symptoms began several days ago.  Interventions attempted: Rest, hydration, or home remedies. Nurse had pt use albuterol  inhaler over phone, very little improvement  Symptoms are: rapidly worsening.  Triage Disposition: Go to ED Now (Notify PCP)  Patient/caregiver understands and will follow disposition?: No, refuses disposition       Copied from CRM [id number]. Topic: Clinical - Red Word Triage >> Mar 21, 2024  9:40 AM Delon HERO wrote: Red Word that prompted transfer to Nurse Triage: Patient is calling to report sore throat with right ear pain , popping - only swallowing . With cough and congestion. Pain starting Friday Morning gradually getting worse. 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 No vomiting, Patient does not have a gallbladder. Reason for Disposition  [1] MODERATE difficulty breathing (e.g., speaks in phrases, SOB even at rest, pulse 100-120) AND [2] NEW-onset or WORSE than normal  Answer Assessment - Initial Assessment Questions 1. RESPIRATORY STATUS: Describe your breathing? (e.g., wheezing, shortness of breath, unable to speak, severe coughing)      SOB and tightness in chest Hurts when swallow, when swallow ear pops Cough, not coughing anything up Coughing fits every once in a while 2. ONSET: When did this breathing problem begin?      Started Friday, sore throat, thought just sinuses, gradually getting worse 3. PATTERN Does the difficult breathing come and go, or has it been constant since it started?      After cough, hurts really bad When talking having trouble breathing  feels tight, have to catch my breath 4. SEVERITY: How bad is your breathing? (e.g., mild, moderate, severe)      Speaking in phrases Mild per pt Not using rescue inhaler, didn't start feeling this way until this morning 6. CARDIAC HISTORY: Do you have any history of heart disease? (e.g., heart attack, angina, bypass surgery, angioplasty)      HTN with diabetes 7. LUNG HISTORY: Do you have any history of lung disease?  (e.g., pulmonary embolus, asthma, emphysema)     asthma 9. OTHER SYMPTOMS: Do you have any other symptoms? (e.g., chest pain, cough, dizziness, fever, runny nose)     Ear pain, sore throat, ear popping with swallowing Cough and some coughing fits every once in a while Nasal congestion and post-nasal drip 10. O2 SATURATION MONITOR:  Do you use an oxygen saturation monitor (pulse oximeter) at home? If Yes, ask: What is your reading (oxygen level) today? What is your usual oxygen saturation reading? (e.g., 95%)       no 11. PREGNANCY: Is there any chance you are pregnant? When was your last menstrual period?       no  Mostly the pain when I swallow into ear and when cough No hx blood clot in legs or lungs Chest tightness pretty much right in the middle, mostly throat hurting really bad When swallow, feels like 6-7/10, feels like swallowing glass When get sick, feels like chest when sick, chest feels like that only when/after coughing Last less than 5 min per pt When calm breathing down feels okay Tight cough over phone Pt saying hard to speak with sore throat, pt  taking breath every couple 2-5 ish words Not used nebulizers yet Once start coughing that's when it feels really super tight, not really gasping for air after that but just trying to get it back to where breathing normal What's bothering most is pain when swallow and goes into ear Had pt do rescue inhaler while on phone with nurse, feel little bit better By end of phone call not much improvement in  pt speaking in couple words to phrases, advised ED Pt refusing ED, saying her biggest concern is her throat pain, chest tightness not the biggest thing bothering her   Advised pt use rescue inhaler while on phone with nurse. Advised pt go to ED for symptoms, pt refusing at this time, sending message to PCP for call back with further recommendations/appt options. Alerted [facility] to ED refusal.  Protocols used: Breathing Difficulty-A-AH

## 2024-03-23 NOTE — Telephone Encounter (Signed)
 LVMTCB. OK to advise per Dr.Pardue. no opening in office until the 25th

## 2024-03-25 ENCOUNTER — Ambulatory Visit: Admitting: Family Medicine

## 2024-05-08 ENCOUNTER — Emergency Department
Admission: EM | Admit: 2024-05-08 | Discharge: 2024-05-08 | Disposition: A | Discharge status: Home / Self Care | Attending: Emergency Medicine | Admitting: Emergency Medicine

## 2024-05-08 ENCOUNTER — Other Ambulatory Visit: Payer: Self-pay

## 2024-05-08 DIAGNOSIS — L600 Ingrowing nail: Secondary | ICD-10-CM | POA: Diagnosis present

## 2024-05-08 MED ORDER — AMOXICILLIN-POT CLAVULANATE 875-125 MG PO TABS
1.0000 | ORAL_TABLET | Freq: Once | ORAL | Status: AC
  Administered 2024-05-08: 1 via ORAL
  Filled 2024-05-08: qty 1

## 2024-05-08 MED ORDER — LIDOCAINE HCL (PF) 1 % IJ SOLN
5.0000 mL | Freq: Once | INTRAMUSCULAR | Status: AC
  Administered 2024-05-08: 5 mL
  Filled 2024-05-08: qty 5

## 2024-05-08 MED ORDER — AMOXICILLIN-POT CLAVULANATE 875-125 MG PO TABS: 1.0000 | ORAL_TABLET | Freq: Two times a day (BID) | ORAL | 0 refills | Status: AC

## 2024-05-08 NOTE — ED Provider Notes (Signed)
 [place] EMERGENCY DEPARTMENT AT [facility] Provider Note   CSN: [id number] Arrival date & time: 05/08/24  1251     Patient presents with: Nail Problem   Leah Bright is a 33 y.o. female.  Presents to the emergency department for evaluation of ingrown left great toenail.  Medial border of the left great toe has been ingrown, painful and swollen.  Swelling is been present for several days.  She denies any fevers.  Has had some mild purulent drainage from the soft tissue.  She has moderate pain only with bearing weight.   HPI     Prior to Admission medications   Medication Sig Start Date End Date Taking? Authorizing Provider  amoxicillin -clavulanate (AUGMENTIN ) 875-125 MG tablet Take 1 tablet by mouth 2 (two) times daily for 10 days. 05/08/24 05/18/24 Yes Charlene Debby BROCKS, PA-C  albuterol  (VENTOLIN  HFA) 108 (90 Base) MCG/ACT inhaler Inhale 2 puffs into the lungs every 6 (six) hours as needed for wheezing or shortness of breath. 04/22/23   Pardue, Lauraine SAILOR, DO  amLODipine  (NORVASC ) 10 MG tablet Take 1 tablet (10 mg total) by mouth daily. 05/13/23   Emilio Kelly DASEN, FNP  benzonatate  (TESSALON ) 100 MG capsule Take 1 capsule (100 mg total) by mouth 2 (two) times daily as needed for cough. 07/08/23   Donzella Lauraine SAILOR, DO  BUSPIRONE  HCL PO Take by mouth.    [provider]  cetirizine (ZYRTEC) 10 MG tablet Take 10 mg by mouth daily.    [provider]  chlorpheniramine-HYDROcodone  (TUSSIONEX) 10-8 MG/5ML Take 5 mLs by mouth every 12 (twelve) hours as needed for cough. 09/09/23   Donzella Lauraine SAILOR, DO  dicyclomine  (BENTYL ) 20 MG tablet Take 1 tablet (20 mg total) by mouth every 8 (eight) hours as needed. 10/19/23   Ward, Josette SAILOR, DO  fluticasone  (VERAMYST) 27.5 MCG/SPRAY nasal spray Place 2 sprays into the nose daily.    [provider]  Ivermectin  0.5 % LOTN Apply up to 1 tube topically to dry hair, coating hair and scalp thoroughly; rinse with water after 10 minutes  11/24/23   Donzella Lauraine SAILOR, DO  levothyroxine  (SYNTHROID ) 175 MCG tablet Take 1 tablet by mouth once daily 03/08/24   Donzella Lauraine SAILOR, DO  lisinopril  (ZESTRIL ) 5 MG tablet Take 1 tablet (5 mg total) by mouth daily. 05/14/23   Emilio Kelly DASEN, FNP  metFORMIN  (GLUCOPHAGE -XR) 500 MG 24 hr tablet Take 2 tablets (1,000 mg total) by mouth 2 (two) times daily with a meal. 05/14/23   Emilio Kelly DASEN, FNP  ondansetron  (ZOFRAN -ODT) 4 MG disintegrating tablet Take 1 tablet (4 mg total) by mouth every 6 (six) hours as needed for nausea or vomiting. 10/19/23   Ward, Josette SAILOR, DO  pioglitazone  (ACTOS ) 45 MG tablet Take 1 tablet (45 mg total) by mouth daily. 05/14/23   Emilio Kelly DASEN, FNP  predniSONE  (DELTASONE ) 10 MG tablet Take 1 tablet (10 mg total) by mouth daily. Day 1-2: Take 50 mg  ( 5 pills) Day 3-4 : Take 40 mg (4pills) Day 5-6: Take 30 mg (3 pills) Day 7-8:  Take 20 mg (2 pills) Day 9:  Take 10mg  (1 pill) 09/09/23   Lang Dover, MD  rosuvastatin  (CRESTOR ) 20 MG tablet Take 1 tablet (20 mg total) by mouth daily. 05/14/23   Emilio Kelly DASEN, FNP  VENLAFAXINE  HCL ER PO Take 1 tablet by mouth daily.    [provider]    Allergies: Toradol  [ketorolac  tromethamine ], Codeine, Other,  Pepto-bismol [bismuth], and Tramadol     Review of Systems  Updated Vital Signs BP (!) 157/99   Pulse 82   Temp (!) 97.5 F (36.4 C)   Resp 18   Ht 5' 5 (1.651 m)   Wt 131.5 kg   SpO2 95%   BMI 48.26 kg/m   Physical Exam Constitutional:      Appearance: She is well-developed.  HENT:     Head: Normocephalic and atraumatic.  Eyes:     Conjunctiva/sclera: Conjunctivae normal.  Cardiovascular:     Rate and Rhythm: Normal rate.  Pulmonary:     Effort: Pulmonary effort is normal. No respiratory distress.  Musculoskeletal:        General: Normal range of motion.     Cervical back: Normal range of motion.     Comments: Left great toe with swelling along the medial nail border with mild redness.  No drainage.  Nail  is ingrown into the soft tissues along the medial border.  Skin:    General: Skin is warm.     Findings: No rash.  Neurological:     Mental Status: She is alert and oriented to person, place, and time.  Psychiatric:        Behavior: Behavior normal.        Thought Content: Thought content normal.     (all labs ordered are listed, but only abnormal results are displayed) Labs Reviewed - No data to display  EKG: None  Radiology: No results found.   Excise ingrown toenail  Date/Time: 05/08/2024 1:46 PM  Performed by: Charlene Debby BROCKS, PA-C Authorized by: Charlene Debby BROCKS, PA-C  Patient identity confirmed: verbally with patient Preparation: Patient was prepped and draped in the usual sterile fashion. Local anesthesia used: yes Anesthesia: digital block  Anesthesia: Local anesthesia used: yes Local Anesthetic: lidocaine  1% without epinephrine  Anesthetic total: 4 mL  Sedation: Patient sedated: no  Patient tolerance: patient tolerated the procedure well with no immediate complications Comments: Medial nail border was trimmed 2 to 3 mm all the way up to the base of the nail.  Sliver of nail was removed from the soft tissue and skin was cleansed with Betadine and saline.  Bulky dressing was applied with Vaseline gauze.      Medications Ordered in the ED  amoxicillin -clavulanate (AUGMENTIN ) 875-125 MG per tablet 1 tablet (1 tablet Oral Given 05/08/24 1321)  lidocaine  (PF) (XYLOCAINE ) 1 % injection 5 mL (5 mLs Infiltration Given 05/08/24 1322)                                    Medical Decision Making Risk Prescription drug management.   33 year old female with left great ingrown toenail.  She has a mild infection into the soft tissue along the medial nail border where the nail is ingrown.  Nail was successfully removed after successful digital hematoma block.  Patient was placed into a bulky dressing and she will continue with Augmentin  for 10 days.  She is given strict  return precautions understands signs symptoms return to ER for.  She will follow-up with podiatry.  Final diagnoses:  Ingrown toenail of left foot with infection    ED Discharge Orders          Ordered    amoxicillin -clavulanate (AUGMENTIN ) 875-125 MG tablet  2 times daily        05/08/24 1342  Charlene Debby BROCKS, PA-C 05/08/24 1347    Levander Slate, MD 05/08/24 [phone]

## 2024-05-08 NOTE — ED Triage Notes (Signed)
 Patient states she has an ingrown toenail to left great toe.

## 2024-05-08 NOTE — Discharge Instructions (Signed)
 Please keep bulky dressing on for 24 hours.  After 24 hours you may shower get wet.  Keep wound covered throughout the day.  Soak toe daily for 5 to 10 minutes in half water half hydrogen peroxide .  Take antibiotics as prescribed.  Return to the ER for any increased pain swelling warmth redness or fevers

## 2024-06-25 ENCOUNTER — Other Ambulatory Visit: Payer: Self-pay | Admitting: Family Medicine

## 2024-06-27 ENCOUNTER — Encounter: Payer: Self-pay | Admitting: Physician Assistant

## 2024-06-27 ENCOUNTER — Ambulatory Visit (INDEPENDENT_AMBULATORY_CARE_PROVIDER_SITE_OTHER): Admitting: Physician Assistant

## 2024-06-27 VITALS — BP 143/91 | HR 74 | Temp 97.9°F | Resp 16 | Ht 65.0 in | Wt 327.6 lb

## 2024-06-27 DIAGNOSIS — B001 Herpesviral vesicular dermatitis: Secondary | ICD-10-CM

## 2024-06-27 DIAGNOSIS — R059 Cough, unspecified: Secondary | ICD-10-CM

## 2024-06-27 DIAGNOSIS — J069 Acute upper respiratory infection, unspecified: Secondary | ICD-10-CM | POA: Diagnosis not present

## 2024-06-27 DIAGNOSIS — R490 Dysphonia: Secondary | ICD-10-CM

## 2024-06-27 LAB — POC COVID19 BINAXNOW: SARS Coronavirus 2 Ag: NEGATIVE

## 2024-06-27 LAB — POCT INFLUENZA A/B
Influenza A, POC: NEGATIVE
Influenza B, POC: NEGATIVE

## 2024-06-27 LAB — POCT RAPID STREP A (OFFICE): Rapid Strep A Screen: NEGATIVE

## 2024-06-27 MED ORDER — VALACYCLOVIR HCL 500 MG PO TABS: 500.0000 mg | ORAL_TABLET | Freq: Two times a day (BID) | ORAL | 0 refills | Status: AC

## 2024-06-27 NOTE — Progress Notes (Signed)
 Established patient visit  Patient: Leah Bright   DOB: 1990/09/07   33 y.o. Female  MRN: [id number] Visit Date: 06/27/2024  Today's healthcare provider: Jolynn Spencer, PA-C   Chief Complaint  Patient presents with   Hoarse    Nasal congestion, stomach pain, cough, hoarse onset 4 days otc: cold medicine.    Subjective     HPI     Hoarse    Additional comments: Nasal congestion, stomach pain, cough, hoarse onset 4 days otc: cold medicine.       Last edited by Wilfred Hargis RAMAN, CMA on 06/27/2024 10:17 AM.       Discussed the use of AI scribe software for clinical note transcription with the patient, who gave verbal consent to proceed.  History of Present Illness Leah Bright is a 33 year old female who presents with hoarseness and symptoms of a respiratory infection.  She experiences hoarseness, sore throat, and cough. She had a fever last week but is currently afebrile. She takes over-the-counter cold medications, including Tylenol  and ibuprofen , every four to six hours. She denies significant nasal congestion but notes some pain on the right side, which she attributes to her glasses.       09/09/2023    1:17 PM 07/08/2023    1:32 PM 06/17/2023   11:03 AM  Depression screen PHQ 2/9  Decreased Interest 2 1 1   Down, Depressed, Hopeless 1 1 1   PHQ - 2 Score 3 2 2   Altered sleeping 2 3 3   Tired, decreased energy 2 1 2   Change in appetite 2  2  Feeling bad or failure about yourself  1 1 1   Trouble concentrating 0 0 0  Moving slowly or fidgety/restless 0 0 0  Suicidal thoughts 0 0 0  PHQ-9 Score 10 7 10   Difficult doing work/chores Somewhat difficult  Somewhat difficult      09/09/2023    1:20 PM 07/08/2023    1:32 PM 06/17/2023   11:03 AM 05/13/2023   11:15 AM  GAD 7 : Generalized Anxiety Score  Nervous, Anxious, on Edge 2 2 2 2   Control/stop worrying 1 1 2 1   Worry too much - different things 1 2 2 2   Trouble relaxing 1 1 1 1   Restless 0 0 0 0  Easily annoyed  or irritable 2 2 1 2   Afraid - awful might happen 1 0 0 0  Total GAD 7 Score 8 8 8 8   Anxiety Difficulty Somewhat difficult Somewhat difficult Somewhat difficult     Medications: Outpatient Medications Prior to Visit  Medication Sig   albuterol  (VENTOLIN  HFA) 108 (90 Base) MCG/ACT inhaler Inhale 2 puffs into the lungs every 6 (six) hours as needed for wheezing or shortness of breath.   amLODipine  (NORVASC ) 10 MG tablet Take 1 tablet (10 mg total) by mouth daily.   cetirizine (ZYRTEC) 10 MG tablet Take 10 mg by mouth daily.   escitalopram (LEXAPRO) 20 MG tablet Take 20 mg by mouth at bedtime.   fluticasone  (VERAMYST) 27.5 MCG/SPRAY nasal spray Place 2 sprays into the nose daily.   levothyroxine  (SYNTHROID ) 175 MCG tablet Take 1 tablet by mouth once daily   metFORMIN  (GLUCOPHAGE -XR) 500 MG 24 hr tablet Take 2 tablets (1,000 mg total) by mouth 2 (two) times daily with a meal.   pioglitazone  (ACTOS ) 45 MG tablet Take 45 mg by mouth daily.   rosuvastatin  (CRESTOR ) 20 MG tablet Take 1 tablet (20 mg total) by mouth daily.   [  DISCONTINUED] VENLAFAXINE  HCL ER PO Take 1 tablet by mouth daily.   [DISCONTINUED] benzonatate  (TESSALON ) 100 MG capsule Take 1 capsule (100 mg total) by mouth 2 (two) times daily as needed for cough.   [DISCONTINUED] BUSPIRONE  HCL PO Take by mouth.   [DISCONTINUED] cetirizine (ZYRTEC) 10 MG tablet Take 10 mg by mouth daily.   [DISCONTINUED] chlorpheniramine-HYDROcodone  (TUSSIONEX) 10-8 MG/5ML Take 5 mLs by mouth every 12 (twelve) hours as needed for cough.   [DISCONTINUED] dicyclomine  (BENTYL ) 20 MG tablet Take 1 tablet (20 mg total) by mouth every 8 (eight) hours as needed.   [DISCONTINUED] Ivermectin  0.5 % LOTN Apply up to 1 tube topically to dry hair, coating hair and scalp thoroughly; rinse with water after 10 minutes   [DISCONTINUED] lisinopril  (ZESTRIL ) 5 MG tablet Take 1 tablet (5 mg total) by mouth daily.   [DISCONTINUED] ondansetron  (ZOFRAN -ODT) 4 MG disintegrating  tablet Take 1 tablet (4 mg total) by mouth every 6 (six) hours as needed for nausea or vomiting.   [DISCONTINUED] pioglitazone  (ACTOS ) 45 MG tablet Take 1 tablet (45 mg total) by mouth daily.   [DISCONTINUED] predniSONE  (DELTASONE ) 10 MG tablet Take 1 tablet (10 mg total) by mouth daily. Day 1-2: Take 50 mg  ( 5 pills) Day 3-4 : Take 40 mg (4pills) Day 5-6: Take 30 mg (3 pills) Day 7-8:  Take 20 mg (2 pills) Day 9:  Take 10mg  (1 pill)   No facility-administered medications prior to visit.    Review of Systems All negative Except see HPI       Objective    BP (!) 143/91   Pulse 74   Temp 97.9 F (36.6 C) (Oral)   Resp 16   Ht 5' 5 (1.651 m)   Wt (!) 327 lb 9.6 oz (148.6 kg)   LMP 05/20/2024   SpO2 97%   BMI 54.52 kg/m     Physical Exam Vitals reviewed.  Constitutional:      Appearance: She is normal weight.  HENT:     Head: Normocephalic and atraumatic.     Right Ear: Ear canal and external ear normal.     Left Ear: Ear canal and external ear normal.     Nose: Congestion and rhinorrhea present.     Mouth/Throat:     Pharynx: Posterior oropharyngeal erythema present.     Comments: Postnasal drainage noted Eyes:     General: No scleral icterus.       Right eye: No discharge.        Left eye: No discharge.     Extraocular Movements: Extraocular movements intact.     Pupils: Pupils are equal, round, and reactive to light.  Cardiovascular:     Rate and Rhythm: Normal rate and regular rhythm.  Pulmonary:     Effort: Pulmonary effort is normal.     Breath sounds: Normal breath sounds.  Abdominal:     General: Abdomen is flat. Bowel sounds are normal.     Palpations: Abdomen is soft.  Lymphadenopathy:     Cervical: No cervical adenopathy.  Neurological:     Mental Status: She is alert.      No results found for any visits on 06/27/24.      Assessment & Plan Acute upper respiratory infection Likely viral etiology. Differential includes viral infection,  COVID-19, strep throat, and influenza. - Perform COVID-19, strep, and influenza tests. - Advised hydration and warm tea with honey if not allergic. - Recommended warm salt gargles with baking soda. -  Suggested Zicam or Cepacol for sore throat. - Continue Mucinex for cough. - Advised alternating acetaminophen  and ibuprofen  every 4-6 hours for pain. - Consider ENT referral if symptoms worsen or persist beyond four weeks. Will follow-up  Cough, unspecified type (Primary)  - POCT Influenza A/B - POC COVID-19 - POCT rapid strep A    Cold sore On pe - valACYclovir (VALTREX) 500 MG tablet; Take 1 tablet (500 mg total) by mouth 2 (two) times daily.  Dispense: 6 tablet; Refill: 0   No orders of the defined types were placed in this encounter.   No follow-ups on file.   The patient was advised to call back or seek an in-person evaluation if the symptoms worsen or if the condition fails to improve as anticipated.  I discussed the assessment and treatment plan with the patient. The patient was provided an opportunity to ask questions and all were answered. The patient agreed with the plan and demonstrated an understanding of the instructions.  I, Zan Triska, PA-C have reviewed all documentation for this visit. The documentation on 06/27/2024  for the exam, diagnosis, procedures, and orders are all accurate and complete.  Jolynn Spencer, [facility], MMS [facility] [phone] (phone) [phone] (fax)  [facility]

## 2024-07-18 ENCOUNTER — Other Ambulatory Visit: Payer: Self-pay

## 2024-07-18 ENCOUNTER — Telehealth: Payer: Self-pay | Admitting: Family Medicine

## 2024-07-18 NOTE — Telephone Encounter (Signed)
 Converted

## 2024-07-18 NOTE — Telephone Encounter (Signed)
 [facility] faxed refill request for the following medications:  metFORMIN (GLUCOPHAGE-XR) 500 MG 24 hr tablet   Please advise.

## 2024-08-01 ENCOUNTER — Telehealth: Payer: Self-pay | Admitting: Family Medicine

## 2024-08-01 NOTE — Telephone Encounter (Signed)
[facility] faxed refill request for the following medications:   amLODipine (NORVASC) 10 MG tablet   Please advise.  

## 2024-08-02 ENCOUNTER — Other Ambulatory Visit: Payer: Self-pay | Admitting: Family Medicine

## 2024-08-02 ENCOUNTER — Other Ambulatory Visit: Payer: Self-pay

## 2024-08-02 ENCOUNTER — Ambulatory Visit: Payer: Self-pay

## 2024-08-02 DIAGNOSIS — J4520 Mild intermittent asthma, uncomplicated: Secondary | ICD-10-CM

## 2024-08-02 DIAGNOSIS — E039 Hypothyroidism, unspecified: Secondary | ICD-10-CM

## 2024-08-02 DIAGNOSIS — E1165 Type 2 diabetes mellitus with hyperglycemia: Secondary | ICD-10-CM

## 2024-08-02 DIAGNOSIS — E1159 Type 2 diabetes mellitus with other circulatory complications: Secondary | ICD-10-CM

## 2024-08-02 DIAGNOSIS — N270 Small kidney, unilateral: Secondary | ICD-10-CM

## 2024-08-02 DIAGNOSIS — Z6841 Body Mass Index (BMI) 40.0 and over, adult: Secondary | ICD-10-CM

## 2024-08-02 DIAGNOSIS — F39 Unspecified mood [affective] disorder: Secondary | ICD-10-CM

## 2024-08-02 MED ORDER — CETIRIZINE HCL 10 MG PO TABS: 10.0000 mg | ORAL_TABLET | Freq: Every day | ORAL | 0 refills | Status: AC

## 2024-08-02 MED ORDER — PIOGLITAZONE HCL 45 MG PO TABS: 45.0000 mg | ORAL_TABLET | Freq: Every day | ORAL | 0 refills | Status: AC

## 2024-08-02 MED ORDER — AMLODIPINE BESYLATE 10 MG PO TABS: 10.0000 mg | ORAL_TABLET | Freq: Every day | ORAL | 0 refills | Status: AC

## 2024-08-02 MED ORDER — ROSUVASTATIN CALCIUM 20 MG PO TABS: 20.0000 mg | ORAL_TABLET | Freq: Every day | ORAL | 0 refills | Status: AC

## 2024-08-02 MED ORDER — LEVOTHYROXINE SODIUM 175 MCG PO TABS: 175.0000 ug | ORAL_TABLET | Freq: Every day | ORAL | 0 refills | Status: AC

## 2024-08-02 MED ORDER — METFORMIN HCL ER 500 MG PO TB24: 1000.0000 mg | ORAL_TABLET | Freq: Two times a day (BID) | ORAL | 0 refills | Status: AC

## 2024-08-02 NOTE — Telephone Encounter (Signed)
 Converted to rx request

## 2024-08-02 NOTE — Telephone Encounter (Signed)
 FYI Only or Action Required?: Action required by provider: medication refill request. Zyrtec , synthroid , metformin , actos , crestor . See separate refill request.  Patient was last seen in primary care on 06/27/2024 by Ostwalt, Janna, PA-C.  Called Nurse Triage reporting Headache and Medication Refill.  Symptoms began several days ago.  Interventions attempted: Rest, hydration, or home remedies.  Symptoms are: stable.  Triage Disposition: Call PCP When Office is Open  Patient/caregiver understands and will follow disposition?: Yes  Copied from CRM #[id number]. Topic: Clinical - Red Word Triage >> Aug 02, 2024 12:27 PM Larissa S wrote: [facility] that prompted transfer to Nurse Triage: headaches, depression, needs refills.    ----------------------------------------------------------------------- From previous Reason for Contact - Scheduling: Patient/patient representative is calling to schedule an appointment. Refer to attachments for appointment information. Reason for Disposition  [1] Prescription refill request for NON-ESSENTIAL medicine (i.e., no harm to patient if med not taken) AND [2] triager unable to refill per department policy  Similar to previously diagnosed migraine headaches  Answer Assessment - Initial Assessment Questions Pr reports being out of several medications for the past 4 days including amlodipine , zyrtec , synthroid , metformin , actos  and crestor . Amlodipine  filled today, pt plans to pick it up and resume taking. Refill requests not submitted for other medications. Pt reports 4/10 headache with mild intermittent dizziness since missing amlodipine . Denies CP or SOB or changes to speech or vision. Pt not sure what her BP currently is and that her BP cuff is in her house somewhere and would need to find it. Advised home care for headache. Advised pt that if her SBP is >160 or DBP >100 after having not taken her amlodipine , or if it remains above 130/80 while on amlodipine  to  please call office back to notify. Sending refill requests to office.  1. DRUG NAME: What medicine do you need to have refilled?     Needing zyrtec , synthroid , metformin , actos  and crestor   2. REFILLS REMAINING: How many refills are remaining? Notes: The label on the medicine or pill bottle will show how many refills are remaining. If there are no refills remaining, then a renewal may be needed.     0  3. EXPIRATION DATE: What is the expiration date? Note: The label states when the prescription will expire, and thus can no longer be refilled.)     *No Answer*  4. PRESCRIBER: Who prescribed it? Note: The prescribing doctor or group is responsible for refill approvals..     Dr. Lauraine Buoy, Emilio Kelly DASEN, FNP, and historical provider  5. PHARMACY: Have you contacted your pharmacy (drugstore)? Note: Some pharmacies will contact the doctor (or NP/PA).      [facility] [address], [place] - [address]  6. SYMPTOMS: Do you have any symptoms?     4/10 headache, pt reports she gets headache when she misses her amlodipine . Has been out of all meds including amlodipine  for 4 days, amlodipine  refilled today. Pt unsure what her BP is, cuff not currently available and states she will have to look for it.  7. PREGNANCY: Is there any chance that you are pregnant? When was your last menstrual period?     Denies  Answer Assessment - Initial Assessment Questions 1. LOCATION: Where does it hurt?      Back right side of head  2. ONSET: When did the headache start? (e.g., minutes, hours, days)      Got worse 4 days ago  3. PATTERN: Does the pain come and go,  or has it been constant since it started?     Comes and goes  4. SEVERITY: How bad is the pain? and What does it keep you from doing?  (e.g., Scale 1-10; mild, moderate, or severe)     4/10  5. RECURRENT SYMPTOM: Have you ever had headaches before? If Yes, ask: When was the last time? and What happened  that time?      Has hx of headaches, get worse when running out of BP meds.  6. CAUSE: What do you think is causing the headache?     BP might be high  7. MIGRAINE: Have you been diagnosed with migraine headaches? If Yes, ask: Is this headache similar?      Yes, current symptoms feel similar  8. HEAD INJURY: Has there been any recent injury to your head?      Denies  9. OTHER SYMPTOMS: Do you have any other symptoms? (e.g., fever, stiff neck, eye pain, sore throat, cold symptoms)     Denies  10. PREGNANCY: Is there any chance you are pregnant? When was your last menstrual period?       Denies, LMP about a month ago.  Protocols used: Headache-A-AH, Medication Refill and Renewal Call-A-AH

## 2024-08-02 NOTE — Telephone Encounter (Signed)
Sent medication refills to preferred pharmacy

## 2024-08-08 ENCOUNTER — Telehealth: Payer: Self-pay | Admitting: Family Medicine

## 2024-08-08 NOTE — Telephone Encounter (Signed)
 We have received a refill authorization request. Please send in a refill for Rosuvastin 20mg  tab quantity: 90. Per the request we have received through [employer]. Please send to [facility] on [address].

## 2024-09-19 ENCOUNTER — Emergency Department

## 2024-09-19 ENCOUNTER — Encounter: Payer: Self-pay | Admitting: *Deleted

## 2024-09-19 ENCOUNTER — Other Ambulatory Visit: Payer: Self-pay

## 2024-09-19 ENCOUNTER — Emergency Department
Admission: EM | Admit: 2024-09-19 | Discharge: 2024-09-19 | Disposition: A | Discharge status: Home / Self Care | Attending: Emergency Medicine | Admitting: Emergency Medicine

## 2024-09-19 DIAGNOSIS — R112 Nausea with vomiting, unspecified: Secondary | ICD-10-CM | POA: Insufficient documentation

## 2024-09-19 DIAGNOSIS — R1013 Epigastric pain: Secondary | ICD-10-CM | POA: Diagnosis present

## 2024-09-19 DIAGNOSIS — Z9049 Acquired absence of other specified parts of digestive tract: Secondary | ICD-10-CM | POA: Insufficient documentation

## 2024-09-19 DIAGNOSIS — Z9884 Bariatric surgery status: Secondary | ICD-10-CM | POA: Insufficient documentation

## 2024-09-19 DIAGNOSIS — E119 Type 2 diabetes mellitus without complications: Secondary | ICD-10-CM | POA: Diagnosis not present

## 2024-09-19 LAB — URINALYSIS, ROUTINE W REFLEX MICROSCOPIC
Bilirubin Urine: NEGATIVE
Glucose, UA: NEGATIVE mg/dL
Hgb urine dipstick: NEGATIVE
Ketones, ur: 5 mg/dL — AB
Leukocytes,Ua: NEGATIVE
Nitrite: NEGATIVE
Protein, ur: 100 mg/dL — AB
Specific Gravity, Urine: 1.03 (ref 1.005–1.030)
pH: 5 (ref 5.0–8.0)

## 2024-09-19 LAB — COMPREHENSIVE METABOLIC PANEL WITH GFR
ALT: 24 U/L (ref 0–44)
AST: 20 U/L (ref 15–41)
Albumin: 4 g/dL (ref 3.5–5.0)
Alkaline Phosphatase: 89 U/L (ref 38–126)
Anion gap: 12 (ref 5–15)
BUN: 12 mg/dL (ref 6–20)
CO2: 22 mmol/L (ref 22–32)
Calcium: 9.7 mg/dL (ref 8.9–10.3)
Chloride: 106 mmol/L (ref 98–111)
Creatinine, Ser: 0.74 mg/dL (ref 0.44–1.00)
GFR, Estimated: >60 mL/min
Glucose, Bld: 132 mg/dL — ABNORMAL HIGH (ref 70–99)
Potassium: 3.8 mmol/L (ref 3.5–5.1)
Sodium: 140 mmol/L (ref 135–145)
Total Bilirubin: 0.9 mg/dL (ref 0.0–1.2)
Total Protein: 6.9 g/dL (ref 6.5–8.1)

## 2024-09-19 LAB — LIPASE, BLOOD: Lipase: 23 U/L (ref 11–51)

## 2024-09-19 LAB — CBC
HCT: 40.8 % (ref 36.0–46.0)
Hemoglobin: 14.1 g/dL (ref 12.0–15.0)
MCH: 27.7 pg (ref 26.0–34.0)
MCHC: 34.6 g/dL (ref 30.0–36.0)
MCV: 80.2 fL (ref 80.0–100.0)
Platelets: 304 K/uL (ref 150–400)
RBC: 5.09 MIL/uL (ref 3.87–5.11)
RDW: 13.2 % (ref 11.5–15.5)
WBC: 8 K/uL (ref 4.0–10.5)
nRBC: 0 % (ref 0.0–0.2)

## 2024-09-19 LAB — POC URINE PREG, ED: Preg Test, Ur: NEGATIVE

## 2024-09-19 MED ORDER — SODIUM CHLORIDE 0.9 % IV BOLUS
500.0000 mL | Freq: Once | INTRAVENOUS | Status: AC
  Administered 2024-09-19: 500 mL via INTRAVENOUS

## 2024-09-19 MED ORDER — METOCLOPRAMIDE HCL 10 MG PO TABS: 10.0000 mg | ORAL_TABLET | Freq: Three times a day (TID) | ORAL | 0 refills | Status: AC

## 2024-09-19 MED ORDER — IOHEXOL 350 MG/ML SOLN
100.0000 mL | Freq: Once | INTRAVENOUS | Status: AC | PRN
  Administered 2024-09-19: 100 mL via INTRAVENOUS

## 2024-09-19 MED ORDER — METOCLOPRAMIDE HCL 5 MG/ML IJ SOLN
10.0000 mg | Freq: Once | INTRAMUSCULAR | Status: AC
  Administered 2024-09-19: 10 mg via INTRAVENOUS
  Filled 2024-09-19: qty 2

## 2024-09-19 MED ORDER — PANTOPRAZOLE SODIUM 40 MG IV SOLR
40.0000 mg | Freq: Once | INTRAVENOUS | Status: AC
  Administered 2024-09-19: 40 mg via INTRAVENOUS
  Filled 2024-09-19: qty 10

## 2024-09-19 MED ORDER — OMEPRAZOLE MAGNESIUM 20 MG PO TBEC: 20.0000 mg | DELAYED_RELEASE_TABLET | Freq: Every day | ORAL | 1 refills | Status: AC

## 2024-09-19 NOTE — ED Notes (Signed)
 2 Attempts made to start pts IV.  Once in her RFA and once in her Left Hand, w/o success.  Attending ER MD made aware.  ED charge RN made aware of IV start difficulties w/ pt and that a consult to the [facility] IV Team had been placed.

## 2024-09-19 NOTE — Discharge Instructions (Signed)
 You are seen in the emergency department for upper abdominal pain with nausea and vomiting.  You had a CT scan done and lab work done that was overall normal.  You do not have any findings to explain your pain today.  You were started on an acid reducing medication called omeprazole  you should take this daily.  Eat small frequent meals throughout the day.  Do not lay down for at least 1 hour after eating.  You are started on a prescription for nausea medicine called Reglan , you can take this 3 times a day.  You are given information to follow-up with gastroenterology, call tomorrow to schedule an appointment.  Return to the emergency department if you have any ongoing or worsening symptoms.  Call your primary care physician tomorrow for close follow-up.

## 2024-09-19 NOTE — ED Triage Notes (Signed)
 Pt ambulatory to triage.  Pt has abd pain with diarrhea.  Decreased appetite.  Pt alert.

## 2024-09-19 NOTE — ED Provider Notes (Signed)
 "  [facility] Provider Note    Event Date/Time   First MD Initiated Contact with Patient 09/19/24 1731     (approximate)   History   Abdominal Pain   HPI  Leah Bright is a 34 y.o. female past medical history significant for gastric sleeve, prior cholecystectomy, diabetes who presents to the emergency department with abdominal pain.  Patient states that she has been having significant upper abdominal pain, worse after eating and gets a sharp stabbing pain has had multiple episodes of nausea vomiting.  Denies any blood in her stool or melanotic stool.  Intermittently has lower abdominal pain.  States that he had an endoscopy in 2014 that showed burn marks in her esophagus and had to have her esophagus stretched.  States that she had a cholecystectomy in 2014.  Initial gastric sleeve that was part of the study in [place]  was in 2011 and then 2014.  Denies any dysuria, urinary urgency or frequency.  Denies any alcohol use or marijuana use.     Physical Exam   Triage Vital Signs: ED Triage Vitals  Encounter Vitals Group     BP 09/19/24 1653 (!) 135/97     Girls Systolic BP Percentile --      Girls Diastolic BP Percentile --      Boys Systolic BP Percentile --      Boys Diastolic BP Percentile --      Pulse Rate 09/19/24 1653 80     Resp 09/19/24 1653 18     Temp 09/19/24 1653 98.3 F (36.8 C)     Temp Source 09/19/24 1653 Oral     SpO2 09/19/24 1653 98 %     Weight 09/19/24 1651 (!) 310 lb (140.6 kg)     Height 09/19/24 1651 5' 6 (1.676 m)     Head Circumference --      Peak Flow --      Pain Score 09/19/24 1651 6     Pain Loc --      Pain Education --      Exclude from Growth Chart --     Most recent vital signs: Vitals:   09/19/24 2015 09/19/24 2218  BP: (!) 145/81 138/74  Pulse: 81 79  Resp: 18 18  Temp: 98.4 F (36.9 C) 98.3 F (36.8 C)  SpO2: 98% 98%    Physical Exam Constitutional:      Appearance: She is  well-developed.  HENT:     Head: Atraumatic.  Eyes:     Conjunctiva/sclera: Conjunctivae normal.  Cardiovascular:     Rate and Rhythm: Regular rhythm.  Pulmonary:     Effort: No respiratory distress.  Abdominal:     General: There is no distension.     Tenderness: There is abdominal tenderness in the epigastric area. There is no guarding or rebound. Negative signs include McBurney's sign.  Musculoskeletal:        General: Normal range of motion.     Cervical back: Normal range of motion.  Skin:    General: Skin is warm.  Neurological:     Mental Status: She is alert. Mental status is at baseline.     IMPRESSION / MDM / ASSESSMENT AND PLAN / ED COURSE  I reviewed the triage vital signs and the nursing notes.  Differential diagnosis including gastritis/PUD, esophagitis, complications of gastric sleeve, gastroparesis, choledocholithiasis, pancreatitis, achalasia  EKG  I, Clotilda Punter, the attending physician, personally viewed and interpreted this ECG.  Normal sinus  rhythm.  Normal intervals.  No chamber enlargement.  No significant ST elevation or depression.  Normal intervals.  No tachycardic or bradycardic dysrhythmias while on cardiac telemetry.  RADIOLOGY I independently reviewed imaging, my interpretation of imaging: CT scan abdomen and pelvis with no signs of a bowel obstruction.  Read as no acute findings.  LABS (all labs ordered are listed, but only abnormal results are displayed) Labs interpreted as -    Labs Reviewed  COMPREHENSIVE METABOLIC PANEL WITH GFR - Abnormal; Notable for the following components:      Result Value   Glucose, Bld 132 (*)    All other components within normal limits  URINALYSIS, ROUTINE W REFLEX MICROSCOPIC - Abnormal; Notable for the following components:   Color, Urine YELLOW (*)    APPearance HAZY (*)    Ketones, ur 5 (*)    Protein, ur 100 (*)    Bacteria, UA RARE (*)    All other components within normal limits  LIPASE,  BLOOD  CBC  POC URINE PREG, ED     MDM  Lab work with no significant leukocytosis.  Creatinine at baseline with no significant electrolyte abnormality.  Glucose normal at 132.  Normal lipase.  Urine with no signs of urinary tract infection.  Pregnancy test is negative.  Difficult IV stick requiring ultrasound-guided IV  Given IV Protonix , IV Reglan   On reevaluation had some improvement of her symptoms.  Able to tolerate p.o.  CT scan of the abdomen pelvis with no acute findings.  Will start the patient on a PPI.  Given Reglan  given concern for possible gastroparesis.  Discussed follow-up with gastroenterology and given information to follow-up with gastroenterologist.  No questions or concerns at time of discharge.  Discussed return precautions for any ongoing or worsening symptoms.     PROCEDURES:  Critical Care performed: No  Procedures  Patient's presentation is most consistent with acute presentation with potential threat to life or bodily function.   MEDICATIONS ORDERED IN ED: Medications  sodium chloride  0.9 % bolus 500 mL (0 mLs Intravenous Stopped 09/19/24 2200)  pantoprazole  (PROTONIX ) injection 40 mg (40 mg Intravenous Given 09/19/24 2108)  metoCLOPramide  (REGLAN ) injection 10 mg (10 mg Intravenous Given 09/19/24 2110)  iohexol  (OMNIPAQUE ) 350 MG/ML injection 100 mL (100 mLs Intravenous Contrast Given 09/19/24 2115)    FINAL CLINICAL IMPRESSION(S) / ED DIAGNOSES   Final diagnoses:  Epigastric pain  Nausea and vomiting, unspecified vomiting type     Rx / DC Orders   ED Discharge Orders          Ordered    metoCLOPramide  (REGLAN ) 10 MG tablet  3 times daily with meals        09/19/24 2153    omeprazole  (PRILOSEC  OTC) 20 MG tablet  Daily        09/19/24 2153             Note:  This document was prepared using Dragon voice recognition software and may include unintentional dictation errors.   Suzanne Kirsch, MD 09/19/24 2333  "

## 2024-09-19 NOTE — ED Notes (Signed)
 Attempted IV in left forearm w/ out success. Alternate RN to attempt.

## 2024-09-19 NOTE — ED Notes (Signed)
 Alternate RN attempted IV w/ out success.

## 2024-10-04 ENCOUNTER — Ambulatory Visit
Admission: RE | Admit: 2024-10-04 | Discharge: 2024-10-04 | Disposition: A | Attending: Gastroenterology | Admitting: Gastroenterology

## 2024-10-04 ENCOUNTER — Encounter: Admission: RE | Disposition: A | Payer: Self-pay | Source: Home / Self Care | Attending: Gastroenterology

## 2024-10-04 ENCOUNTER — Encounter: Payer: Self-pay | Admitting: Gastroenterology

## 2024-10-04 ENCOUNTER — Ambulatory Visit: Admitting: Certified Registered"

## 2024-10-04 ENCOUNTER — Other Ambulatory Visit: Payer: Self-pay

## 2024-10-04 DIAGNOSIS — K2970 Gastritis, unspecified, without bleeding: Secondary | ICD-10-CM | POA: Insufficient documentation

## 2024-10-04 DIAGNOSIS — R12 Heartburn: Secondary | ICD-10-CM | POA: Insufficient documentation

## 2024-10-04 DIAGNOSIS — E119 Type 2 diabetes mellitus without complications: Secondary | ICD-10-CM | POA: Insufficient documentation

## 2024-10-04 DIAGNOSIS — N261 Atrophy of kidney (terminal): Secondary | ICD-10-CM | POA: Insufficient documentation

## 2024-10-04 DIAGNOSIS — R1310 Dysphagia, unspecified: Secondary | ICD-10-CM | POA: Insufficient documentation

## 2024-10-04 DIAGNOSIS — K3189 Other diseases of stomach and duodenum: Secondary | ICD-10-CM | POA: Insufficient documentation

## 2024-10-04 DIAGNOSIS — K219 Gastro-esophageal reflux disease without esophagitis: Secondary | ICD-10-CM | POA: Insufficient documentation

## 2024-10-04 DIAGNOSIS — I10 Essential (primary) hypertension: Secondary | ICD-10-CM | POA: Insufficient documentation

## 2024-10-04 DIAGNOSIS — Z7984 Long term (current) use of oral hypoglycemic drugs: Secondary | ICD-10-CM | POA: Insufficient documentation

## 2024-10-04 DIAGNOSIS — R1314 Dysphagia, pharyngoesophageal phase: Secondary | ICD-10-CM | POA: Insufficient documentation

## 2024-10-04 MED ORDER — LIDOCAINE HCL (CARDIAC) PF 100 MG/5ML IV SOSY
PREFILLED_SYRINGE | INTRAVENOUS | Status: DC | PRN
  Administered 2024-10-04: 50 mg via INTRAVENOUS

## 2024-10-04 MED ORDER — SODIUM CHLORIDE 0.9 % IV SOLN: INTRAVENOUS | Status: DC

## 2024-10-04 MED ORDER — PROPOFOL 10 MG/ML IV BOLUS
INTRAVENOUS | Status: DC | PRN
  Administered 2024-10-04: 40 mg via INTRAVENOUS
  Administered 2024-10-04: 20 mg via INTRAVENOUS
  Administered 2024-10-04: 30 mg via INTRAVENOUS
  Administered 2024-10-04: 40 mg via INTRAVENOUS
  Administered 2024-10-04: 30 mg via INTRAVENOUS
  Administered 2024-10-04: 60 mg via INTRAVENOUS
  Administered 2024-10-04: 20 mg via INTRAVENOUS
  Administered 2024-10-04: 40 mg via INTRAVENOUS

## 2024-10-04 MED ORDER — DEXMEDETOMIDINE HCL IN NACL 80 MCG/20ML IV SOLN
INTRAVENOUS | Status: DC | PRN
  Administered 2024-10-04: 8 ug via INTRAVENOUS
  Administered 2024-10-04: 12 ug via INTRAVENOUS

## 2024-10-04 NOTE — Anesthesia Preprocedure Evaluation (Signed)
"                                    Anesthesia Evaluation  Patient identified by MRN, date of birth, ID band Patient awake    Reviewed: Allergy & Precautions, H&P , NPO status , Patient's Chart, lab work & pertinent test results, reviewed documented beta blocker date and time   History of Anesthesia Complications Negative for: history of anesthetic complications  Airway Mallampati: II  TM Distance: >3 FB Neck ROM: full    Dental  (+) Dental Advidsory Given, Caps, Missing, Teeth Intact Permanent retainer on the bottom.:   Pulmonary neg shortness of breath, asthma , neg sleep apnea, neg COPD, neg recent URI   Pulmonary exam normal breath sounds clear to auscultation       Cardiovascular Exercise Tolerance: Good hypertension, (-) angina (-) Past MI and (-) Cardiac Stents Normal cardiovascular exam(-) dysrhythmias (-) Valvular Problems/Murmurs Rhythm:regular Rate:Normal     Neuro/Psych negative neurological ROS  negative psych ROS   GI/Hepatic Neg liver ROS,GERD  ,,  Endo/Other  diabetesHypothyroidism  Class 4 obesity  Renal/GU Renal disease (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)  negative genitourinary   Musculoskeletal   Abdominal   Peds  Hematology negative hematology ROS (+)   Anesthesia Other Findings Past Medical History: No date: Diabetes mellitus without complication (HCC)   Reproductive/Obstetrics negative OB ROS                              Anesthesia Physical Anesthesia Plan  ASA: 3  Anesthesia Plan: General   Post-op Pain Management:    Induction: Intravenous  PONV Risk Score and Plan: 3 and Propofol  infusion, TIVA and Treatment may vary due to age or medical condition  Airway Management Planned: Natural Airway and Nasal Cannula  Additional Equipment:   Intra-op Plan:   Post-operative Plan:   Informed Consent: I have reviewed the patients History and Physical, chart, labs and discussed  the procedure including the risks, benefits and alternatives for the proposed anesthesia with the patient or authorized representative who has indicated his/her understanding and acceptance.     Dental Advisory Given  Plan Discussed with: Anesthesiologist, CRNA and Surgeon  Anesthesia Plan Comments:         Anesthesia Quick Evaluation  "

## 2024-10-04 NOTE — Transfer of Care (Signed)
 Immediate Anesthesia Transfer of Care Note  Patient: Leah Bright  Procedure(s) Performed: EGD (ESOPHAGOGASTRODUODENOSCOPY) BIOPSY,TISSUE  Patient Location: PACU and Endoscopy Unit  Anesthesia Type:General  Level of Consciousness: drowsy and patient cooperative  Airway & Oxygen Therapy: Patient Spontanous Breathing  Post-op Assessment: Report given to RN and Post -op Vital signs reviewed and stable  Post vital signs: Reviewed and stable  Last Vitals:  Vitals Value Taken Time  BP    Temp    Pulse    Resp    SpO2      Last Pain:  Vitals:   10/04/24 0847  TempSrc: Temporal  PainSc: 0-No pain         Complications: No notable events documented.

## 2024-10-04 NOTE — Op Note (Signed)
 [facility] Patient Name: Leah Bright Procedure Date: 10/04/2024 9:01 AM MRN: [id number] Account #: [id number] Date of Birth: Nov 01, 1990 Admit Type: Outpatient Age: 34 Room: [facility] ENDO ROOM 2 Gender: Female Note Status: Finalized Instrument Name: Upper GI Scope [id number] Procedure:             Upper GI endoscopy Indications:           Dysphagia, Heartburn Providers:             Leah Jess Brooklyn MD, MD Referring MD:          Leah Jess Brooklyn MD, MD (Referring MD), Lauraine GEANNIE Buoy (Referring MD) Medicines:             General Anesthesia Complications:         No immediate complications. Estimated blood loss: None. Procedure:             Pre-Anesthesia Assessment:                        - Prior to the procedure, a History and Physical was                         performed, and patient medications and allergies were                         reviewed. The patient is competent. The risks and                         benefits of the procedure and the sedation options and                         risks were discussed with the patient. All questions                         were answered and informed consent was obtained.                         Patient identification and proposed procedure were                         verified by the physician, the nurse, the                         anesthesiologist, the anesthetist and the technician                         in the pre-procedure area in the procedure room in the                         endoscopy suite. Mental Status Examination: alert and                         oriented. Airway Examination: normal oropharyngeal                         airway and neck mobility. Respiratory Examination:  clear to auscultation. CV Examination: normal.                         Prophylactic Antibiotics: The patient does not require                         prophylactic antibiotics.  Prior Anticoagulants: The                         patient has taken no anticoagulant or antiplatelet                         agents. ASA Grade Assessment: III - A patient with                         severe systemic disease. After reviewing the risks and                         benefits, the patient was deemed in satisfactory                         condition to undergo the procedure. The anesthesia                         plan was to use general anesthesia. Immediately prior                         to administration of medications, the patient was                         re-assessed for adequacy to receive sedatives. The                         heart rate, respiratory rate, oxygen saturations,                         blood pressure, adequacy of pulmonary ventilation, and                         response to care were monitored throughout the                         procedure. The physical status of the patient was                         re-assessed after the procedure.                        After obtaining informed consent, the endoscope was                         passed under direct vision. Throughout the procedure,                         the patient's blood pressure, pulse, and oxygen                         saturations were monitored continuously. The Endoscope  was introduced through the mouth, and advanced to the                         second part of duodenum. The upper GI endoscopy was                         accomplished without difficulty. The patient tolerated                         the procedure well. Findings:      The duodenal bulb and second portion of the duodenum were normal.      Patchy mildly erythematous mucosa without bleeding was found in the       gastric body and in the gastric antrum. Biopsies were taken with a cold       forceps for Helicobacter pylori testing.      The cardia and gastric fundus were normal on retroflexion.       Esophagogastric landmarks were identified: the gastroesophageal junction       was found at 35 cm from the incisors.      The gastroesophageal junction and examined esophagus were normal.       Biopsies were taken with a cold forceps for histology. Estimated blood       loss: none. Impression:            - Normal duodenal bulb and second portion of the                         duodenum.                        - Erythematous mucosa in the gastric body and antrum.                         Biopsied.                        - Esophagogastric landmarks identified.                        - Normal gastroesophageal junction and esophagus.                         Biopsied. Recommendation:        - Discharge patient to home (with escort).                        - Resume previous diet today.                        - Continue present medications.                        - Await pathology results. Procedure Code(s):     --- Professional ---                        [phone], Esophagogastroduodenoscopy, flexible,                         transoral; with biopsy, single or multiple Diagnosis Code(s):     --- Professional ---  K31.89, Other diseases of stomach and duodenum                        R13.10, Dysphagia, unspecified                        R12, Heartburn CPT copyright 2022 [employer]. All rights reserved. The codes documented in this report are preliminary and upon coder review may  be revised to meet current compliance requirements. Dr. Corinn Bright Leah Jess Brooklyn MD, MD 10/04/2024 9:20:07 AM This report has been signed electronically. Number of Addenda: 0 Note Initiated On: 10/04/2024 9:01 AM Estimated Blood Loss:  Estimated blood loss: none.      [facility]

## 2024-10-05 LAB — SURGICAL PATHOLOGY

## 2024-10-07 ENCOUNTER — Ambulatory Visit: Payer: Self-pay | Admitting: Gastroenterology
